# Patient Record
Sex: Female | Born: 1950 | Race: White | Hispanic: No | Marital: Married | Smoking: Current every day smoker
Health system: Southern US, Community
[De-identification: ages and names within clinical notes are randomized; demographics above are authoritative.]

## PROBLEM LIST (undated history)

## (undated) DIAGNOSIS — Z8739 Personal history of other diseases of the musculoskeletal system and connective tissue: Secondary | ICD-10-CM

## (undated) DIAGNOSIS — M1990 Unspecified osteoarthritis, unspecified site: Secondary | ICD-10-CM

## (undated) DIAGNOSIS — R195 Other fecal abnormalities: Secondary | ICD-10-CM

## (undated) DIAGNOSIS — F419 Anxiety disorder, unspecified: Secondary | ICD-10-CM

## (undated) DIAGNOSIS — F410 Panic disorder [episodic paroxysmal anxiety] without agoraphobia: Secondary | ICD-10-CM

## (undated) DIAGNOSIS — G43909 Migraine, unspecified, not intractable, without status migrainosus: Secondary | ICD-10-CM

## (undated) DIAGNOSIS — F32A Depression, unspecified: Secondary | ICD-10-CM

## (undated) DIAGNOSIS — I10 Essential (primary) hypertension: Secondary | ICD-10-CM

## (undated) DIAGNOSIS — L4050 Arthropathic psoriasis, unspecified: Secondary | ICD-10-CM

## (undated) DIAGNOSIS — L438 Other lichen planus: Secondary | ICD-10-CM

## (undated) DIAGNOSIS — J309 Allergic rhinitis, unspecified: Secondary | ICD-10-CM

## (undated) DIAGNOSIS — H9319 Tinnitus, unspecified ear: Secondary | ICD-10-CM

## (undated) DIAGNOSIS — A048 Other specified bacterial intestinal infections: Secondary | ICD-10-CM

## (undated) DIAGNOSIS — F329 Major depressive disorder, single episode, unspecified: Secondary | ICD-10-CM

## (undated) DIAGNOSIS — J342 Deviated nasal septum: Secondary | ICD-10-CM

## (undated) DIAGNOSIS — E785 Hyperlipidemia, unspecified: Secondary | ICD-10-CM

## (undated) DIAGNOSIS — M0520 Rheumatoid vasculitis with rheumatoid arthritis of unspecified site: Secondary | ICD-10-CM

## (undated) DIAGNOSIS — I219 Acute myocardial infarction, unspecified: Secondary | ICD-10-CM

## (undated) DIAGNOSIS — Z87898 Personal history of other specified conditions: Secondary | ICD-10-CM

## (undated) DIAGNOSIS — L409 Psoriasis, unspecified: Secondary | ICD-10-CM

## (undated) DIAGNOSIS — M810 Age-related osteoporosis without current pathological fracture: Secondary | ICD-10-CM

## (undated) DIAGNOSIS — T7840XA Allergy, unspecified, initial encounter: Secondary | ICD-10-CM

## (undated) HISTORY — DX: Unspecified osteoarthritis, unspecified site: M19.90

## (undated) HISTORY — DX: Hyperlipidemia, unspecified: E78.5

## (undated) HISTORY — DX: Migraine, unspecified, not intractable, without status migrainosus: G43.909

## (undated) HISTORY — DX: Rheumatoid vasculitis with rheumatoid arthritis of unspecified site: M05.20

## (undated) HISTORY — DX: Anxiety disorder, unspecified: F41.9

## (undated) HISTORY — DX: Other specified bacterial intestinal infections: A04.8

## (undated) HISTORY — DX: Arthropathic psoriasis, unspecified: L40.50

## (undated) HISTORY — DX: Allergy, unspecified, initial encounter: T78.40XA

## (undated) HISTORY — DX: Major depressive disorder, single episode, unspecified: F32.9

## (undated) HISTORY — DX: Personal history of other specified conditions: Z87.898

## (undated) HISTORY — DX: Essential (primary) hypertension: I10

## (undated) HISTORY — PX: CARDIAC CATHETERIZATION: SHX172

## (undated) HISTORY — DX: Panic disorder (episodic paroxysmal anxiety): F41.0

## (undated) HISTORY — DX: Age-related osteoporosis without current pathological fracture: M81.0

## (undated) HISTORY — PX: APPENDECTOMY: SHX54

## (undated) HISTORY — DX: Other fecal abnormalities: R19.5

## (undated) HISTORY — DX: Depression, unspecified: F32.A

## (undated) HISTORY — PX: BREAST BIOPSY: SHX20

## (undated) HISTORY — DX: Personal history of other diseases of the musculoskeletal system and connective tissue: Z87.39

## (undated) HISTORY — PX: ABDOMINOPLASTY: SUR9

## (undated) HISTORY — DX: Hemochromatosis, unspecified: E83.119

---

## 1981-06-17 HISTORY — PX: ABDOMINAL HYSTERECTOMY: SHX81

## 2000-06-17 HISTORY — PX: RHINOPLASTY: SUR1284

## 2004-07-31 DIAGNOSIS — L409 Psoriasis, unspecified: Secondary | ICD-10-CM | POA: Insufficient documentation

## 2004-09-25 ENCOUNTER — Ambulatory Visit: Payer: Self-pay | Admitting: Family Medicine

## 2004-11-19 DIAGNOSIS — J309 Allergic rhinitis, unspecified: Secondary | ICD-10-CM | POA: Insufficient documentation

## 2004-12-07 DIAGNOSIS — G43909 Migraine, unspecified, not intractable, without status migrainosus: Secondary | ICD-10-CM | POA: Insufficient documentation

## 2006-03-17 HISTORY — PX: COLONOSCOPY: SHX174

## 2006-03-27 ENCOUNTER — Ambulatory Visit: Payer: Self-pay | Admitting: Gastroenterology

## 2006-03-27 LAB — HM COLONOSCOPY: HM COLON: NORMAL

## 2006-06-17 HISTORY — PX: UPPER GI ENDOSCOPY: SHX6162

## 2006-07-22 ENCOUNTER — Ambulatory Visit: Payer: Self-pay | Admitting: Family Medicine

## 2006-11-05 ENCOUNTER — Ambulatory Visit: Payer: Self-pay | Admitting: Gastroenterology

## 2006-11-05 DIAGNOSIS — B9681 Helicobacter pylori [H. pylori] as the cause of diseases classified elsewhere: Secondary | ICD-10-CM | POA: Insufficient documentation

## 2010-03-21 ENCOUNTER — Ambulatory Visit: Payer: Self-pay

## 2010-04-03 ENCOUNTER — Ambulatory Visit: Payer: Self-pay

## 2010-05-17 ENCOUNTER — Ambulatory Visit: Payer: Self-pay | Admitting: General Surgery

## 2011-01-22 ENCOUNTER — Ambulatory Visit: Payer: Self-pay | Admitting: General Surgery

## 2011-07-30 ENCOUNTER — Ambulatory Visit: Payer: Self-pay | Admitting: General Surgery

## 2012-04-29 ENCOUNTER — Ambulatory Visit: Payer: Self-pay | Admitting: Family Medicine

## 2012-04-29 LAB — HM DEXA SCAN

## 2012-05-11 DIAGNOSIS — L438 Other lichen planus: Secondary | ICD-10-CM | POA: Insufficient documentation

## 2012-07-31 ENCOUNTER — Ambulatory Visit: Payer: Self-pay | Admitting: Family Medicine

## 2012-08-04 ENCOUNTER — Ambulatory Visit: Payer: Self-pay | Admitting: Family Medicine

## 2013-08-23 ENCOUNTER — Ambulatory Visit: Payer: Self-pay | Admitting: Family Medicine

## 2013-08-23 LAB — HM MAMMOGRAPHY

## 2013-08-26 ENCOUNTER — Ambulatory Visit: Payer: Self-pay | Admitting: Family Medicine

## 2013-10-18 ENCOUNTER — Ambulatory Visit: Payer: Self-pay | Admitting: Internal Medicine

## 2013-10-18 LAB — CBC CANCER CENTER
Basophil #: 0.2 x10 3/mm — ABNORMAL HIGH (ref 0.0–0.1)
Basophil %: 1.7 %
EOS ABS: 0.2 x10 3/mm (ref 0.0–0.7)
EOS PCT: 1.8 %
HCT: 43 % (ref 35.0–47.0)
HGB: 14.6 g/dL (ref 12.0–16.0)
LYMPHS PCT: 28.7 %
Lymphocyte #: 2.8 x10 3/mm (ref 1.0–3.6)
MCH: 33.4 pg (ref 26.0–34.0)
MCHC: 34 g/dL (ref 32.0–36.0)
MCV: 98 fL (ref 80–100)
Monocyte #: 0.7 x10 3/mm (ref 0.2–0.9)
Monocyte %: 7.2 %
NEUTROS ABS: 5.9 x10 3/mm (ref 1.4–6.5)
Neutrophil %: 60.6 %
Platelet: 255 x10 3/mm (ref 150–440)
RBC: 4.37 10*6/uL (ref 3.80–5.20)
RDW: 12.3 % (ref 11.5–14.5)
WBC: 9.8 x10 3/mm (ref 3.6–11.0)

## 2013-10-19 LAB — AFP TUMOR MARKER: AFP TUMOR MARKER: 1.9 ng/mL (ref 0.0–8.3)

## 2013-11-15 ENCOUNTER — Ambulatory Visit: Payer: Self-pay | Admitting: Internal Medicine

## 2013-11-24 LAB — CANCER CENTER HEMOGLOBIN: HGB: 14 g/dL (ref 12.0–16.0)

## 2013-12-15 ENCOUNTER — Ambulatory Visit: Payer: Self-pay | Admitting: Internal Medicine

## 2014-01-05 LAB — CANCER CENTER HEMOGLOBIN: HGB: 14 g/dL (ref 12.0–16.0)

## 2014-01-05 LAB — FERRITIN: FERRITIN (ARMC): 115 ng/mL (ref 8–388)

## 2014-01-15 ENCOUNTER — Ambulatory Visit: Payer: Self-pay | Admitting: Internal Medicine

## 2014-02-14 LAB — CANCER CENTER HEMOGLOBIN: HGB: 14.5 g/dL (ref 12.0–16.0)

## 2014-02-15 ENCOUNTER — Ambulatory Visit: Payer: Self-pay | Admitting: Internal Medicine

## 2014-03-14 LAB — CANCER CENTER HEMOGLOBIN: HGB: 14.8 g/dL (ref 12.0–16.0)

## 2014-03-14 LAB — FERRITIN: Ferritin (ARMC): 71 ng/mL (ref 8–388)

## 2014-03-17 ENCOUNTER — Ambulatory Visit: Payer: Self-pay | Admitting: Internal Medicine

## 2014-04-13 LAB — CANCER CENTER HEMOGLOBIN: HGB: 14 g/dL (ref 12.0–16.0)

## 2014-04-17 ENCOUNTER — Ambulatory Visit: Payer: Self-pay | Admitting: Internal Medicine

## 2014-06-22 ENCOUNTER — Ambulatory Visit: Payer: Self-pay | Admitting: Internal Medicine

## 2014-06-22 LAB — CANCER CENTER HEMOGLOBIN: HGB: 15.1 g/dL (ref 12.0–16.0)

## 2014-06-22 LAB — FERRITIN: Ferritin (ARMC): 33 ng/mL (ref 8–388)

## 2014-07-18 ENCOUNTER — Ambulatory Visit: Payer: Self-pay | Admitting: Internal Medicine

## 2014-08-17 ENCOUNTER — Ambulatory Visit
Admit: 2014-08-17 | Disposition: A | Discharge status: Home / Self Care | Payer: Self-pay | Attending: Internal Medicine | Admitting: Internal Medicine

## 2014-09-05 LAB — BASIC METABOLIC PANEL
BUN: 18 mg/dL (ref 4–21)
Creatinine: 0.6 mg/dL (ref <=1.1)
GLUCOSE: 102 mg/dL
Potassium: 5.1 mmol/L (ref 3.4–5.3)
SODIUM: 138 mmol/L (ref 137–147)

## 2014-09-05 LAB — HEPATIC FUNCTION PANEL
ALT: 16 U/L (ref 7–35)
AST: 11 U/L — AB (ref 13–35)

## 2014-09-14 LAB — FERRITIN: Ferritin (ARMC): 34 ng/mL

## 2014-09-14 LAB — CANCER CENTER HEMOGLOBIN: HGB: 15 g/dL (ref 12.0–16.0)

## 2014-09-16 ENCOUNTER — Ambulatory Visit
Admit: 2014-09-16 | Disposition: A | Discharge status: Home / Self Care | Payer: Self-pay | Attending: Internal Medicine | Admitting: Internal Medicine

## 2014-10-12 LAB — CANCER CENTER HEMOGLOBIN: HGB: 14.3 g/dL (ref 12.0–16.0)

## 2014-11-02 DIAGNOSIS — F339 Major depressive disorder, recurrent, unspecified: Secondary | ICD-10-CM | POA: Insufficient documentation

## 2014-11-02 DIAGNOSIS — E7800 Pure hypercholesterolemia, unspecified: Secondary | ICD-10-CM | POA: Insufficient documentation

## 2014-11-02 DIAGNOSIS — G4700 Insomnia, unspecified: Secondary | ICD-10-CM | POA: Insufficient documentation

## 2014-11-02 DIAGNOSIS — Z8349 Family history of other endocrine, nutritional and metabolic diseases: Secondary | ICD-10-CM | POA: Insufficient documentation

## 2014-11-02 DIAGNOSIS — M810 Age-related osteoporosis without current pathological fracture: Secondary | ICD-10-CM | POA: Insufficient documentation

## 2014-11-02 DIAGNOSIS — Z720 Tobacco use: Secondary | ICD-10-CM | POA: Insufficient documentation

## 2014-11-02 DIAGNOSIS — I10 Essential (primary) hypertension: Secondary | ICD-10-CM | POA: Insufficient documentation

## 2014-11-28 ENCOUNTER — Other Ambulatory Visit: Payer: Self-pay

## 2014-11-28 ENCOUNTER — Ambulatory Visit: Payer: Self-pay | Admitting: Internal Medicine

## 2014-11-29 ENCOUNTER — Other Ambulatory Visit: Payer: Self-pay | Admitting: Family Medicine

## 2014-11-29 DIAGNOSIS — G4700 Insomnia, unspecified: Secondary | ICD-10-CM

## 2014-11-29 MED ORDER — ESZOPICLONE 2 MG PO TABS: 2.0000 mg | ORAL_TABLET | Freq: Every day | ORAL | Status: DC

## 2014-11-29 NOTE — Telephone Encounter (Signed)
Please fax to [facility] .Thanks

## 2014-11-29 NOTE — Telephone Encounter (Signed)
Pt contacted office for refill request on the following medications:eszopiclone (LUNESTA) 2 MG Pt is requesting a week worth called to local pharmacy.  Pt is going out of town and the mail order will not be here before she leaves.  [place].  [phone]

## 2014-12-01 ENCOUNTER — Other Ambulatory Visit: Payer: Self-pay

## 2014-12-01 ENCOUNTER — Ambulatory Visit: Payer: Self-pay | Admitting: Family Medicine

## 2014-12-07 ENCOUNTER — Ambulatory Visit: Payer: Self-pay | Admitting: Family Medicine

## 2014-12-07 ENCOUNTER — Other Ambulatory Visit: Payer: Self-pay

## 2014-12-14 ENCOUNTER — Inpatient Hospital Stay: Payer: BLUE CROSS/BLUE SHIELD | Admitting: Family Medicine

## 2014-12-14 ENCOUNTER — Encounter: Payer: Self-pay | Admitting: Family Medicine

## 2014-12-14 ENCOUNTER — Inpatient Hospital Stay: Payer: BLUE CROSS/BLUE SHIELD

## 2014-12-14 ENCOUNTER — Inpatient Hospital Stay: Payer: BLUE CROSS/BLUE SHIELD | Attending: Family Medicine

## 2014-12-14 HISTORY — DX: Hemochromatosis, unspecified: E83.119

## 2014-12-14 LAB — HEMOGLOBIN: HEMOGLOBIN: 15.2 g/dL (ref 12.0–16.0)

## 2014-12-14 LAB — FERRITIN: Ferritin: 39 ng/mL (ref 11–307)

## 2014-12-15 LAB — AFP TUMOR MARKER: AFP-Tumor Marker: 2.7 ng/mL (ref 0.0–8.3)

## 2014-12-21 ENCOUNTER — Other Ambulatory Visit: Payer: Self-pay | Admitting: Family Medicine

## 2014-12-21 DIAGNOSIS — G43809 Other migraine, not intractable, without status migrainosus: Secondary | ICD-10-CM

## 2014-12-21 NOTE — Telephone Encounter (Signed)
OK to call in rx. Thanks.

## 2014-12-28 NOTE — Progress Notes (Signed)
This encounter was created in error - please disregard.

## 2015-01-04 ENCOUNTER — Ambulatory Visit (INDEPENDENT_AMBULATORY_CARE_PROVIDER_SITE_OTHER): Payer: BLUE CROSS/BLUE SHIELD | Admitting: Family Medicine

## 2015-01-04 ENCOUNTER — Encounter: Payer: Self-pay | Admitting: Family Medicine

## 2015-01-04 VITALS — BP 140/84 | HR 76 | Temp 97.9°F | Resp 20 | Ht 61.0 in | Wt 104.0 lb

## 2015-01-04 DIAGNOSIS — R319 Hematuria, unspecified: Secondary | ICD-10-CM | POA: Diagnosis not present

## 2015-01-04 DIAGNOSIS — E7800 Pure hypercholesterolemia, unspecified: Secondary | ICD-10-CM

## 2015-01-04 DIAGNOSIS — Z1239 Encounter for other screening for malignant neoplasm of breast: Secondary | ICD-10-CM

## 2015-01-04 DIAGNOSIS — I10 Essential (primary) hypertension: Secondary | ICD-10-CM

## 2015-01-04 DIAGNOSIS — Z0000 Encounter for general adult medical examination without abnormal findings: Secondary | ICD-10-CM | POA: Diagnosis not present

## 2015-01-04 DIAGNOSIS — Z1211 Encounter for screening for malignant neoplasm of colon: Secondary | ICD-10-CM

## 2015-01-04 DIAGNOSIS — Z720 Tobacco use: Secondary | ICD-10-CM

## 2015-01-04 LAB — POCT URINALYSIS DIPSTICK
Bilirubin, UA: NEGATIVE
Glucose, UA: NEGATIVE
KETONES UA: NEGATIVE
Nitrite, UA: POSITIVE
PH UA: 5
Protein, UA: NEGATIVE
Spec Grav, UA: 1.025
UROBILINOGEN UA: 0.2

## 2015-01-04 LAB — IFOBT (OCCULT BLOOD): IMMUNOLOGICAL FECAL OCCULT BLOOD TEST: NEGATIVE

## 2015-01-04 MED ORDER — VARENICLINE TARTRATE 0.5 MG X 11 & 1 MG X 42 PO MISC: ORAL | Status: DC

## 2015-01-04 NOTE — Progress Notes (Signed)
Patient ID: April Best, female   DOB: 1950-08-07, 64 y.o.   MRN: [id number]       Patient: April Best, Female    DOB: December 18, 1950, 64 y.o.   MRN: [id number] Visit Date: 01/04/2015  Today's Provider: Margarita Rana, MD   Chief Complaint  Patient presents with  . Annual Exam   Subjective:    Annual physical exam April Best is a 64 y.o. female who presents today for health maintenance and complete physical. She feels well. She reports she is not exercising, but stays active with house and yard work. She reports she is sleeping well 8 hours. 11/04/13 CPE 02/21/10 Pap-neg 08/26/13 Mammogram-BI-RADS 1 03/27/06 Colonoscopy-Normal 04/29/12 BMD-Osteoporosis   Results for orders placed or performed in visit on 01/04/15  POCT urinalysis dipstick  Result Value Ref Range   Color, UA straw    Clarity, UA clrear    Glucose, UA neg    Bilirubin, UA neg    Ketones, UA neg    Spec Grav, UA 1.025    Blood, UA non hemolized moderate    pH, UA 5.0    Protein, UA neg    Urobilinogen, UA 0.2    Nitrite, UA pos    Leukocytes, UA small (1+) (A) Negative  IFOBT POC (occult bld, rslt in office)  Result Value Ref Range   IFOBT Negative      Lab Results  Component Value Date   WBC 9.8 10/18/2013   HGB 15.2 12/14/2014   HCT 43.0 10/18/2013   PLT 255 10/18/2013   ALT 16 09/05/2014   AST 11* 09/05/2014   NA 138 09/05/2014   K 5.1 09/05/2014   CREATININE 0.6 09/05/2014   BUN 18 09/05/2014     -----------------------------------------------------------------   Review of Systems  Constitutional: Negative.   HENT: Negative.   Eyes: Negative.   Respiratory: Negative.   Cardiovascular: Negative.   Gastrointestinal: Negative.   Endocrine: Negative.   Genitourinary: Negative.   Musculoskeletal: Negative.   Skin: Negative.   Allergic/Immunologic: Negative.   Neurological: Negative.   Hematological: Negative.   Psychiatric/Behavioral: Negative.     Social  History She  reports that she has been smoking.  She has never used smokeless tobacco. She reports that she drinks alcohol. She reports that she does not use illicit drugs.  Patient Active Problem List   Diagnosis Date Noted  . 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 Recurrent major depressive episodes 11/02/2014  . Family history of hemochromatosis 11/02/2014  . Hypercholesteremia 11/02/2014  . Benign hypertension 11/02/2014  . Cannot sleep 11/02/2014  . OP (osteoporosis) 11/02/2014  . Current tobacco use 11/02/2014  . 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 Episodic paroxysmal anxiety disorder 10/02/2007  . Gastrointestinal ulcer due to Helicobacter pylori 75/91/6384  . Headache, migraine 12/07/2004  . Allergic rhinitis 11/19/2004  . Psoriasis 07/31/2004    Past Surgical History  Procedure Laterality Date  . Rhinoplasty  2002  . Abdominal hysterectomy  1983    Ovaries have been removed.  Marland Kitchen Appendectomy    . Abdominoplasty      Family History Her family history includes ADD / ADHD in her brother; Coronary artery disease in her father; Fibromyalgia in her mother and sister; Heart attack in her father; Hyperlipidemia in her father, mother, and sister; Hypertension in her father, mother, and sister; Hypothyroidism in her mother; Lung cancer in her father.    Allergies  Allergen Reactions  . Augmentin  [Amoxicillin-Pot Clavulanate]     as stated (  XR).  . Lisinopril Cough    Previous Medications   ALPRAZOLAM (XANAX) 0.5 MG TABLET    Take 0.5 mg by mouth at bedtime as needed.    BUPROPION (WELLBUTRIN SR) 150 MG 12 HR TABLET    Take 150 mg by mouth. As needed   BUTALBITAL-ACETAMINOPHEN-CAFFEINE (FIORICET WITH CODEINE) 50-325-40-30 MG PER CAPSULE    TAKE 1 TO 2 CAPSULES BY MOUTH EVERY 4 HOURS AS NEEDED   ESCITALOPRAM (LEXAPRO) 20 MG TABLET    Take 20 mg by mouth daily.    ESZOPICLONE (LUNESTA) 2 MG TABS TABLET    Take 1 tablet (2 mg total) by mouth at bedtime.   FEXOFENADINE (ALLEGRA) 180 MG  TABLET    Take 1 tablet by mouth as needed.   FLUTICASONE (FLONASE) 50 MCG/ACT NASAL SPRAY    Place 1 spray into the nose. As needed    Patient Care Team: Margarita Rana, MD as PCP - General (Family Medicine)     Objective:   Vitals: BP 140/84 mmHg  Pulse 76  Temp(Src) 97.9 F (36.6 C) (Oral)  Resp 20  Ht 5\' 1"  (1.549 m)  Wt 104 lb (47.174 kg)  BMI 19.66 kg/m2   Physical Exam  Constitutional: She is oriented to person, place, and time. She appears well-developed and well-nourished.  HENT:  Head: Normocephalic and atraumatic.  Right Ear: Tympanic membrane, external ear and ear canal normal.  Left Ear: Tympanic membrane, external ear and ear canal normal.  Nose: Nose normal.  Mouth/Throat: Uvula is midline, oropharynx is clear and moist and mucous membranes are normal.  Eyes: Conjunctivae, EOM and lids are normal. Pupils are equal, round, and reactive to light.  Neck: Trachea normal and normal range of motion. Neck supple. Carotid bruit is not present. No thyroid mass and no thyromegaly present.  Cardiovascular: Normal rate, regular rhythm and normal heart sounds.   Pulmonary/Chest: Effort normal and breath sounds normal.  Abdominal: Soft. Normal appearance and bowel sounds are normal. There is no hepatosplenomegaly. There is no tenderness.  Genitourinary: No breast swelling, tenderness or discharge.  Musculoskeletal: Normal range of motion.  Lymphadenopathy:    She has no cervical adenopathy.    She has no axillary adenopathy.  Neurological: She is alert and oriented to person, place, and time. She has normal strength. No cranial nerve deficit.  Skin: Skin is warm, dry and intact.  Psychiatric: She has a normal mood and affect. Her speech is normal and behavior is normal. Judgment and thought content normal. Cognition and memory are normal.     Depression Screen PHQ 2/9 Scores 01/04/2015  PHQ - 2 Score 0      Assessment & Plan:     Routine Health Maintenance and  Physical Exam  Exercise Activities and Dietary recommendations Goals    . Exercise 150 minutes per week (moderate activity)    . Quit smoking / using tobacco       Immunization History  Administered Date(s) Administered  . Pneumococcal Polysaccharide-23 04/22/2012  . Tdap 02/21/2010  . Zoster 11/04/2013    Health Maintenance  Topic Date Due  . HIV Screening  12/03/1966  . PAP SMEAR  12/02/1969  . INFLUENZA VACCINE  01/16/2015  . MAMMOGRAM  08/24/2015  . COLONOSCOPY  03/27/2016  . TETANUS/TDAP  02/22/2020  . ZOSTAVAX  Completed       1. Annual physical exam Stable. Patient advised to continue eating healthy and exercise daily. - POCT urinalysis dipstick  2. Hemochromatosis Patient will follow up  with Hem/onc. May take over care here. Will discuss further at follow up. - CBC with Differential/Platelet - Ferritin - TSH  3. Current tobacco use Patient started on Chantix 0.5 mg. Recheck in 6 weeks.  Discussed importance of follow up and quitting smoking.  Think it is causing her hemoglobin to be increased.   4. Benign hypertension - Comprehensive metabolic panel  5. Hypercholesteremia - Lipid Panel With LDL/HDL Ratio  6. Colon cancer screening - IFOBT POC (occult bld, rslt in office)  7. Breast cancer screening - MM DIGITAL SCREENING BILATERAL; Future  8. Hematuria F/U pending lab report. - Urine Microscopic     Patient seen and examined by Dr. Jerrell Belfast, and note scribed by Philbert Riser. Dimas, CMA. I have reviewed the document for accuracy and completeness and I agree with above. Jerrell Belfast, MD   Margarita Rana, MD       --------------------------------------------------------------------

## 2015-01-05 ENCOUNTER — Telehealth: Payer: Self-pay

## 2015-01-05 DIAGNOSIS — E7800 Pure hypercholesterolemia, unspecified: Secondary | ICD-10-CM

## 2015-01-05 LAB — LIPID PANEL WITH LDL/HDL RATIO
Cholesterol, Total: 301 mg/dL — ABNORMAL HIGH (ref 100–199)
HDL: 74 mg/dL
LDL Calculated: 198 mg/dL — ABNORMAL HIGH (ref 0–99)
LDl/HDL Ratio: 2.7 ratio (ref 0.0–3.2)
Triglycerides: 146 mg/dL (ref 0–149)
VLDL Cholesterol Cal: 29 mg/dL (ref 5–40)

## 2015-01-05 LAB — CBC WITH DIFFERENTIAL/PLATELET
BASOS ABS: 0 10*3/uL (ref 0.0–0.2)
BASOS: 0 %
EOS (ABSOLUTE): 0.1 10*3/uL (ref 0.0–0.4)
Eos: 1 %
HEMOGLOBIN: 14.7 g/dL (ref 11.1–15.9)
Hematocrit: 44.6 % (ref 34.0–46.6)
IMMATURE GRANULOCYTES: 0 %
Immature Grans (Abs): 0 10*3/uL (ref 0.0–0.1)
LYMPHS: 27 %
Lymphocytes Absolute: 2.5 10*3/uL (ref 0.7–3.1)
MCH: 33 pg (ref 26.6–33.0)
MCHC: 33 g/dL (ref 31.5–35.7)
MCV: 100 fL — AB (ref 79–97)
Monocytes Absolute: 0.9 10*3/uL (ref 0.1–0.9)
Monocytes: 10 %
NEUTROS ABS: 5.7 10*3/uL (ref 1.4–7.0)
Neutrophils: 62 %
Platelets: 405 10*3/uL — ABNORMAL HIGH (ref 150–379)
RBC: 4.45 x10E6/uL (ref 3.77–5.28)
RDW: 13.8 % (ref 12.3–15.4)
WBC: 9.2 10*3/uL (ref 3.4–10.8)

## 2015-01-05 LAB — COMPREHENSIVE METABOLIC PANEL WITH GFR
ALT: 22 IU/L (ref 0–32)
AST: 21 IU/L (ref 0–40)
Albumin/Globulin Ratio: 2.6 — ABNORMAL HIGH (ref 1.1–2.5)
Albumin: 5.1 g/dL — ABNORMAL HIGH (ref 3.6–4.8)
Alkaline Phosphatase: 76 IU/L (ref 39–117)
BUN/Creatinine Ratio: 23 (ref 11–26)
BUN: 17 mg/dL (ref 8–27)
Bilirubin Total: 0.3 mg/dL (ref 0.0–1.2)
CO2: 25 mmol/L (ref 18–29)
Calcium: 9.9 mg/dL (ref 8.7–10.3)
Chloride: 98 mmol/L (ref 97–108)
Creatinine, Ser: 0.73 mg/dL (ref 0.57–1.00)
GFR calc Af Amer: 101 mL/min/1.73
GFR calc non Af Amer: 88 mL/min/1.73
Globulin, Total: 2 g/dL (ref 1.5–4.5)
Glucose: 109 mg/dL — ABNORMAL HIGH (ref 65–99)
Potassium: 5.3 mmol/L — ABNORMAL HIGH (ref 3.5–5.2)
Sodium: 140 mmol/L (ref 134–144)
Total Protein: 7.1 g/dL (ref 6.0–8.5)

## 2015-01-05 LAB — FERRITIN: Ferritin: 33 ng/mL (ref 15–150)

## 2015-01-05 LAB — URINALYSIS, MICROSCOPIC ONLY
Casts: NONE SEEN /lpf
WBC, UA: >30 /hpf — AB (ref 0–?)

## 2015-01-05 LAB — TSH: TSH: 0.583 u[IU]/mL (ref 0.450–4.500)

## 2015-01-05 MED ORDER — PRAVASTATIN SODIUM 20 MG PO TABS: 20.0000 mg | ORAL_TABLET | Freq: Every day | ORAL | Status: DC

## 2015-01-05 NOTE — Telephone Encounter (Signed)
Sent in Pravastatin. Would like her to try it. Can check labs at follow up.  Thanks.

## 2015-01-05 NOTE — Telephone Encounter (Signed)
Pt advised.  She stated she didn't tolerate Lipitor; she did well on Crestor but it was too expensive.  She has not tried pravastatin or simvastatin yet.   Thanks,   -Mickel Baas

## 2015-01-05 NOTE — Telephone Encounter (Signed)
-----   Message from Margarita Rana, MD sent at 01/05/2015  4:29 PM EDT ----- Potassium and glucose elevated. Recheck fasting in 4 weeks. Ferritin 33.  Cholesterol very high at 301. Know patient took cholesterol medication in the past. Please clarify why stopped.  Thanks.

## 2015-01-06 ENCOUNTER — Telehealth: Payer: Self-pay | Admitting: Family Medicine

## 2015-01-06 NOTE — Telephone Encounter (Signed)
Pt is returning call.  CB#[phone]/MW

## 2015-01-06 NOTE — Telephone Encounter (Signed)
LMTCB 01/06/2015  Thanks,   -Laura  

## 2015-01-06 NOTE — Telephone Encounter (Signed)
Spoke with pt regarding starting pravastatin.   Thanks,   -Mickel Baas

## 2015-01-25 ENCOUNTER — Inpatient Hospital Stay: Payer: BLUE CROSS/BLUE SHIELD | Attending: Family Medicine

## 2015-01-25 ENCOUNTER — Inpatient Hospital Stay: Payer: BLUE CROSS/BLUE SHIELD

## 2015-01-25 ENCOUNTER — Other Ambulatory Visit: Payer: Self-pay | Admitting: Family Medicine

## 2015-01-25 LAB — HEMATOCRIT: HCT: 41.7 % (ref 35.0–47.0)

## 2015-02-10 ENCOUNTER — Telehealth: Payer: Self-pay | Admitting: Family Medicine

## 2015-02-10 DIAGNOSIS — R7309 Other abnormal glucose: Secondary | ICD-10-CM

## 2015-02-10 DIAGNOSIS — I10 Essential (primary) hypertension: Secondary | ICD-10-CM

## 2015-02-10 DIAGNOSIS — E7800 Pure hypercholesterolemia, unspecified: Secondary | ICD-10-CM

## 2015-02-10 NOTE — Telephone Encounter (Signed)
Pt stated she is due to have her labs rechecked because last time her potassium and sugar levels were elevated. Pt stated that when she received the call with the results she was advised to call to get a lab slip. Thanks TNP

## 2015-02-10 NOTE — Telephone Encounter (Signed)
Please review. Thanks!  

## 2015-02-10 NOTE — Telephone Encounter (Signed)
Ok to print out lab slip. Thanks.  

## 2015-02-13 DIAGNOSIS — R7309 Other abnormal glucose: Secondary | ICD-10-CM | POA: Insufficient documentation

## 2015-02-15 ENCOUNTER — Ambulatory Visit: Payer: BLUE CROSS/BLUE SHIELD | Admitting: Family Medicine

## 2015-02-15 NOTE — Telephone Encounter (Signed)
Patient aware to pick up lab slip at front desk. sd

## 2015-03-09 ENCOUNTER — Inpatient Hospital Stay: Payer: BLUE CROSS/BLUE SHIELD

## 2015-03-09 ENCOUNTER — Ambulatory Visit: Payer: BLUE CROSS/BLUE SHIELD

## 2015-03-09 ENCOUNTER — Inpatient Hospital Stay: Payer: BLUE CROSS/BLUE SHIELD | Admitting: Internal Medicine

## 2015-03-22 ENCOUNTER — Encounter: Payer: Self-pay | Admitting: *Deleted

## 2015-03-23 ENCOUNTER — Inpatient Hospital Stay: Payer: BLUE CROSS/BLUE SHIELD

## 2015-03-23 ENCOUNTER — Encounter: Payer: Self-pay | Admitting: Internal Medicine

## 2015-03-23 ENCOUNTER — Inpatient Hospital Stay: Payer: BLUE CROSS/BLUE SHIELD | Attending: Internal Medicine | Admitting: Internal Medicine

## 2015-03-23 VITALS — BP 128/70 | HR 65 | Temp 99.0°F | Resp 18 | Ht 61.0 in | Wt 105.6 lb

## 2015-03-23 DIAGNOSIS — F410 Panic disorder [episodic paroxysmal anxiety] without agoraphobia: Secondary | ICD-10-CM | POA: Diagnosis not present

## 2015-03-23 DIAGNOSIS — Z23 Encounter for immunization: Secondary | ICD-10-CM | POA: Diagnosis not present

## 2015-03-23 DIAGNOSIS — I10 Essential (primary) hypertension: Secondary | ICD-10-CM | POA: Diagnosis not present

## 2015-03-23 DIAGNOSIS — F17210 Nicotine dependence, cigarettes, uncomplicated: Secondary | ICD-10-CM | POA: Diagnosis not present

## 2015-03-23 DIAGNOSIS — E785 Hyperlipidemia, unspecified: Secondary | ICD-10-CM | POA: Diagnosis not present

## 2015-03-23 DIAGNOSIS — Z79899 Other long term (current) drug therapy: Secondary | ICD-10-CM | POA: Insufficient documentation

## 2015-03-23 DIAGNOSIS — M810 Age-related osteoporosis without current pathological fracture: Secondary | ICD-10-CM | POA: Insufficient documentation

## 2015-03-23 LAB — HEMATOCRIT: HEMATOCRIT: 44.7 % (ref 35.0–47.0)

## 2015-03-23 MED ORDER — INFLUENZA VAC SPLIT QUAD 0.5 ML IM SUSY
0.5000 mL | PREFILLED_SYRINGE | Freq: Once | INTRAMUSCULAR | Status: AC
  Administered 2015-03-23: 0.5 mL via INTRAMUSCULAR
  Filled 2015-03-23: qty 0.5

## 2015-03-23 NOTE — Progress Notes (Signed)
Pt here for poss. Phlebotomy.  She has been having some sinsus pressure, and eye drainage-did advise her to take flonase spray she has ordered

## 2015-03-23 NOTE — Progress Notes (Signed)
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

## 2015-03-23 NOTE — Progress Notes (Signed)
No phlebotomy needed today. Influenza injection given only today.

## 2015-03-23 NOTE — Progress Notes (Signed)
[place] OFFICE PROGRESS NOTE  Patient Care Team: Margarita Rana, MD as PCP - General (Family Medicine)   SUMMARY OF HEMATOLOGIC/ONCOLOGIC HISTORY:  # HEMOCHROMATOSIS [screening- C282y & H63d] on Phlebotomy q Monthly  INTERVAL HISTORY:  A very pleasant 64 year old female patient with above history of  [name] following screening   Without any end organ dysfunction is here for follow-up /phlebotomy.   Patient denies any unusual abdominal pain nausea vomiting discoloration of the skin or  Swelling in the legs. No unusual weight loss.   Patient does not  Feel subjectively any better after the phlebotomy. She in fact feels tired after the phlebotomy.  REVIEW OF SYSTEMS:  A complete 10 point review of system is done which is negative except mentioned above/history of present illness.   PAST MEDICAL HISTORY :  Past Medical History  Diagnosis Date  . 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 Allergy   . Anxiety   . Depression   . Hypertension   . Hyperlipidemia   . Osteoporosis   . Migraine   . H. pylori infection   . History of palpitations   . Panic disorder   . Guaiac positive stools     history    PAST SURGICAL HISTORY :   Past Surgical History  Procedure Laterality Date  . Rhinoplasty  2002  . Abdominal hysterectomy  1983    Ovaries have been removed.  Marland Kitchen Appendectomy    . Abdominoplasty    . Upper gi endoscopy  2008  . Colonoscopy  03/2006    FAMILY HISTORY :   Family History  Problem Relation Age of Onset  . Hypertension Mother   . Hyperlipidemia Mother   . Hypothyroidism Mother   . Fibromyalgia Mother   . Lung cancer Father   . Coronary artery disease Father   . Hyperlipidemia Father   . Hypertension Father   . Heart attack Father   . Hyperlipidemia Sister   . Hypertension Sister   . Fibromyalgia Sister   . ADD / ADHD Brother   . Hemochromatosis Sister     SOCIAL HISTORY:   Social History  Substance Use Topics  . Smoking  status: Current Every Day Smoker -- 0.50 packs/day for 40 years    Types: Cigarettes  . Smokeless tobacco: Never Used  . Alcohol Use: 0.0 oz/week    0 Standard drinks or equivalent per week     Comment: Three glasses of per month    ALLERGIES:  is allergic to augmentin  and lisinopril.  MEDICATIONS:  Current Outpatient Prescriptions  Medication Sig Dispense Refill  . ALPRAZolam (XANAX) 0.5 MG tablet Take 0.5 mg by mouth at bedtime as needed.     Marland Kitchen buPROPion (WELLBUTRIN SR) 150 MG 12 hr tablet Take 150 mg by mouth. As needed    . butalbital-acetaminophen-caffeine (FIORICET WITH CODEINE) 50-325-40-30 MG per capsule TAKE 1 TO 2 CAPSULES BY MOUTH EVERY 4 HOURS AS NEEDED 60 capsule 0  . escitalopram (LEXAPRO) 20 MG tablet Take 20 mg by mouth daily.     . eszopiclone (LUNESTA) 2 MG TABS tablet Take 1 tablet (2 mg total) by mouth at bedtime. (Patient taking differently: Take by mouth at bedtime. ) 7 tablet 0  . fexofenadine (ALLEGRA) 180 MG tablet Take 1 tablet by mouth as needed.    . fluticasone (FLONASE) 50 MCG/ACT nasal spray Place 1 spray into the nose. As needed    . pravastatin (PRAVACHOL) 20 MG tablet Take 1 tablet (20 mg total)  by mouth daily. 90 tablet 3   Current Facility-Administered Medications  Medication Dose Route Frequency Provider Last Rate Last Dose  . Influenza vac split quadrivalent PF (FLUARIX) injection 0.5 mL  0.5 mL Intramuscular Once Cammie Sickle, MD        PHYSICAL EXAMINATION: ECOG PERFORMANCE STATUS: 0 - Asymptomatic  BP 128/70 mmHg  Pulse 65  Temp(Src) 99 F (37.2 C) (Tympanic)  Resp 18  Ht 5\' 1"  (1.549 m)  Wt 105 lb 9.6 oz (47.9 kg)  BMI 19.96 kg/m2  Filed Weights   03/23/15 1409  Weight: 105 lb 9.6 oz (47.9 kg)    GENERAL: Well-nourished well-developed; Alert, no distress and comfortable.   She is alone. EYES: no pallor or icterus OROPHARYNX: no thrush or ulceration; good dentition  NECK: supple, no masses felt LYMPH:  no palpable  lymphadenopathy in the cervical, axillary or inguinal regions LUNGS: clear to auscultation and  No wheeze or crackles HEART/CVS: regular rate & rhythm and no murmurs; No lower extremity edema ABDOMEN:abdomen soft, non-tender and normal bowel sounds Musculoskeletal:no cyanosis of digits and no clubbing  PSYCH: alert & oriented x 3 with fluent speech NEURO: no focal motor/sensory deficits SKIN:  no rashes or significant lesions  LABORATORY DATA:  I have reviewed the data as listed    Component Value Date/Time   NA 140 01/04/2015 1142   K 5.3* 01/04/2015 1142   CL 98 01/04/2015 1142   CO2 25 01/04/2015 1142   GLUCOSE 109* 01/04/2015 1142   BUN 17 01/04/2015 1142   CREATININE 0.73 01/04/2015 1142   CREATININE 0.6 09/05/2014   CALCIUM 9.9 01/04/2015 1142   PROT 7.1 01/04/2015 1142   AST 21 01/04/2015 1142   ALT 22 01/04/2015 1142   ALKPHOS 76 01/04/2015 1142   BILITOT 0.3 01/04/2015 1142   GFRNONAA 88 01/04/2015 1142   GFRAA 101 01/04/2015 1142    No results found for: SPEP, UPEP  Lab Results  Component Value Date   WBC 9.2 01/04/2015   NEUTROABS 5.7 01/04/2015   HGB 15.2 12/14/2014   HCT 44.7 03/23/2015   MCV 98 10/18/2013   PLT 255 10/18/2013      Chemistry      Component Value Date/Time   NA 140 01/04/2015 1142   K 5.3* 01/04/2015 1142   CL 98 01/04/2015 1142   CO2 25 01/04/2015 1142   BUN 17 01/04/2015 1142   CREATININE 0.73 01/04/2015 1142   CREATININE 0.6 09/05/2014   GLU 102 09/05/2014      Component Value Date/Time   CALCIUM 9.9 01/04/2015 1142   ALKPHOS 76 01/04/2015 1142   AST 21 01/04/2015 1142   ALT 22 01/04/2015 1142   BILITOT 0.3 01/04/2015 1142       RADIOGRAPHIC STUDIES: I have personally reviewed the radiological images as listed and agreed with the findings in the report. No results found.   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  Ferratin not available today.   Today Hematocrit is 43.    I would not recommend any phlebotomy today.  I would recommend rechecking labs ferritin in approximately 3 months and phlebotomy  If ferritin greater than 100.  #  I discussed with the patient in detail the  Natural history of hemochromatosis.  Patient does not have any end organ dysfunction.    Orders Placed This Encounter  Procedures  . Ferritin    Standing Status: Future  Number of Occurrences:      Standing Expiration Date: 04/26/2016  . CBC with Differential/Platelet    Standing Status: Future     Number of Occurrences:      Standing Expiration Date: 04/26/2016  . Comprehensive metabolic panel    Standing Status: Future     Number of Occurrences:      Standing Expiration Date: 04/26/2016   All questions were answered. The patient knows to call the clinic with any problems, questions or concerns. No barriers to learning was detected. I spent 15 minutes counseling the patient face to face. The total time spent in the appointment was 30 minutes and more than 50% was on counseling and review of test results     Cammie Sickle, MD 03/23/2015 2:39 PM

## 2015-05-16 ENCOUNTER — Other Ambulatory Visit: Payer: Self-pay | Admitting: Family Medicine

## 2015-06-01 ENCOUNTER — Ambulatory Visit
Admission: RE | Admit: 2015-06-01 | Discharge: 2015-06-01 | Disposition: A | Discharge status: Home / Self Care | Payer: BLUE CROSS/BLUE SHIELD | Source: Ambulatory Visit | Attending: Family Medicine | Admitting: Family Medicine

## 2015-06-01 DIAGNOSIS — Z1239 Encounter for other screening for malignant neoplasm of breast: Secondary | ICD-10-CM

## 2015-06-01 DIAGNOSIS — Z1231 Encounter for screening mammogram for malignant neoplasm of breast: Secondary | ICD-10-CM | POA: Insufficient documentation

## 2015-06-05 ENCOUNTER — Other Ambulatory Visit: Payer: Self-pay | Admitting: Family Medicine

## 2015-06-05 DIAGNOSIS — G4700 Insomnia, unspecified: Secondary | ICD-10-CM

## 2015-06-05 NOTE — Telephone Encounter (Signed)
Printed, please fax or call in to pharmacy. Thank you.   

## 2015-06-23 ENCOUNTER — Ambulatory Visit: Payer: BLUE CROSS/BLUE SHIELD | Admitting: Internal Medicine

## 2015-06-23 ENCOUNTER — Other Ambulatory Visit: Payer: BLUE CROSS/BLUE SHIELD

## 2015-06-29 ENCOUNTER — Inpatient Hospital Stay: Payer: BLUE CROSS/BLUE SHIELD | Attending: Internal Medicine

## 2015-06-29 ENCOUNTER — Inpatient Hospital Stay: Payer: BLUE CROSS/BLUE SHIELD

## 2015-06-29 ENCOUNTER — Inpatient Hospital Stay (HOSPITAL_BASED_OUTPATIENT_CLINIC_OR_DEPARTMENT_OTHER): Payer: BLUE CROSS/BLUE SHIELD | Admitting: Internal Medicine

## 2015-06-29 ENCOUNTER — Encounter: Payer: Self-pay | Admitting: Internal Medicine

## 2015-06-29 ENCOUNTER — Telehealth: Payer: Self-pay | Admitting: *Deleted

## 2015-06-29 DIAGNOSIS — F17210 Nicotine dependence, cigarettes, uncomplicated: Secondary | ICD-10-CM | POA: Insufficient documentation

## 2015-06-29 DIAGNOSIS — Z880 Allergy status to penicillin: Secondary | ICD-10-CM

## 2015-06-29 DIAGNOSIS — Z79899 Other long term (current) drug therapy: Secondary | ICD-10-CM | POA: Diagnosis not present

## 2015-06-29 DIAGNOSIS — M810 Age-related osteoporosis without current pathological fracture: Secondary | ICD-10-CM | POA: Insufficient documentation

## 2015-06-29 DIAGNOSIS — I10 Essential (primary) hypertension: Secondary | ICD-10-CM | POA: Diagnosis not present

## 2015-06-29 DIAGNOSIS — E785 Hyperlipidemia, unspecified: Secondary | ICD-10-CM

## 2015-06-29 DIAGNOSIS — Z23 Encounter for immunization: Secondary | ICD-10-CM

## 2015-06-29 LAB — CBC WITH DIFFERENTIAL/PLATELET
BASOS ABS: 0.1 10*3/uL (ref 0–0.1)
Basophils Relative: 1 %
EOS ABS: 0.1 10*3/uL (ref 0–0.7)
Eosinophils Relative: 2 %
HCT: 43.1 % (ref 35.0–47.0)
HEMOGLOBIN: 14.6 g/dL (ref 12.0–16.0)
LYMPHS ABS: 1.9 10*3/uL (ref 1.0–3.6)
LYMPHS PCT: 29 %
MCH: 34.1 pg — AB (ref 26.0–34.0)
MCHC: 33.9 g/dL (ref 32.0–36.0)
MCV: 100.8 fL — AB (ref 80.0–100.0)
Monocytes Absolute: 0.6 10*3/uL (ref 0.2–0.9)
Monocytes Relative: 9 %
NEUTROS PCT: 59 %
Neutro Abs: 3.9 10*3/uL (ref 1.4–6.5)
Platelets: 270 10*3/uL (ref 150–440)
RBC: 4.27 MIL/uL (ref 3.80–5.20)
RDW: 13.3 % (ref 11.5–14.5)
WBC: 6.6 10*3/uL (ref 3.6–11.0)

## 2015-06-29 LAB — COMPREHENSIVE METABOLIC PANEL
ALK PHOS: 69 U/L (ref 38–126)
ALT: 22 U/L (ref 14–54)
AST: 19 U/L (ref 15–41)
Albumin: 4.4 g/dL (ref 3.5–5.0)
Anion gap: 6 (ref 5–15)
BUN: 21 mg/dL — AB (ref 6–20)
CALCIUM: 9.1 mg/dL (ref 8.9–10.3)
CO2: 26 mmol/L (ref 22–32)
CREATININE: 0.9 mg/dL (ref 0.44–1.00)
Chloride: 102 mmol/L (ref 101–111)
GFR calc non Af Amer: >60 mL/min (ref >60)
GLUCOSE: 109 mg/dL — AB (ref 65–99)
Potassium: 3.8 mmol/L (ref 3.5–5.1)
SODIUM: 134 mmol/L — AB (ref 135–145)
Total Bilirubin: 0.5 mg/dL (ref 0.3–1.2)
Total Protein: 6.7 g/dL (ref 6.5–8.1)

## 2015-06-29 LAB — FERRITIN: Ferritin: 69 ng/mL (ref 11–307)

## 2015-06-29 NOTE — Telephone Encounter (Signed)
Phlebotomy not needed today per Dr. Rogue Bussing. RN to contact patient to let her know.

## 2015-06-29 NOTE — Progress Notes (Signed)
[place] OFFICE PROGRESS NOTE  Patient Care Team: Margarita Rana, MD as PCP - General (Family Medicine)   SUMMARY OF HEMATOLOGIC/ONCOLOGIC HISTORY:  # April 2015- HEMOCHROMATOSIS [screening- compound Heterozygous C282y & H63d] on Phlebotomy q Monthly  INTERVAL HISTORY:  A very pleasant 65 year old female patient with above history of  [name] following screening   Without any end organ dysfunction is here for follow-up /phlebotomy.  Denies any nausea vomiting. Denies any swelling in legs. No weight loss or weight gain.   REVIEW OF SYSTEMS:  A complete 10 point review of system is done which is negative except mentioned above/history of present illness.   PAST MEDICAL HISTORY :  Past Medical History  Diagnosis Date  . 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 Allergy   . Anxiety   . Depression   . Hypertension   . Hyperlipidemia   . Osteoporosis   . Migraine   . H. pylori infection   . History of palpitations   . Panic disorder   . Guaiac positive stools     history    PAST SURGICAL HISTORY :   Past Surgical History  Procedure Laterality Date  . Rhinoplasty  2002  . Abdominal hysterectomy  1983    Ovaries have been removed.  Marland Kitchen Appendectomy    . Abdominoplasty    . Upper gi endoscopy  2008  . Colonoscopy  03/2006  . Breast biopsy Right     neg    FAMILY HISTORY :   Family History  Problem Relation Age of Onset  . Hypertension Mother   . Hyperlipidemia Mother   . Hypothyroidism Mother   . Fibromyalgia Mother   . Lung cancer Father   . Coronary artery disease Father   . Hyperlipidemia Father   . Hypertension Father   . Heart attack Father   . Hyperlipidemia Sister   . Hypertension Sister   . Fibromyalgia Sister   . ADD / ADHD Brother   . Hemochromatosis Sister     SOCIAL HISTORY:   Social History  Substance Use Topics  . Smoking status: Current Every Day Smoker -- 0.50 packs/day for 40 years    Types: Cigarettes  . Smokeless  tobacco: Never Used     Comment: pt declines smoking cessation  . Alcohol Use: 0.0 oz/week    0 Standard drinks or equivalent per week     Comment: Three glasses of per month    ALLERGIES:  is allergic to augmentin  and lisinopril.  MEDICATIONS:  Current Outpatient Prescriptions  Medication Sig Dispense Refill  . buPROPion (WELLBUTRIN SR) 150 MG 12 hr tablet Take 150 mg by mouth. As needed    . escitalopram (LEXAPRO) 20 MG tablet Take 20 mg by mouth daily.     . eszopiclone (LUNESTA) 2 MG TABS tablet TAKE 1 BY MOUTH AT BEDTIME 90 tablet 1  . fexofenadine (ALLEGRA) 180 MG tablet Take 1 tablet by mouth as needed.    . fluticasone (FLONASE) 50 MCG/ACT nasal spray Place 1 spray into the nose. As needed    . pravastatin (PRAVACHOL) 20 MG tablet Take 1 tablet (20 mg total) by mouth daily. 90 tablet 3  . butalbital-acetaminophen-caffeine (FIORICET WITH CODEINE) 50-325-40-30 MG per capsule TAKE 1 TO 2 CAPSULES BY MOUTH EVERY 4 HOURS AS NEEDED (Patient not taking: Reported on 06/29/2015) 60 capsule 0   No current facility-administered medications for this visit.    PHYSICAL EXAMINATION: ECOG PERFORMANCE STATUS: 0 - Asymptomatic  BP 165/5 mmHg  Pulse 66  Temp(Src) 98.2 F (36.8 C) (Tympanic)  Resp 18  Ht 5\' 1"  (1.549 m)  Wt 104 lb 0.9 oz (47.2 kg)  BMI 19.67 kg/m2  Filed Weights   06/29/15 1358  Weight: 104 lb 0.9 oz (47.2 kg)    GENERAL: Well-nourished well-developed; Alert, no distress and comfortable.   She is alone. EYES: no pallor or icterus OROPHARYNX: no thrush or ulceration; good dentition  NECK: supple, no masses felt LYMPH:  no palpable lymphadenopathy in the cervical, axillary or inguinal regions LUNGS: clear to auscultation and  No wheeze or crackles HEART/CVS: regular rate & rhythm and no murmurs; No lower extremity edema ABDOMEN:abdomen soft, non-tender and normal bowel sounds Musculoskeletal:no cyanosis of digits and no clubbing  PSYCH: alert & oriented x 3 with  fluent speech NEURO: no focal motor/sensory deficits SKIN:  no rashes or significant lesions  LABORATORY DATA:  I have reviewed the data as listed    Component Value Date/Time   NA 134* 06/29/2015 1333   NA 140 01/04/2015 1142   K 3.8 06/29/2015 1333   CL 102 06/29/2015 1333   CO2 26 06/29/2015 1333   GLUCOSE 109* 06/29/2015 1333   GLUCOSE 109* 01/04/2015 1142   BUN 21* 06/29/2015 1333   BUN 17 01/04/2015 1142   CREATININE 0.90 06/29/2015 1333   CREATININE 0.6 09/05/2014   CALCIUM 9.1 06/29/2015 1333   PROT 6.7 06/29/2015 1333   PROT 7.1 01/04/2015 1142   ALBUMIN 4.4 06/29/2015 1333   ALBUMIN 5.1* 01/04/2015 1142   AST 19 06/29/2015 1333   ALT 22 06/29/2015 1333   ALKPHOS 69 06/29/2015 1333   BILITOT 0.5 06/29/2015 1333   BILITOT 0.3 01/04/2015 1142   GFRNONAA >60 06/29/2015 1333   GFRAA >60 06/29/2015 1333    No results found for: SPEP, UPEP  Lab Results  Component Value Date   WBC 6.6 06/29/2015   NEUTROABS 3.9 06/29/2015   HGB 14.6 06/29/2015   HCT 43.1 06/29/2015   MCV 100.8* 06/29/2015   PLT 270 06/29/2015      Chemistry      Component Value Date/Time   NA 134* 06/29/2015 1333   NA 140 01/04/2015 1142   K 3.8 06/29/2015 1333   CL 102 06/29/2015 1333   CO2 26 06/29/2015 1333   BUN 21* 06/29/2015 1333   BUN 17 01/04/2015 1142   CREATININE 0.90 06/29/2015 1333   CREATININE 0.6 09/05/2014   GLU 102 09/05/2014      Component Value Date/Time   CALCIUM 9.1 06/29/2015 1333   ALKPHOS 69 06/29/2015 1333   AST 19 06/29/2015 1333   ALT 22 06/29/2015 1333   BILITOT 0.5 06/29/2015 1333   BILITOT 0.3 01/04/2015 1142       RADIOGRAPHIC STUDIES: I have personally reviewed the radiological images as listed and agreed with the findings in the report. No results found.   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  Ferratin not available today yet; I would recommend phlebotomy  only if ferritin greater than 100.  # patient will be called if she needs phlebotomy. Otherwise she'll follow-up with [place] in approximately 3 months/she will have labs done today prior.   I spent 15 minutes counseling the patient face to face. The total time spent in the appointment was 30 minutes and more than 50% was on counseling and review of test results     Cammie Sickle, MD  06/29/2015 2:13 PM  Addendum: Ferritin today 69; patient does not need any phlebotomy follow-up as planned in 3 months; if possible phlebotomy at that time.

## 2015-06-29 NOTE — Telephone Encounter (Signed)
Left voice mail for patient. No phlebotomy needed.

## 2015-07-28 ENCOUNTER — Other Ambulatory Visit: Payer: Self-pay | Admitting: Family Medicine

## 2015-07-28 DIAGNOSIS — F410 Panic disorder [episodic paroxysmal anxiety] without agoraphobia: Secondary | ICD-10-CM

## 2015-09-27 ENCOUNTER — Inpatient Hospital Stay: Payer: BLUE CROSS/BLUE SHIELD

## 2015-09-28 ENCOUNTER — Ambulatory Visit: Payer: BLUE CROSS/BLUE SHIELD | Admitting: Internal Medicine

## 2015-09-28 ENCOUNTER — Inpatient Hospital Stay: Payer: BLUE CROSS/BLUE SHIELD | Attending: Internal Medicine

## 2015-09-28 ENCOUNTER — Ambulatory Visit: Payer: BLUE CROSS/BLUE SHIELD

## 2015-09-28 DIAGNOSIS — Z79899 Other long term (current) drug therapy: Secondary | ICD-10-CM | POA: Insufficient documentation

## 2015-09-28 DIAGNOSIS — E785 Hyperlipidemia, unspecified: Secondary | ICD-10-CM | POA: Diagnosis not present

## 2015-09-28 DIAGNOSIS — F17210 Nicotine dependence, cigarettes, uncomplicated: Secondary | ICD-10-CM | POA: Diagnosis not present

## 2015-09-28 DIAGNOSIS — M810 Age-related osteoporosis without current pathological fracture: Secondary | ICD-10-CM | POA: Insufficient documentation

## 2015-09-28 DIAGNOSIS — I10 Essential (primary) hypertension: Secondary | ICD-10-CM | POA: Diagnosis not present

## 2015-09-28 DIAGNOSIS — F418 Other specified anxiety disorders: Secondary | ICD-10-CM | POA: Diagnosis not present

## 2015-09-28 LAB — CBC WITH DIFFERENTIAL/PLATELET
BASOS PCT: 0 %
Basophils Absolute: 0 10*3/uL (ref 0–0.1)
EOS ABS: 0.1 10*3/uL (ref 0–0.7)
Eosinophils Relative: 2 %
HEMATOCRIT: 45 % (ref 35.0–47.0)
HEMOGLOBIN: 15.5 g/dL (ref 12.0–16.0)
Lymphocytes Relative: 32 %
Lymphs Abs: 2.4 10*3/uL (ref 1.0–3.6)
MCH: 34.8 pg — ABNORMAL HIGH (ref 26.0–34.0)
MCHC: 34.4 g/dL (ref 32.0–36.0)
MCV: 101.3 fL — ABNORMAL HIGH (ref 80.0–100.0)
MONOS PCT: 9 %
Monocytes Absolute: 0.7 10*3/uL (ref 0.2–0.9)
NEUTROS ABS: 4.4 10*3/uL (ref 1.4–6.5)
NEUTROS PCT: 57 %
Platelets: 255 10*3/uL (ref 150–440)
RBC: 4.44 MIL/uL (ref 3.80–5.20)
RDW: 12.6 % (ref 11.5–14.5)
WBC: 7.7 10*3/uL (ref 3.6–11.0)

## 2015-09-28 LAB — FERRITIN: Ferritin: 92 ng/mL (ref 11–307)

## 2015-09-29 ENCOUNTER — Inpatient Hospital Stay (HOSPITAL_BASED_OUTPATIENT_CLINIC_OR_DEPARTMENT_OTHER): Payer: BLUE CROSS/BLUE SHIELD | Admitting: Internal Medicine

## 2015-09-29 ENCOUNTER — Inpatient Hospital Stay: Payer: BLUE CROSS/BLUE SHIELD

## 2015-09-29 DIAGNOSIS — Z79899 Other long term (current) drug therapy: Secondary | ICD-10-CM

## 2015-09-29 DIAGNOSIS — E785 Hyperlipidemia, unspecified: Secondary | ICD-10-CM | POA: Diagnosis not present

## 2015-09-29 DIAGNOSIS — F17210 Nicotine dependence, cigarettes, uncomplicated: Secondary | ICD-10-CM

## 2015-09-29 DIAGNOSIS — I10 Essential (primary) hypertension: Secondary | ICD-10-CM | POA: Diagnosis not present

## 2015-09-29 DIAGNOSIS — M810 Age-related osteoporosis without current pathological fracture: Secondary | ICD-10-CM | POA: Diagnosis not present

## 2015-09-29 DIAGNOSIS — F418 Other specified anxiety disorders: Secondary | ICD-10-CM

## 2015-09-29 NOTE — Progress Notes (Signed)
Patient ambulates without assistance, vitals documented, patient denies pain or discomfort.  Medication record updated, information provided by patient.

## 2015-09-29 NOTE — Progress Notes (Signed)
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  Patient continues to deny any skin rash denies any jaundice. No itching. Denies any nausea vomiting. Denies any swelling in legs. No weight loss or weight gain.   REVIEW OF SYSTEMS:  A complete 10 point review of system is done which is negative except mentioned above/history of present illness.   PAST MEDICAL HISTORY :  Past Medical History  Diagnosis Date  . 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 Allergy   . Anxiety   . Depression   . Hypertension   . Hyperlipidemia   . Osteoporosis   . Migraine   . H. pylori infection   . History of palpitations   . Panic disorder   . Guaiac positive stools     history    PAST SURGICAL HISTORY :   Past Surgical History  Procedure Laterality Date  . Rhinoplasty  2002  . Abdominal hysterectomy  1983    Ovaries have been removed.  Marland Kitchen Appendectomy    . Abdominoplasty    . Upper gi endoscopy  2008  . Colonoscopy  03/2006  . Breast biopsy Right     neg    FAMILY HISTORY :   Family History  Problem Relation Age of Onset  . Hypertension Mother   . Hyperlipidemia Mother   . Hypothyroidism Mother   . Fibromyalgia Mother   . Lung cancer Father   . Coronary artery disease Father   . Hyperlipidemia Father   . Hypertension Father   . Heart attack Father   . Hyperlipidemia Sister   . Hypertension Sister   . Fibromyalgia Sister   . ADD / ADHD Brother   . Hemochromatosis Sister     SOCIAL HISTORY:   Social History  Substance Use Topics  . Smoking status: Current Every Day  Smoker -- 0.50 packs/day for 40 years    Types: Cigarettes  . Smokeless tobacco: Never Used     Comment: pt declines smoking cessation  . Alcohol Use: 0.0 oz/week    0 Standard drinks or equivalent per week     Comment: Three glasses of per month    ALLERGIES:  is allergic to augmentin  and lisinopril.  MEDICATIONS:  Current Outpatient Prescriptions  Medication Sig Dispense Refill  . butalbital-acetaminophen-caffeine (FIORICET WITH CODEINE) 50-325-40-30 MG per capsule TAKE 1 TO 2 CAPSULES BY MOUTH EVERY 4 HOURS AS NEEDED 60 capsule 0  . clobetasol ointment (TEMOVATE) 0.05 % APP TOPICALLY TO GLUTEAL AREA ONCE D PRF DERMATITIS  1  . escitalopram (LEXAPRO) 20 MG tablet TAKE 1 BY MOUTH DAILY 90 tablet 1  . eszopiclone (LUNESTA) 2 MG TABS tablet TAKE 1 BY MOUTH AT BEDTIME 90 tablet 1  . fexofenadine (ALLEGRA) 180 MG tablet Take 1 tablet by mouth as needed.    . fluticasone (FLONASE) 50 MCG/ACT nasal spray Place 1 spray into the nose. As needed    . pravastatin (PRAVACHOL) 20 MG tablet Take 1 tablet (20 mg total) by mouth daily. 90 tablet 3  . buPROPion (WELLBUTRIN SR) 150 MG 12 hr tablet Take 150 mg by mouth. Reported on 09/29/2015  No current facility-administered medications for this visit.    PHYSICAL EXAMINATION: ECOG PERFORMANCE STATUS: 0 - Asymptomatic  BP 152/88 mmHg  Pulse 65  Temp(Src) 97 F (36.1 C) (Tympanic)  Wt 103 lb 13.4 oz (47.1 kg)  Filed Weights   09/29/15 1349  Weight: 103 lb 13.4 oz (47.1 kg)    GENERAL: Well-nourished well-developed; Alert, no distress and comfortable.   She is alone. EYES: no pallor or icterus OROPHARYNX: no thrush or ulceration; good dentition  NECK: supple, no masses felt LYMPH:  no palpable lymphadenopathy in the cervical, axillary or inguinal regions LUNGS: clear to auscultation and  No wheeze or crackles HEART/CVS: regular rate & rhythm and no murmurs; No lower extremity edema ABDOMEN:abdomen soft, non-tender and normal bowel  sounds Musculoskeletal:no cyanosis of digits and no clubbing  PSYCH: alert & oriented x 3 with fluent speech NEURO: no focal motor/sensory deficits SKIN:  no rashes or significant lesions  LABORATORY DATA:  I have reviewed the data as listed    Component Value Date/Time   NA 134* 06/29/2015 1333   NA 140 01/04/2015 1142   K 3.8 06/29/2015 1333   CL 102 06/29/2015 1333   CO2 26 06/29/2015 1333   GLUCOSE 109* 06/29/2015 1333   GLUCOSE 109* 01/04/2015 1142   BUN 21* 06/29/2015 1333   BUN 17 01/04/2015 1142   CREATININE 0.90 06/29/2015 1333   CREATININE 0.6 09/05/2014   CALCIUM 9.1 06/29/2015 1333   PROT 6.7 06/29/2015 1333   PROT 7.1 01/04/2015 1142   ALBUMIN 4.4 06/29/2015 1333   ALBUMIN 5.1* 01/04/2015 1142   AST 19 06/29/2015 1333   ALT 22 06/29/2015 1333   ALKPHOS 69 06/29/2015 1333   BILITOT 0.5 06/29/2015 1333   BILITOT 0.3 01/04/2015 1142   GFRNONAA >60 06/29/2015 1333   GFRAA >60 06/29/2015 1333    No results found for: SPEP, UPEP  Lab Results  Component Value Date   WBC 7.7 09/28/2015   NEUTROABS 4.4 09/28/2015   HGB 15.5 09/28/2015   HCT 45.0 09/28/2015   MCV 101.3* 09/28/2015   PLT 255 09/28/2015      Chemistry      Component Value Date/Time   NA 134* 06/29/2015 1333   NA 140 01/04/2015 1142   K 3.8 06/29/2015 1333   CL 102 06/29/2015 1333   CO2 26 06/29/2015 1333   BUN 21* 06/29/2015 1333   BUN 17 01/04/2015 1142   CREATININE 0.90 06/29/2015 1333   CREATININE 0.6 09/05/2014   GLU 102 09/05/2014      Component Value Date/Time   CALCIUM 9.1 06/29/2015 1333   ALKPHOS 69 06/29/2015 1333   AST 19 06/29/2015 1333   ALT 22 06/29/2015 1333   BILITOT 0.5 06/29/2015 1333   BILITOT 0.3 01/04/2015 1142       ASSESSMENT & PLAN:   # HEREDITARY  Hemochromatosis/ 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 Patient continues to be symptomatic. Recommend phlebotomy if greater than 150.  # Follow-up in 6 months with CBC  ferritin/possible phlebotomy.  Will also add AFP/cmp at next viist.     Cammie Sickle, MD 09/29/2015 2:14 PM

## 2015-10-13 ENCOUNTER — Other Ambulatory Visit: Payer: Self-pay | Admitting: Family Medicine

## 2015-10-13 ENCOUNTER — Other Ambulatory Visit: Payer: Self-pay

## 2015-10-13 DIAGNOSIS — G4700 Insomnia, unspecified: Secondary | ICD-10-CM

## 2015-10-13 DIAGNOSIS — G43809 Other migraine, not intractable, without status migrainosus: Secondary | ICD-10-CM

## 2015-10-13 MED ORDER — BUTALBITAL-APAP-CAFF-COD 50-325-40-30 MG PO CAPS: ORAL_CAPSULE | ORAL | Status: DC

## 2015-10-13 NOTE — Telephone Encounter (Signed)
Printed, please fax or call in to pharmacy. Thank you.   

## 2015-12-04 ENCOUNTER — Other Ambulatory Visit: Payer: Self-pay | Admitting: Family Medicine

## 2016-02-22 ENCOUNTER — Other Ambulatory Visit: Payer: Self-pay | Admitting: Family Medicine

## 2016-02-22 DIAGNOSIS — F410 Panic disorder [episodic paroxysmal anxiety] without agoraphobia: Secondary | ICD-10-CM

## 2016-03-27 ENCOUNTER — Inpatient Hospital Stay: Payer: BLUE CROSS/BLUE SHIELD | Attending: Internal Medicine

## 2016-03-27 DIAGNOSIS — D7589 Other specified diseases of blood and blood-forming organs: Secondary | ICD-10-CM | POA: Insufficient documentation

## 2016-03-27 LAB — COMPREHENSIVE METABOLIC PANEL
ALBUMIN: 4.7 g/dL (ref 3.5–5.0)
ALK PHOS: 76 U/L (ref 38–126)
ALT: 23 U/L (ref 14–54)
AST: 24 U/L (ref 15–41)
Anion gap: 12 (ref 5–15)
BILIRUBIN TOTAL: 0.8 mg/dL (ref 0.3–1.2)
BUN: 16 mg/dL (ref 6–20)
CALCIUM: 9.4 mg/dL (ref 8.9–10.3)
CO2: 24 mmol/L (ref 22–32)
Chloride: 100 mmol/L — ABNORMAL LOW (ref 101–111)
Creatinine, Ser: 0.6 mg/dL (ref 0.44–1.00)
GFR calc Af Amer: >60 mL/min (ref >60)
GLUCOSE: 105 mg/dL — AB (ref 65–99)
Potassium: 3.6 mmol/L (ref 3.5–5.1)
SODIUM: 136 mmol/L (ref 135–145)
TOTAL PROTEIN: 7.2 g/dL (ref 6.5–8.1)

## 2016-03-27 LAB — CBC WITH DIFFERENTIAL/PLATELET
BASOS PCT: 1 %
Basophils Absolute: 0.1 10*3/uL (ref 0–0.1)
EOS ABS: 0.1 10*3/uL (ref 0–0.7)
EOS PCT: 1 %
HCT: 44 % (ref 35.0–47.0)
HEMOGLOBIN: 15.2 g/dL (ref 12.0–16.0)
Lymphocytes Relative: 27 %
Lymphs Abs: 2.1 10*3/uL (ref 1.0–3.6)
MCH: 34.7 pg — AB (ref 26.0–34.0)
MCHC: 34.5 g/dL (ref 32.0–36.0)
MCV: 100.8 fL — ABNORMAL HIGH (ref 80.0–100.0)
MONO ABS: 0.7 10*3/uL (ref 0.2–0.9)
MONOS PCT: 9 %
NEUTROS PCT: 62 %
Neutro Abs: 4.7 10*3/uL (ref 1.4–6.5)
PLATELETS: 257 10*3/uL (ref 150–440)
RBC: 4.37 MIL/uL (ref 3.80–5.20)
RDW: 12.7 % (ref 11.5–14.5)
WBC: 7.6 10*3/uL (ref 3.6–11.0)

## 2016-03-28 LAB — AFP TUMOR MARKER: AFP TUMOR MARKER: 2.7 ng/mL (ref 0.0–8.3)

## 2016-03-29 ENCOUNTER — Inpatient Hospital Stay: Payer: BLUE CROSS/BLUE SHIELD

## 2016-03-29 ENCOUNTER — Inpatient Hospital Stay (HOSPITAL_BASED_OUTPATIENT_CLINIC_OR_DEPARTMENT_OTHER): Payer: BLUE CROSS/BLUE SHIELD | Admitting: Internal Medicine

## 2016-03-29 ENCOUNTER — Other Ambulatory Visit: Payer: Self-pay

## 2016-03-29 DIAGNOSIS — D7589 Other specified diseases of blood and blood-forming organs: Secondary | ICD-10-CM | POA: Insufficient documentation

## 2016-03-29 LAB — FERRITIN: Ferritin: 255 ng/mL (ref 11–307)

## 2016-03-29 LAB — IRON AND TIBC
IRON: 211 ug/dL — AB (ref 28–170)
Saturation Ratios: 78 % — ABNORMAL HIGH (ref 10.4–31.8)
TIBC: 270 ug/dL (ref 250–450)
UIBC: 59 ug/dL

## 2016-03-29 NOTE — Progress Notes (Signed)
April Best was scheduled to follow up today to review results of the iron panel  She had her lab drawn on 03/27/2016 so as to have results for discussion today. Unfortunately, only Cmp was ordered, no iron panel was ordered.  I requested lab to run iron panel and ferritin on the sample from 03/27/2016. Also, I noted her MCV has been high, so a TSH and B12 also requested to be added on.  She wished to be called back wiith results. She requested that we cancel the visit today since we could not review the results today with her. I will contact her with results and will schedule follow up as appropriate. I did discuss the normal alfhafetoproetin and CMP results with her and I explained to her my rationale for ordering TSH, B12.   She was referred to the nursing supervisor to discuss her concerns regarding scheduling.

## 2016-07-10 ENCOUNTER — Other Ambulatory Visit: Payer: Self-pay

## 2016-07-10 DIAGNOSIS — F410 Panic disorder [episodic paroxysmal anxiety] without agoraphobia: Secondary | ICD-10-CM

## 2016-07-10 DIAGNOSIS — G4700 Insomnia, unspecified: Secondary | ICD-10-CM

## 2016-07-10 MED ORDER — ESZOPICLONE 2 MG PO TABS: ORAL_TABLET | ORAL | 1 refills | Status: DC

## 2016-07-10 MED ORDER — ESCITALOPRAM OXALATE 20 MG PO TABS: 20.0000 mg | ORAL_TABLET | Freq: Every day | ORAL | 1 refills | Status: DC

## 2016-07-10 NOTE — Telephone Encounter (Signed)
Mail order pharmacy requesting refill. Thanks!

## 2016-07-12 ENCOUNTER — Telehealth: Payer: Self-pay

## 2016-07-12 DIAGNOSIS — G4700 Insomnia, unspecified: Secondary | ICD-10-CM

## 2016-07-12 MED ORDER — ESZOPICLONE 2 MG PO TABS: ORAL_TABLET | ORAL | 0 refills | Status: DC

## 2016-07-12 NOTE — Telephone Encounter (Signed)
Patient called and states that we send in refills for LExapro and Lunesta for the patient on 07/10/16 to mail order. Mail Order pharmacy is having trouble getting April Best they are out of stock with one company and contacting another and ot wil be about 10 days possibly before they can get that to her. Patient wanted to know if we can send enough of Lunesta to [facility] [address] for 10 days. Lexapro should come in over the weekend she hopes. Please review-aa

## 2016-07-12 NOTE — Telephone Encounter (Signed)
Please call in Lunesta

## 2016-07-12 NOTE — Telephone Encounter (Signed)
rx called in and pt advised-aa 

## 2016-09-25 ENCOUNTER — Inpatient Hospital Stay: Payer: BLUE CROSS/BLUE SHIELD

## 2016-09-27 ENCOUNTER — Inpatient Hospital Stay: Payer: BLUE CROSS/BLUE SHIELD | Admitting: Internal Medicine

## 2016-09-27 ENCOUNTER — Inpatient Hospital Stay: Payer: BLUE CROSS/BLUE SHIELD

## 2016-10-08 ENCOUNTER — Other Ambulatory Visit: Payer: BLUE CROSS/BLUE SHIELD

## 2016-10-09 ENCOUNTER — Inpatient Hospital Stay: Payer: BLUE CROSS/BLUE SHIELD

## 2016-10-09 DIAGNOSIS — M810 Age-related osteoporosis without current pathological fracture: Secondary | ICD-10-CM | POA: Diagnosis not present

## 2016-10-09 DIAGNOSIS — F17210 Nicotine dependence, cigarettes, uncomplicated: Secondary | ICD-10-CM | POA: Diagnosis not present

## 2016-10-09 DIAGNOSIS — I10 Essential (primary) hypertension: Secondary | ICD-10-CM | POA: Insufficient documentation

## 2016-10-09 DIAGNOSIS — F418 Other specified anxiety disorders: Secondary | ICD-10-CM | POA: Diagnosis not present

## 2016-10-09 DIAGNOSIS — D7589 Other specified diseases of blood and blood-forming organs: Secondary | ICD-10-CM | POA: Diagnosis not present

## 2016-10-09 DIAGNOSIS — Z880 Allergy status to penicillin: Secondary | ICD-10-CM | POA: Insufficient documentation

## 2016-10-09 DIAGNOSIS — Z801 Family history of malignant neoplasm of trachea, bronchus and lung: Secondary | ICD-10-CM | POA: Insufficient documentation

## 2016-10-09 DIAGNOSIS — Z79899 Other long term (current) drug therapy: Secondary | ICD-10-CM | POA: Diagnosis not present

## 2016-10-09 DIAGNOSIS — E785 Hyperlipidemia, unspecified: Secondary | ICD-10-CM | POA: Diagnosis not present

## 2016-10-09 LAB — CBC WITH DIFFERENTIAL/PLATELET
Basophils Absolute: 0.1 10*3/uL (ref 0–0.1)
Basophils Relative: 1 %
EOS PCT: 3 %
Eosinophils Absolute: 0.2 10*3/uL (ref 0–0.7)
HCT: 44.1 % (ref 35.0–47.0)
HEMOGLOBIN: 15.2 g/dL (ref 12.0–16.0)
LYMPHS ABS: 2.5 10*3/uL (ref 1.0–3.6)
LYMPHS PCT: 35 %
MCH: 35.1 pg — AB (ref 26.0–34.0)
MCHC: 34.6 g/dL (ref 32.0–36.0)
MCV: 101.5 fL — AB (ref 80.0–100.0)
Monocytes Absolute: 0.5 10*3/uL (ref 0.2–0.9)
Monocytes Relative: 8 %
Neutro Abs: 3.8 10*3/uL (ref 1.4–6.5)
Neutrophils Relative %: 53 %
PLATELETS: 379 10*3/uL (ref 150–440)
RBC: 4.34 MIL/uL (ref 3.80–5.20)
RDW: 12.8 % (ref 11.5–14.5)
WBC: 7.1 10*3/uL (ref 3.6–11.0)

## 2016-10-09 LAB — COMPREHENSIVE METABOLIC PANEL
ALK PHOS: 91 U/L (ref 38–126)
ALT: 33 U/L (ref 14–54)
AST: 40 U/L (ref 15–41)
Albumin: 4.3 g/dL (ref 3.5–5.0)
Anion gap: 10 (ref 5–15)
BUN: 17 mg/dL (ref 6–20)
CO2: 27 mmol/L (ref 22–32)
CREATININE: 0.57 mg/dL (ref 0.44–1.00)
Calcium: 9.3 mg/dL (ref 8.9–10.3)
Chloride: 104 mmol/L (ref 101–111)
Glucose, Bld: 97 mg/dL (ref 65–99)
Potassium: 4.5 mmol/L (ref 3.5–5.1)
Sodium: 141 mmol/L (ref 135–145)
Total Bilirubin: 0.5 mg/dL (ref 0.3–1.2)
Total Protein: 7.8 g/dL (ref 6.5–8.1)

## 2016-10-09 LAB — IRON AND TIBC
IRON: 220 ug/dL — AB (ref 28–170)
Saturation Ratios: 89 % — ABNORMAL HIGH (ref 10.4–31.8)
TIBC: 248 ug/dL — ABNORMAL LOW (ref 250–450)
UIBC: 28 ug/dL

## 2016-10-09 LAB — FERRITIN: FERRITIN: 223 ng/mL (ref 11–307)

## 2016-10-10 ENCOUNTER — Inpatient Hospital Stay: Payer: BLUE CROSS/BLUE SHIELD

## 2016-10-10 ENCOUNTER — Inpatient Hospital Stay: Payer: BLUE CROSS/BLUE SHIELD | Attending: Internal Medicine | Admitting: Internal Medicine

## 2016-10-10 DIAGNOSIS — Z79899 Other long term (current) drug therapy: Secondary | ICD-10-CM

## 2016-10-10 DIAGNOSIS — F17210 Nicotine dependence, cigarettes, uncomplicated: Secondary | ICD-10-CM | POA: Diagnosis not present

## 2016-10-10 DIAGNOSIS — I10 Essential (primary) hypertension: Secondary | ICD-10-CM | POA: Diagnosis not present

## 2016-10-10 DIAGNOSIS — F418 Other specified anxiety disorders: Secondary | ICD-10-CM

## 2016-10-10 DIAGNOSIS — Z801 Family history of malignant neoplasm of trachea, bronchus and lung: Secondary | ICD-10-CM | POA: Diagnosis not present

## 2016-10-10 DIAGNOSIS — Z880 Allergy status to penicillin: Secondary | ICD-10-CM

## 2016-10-10 DIAGNOSIS — E785 Hyperlipidemia, unspecified: Secondary | ICD-10-CM | POA: Diagnosis not present

## 2016-10-10 DIAGNOSIS — D7589 Other specified diseases of blood and blood-forming organs: Secondary | ICD-10-CM | POA: Diagnosis not present

## 2016-10-10 DIAGNOSIS — M810 Age-related osteoporosis without current pathological fracture: Secondary | ICD-10-CM | POA: Diagnosis not present

## 2016-10-10 NOTE — Assessment & Plan Note (Addendum)
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 Recommend phlebotomy if greater ferritin than 150 or iron saturation greater than 50% [which is higher].   # Mild macrocytosis- ? Wine every day. Monitor closely.  # Follow-up in 6 months with CBC/ iron studies-ferritin/possible phlebotomy.  Iron studies/ ferritin/cbc/phlebotmy in 3 months.

## 2016-10-10 NOTE — Progress Notes (Signed)
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 She has not had phlebotomy in the last many months.  Patient continues to deny any skin rash denies any jaundice.  Denies any nausea vomiting. Denies any swelling in legs. No weight loss or weight gain. Denies any fatigue. She is about 1 glass of wine every night.  REVIEW OF SYSTEMS:  A complete 10 point review of system is done which is negative except mentioned above/history of present illness.   PAST MEDICAL HISTORY :  Past Medical History:  Diagnosis Date  . Allergy   . Anxiety   . Depression   . Guaiac positive stools    history  . H. pylori infection   . 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 History of palpitations   . Hyperlipidemia   . Hypertension   . Migraine   . Osteoporosis   . Panic disorder     PAST SURGICAL HISTORY :   Past Surgical History:  Procedure Laterality Date  . ABDOMINAL HYSTERECTOMY  1983   Ovaries have been removed.  . ABDOMINOPLASTY    . APPENDECTOMY    . BREAST BIOPSY Right    neg  . COLONOSCOPY  03/2006  . RHINOPLASTY  2002  . UPPER GI ENDOSCOPY  2008    FAMILY HISTORY :   Family History  Problem Relation Age of Onset  . Hypertension Mother   . Hyperlipidemia Mother   . Hypothyroidism Mother   . Fibromyalgia Mother   . Lung cancer Father   . Coronary artery disease Father   . Hyperlipidemia Father   . Hypertension Father   . Heart attack Father   . Hyperlipidemia Sister   . Hypertension Sister   . Fibromyalgia Sister   . ADD / ADHD Brother   . Hemochromatosis Sister      SOCIAL HISTORY:   Social History  Substance Use Topics  . Smoking status: Current Every Day Smoker    Packs/day: 0.50    Years: 40.00    Types: Cigarettes  . Smokeless tobacco: Never Used     Comment: pt declines smoking cessation  . Alcohol use 0.0 oz/week     Comment: Three glasses of per month    ALLERGIES:  is allergic to augmentin  [amoxicillin-pot clavulanate] and lisinopril.  MEDICATIONS:  Current Outpatient Prescriptions  Medication Sig Dispense Refill  . butalbital-acetaminophen-caffeine (FIORICET WITH CODEINE) 50-325-40-30 MG capsule TAKE 1 TO 2 CAPSULES BY MOUTH EVERY 4 HOURS AS NEEDED 60 capsule 5  . clobetasol ointment (TEMOVATE) 0.05 % APP TOPICALLY TO GLUTEAL AREA ONCE D PRF DERMATITIS  1  . escitalopram (LEXAPRO) 20 MG tablet Take 1 tablet (20 mg total) by mouth daily. 90 tablet 1  . eszopiclone (LUNESTA) 2 MG TABS tablet TAKE 1 BY MOUTH AT BEDTIME 10 tablet 0  . fluticasone (FLONASE) 50 MCG/ACT nasal spray SHAKE LIQUID AND USE 2 SPRAYS IN EACH NOSTRIL EVERY DAY 16 g 0  . loratadine (CLARITIN) 10 MG tablet Take 10 mg by mouth daily.    Marland Kitchen buPROPion (WELLBUTRIN SR) 150 MG 12 hr tablet Take 1 tablet (150 mg total) by mouth daily. Pt needs  to schedule an office visit before anymore refills. (Patient not taking: Reported on 10/10/2016) 90 tablet 0   No current facility-administered medications for this visit.     PHYSICAL EXAMINATION: ECOG PERFORMANCE STATUS: 0 - Asymptomatic  BP (!) 165/85 (Patient Position: Sitting)   Pulse 68   Temp 97 F (36.1 C) (Tympanic)   Resp 16   Ht 5\' 1"  (1.549 m)   Wt 101 lb 3.2 oz (45.9 kg)   BMI 19.12 kg/m   Filed Weights   10/10/16 1121  Weight: 101 lb 3.2 oz (45.9 kg)    GENERAL: Well-nourished well-developed; Alert, no distress and comfortable.   She is alone. EYES: no pallor or icterus OROPHARYNX: no thrush or ulceration; good dentition  NECK: supple, no masses felt LYMPH:  no palpable lymphadenopathy in the  cervical, axillary or inguinal regions LUNGS: clear to auscultation and  No wheeze or crackles HEART/CVS: regular rate & rhythm and no murmurs; No lower extremity edema ABDOMEN:abdomen soft, non-tender and normal bowel sounds Musculoskeletal:no cyanosis of digits and no clubbing  PSYCH: alert & oriented x 3 with fluent speech NEURO: no focal motor/sensory deficits SKIN:  no rashes or significant lesions  LABORATORY DATA:  I have reviewed the data as listed    Component Value Date/Time   NA 141 10/09/2016 1058   NA 140 01/04/2015 1142   K 4.5 10/09/2016 1058   CL 104 10/09/2016 1058   CO2 27 10/09/2016 1058   GLUCOSE 97 10/09/2016 1058   BUN 17 10/09/2016 1058   BUN 17 01/04/2015 1142   CREATININE 0.57 10/09/2016 1058   CALCIUM 9.3 10/09/2016 1058   PROT 7.8 10/09/2016 1058   PROT 7.1 01/04/2015 1142   ALBUMIN 4.3 10/09/2016 1058   ALBUMIN 5.1 (H) 01/04/2015 1142   AST 40 10/09/2016 1058   ALT 33 10/09/2016 1058   ALKPHOS 91 10/09/2016 1058   BILITOT 0.5 10/09/2016 1058   BILITOT 0.3 01/04/2015 1142   GFRNONAA >60 10/09/2016 1058   GFRAA >60 10/09/2016 1058    No results found for: SPEP, UPEP  Lab Results  Component Value Date   WBC 7.1 10/09/2016   NEUTROABS 3.8 10/09/2016   HGB 15.2 10/09/2016   HCT 44.1 10/09/2016   MCV 101.5 (H) 10/09/2016   PLT 379 10/09/2016      Chemistry      Component Value Date/Time   NA 141 10/09/2016 1058   NA 140 01/04/2015 1142   K 4.5 10/09/2016 1058   CL 104 10/09/2016 1058   CO2 27 10/09/2016 1058   BUN 17 10/09/2016 1058   BUN 17 01/04/2015 1142   CREATININE 0.57 10/09/2016 1058   GLU 102 09/05/2014      Component Value Date/Time   CALCIUM 9.3 10/09/2016 1058   ALKPHOS 91 10/09/2016 1058   AST 40 10/09/2016 1058   ALT 33 10/09/2016 1058   BILITOT 0.5 10/09/2016 1058   BILITOT 0.3 01/04/2015 1142       ASSESSMENT & PLAN:   [name] ([place]) # HEREDITARY  Hemochromatosis/ compound heterozygous  C282Y & H63D-  Ferritin today is 232; sat- 89% Patient continues to be asymptomatic. Recommend phlebotomy if greater ferritin than 150 or iron saturation greater than 50% [which is higher].   # Mild macrocytosis- ? Wine every day. Monitor closely.  # Follow-up in 6 months with CBC/ iron studies-ferritin/possible phlebotomy.  Iron studies/ ferritin/cbc/phlebotmy in 3 months.        Cammie Sickle, MD 10/10/2016 3:06 PM

## 2017-01-08 ENCOUNTER — Inpatient Hospital Stay: Payer: BLUE CROSS/BLUE SHIELD | Attending: Internal Medicine

## 2017-01-08 DIAGNOSIS — Z880 Allergy status to penicillin: Secondary | ICD-10-CM | POA: Diagnosis not present

## 2017-01-08 DIAGNOSIS — I10 Essential (primary) hypertension: Secondary | ICD-10-CM | POA: Diagnosis not present

## 2017-01-08 DIAGNOSIS — F17210 Nicotine dependence, cigarettes, uncomplicated: Secondary | ICD-10-CM | POA: Insufficient documentation

## 2017-01-08 DIAGNOSIS — Z90710 Acquired absence of both cervix and uterus: Secondary | ICD-10-CM | POA: Insufficient documentation

## 2017-01-08 DIAGNOSIS — D7589 Other specified diseases of blood and blood-forming organs: Secondary | ICD-10-CM | POA: Diagnosis not present

## 2017-01-08 DIAGNOSIS — M810 Age-related osteoporosis without current pathological fracture: Secondary | ICD-10-CM | POA: Insufficient documentation

## 2017-01-08 DIAGNOSIS — Z79899 Other long term (current) drug therapy: Secondary | ICD-10-CM | POA: Diagnosis not present

## 2017-01-08 DIAGNOSIS — F418 Other specified anxiety disorders: Secondary | ICD-10-CM | POA: Diagnosis not present

## 2017-01-08 DIAGNOSIS — E785 Hyperlipidemia, unspecified: Secondary | ICD-10-CM | POA: Insufficient documentation

## 2017-01-08 LAB — CBC WITH DIFFERENTIAL/PLATELET
BASOS ABS: 0.1 10*3/uL (ref 0–0.1)
Basophils Relative: 1 %
EOS ABS: 0.2 10*3/uL (ref 0–0.7)
EOS PCT: 3 %
HCT: 43.3 % (ref 35.0–47.0)
Hemoglobin: 15.2 g/dL (ref 12.0–16.0)
LYMPHS ABS: 2.2 10*3/uL (ref 1.0–3.6)
LYMPHS PCT: 36 %
MCH: 35.2 pg — ABNORMAL HIGH (ref 26.0–34.0)
MCHC: 35.1 g/dL (ref 32.0–36.0)
MCV: 100.5 fL — AB (ref 80.0–100.0)
MONO ABS: 0.6 10*3/uL (ref 0.2–0.9)
Monocytes Relative: 10 %
Neutro Abs: 3.1 10*3/uL (ref 1.4–6.5)
Neutrophils Relative %: 50 %
PLATELETS: 398 10*3/uL (ref 150–440)
RBC: 4.31 MIL/uL (ref 3.80–5.20)
RDW: 12.4 % (ref 11.5–14.5)
WBC: 6.2 10*3/uL (ref 3.6–11.0)

## 2017-01-08 LAB — IRON AND TIBC
IRON: 203 ug/dL — AB (ref 28–170)
SATURATION RATIOS: 83 % — AB (ref 10.4–31.8)
TIBC: 244 ug/dL — AB (ref 250–450)
UIBC: 41 ug/dL

## 2017-01-08 LAB — FERRITIN: Ferritin: 198 ng/mL (ref 11–307)

## 2017-01-09 ENCOUNTER — Inpatient Hospital Stay: Payer: BLUE CROSS/BLUE SHIELD

## 2017-01-09 ENCOUNTER — Inpatient Hospital Stay (HOSPITAL_BASED_OUTPATIENT_CLINIC_OR_DEPARTMENT_OTHER): Payer: BLUE CROSS/BLUE SHIELD | Admitting: Internal Medicine

## 2017-01-09 DIAGNOSIS — F418 Other specified anxiety disorders: Secondary | ICD-10-CM

## 2017-01-09 DIAGNOSIS — F17210 Nicotine dependence, cigarettes, uncomplicated: Secondary | ICD-10-CM | POA: Diagnosis not present

## 2017-01-09 DIAGNOSIS — E785 Hyperlipidemia, unspecified: Secondary | ICD-10-CM

## 2017-01-09 DIAGNOSIS — D7589 Other specified diseases of blood and blood-forming organs: Secondary | ICD-10-CM | POA: Diagnosis not present

## 2017-01-09 DIAGNOSIS — I10 Essential (primary) hypertension: Secondary | ICD-10-CM

## 2017-01-09 DIAGNOSIS — Z880 Allergy status to penicillin: Secondary | ICD-10-CM | POA: Diagnosis not present

## 2017-01-09 DIAGNOSIS — Z90710 Acquired absence of both cervix and uterus: Secondary | ICD-10-CM | POA: Diagnosis not present

## 2017-01-09 DIAGNOSIS — M810 Age-related osteoporosis without current pathological fracture: Secondary | ICD-10-CM | POA: Diagnosis not present

## 2017-01-09 DIAGNOSIS — Z79899 Other long term (current) drug therapy: Secondary | ICD-10-CM

## 2017-01-09 NOTE — Progress Notes (Signed)
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   She continues to deny any heavy alcohol abuse. She drinks about 1 glass of wine a day. Patient continues to deny any skin rash denies any jaundice.  Denies any nausea vomiting. Denies any swelling in legs. No weight loss or weight gain. Denies any fatigue. Denies any joint pains.  REVIEW OF SYSTEMS:  A complete 10 point review of system is done which is negative except mentioned above/history of present illness.   PAST MEDICAL HISTORY :  Past Medical History:  Diagnosis Date  . Allergy   . Anxiety   . Depression   . Guaiac positive stools    history  . H. pylori infection   . 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 History of palpitations   . Hyperlipidemia   . Hypertension   . Migraine   . Osteoporosis   . Panic disorder     PAST SURGICAL HISTORY :   Past Surgical History:  Procedure Laterality Date  . ABDOMINAL HYSTERECTOMY  1983   Ovaries have been removed.  . ABDOMINOPLASTY    . APPENDECTOMY    . BREAST BIOPSY Right    neg  . COLONOSCOPY  03/2006  . RHINOPLASTY  2002  . UPPER GI ENDOSCOPY  2008    FAMILY HISTORY :   Family History  Problem Relation Age of Onset  . Hypertension Mother   . Hyperlipidemia Mother   . Hypothyroidism Mother   . Fibromyalgia Mother   . Lung cancer Father   . Coronary artery disease Father   . Hyperlipidemia Father   . Hypertension Father   . Heart attack Father   . Hyperlipidemia Sister   . Hypertension Sister   . Fibromyalgia Sister   . ADD / ADHD Brother   .  Hemochromatosis Sister     SOCIAL HISTORY:   Social History  Substance Use Topics  . Smoking status: Current Every Day Smoker    Packs/day: 0.50    Years: 40.00    Types: Cigarettes  . Smokeless tobacco: Never Used     Comment: pt declines smoking cessation  . Alcohol use 0.0 oz/week     Comment: Three glasses of per month    ALLERGIES:  is allergic to augmentin  [amoxicillin-pot clavulanate] and lisinopril.  MEDICATIONS:  Current Outpatient Prescriptions  Medication Sig Dispense Refill  . buPROPion (WELLBUTRIN SR) 150 MG 12 hr tablet Take 1 tablet (150 mg total) by mouth daily. Pt needs to schedule an office visit before anymore refills. 90 tablet 0  . butalbital-acetaminophen-caffeine (FIORICET WITH CODEINE) 50-325-40-30 MG capsule TAKE 1 TO 2 CAPSULES BY MOUTH EVERY 4 HOURS AS NEEDED 60 capsule 5  . clobetasol ointment (TEMOVATE) 0.05 % APP TOPICALLY TO GLUTEAL AREA ONCE D PRF DERMATITIS  1  . eszopiclone (LUNESTA) 2 MG TABS tablet TAKE 1 BY MOUTH AT BEDTIME 10 tablet 0  . fluticasone (FLONASE) 50 MCG/ACT nasal spray SHAKE LIQUID AND USE 2 SPRAYS IN EACH NOSTRIL EVERY DAY 16 g 0  . loratadine (CLARITIN) 10 MG tablet Take 10 mg by mouth daily.    Marland Kitchen escitalopram (LEXAPRO) 20  MG tablet TAKE 1 TABLET(20 MG TOTAL) BY MOUTH EVERY DAY 90 tablet 1   No current facility-administered medications for this visit.     PHYSICAL EXAMINATION: ECOG PERFORMANCE STATUS: 0 - Asymptomatic  BP (!) 160/84 (BP Location: Left Arm, Patient Position: Sitting)   Pulse 87   Temp 98.4 F (36.9 C) (Tympanic)   Resp 16   Ht 5\' 1"  (1.549 m)   Wt 100 lb 9.6 oz (45.6 kg)   SpO2 94%   BMI 19.01 kg/m   Filed Weights   01/09/17 1052  Weight: 100 lb 9.6 oz (45.6 kg)    GENERAL: Well-nourished well-developed; Alert, no distress and comfortable.   She is alone. EYES: no pallor or icterus OROPHARYNX: no thrush or ulceration; good dentition  NECK: supple, no masses felt LYMPH:  no palpable  lymphadenopathy in the cervical, axillary or inguinal regions LUNGS: clear to auscultation and  No wheeze or crackles HEART/CVS: regular rate & rhythm and no murmurs; No lower extremity edema ABDOMEN:abdomen soft, non-tender and normal bowel sounds Musculoskeletal:no cyanosis of digits and no clubbing  PSYCH: alert & oriented x 3 with fluent speech NEURO: no focal motor/sensory deficits SKIN:  no rashes or significant lesions  LABORATORY DATA:  I have reviewed the data as listed    Component Value Date/Time   NA 141 10/09/2016 1058   NA 140 01/04/2015 1142   K 4.5 10/09/2016 1058   CL 104 10/09/2016 1058   CO2 27 10/09/2016 1058   GLUCOSE 97 10/09/2016 1058   BUN 17 10/09/2016 1058   BUN 17 01/04/2015 1142   CREATININE 0.57 10/09/2016 1058   CALCIUM 9.3 10/09/2016 1058   PROT 7.8 10/09/2016 1058   PROT 7.1 01/04/2015 1142   ALBUMIN 4.3 10/09/2016 1058   ALBUMIN 5.1 (H) 01/04/2015 1142   AST 40 10/09/2016 1058   ALT 33 10/09/2016 1058   ALKPHOS 91 10/09/2016 1058   BILITOT 0.5 10/09/2016 1058   BILITOT 0.3 01/04/2015 1142   GFRNONAA >60 10/09/2016 1058   GFRAA >60 10/09/2016 1058    No results found for: SPEP, UPEP  Lab Results  Component Value Date   WBC 6.2 01/08/2017   NEUTROABS 3.1 01/08/2017   HGB 15.2 01/08/2017   HCT 43.3 01/08/2017   MCV 100.5 (H) 01/08/2017   PLT 398 01/08/2017      Chemistry      Component Value Date/Time   NA 141 10/09/2016 1058   NA 140 01/04/2015 1142   K 4.5 10/09/2016 1058   CL 104 10/09/2016 1058   CO2 27 10/09/2016 1058   BUN 17 10/09/2016 1058   BUN 17 01/04/2015 1142   CREATININE 0.57 10/09/2016 1058   GLU 102 09/05/2014      Component Value Date/Time   CALCIUM 9.3 10/09/2016 1058   ALKPHOS 91 10/09/2016 1058   AST 40 10/09/2016 1058   ALT 33 10/09/2016 1058   BILITOT 0.5 10/09/2016 1058   BILITOT 0.3 01/04/2015 1142       ASSESSMENT & PLAN:   [name] ([place]) # HEREDITARY  Hemochromatosis/  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 Recommend phlebotomy if greater ferritin than 150 or iron saturation greater than 50% [which is higher].   # Mild macrocytosis- ? Wine every day. Monitor closely.  # high blood pressure-  Check BP at home..  # Proceed with phebotomy today; and in 2 weeks/ no labs.cbc/phlebotmy in 3 months Follow-up in  6 months with CBC/ iron studies-ferritin;possible phlebotomy.       Cammie Sickle, MD 01/14/2017 8:18 AM

## 2017-01-09 NOTE — Assessment & Plan Note (Addendum)
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 Recommend phlebotomy if greater ferritin than 150 or iron saturation greater than 50% [which is higher].   # Mild macrocytosis- ? Wine every day. Monitor closely.  # high blood pressure-  Check BP at home..  # Proceed with phebotomy today; and in 2 weeks/ no labs.cbc/phlebotmy in 3 months Follow-up in 6 months with CBC/ iron studies-ferritin;possible phlebotomy.

## 2017-01-09 NOTE — Progress Notes (Signed)
Patient here for follow up and possible phebotomy. No changes since last appointment.

## 2017-01-13 ENCOUNTER — Other Ambulatory Visit: Payer: Self-pay | Admitting: Family Medicine

## 2017-01-13 DIAGNOSIS — F410 Panic disorder [episodic paroxysmal anxiety] without agoraphobia: Secondary | ICD-10-CM

## 2017-01-14 ENCOUNTER — Other Ambulatory Visit: Payer: Self-pay | Admitting: Family Medicine

## 2017-01-14 DIAGNOSIS — F410 Panic disorder [episodic paroxysmal anxiety] without agoraphobia: Secondary | ICD-10-CM

## 2017-01-15 ENCOUNTER — Ambulatory Visit (INDEPENDENT_AMBULATORY_CARE_PROVIDER_SITE_OTHER): Payer: BLUE CROSS/BLUE SHIELD | Admitting: Physician Assistant

## 2017-01-15 ENCOUNTER — Other Ambulatory Visit: Payer: Self-pay | Admitting: Family Medicine

## 2017-01-15 ENCOUNTER — Encounter: Payer: Self-pay | Admitting: Physician Assistant

## 2017-01-15 VITALS — BP 162/80 | HR 60 | Resp 16 | Ht 61.0 in | Wt 100.0 lb

## 2017-01-15 DIAGNOSIS — L409 Psoriasis, unspecified: Secondary | ICD-10-CM | POA: Diagnosis not present

## 2017-01-15 DIAGNOSIS — Z720 Tobacco use: Secondary | ICD-10-CM

## 2017-01-15 DIAGNOSIS — Z0000 Encounter for general adult medical examination without abnormal findings: Secondary | ICD-10-CM | POA: Diagnosis not present

## 2017-01-15 DIAGNOSIS — Z1231 Encounter for screening mammogram for malignant neoplasm of breast: Secondary | ICD-10-CM

## 2017-01-15 DIAGNOSIS — I10 Essential (primary) hypertension: Secondary | ICD-10-CM

## 2017-01-15 DIAGNOSIS — Z13820 Encounter for screening for osteoporosis: Secondary | ICD-10-CM | POA: Diagnosis not present

## 2017-01-15 DIAGNOSIS — G4700 Insomnia, unspecified: Secondary | ICD-10-CM

## 2017-01-15 DIAGNOSIS — F339 Major depressive disorder, recurrent, unspecified: Secondary | ICD-10-CM

## 2017-01-15 DIAGNOSIS — R7309 Other abnormal glucose: Secondary | ICD-10-CM

## 2017-01-15 DIAGNOSIS — Z1239 Encounter for other screening for malignant neoplasm of breast: Secondary | ICD-10-CM

## 2017-01-15 DIAGNOSIS — Z1211 Encounter for screening for malignant neoplasm of colon: Secondary | ICD-10-CM | POA: Diagnosis not present

## 2017-01-15 DIAGNOSIS — Z23 Encounter for immunization: Secondary | ICD-10-CM

## 2017-01-15 DIAGNOSIS — F4329 Adjustment disorder with other symptoms: Secondary | ICD-10-CM | POA: Diagnosis not present

## 2017-01-15 MED ORDER — AMLODIPINE BESYLATE 5 MG PO TABS: 5.0000 mg | ORAL_TABLET | Freq: Every day | ORAL | 0 refills | Status: DC

## 2017-01-15 MED ORDER — ALPRAZOLAM 0.25 MG PO TABS: 0.2500 mg | ORAL_TABLET | Freq: Every day | ORAL | 0 refills | Status: DC | PRN

## 2017-01-15 NOTE — Progress Notes (Signed)
Patient: April Best, Female    DOB: Nov 15, 1950, 66 y.o.   MRN: [id number] Visit Date: 01/15/2017  Today's Provider: Trinna Post, PA-C   Chief Complaint  Patient presents with  . Annual Exam   Subjective:      Annual physical exam April Best is a 66 y.o. female who presents today for health maintenance and complete physical. She feels fairly well. Pt reports her Blood pressure has been elevated at oncology.   She reports not exercising. She reports she is sleeping well.  She is married to her second husband, Octavia Bruckner, for the past 34 years. They live in [place]. She has one grown daughter from a previous marriage and three grandchildren. She also has two dogs.  Her mother is living, in her 72's with diabetes and recently underwent colectomy for recurrent diverticulitis. She currently has ostomy bag and is in rehab. Also has been having issues with her step dad. Parents recently moved into living home, stepdad fell earlier this year and had stay in the ICU. Requesting Xanax for acute stress. She had Rx in 2014 which helped her.  She has been taking Lunesta nightly since at least since 2012.  She has a history of hereditary hemachromatosis. She currently sees Dr. Rogue Bussing in oncology for maintenance labs and phlebotomy.   She is currently smoking. She has smoked <1/2 pack since her 39's. Was previously prescribed Chantix, never got the Rx. Not interested in quitting now.  She has history of osteoporosis, previously had IV treatment for this as she did not tolerate oral biphosphonates 2/2 constipation. She does not remember what the drugs was called, but recalls that after that her DEXA said osteopenia.  Last Mammogram was 05/2015 and was normal. She has had previous breast biopsy by Dr. Fleet Contras and two surgical clips placed. Nothing was ever malignant.  She has had a hysterectomy.  She had a normal colonoscopy in 2007 which was normal and is due for one this  year.  She is taking Lexapro daily for depression. She has not taken her Wellbutrin in a month. Feels her mood is stable, doesn't want medication changes. No SI/HI.  Clobetasol ointment for psoriasis  Migraines treated with fioricet with codeine, she does not have migraines very often.  She has been having issues with blood pressure. She was previously on Lisinopril for HTN but had cough and so discontinued. After she discontinued, her BP was normotensive and so treatment was not restarted. Most recently, she has been seen in oncology on 01/09/2017 with BP of 160/84. Today in our office her BP is 162/80. Her oncologist has recommended she be started on medication. She wants one with little side effects. ---------------------------------------------------------------   Review of Systems  Constitutional: Positive for fatigue and unexpected weight change. Negative for activity change, appetite change, chills, diaphoresis and fever.  HENT: Negative.   Eyes: Negative.   Respiratory: Negative.   Cardiovascular: Negative.   Gastrointestinal: Negative.   Endocrine: Negative.   Genitourinary: Negative.   Musculoskeletal: Negative.   Skin: Negative.   Allergic/Immunologic: Negative.   Neurological: Negative.   Hematological: Negative.   Psychiatric/Behavioral: Negative.     Social History      She  reports that she has been smoking Cigarettes.  She has a 20.00 pack-year smoking history. She has never used smokeless tobacco. She reports that she drinks alcohol. She reports that she does not use drugs.       Social History  Social History  . Marital status: Married    Spouse name: N/A  . Number of children: N/A  . Years of education: N/A   Social History Main Topics  . Smoking status: Current Every Day Smoker    Packs/day: 0.50    Years: 40.00    Types: Cigarettes  . Smokeless tobacco: Never Used     Comment: pt declines smoking cessation  . Alcohol use 0.0 oz/week     Comment:  Three glasses of per month  . Drug use: No  . Sexual activity: Not Asked   Other Topics Concern  . None   Social History Narrative  . None    Past Medical History:  Diagnosis Date  . Allergy   . Anxiety   . Depression   . Guaiac positive stools    history  . H. pylori infection   . 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 History of palpitations   . Hyperlipidemia   . Hypertension   . Migraine   . Osteoporosis   . Panic disorder      Patient Active Problem List   Diagnosis Date Noted  . Macrocytosis without anemia 03/29/2016  . Elevated glucose 02/13/2015  . [name] ([place]) 12/14/2014  . Recurrent major depressive episodes ([place]) 11/02/2014  . Family history of hemochromatosis 11/02/2014  . Hypercholesteremia 11/02/2014  . Benign hypertension 11/02/2014  . Insomnia 11/02/2014  . OP (osteoporosis) 11/02/2014  . Current tobacco use 11/02/2014  . 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 Episodic paroxysmal anxiety disorder 10/02/2007  . Gastrointestinal ulcer due to Helicobacter pylori 17/49/4496  . Headache, migraine 12/07/2004  . Allergic rhinitis 11/19/2004  . Psoriasis 07/31/2004    Past Surgical History:  Procedure Laterality Date  . ABDOMINAL HYSTERECTOMY  1983   Ovaries have been removed.  . ABDOMINOPLASTY    . APPENDECTOMY    . BREAST BIOPSY Right    neg  . COLONOSCOPY  03/2006  . RHINOPLASTY  2002  . UPPER GI ENDOSCOPY  2008    Family History        Family Status  Relation Status  . Mother Alive  . Father Deceased  . Sister Alive  . Brother Alive        Her family history includes ADD / ADHD in her brother; Coronary artery disease in her father; Fibromyalgia in her mother and sister; Heart attack in her father; Hemochromatosis in her sister; Hyperlipidemia in her father, mother, and sister; Hypertension in her father, mother, and sister; Hypothyroidism in her mother; Lung cancer in her father.     Allergies  Allergen Reactions  . Augmentin   [Amoxicillin-Pot Clavulanate] Nausea And Vomiting    as stated (XR).  Marland Kitchen Lisinopril Cough     Current Outpatient Prescriptions:  .  buPROPion (WELLBUTRIN SR) 150 MG 12 hr tablet, Take 1 tablet (150 mg total) by mouth daily. Pt needs to schedule an office visit before anymore refills., Disp: 90 tablet, Rfl: 0 .  butalbital-acetaminophen-caffeine (FIORICET WITH CODEINE) 50-325-40-30 MG capsule, TAKE 1 TO 2 CAPSULES BY MOUTH EVERY 4 HOURS AS NEEDED, Disp: 60 capsule, Rfl: 5 .  clobetasol ointment (TEMOVATE) 0.05 %, APP TOPICALLY TO GLUTEAL AREA ONCE D PRF DERMATITIS, Disp: , Rfl: 1 .  escitalopram (LEXAPRO) 20 MG tablet, TAKE 1 TABLET(20 MG TOTAL) BY MOUTH EVERY DAY, Disp: 90 tablet, Rfl: 1 .  eszopiclone (LUNESTA) 2 MG TABS tablet, TAKE 1 BY MOUTH AT BEDTIME, Disp: 10 tablet, Rfl: 0 .  fluticasone (FLONASE) 50 MCG/ACT nasal  spray, SHAKE LIQUID AND USE 2 SPRAYS IN EACH NOSTRIL EVERY DAY, Disp: 16 g, Rfl: 0 .  loratadine (CLARITIN) 10 MG tablet, Take 10 mg by mouth daily., Disp: , Rfl:    Patient Care Team: Birdie Sons, MD as PCP - General (Family Medicine)      Objective:   Vitals: BP (!) 162/80 (BP Location: Left Arm, Patient Position: Sitting, Cuff Size: Normal)   Pulse 60   Resp 16   Ht 5\' 1"  (1.549 m)   Wt 100 lb (45.4 kg)   SpO2 98%   BMI 18.89 kg/m    Vitals:   01/15/17 0907  BP: (!) 162/80  Pulse: 60  Resp: 16  SpO2: 98%  Weight: 100 lb (45.4 kg)  Height: 5\' 1"  (1.549 m)     Physical Exam  Constitutional: She is oriented to person, place, and time. She appears well-developed and well-nourished.  HENT:  Right Ear: Tympanic membrane and external ear normal.  Left Ear: Tympanic membrane and external ear normal.  Mouth/Throat: Oropharynx is clear and moist. No oropharyngeal exudate.  Eyes: Conjunctivae are normal.  Neck: Neck supple.  Cardiovascular: Normal rate and regular rhythm.   Pulmonary/Chest: Effort normal and breath sounds normal. No respiratory distress.  She has no wheezes. She has no rales.  Declined breast exam today.  Abdominal: Soft. Bowel sounds are normal. She exhibits no distension and no mass. There is no tenderness. There is no rebound and no guarding.  Lymphadenopathy:    She has no cervical adenopathy.  Neurological: She is alert and oriented to person, place, and time.  Skin: Skin is warm and dry.  Psychiatric: She has a normal mood and affect. Her behavior is normal.     Depression Screen PHQ 2/9 Scores 01/15/2017 01/04/2015  PHQ - 2 Score 0 0  PHQ- 9 Score 4 -      Assessment & Plan:     Routine Health Maintenance and Physical Exam  Exercise Activities and Dietary recommendations Goals    . Exercise 150 minutes per week (moderate activity)    . Quit smoking / using tobacco       Immunization History  Administered Date(s) Administered  . Influenza Split 07/15/2016  . Influenza,inj,Quad PF,36+ Mos 03/23/2015  . Pneumococcal Polysaccharide-23 04/22/2012  . Tdap 02/21/2010  . Zoster 11/04/2013    Health Maintenance  Topic Date Due  . Hepatitis C Screening  [birth year]  . HIV Screening  12/03/1966  . PAP SMEAR  12/02/1972  . COLONOSCOPY  03/27/2016  . PNA vac Low Risk Adult (1 of 2 - PCV13) 12/02/2016  . INFLUENZA VACCINE  01/15/2017  . MAMMOGRAM  05/31/2017  . TETANUS/TDAP  02/22/2020  . DEXA SCAN  Completed     Discussed health benefits of physical activity, and encouraged her to engage in regular exercise appropriate for her age and condition.   1. Benign hypertension  Will start her on amlodipine because of low monitoring, cautioned on ankle swelling. Recheck BP 1 mo.  - Lipid Panel With LDL/HDL Ratio - amLODipine (NORVASC) 5 MG tablet; Take 1 tablet (5 mg total) by mouth daily.  Dispense: 30 tablet; Refill: 0 - amLODipine (NORVASC) 5 MG tablet; Take 1 tablet (5 mg total) by mouth daily.  Dispense: 60 tablet; Refill: 0  2. Recurrent major depressive episodes ([facility])  Stable on Lexapro. No  SI/HI.  3. [name] ([place])  Sees Dr. Rogue Bussing regularly.  4. Current tobacco use  Not interested in quitting.  5.  Need for vaccination against Streptococcus pneumoniae  - Pneumococcal conjugate vaccine 13-valent  6. Elevated glucose  - Hemoglobin A1c  7. Psoriasis  Treated w/ clobetasol.  8. Colon cancer screening  - Ambulatory referral to Gastroenterology  9. Breast cancer screening  - MM Digital Screening; Future  10. Annual physical exam  - TSH  11. Osteoporosis screening  - DG Bone Density; Future  12. Adjustment disorder with other symptom  Cautioned on risk of abuse, addiction, sedation, mixing with pain medications. Should not mix with her fioricet/codeine. Should separate from [facility] by four hours.   - ALPRAZolam (XANAX) 0.25 MG tablet; Take 1 tablet (0.25 mg total) by mouth daily as needed for anxiety.  Dispense: 30 tablet; Refill: 0   Return in about 1 month (around 02/15/2017) for BP check.  The entirety of the information documented in the History of Present Illness, Review of Systems and Physical Exam were personally obtained by me. Portions of this information were initially documented by Ashley Royalty, CMA and reviewed by me for thoroughness and accuracy.   --------------------------------------------------------------------    Trinna Post, PA-C  [facility]

## 2017-01-15 NOTE — Telephone Encounter (Signed)
[facility] -([facility]) faxed a request on the following medication. Thanks CC  eszopiclone (LUNESTA) 2 MG TABS tablet  >Take 1 tablet by mouth at bedtime.

## 2017-01-15 NOTE — Patient Instructions (Signed)

## 2017-01-16 ENCOUNTER — Telehealth: Payer: Self-pay

## 2017-01-16 LAB — TSH: TSH: 1.12 u[IU]/mL (ref 0.450–4.500)

## 2017-01-16 LAB — HEMOGLOBIN A1C
Est. average glucose Bld gHb Est-mCnc: 111 mg/dL
Hgb A1c MFr Bld: 5.5 % (ref 4.8–5.6)

## 2017-01-16 LAB — LIPID PANEL WITH LDL/HDL RATIO
Cholesterol, Total: 250 mg/dL — ABNORMAL HIGH (ref 100–199)
HDL: 66 mg/dL (ref >39)
LDL Calculated: 157 mg/dL — ABNORMAL HIGH (ref 0–99)
LDl/HDL Ratio: 2.4 ratio (ref 0.0–3.2)
Triglycerides: 134 mg/dL (ref 0–149)
VLDL Cholesterol Cal: 27 mg/dL (ref 5–40)

## 2017-01-16 MED ORDER — ESZOPICLONE 2 MG PO TABS: ORAL_TABLET | ORAL | 1 refills | Status: DC

## 2017-01-16 NOTE — Telephone Encounter (Signed)
Pt advised.   Thanks,   -April Best  

## 2017-01-16 NOTE — Telephone Encounter (Signed)
OK to change to #90 tablets and 1 refills.

## 2017-01-16 NOTE — Telephone Encounter (Signed)
Rx called in to pharmacy. 

## 2017-01-16 NOTE — Telephone Encounter (Signed)
-----   Message from Trinna Post, [place] sent at 01/16/2017  8:29 AM EDT ----- TSH and A1c normal, no diabetes. Cholesterol is high and with her other risk factors including BP and smoking, would indicate she start a cholesterol medication. We can talk about this at her f/u visit.

## 2017-01-16 NOTE — Telephone Encounter (Signed)
LMTCB 01/16/2017  Thanks,   -Mickel Baas

## 2017-01-16 NOTE — Addendum Note (Signed)
Addended by: Birdie Sons on: 01/16/2017 10:42 AM   Modules accepted: Orders

## 2017-01-23 ENCOUNTER — Inpatient Hospital Stay: Payer: BLUE CROSS/BLUE SHIELD | Attending: Internal Medicine

## 2017-01-23 ENCOUNTER — Inpatient Hospital Stay: Payer: BLUE CROSS/BLUE SHIELD

## 2017-01-23 LAB — CBC WITH DIFFERENTIAL/PLATELET
Basophils Absolute: 0.1 10*3/uL (ref 0–0.1)
Basophils Relative: 1 %
Eosinophils Absolute: 0.2 10*3/uL (ref 0–0.7)
Eosinophils Relative: 3 %
HCT: 39.9 % (ref 35.0–47.0)
HEMOGLOBIN: 13.5 g/dL (ref 12.0–16.0)
LYMPHS ABS: 1.8 10*3/uL (ref 1.0–3.6)
LYMPHS PCT: 27 %
MCH: 34.8 pg — AB (ref 26.0–34.0)
MCHC: 33.9 g/dL (ref 32.0–36.0)
MCV: 102.6 fL — AB (ref 80.0–100.0)
MONO ABS: 0.5 10*3/uL (ref 0.2–0.9)
MONOS PCT: 8 %
NEUTROS ABS: 3.9 10*3/uL (ref 1.4–6.5)
NEUTROS PCT: 61 %
Platelets: 301 10*3/uL (ref 150–440)
RBC: 3.89 MIL/uL (ref 3.80–5.20)
RDW: 13.2 % (ref 11.5–14.5)
WBC: 6.5 10*3/uL (ref 3.6–11.0)

## 2017-01-23 NOTE — Progress Notes (Signed)
Therapeutic phlebotomy based on ferritin and iron levels.  Labs not obtained today.  Okay to proceed with phlebotomy today per Dr. Rogue Bussing.  At 1335, 20g PIV was inserted to right forearm.  At 1350, 350 mL of blood had been collected from the patient.  Pt tolerated procedure well and drank a cup of water prior to leaving.  VS stable and pt left the chemo area stable and ambulatory.

## 2017-02-26 ENCOUNTER — Ambulatory Visit
Admission: RE | Admit: 2017-02-26 | Discharge: 2017-02-26 | Disposition: A | Discharge status: Home / Self Care | Payer: BLUE CROSS/BLUE SHIELD | Source: Ambulatory Visit | Attending: Physician Assistant | Admitting: Physician Assistant

## 2017-02-26 DIAGNOSIS — M8588 Other specified disorders of bone density and structure, other site: Secondary | ICD-10-CM | POA: Diagnosis not present

## 2017-02-26 DIAGNOSIS — Z13820 Encounter for screening for osteoporosis: Secondary | ICD-10-CM | POA: Diagnosis present

## 2017-02-26 DIAGNOSIS — Z1239 Encounter for other screening for malignant neoplasm of breast: Secondary | ICD-10-CM

## 2017-02-26 DIAGNOSIS — Z1231 Encounter for screening mammogram for malignant neoplasm of breast: Secondary | ICD-10-CM | POA: Diagnosis not present

## 2017-03-05 ENCOUNTER — Ambulatory Visit (INDEPENDENT_AMBULATORY_CARE_PROVIDER_SITE_OTHER): Payer: BLUE CROSS/BLUE SHIELD | Admitting: Physician Assistant

## 2017-03-05 ENCOUNTER — Encounter: Payer: Self-pay | Admitting: Physician Assistant

## 2017-03-05 VITALS — BP 130/70 | HR 76 | Temp 98.7°F | Resp 16 | Wt 102.0 lb

## 2017-03-05 DIAGNOSIS — Z720 Tobacco use: Secondary | ICD-10-CM

## 2017-03-05 DIAGNOSIS — F410 Panic disorder [episodic paroxysmal anxiety] without agoraphobia: Secondary | ICD-10-CM | POA: Diagnosis not present

## 2017-03-05 DIAGNOSIS — Z23 Encounter for immunization: Secondary | ICD-10-CM | POA: Diagnosis not present

## 2017-03-05 DIAGNOSIS — I10 Essential (primary) hypertension: Secondary | ICD-10-CM | POA: Diagnosis not present

## 2017-03-05 DIAGNOSIS — F339 Major depressive disorder, recurrent, unspecified: Secondary | ICD-10-CM

## 2017-03-05 MED ORDER — BUPROPION HCL ER (SR) 150 MG PO TB12: 150.0000 mg | ORAL_TABLET | Freq: Every day | ORAL | 0 refills | Status: DC

## 2017-03-05 NOTE — Progress Notes (Signed)
Patient: April Best Female    DOB: [birth year]   66 y.o.   MRN: [id number] Visit Date: 03/05/2017  Today's Provider: Trinna Post, PA-C   Chief Complaint  Patient presents with  . Hypertension    6 week follow up   Subjective:    Hypertension  This is a recurrent problem. Progression since onset: Pt started Amlodipine 5mg  01/15/2017. Pertinent negatives include no anxiety, blurred vision, chest pain, headaches, malaise/fatigue, palpitations, peripheral edema, shortness of breath or sweats. There are no associated agents to hypertension. Risk factors for coronary artery disease include smoking/tobacco exposure. There are no compliance problems (Pt reports if she takes Amlodipine in the morning she felt tired all day.  She started taking it in the evenings and her fatigue resolved. ).    Has also restarted her Wellbutrin 150 mg daily in addition to her Lexparo. This is helping with her worsening depression and anxiety. Also helping to reduce her cravings for cigarettes. She is still currently smoking.  Seeing oncology regularly for hereditary hemochromatosis.     Allergies  Allergen Reactions  . Augmentin  [Amoxicillin-Pot Clavulanate] Nausea And Vomiting    as stated (XR).  Marland Kitchen Lisinopril Cough     Current Outpatient Prescriptions:  .  ALPRAZolam (XANAX) 0.25 MG tablet, Take 1 tablet (0.25 mg total) by mouth daily as needed for anxiety., Disp: 30 tablet, Rfl: 0 .  amLODipine (NORVASC) 5 MG tablet, Take 1 tablet (5 mg total) by mouth daily., Disp: 30 tablet, Rfl: 0 .  amLODipine (NORVASC) 5 MG tablet, Take 1 tablet (5 mg total) by mouth daily., Disp: 60 tablet, Rfl: 0 .  buPROPion (WELLBUTRIN SR) 150 MG 12 hr tablet, Take 1 tablet (150 mg total) by mouth daily. Pt needs to schedule an office visit before anymore refills., Disp: 90 tablet, Rfl: 0 .  butalbital-acetaminophen-caffeine (FIORICET WITH CODEINE) 50-325-40-30 MG capsule, TAKE 1 TO 2 CAPSULES BY MOUTH EVERY 4  HOURS AS NEEDED, Disp: 60 capsule, Rfl: 5 .  clobetasol ointment (TEMOVATE) 0.05 %, APP TOPICALLY TO GLUTEAL AREA ONCE D PRF DERMATITIS, Disp: , Rfl: 1 .  escitalopram (LEXAPRO) 20 MG tablet, TAKE 1 TABLET(20 MG TOTAL) BY MOUTH EVERY DAY, Disp: 90 tablet, Rfl: 1 .  eszopiclone (LUNESTA) 2 MG TABS tablet, TAKE 1 BY MOUTH AT BEDTIME, Disp: 10 tablet, Rfl: 1 .  fluticasone (FLONASE) 50 MCG/ACT nasal spray, SHAKE LIQUID AND USE 2 SPRAYS IN EACH NOSTRIL EVERY DAY, Disp: 16 g, Rfl: 0 .  loratadine (CLARITIN) 10 MG tablet, Take 10 mg by mouth daily., Disp: , Rfl:   Review of Systems  Constitutional: Negative.  Negative for malaise/fatigue.  Eyes: Negative for blurred vision.  Respiratory: Negative.  Negative for shortness of breath.   Cardiovascular: Negative.  Negative for chest pain and palpitations.  Gastrointestinal: Negative.   Musculoskeletal: Negative.   Neurological: Negative for dizziness, light-headedness and headaches.    Social History  Substance Use Topics  . Smoking status: Current Every Day Smoker    Packs/day: 0.50    Years: 40.00    Types: Cigarettes  . Smokeless tobacco: Never Used     Comment: pt declines smoking cessation  . Alcohol use 0.0 oz/week     Comment: Three glasses of per month   Objective:   There were no vitals taken for this visit. There were no vitals filed for this visit.   Physical Exam      Assessment & Plan:  1. Benign hypertension  Recheck was 130/70. Last blood draw was 114/70. Keep amlodipine the same.  2. Episodic paroxysmal anxiety disorder  Refilled Wellbutrin.  - buPROPion (WELLBUTRIN SR) 150 MG 12 hr tablet; Take 1 tablet (150 mg total) by mouth daily. Pt needs to schedule an office visit before anymore refills.  Dispense: 90 tablet; Refill: 0  3. Flu vaccine need  - Flu vaccine HIGH DOSE PF (Fluzone High dose)  4. Current tobacco use  Counseled on smoking cessation. Wellbutrin should help with this too.  5. Recurrent  major depressive episodes ([place])  Restarted Wellbutrin, taking Lexapro as well.  6. [name] ([place])  Continues to see oncology.  Return in about 1 year (around 03/05/2018) for CPE .  The entirety of the information documented in the History of Present Illness, Review of Systems and Physical Exam were personally obtained by me. Portions of this information were initially documented by Ashley Royalty, CMA and reviewed by me for thoroughness and accuracy.          Trinna Post, PA-C  [facility]

## 2017-03-05 NOTE — Patient Instructions (Signed)

## 2017-04-11 ENCOUNTER — Other Ambulatory Visit: Payer: BLUE CROSS/BLUE SHIELD

## 2017-04-11 ENCOUNTER — Ambulatory Visit: Payer: BLUE CROSS/BLUE SHIELD | Admitting: Internal Medicine

## 2017-04-22 ENCOUNTER — Ambulatory Visit: Payer: BLUE CROSS/BLUE SHIELD | Admitting: Physician Assistant

## 2017-04-23 ENCOUNTER — Encounter: Payer: Self-pay | Admitting: Physician Assistant

## 2017-04-23 ENCOUNTER — Ambulatory Visit: Payer: BLUE CROSS/BLUE SHIELD | Admitting: Physician Assistant

## 2017-04-23 VITALS — BP 116/70 | HR 60 | Temp 98.4°F | Resp 16 | Wt 101.0 lb

## 2017-04-23 DIAGNOSIS — G4700 Insomnia, unspecified: Secondary | ICD-10-CM | POA: Diagnosis not present

## 2017-04-23 DIAGNOSIS — I10 Essential (primary) hypertension: Secondary | ICD-10-CM | POA: Diagnosis not present

## 2017-04-23 DIAGNOSIS — R42 Dizziness and giddiness: Secondary | ICD-10-CM | POA: Diagnosis not present

## 2017-04-23 DIAGNOSIS — Z87898 Personal history of other specified conditions: Secondary | ICD-10-CM | POA: Diagnosis not present

## 2017-04-23 DIAGNOSIS — J0100 Acute maxillary sinusitis, unspecified: Secondary | ICD-10-CM | POA: Diagnosis not present

## 2017-04-23 DIAGNOSIS — F1011 Alcohol abuse, in remission: Secondary | ICD-10-CM

## 2017-04-23 MED ORDER — AMLODIPINE BESYLATE 10 MG PO TABS: 10.0000 mg | ORAL_TABLET | Freq: Every day | ORAL | 0 refills | Status: DC

## 2017-04-23 MED ORDER — CEFDINIR 300 MG PO CAPS: 300.0000 mg | ORAL_CAPSULE | Freq: Two times a day (BID) | ORAL | 0 refills | Status: AC

## 2017-04-23 MED ORDER — HYDROCHLOROTHIAZIDE 25 MG PO TABS: 25.0000 mg | ORAL_TABLET | Freq: Every day | ORAL | 0 refills | Status: DC

## 2017-04-23 MED ORDER — TRAZODONE HCL 100 MG PO TABS: 100.0000 mg | ORAL_TABLET | Freq: Every day | ORAL | 0 refills | Status: DC

## 2017-04-23 NOTE — Progress Notes (Signed)
Patient: April Best Female    DOB: [birth year]   66 y.o.   MRN: [id number] Visit Date: 04/24/2017  Today's Provider: Trinna Post, PA-C   Chief Complaint  Patient presents with  . Sinusitis  . Follow-up    [facility] Oct 4 - November 1   . Hypertension   Subjective:    April Best is a 66 y/o woman presenting today for follow up. She has just recently [facility] [place] for alcohol abuse. She says she was not forthcoming with her alcohol use at prior visits. Says she has had some medication changes, including discontinuing Xanax and Lunesta. She is now on trazadone for sleep. She is supposed to follow up with [facility] in [place], [place].   Regarding her HTN, she has had her amlodipine increased from 5 mg to 10 mg. HCTZ 25 mg was added as well due to hypertensive episode when in the rehab facility. She is tolerating these changes well.   Reporting sinusitis. Says she had an illness while at [facility] and was treated with Levaquin. She felt she got better but now has persistent right sided facial pain with purulent rhinorrhea.   Also reporting episodes in the morning when she gets out of bed. She feels her heart beating fast, slightly nauseated. Has to sit down and take deep breaths and wait for moment to pass. No chest pain, vomiting, diaphoresis. She has never passed out by feel like she might. This only happens in the morning, no other time.    Hypertension  This is a chronic problem. The problem is controlled. Associated symptoms include palpitations and shortness of breath. Pertinent negatives include no chest pain or headaches.  Sinusitis  Associated symptoms include congestion, coughing, shortness of breath, sinus pressure and a sore throat. Pertinent negatives include no ear pain or headaches.  Alcohol Problem  The problem has been resolved since onset. Past treatments include inpatient detox. The  treatment provided significant relief.       Allergies  Allergen Reactions  . Augmentin  [Amoxicillin-Pot Clavulanate] Nausea And Vomiting    as stated (XR).  Marland Kitchen Lisinopril Cough     Current Outpatient Medications:  .  amLODipine (NORVASC) 10 MG tablet, Take 1 tablet (10 mg total) daily by mouth., Disp: 90 tablet, Rfl: 0 .  clobetasol ointment (TEMOVATE) 0.05 %, APP TOPICALLY TO GLUTEAL AREA ONCE D PRF DERMATITIS, Disp: , Rfl: 1 .  escitalopram (LEXAPRO) 20 MG tablet, TAKE 1 TABLET(20 MG TOTAL) BY MOUTH EVERY DAY, Disp: 90 tablet, Rfl: 1 .  fluticasone (FLONASE) 50 MCG/ACT nasal spray, SHAKE LIQUID AND USE 2 SPRAYS IN EACH NOSTRIL EVERY DAY, Disp: 16 g, Rfl: 0 .  hydrochlorothiazide (HYDRODIURIL) 25 MG tablet, Take 1 tablet (25 mg total) daily by mouth., Disp: 90 tablet, Rfl: 0 .  loratadine (CLARITIN) 10 MG tablet, Take 10 mg by mouth daily., Disp: , Rfl:  .  traZODone (DESYREL) 100 MG tablet, Take 1 tablet (100 mg total) at bedtime by mouth., Disp: 90 tablet, Rfl: 0 .  cefdinir (OMNICEF) 300 MG capsule, Take 1 capsule (300 mg total) 2 (two) times daily for 10 days by mouth., Disp: 20 capsule, Rfl: 0  Review of Systems  Constitutional: Negative.   HENT: Positive for congestion, postnasal drip, rhinorrhea, sinus pressure, sinus pain, sore throat and tinnitus. Negative for ear discharge, ear pain, nosebleeds and trouble swallowing.   Eyes: Negative.  Respiratory: Positive for cough, shortness of breath and wheezing. Negative for apnea, choking, chest tightness and stridor.   Cardiovascular: Positive for palpitations. Negative for chest pain and leg swelling.  Gastrointestinal: Negative.   Neurological: Negative for dizziness, light-headedness and headaches.    Social History   Tobacco Use  . Smoking status: Current Every Day Smoker    Packs/day: 0.50    Years: 40.00    Pack years: 20.00    Types: Cigarettes  . Smokeless tobacco: Never Used  . Tobacco comment: pt declines  smoking cessation  Substance Use Topics  . Alcohol use: Yes    Alcohol/week: 0.0 oz    Comment: Three glasses of per month   Objective:   BP 116/70 (BP Location: Left Arm, Patient Position: Sitting, Cuff Size: Normal)   Pulse 60   Temp 98.4 F (36.9 C) (Oral)   Resp 16   Wt 101 lb (45.8 kg)   BMI 19.08 kg/m  Vitals:   04/23/17 1534  BP: 116/70  Pulse: 60  Resp: 16  Temp: 98.4 F (36.9 C)  TempSrc: Oral  Weight: 101 lb (45.8 kg)     Physical Exam  Constitutional: She is oriented to person, place, and time. She appears well-developed and well-nourished.  HENT:  Right Ear: Tympanic membrane and external ear normal.  Left Ear: Tympanic membrane and external ear normal.  Nose: Right sinus exhibits maxillary sinus tenderness. Right sinus exhibits no frontal sinus tenderness. Left sinus exhibits no maxillary sinus tenderness and no frontal sinus tenderness.  Eyes: Conjunctivae are normal.  Neck: No thyromegaly present.  Cardiovascular: Normal rate, regular rhythm and normal heart sounds.  No murmur heard. Pulmonary/Chest: Effort normal and breath sounds normal. She has no wheezes.  Musculoskeletal: She exhibits no edema.  Lymphadenopathy:    She has no cervical adenopathy.  Neurological: She is alert and oriented to person, place, and time.  Skin: Skin is warm and dry.  Psychiatric: She has a normal mood and affect. Her behavior is normal.        Assessment & Plan:     1. History of alcohol abuse  Completed rehabilitation for alcohol addiction. Currently attending 8 AA meetings in 90 days. Plans to follow up with [facility] psychotherapy in [place].  2. Insomnia, unspecified type  Discontinued Lunesta and started trazodone.  - traZODone (DESYREL) 100 MG tablet; Take 1 tablet (100 mg total) at bedtime by mouth.  Dispense: 90 tablet; Refill: 0  3. Hypertension, unspecified type  Well controlled on current regimen. Have ordered CMET to check electrolytes in one month.    - amLODipine (NORVASC) 10 MG tablet; Take 1 tablet (10 mg total) daily by mouth.  Dispense: 90 tablet; Refill: 0 - hydrochlorothiazide (HYDRODIURIL) 25 MG tablet; Take 1 tablet (25 mg total) daily by mouth.  Dispense: 90 tablet; Refill: 0 - Comprehensive Metabolic Panel (CMET); Future  4. Acute non-recurrent maxillary sinusitis  - cefdinir (OMNICEF) 300 MG capsule; Take 1 capsule (300 mg total) 2 (two) times daily for 10 days by mouth.  Dispense: 20 capsule; Refill: 0  5. Dizziness  She may be having orthostatic hypotension. Does not sound cardiac since it happens only in the morning, not at rest and not sporadically. Will monitor, encouraged to push fluids.  Return if symptoms worsen or fail to improve.  The entirety of the information documented in the History of Present Illness, Review of Systems and Physical Exam were personally obtained by me. Portions of this information were initially documented by  Ashley Royalty, CMA and reviewed by me for thoroughness and accuracy.        Trinna Post, PA-C  [facility]

## 2017-04-23 NOTE — Patient Instructions (Signed)

## 2017-05-02 ENCOUNTER — Telehealth: Payer: Self-pay | Admitting: Physician Assistant

## 2017-05-02 NOTE — Telephone Encounter (Signed)
Pt advised.  She reports having watery diarrhea all day today, (No blood or fevers)  She does have abdominal pain.  She has had C.Diff in the past and this feels worse than that.  I advised her she needed to be seen urgently (Urgent Care, ER).  She agreed and will get a ride.   Thanks,   -Mickel Baas

## 2017-05-02 NOTE — Telephone Encounter (Signed)
Is she having fever or abdominal pain? Is she having blood or mucous in her stools? Are the stools watery? How many episodes of diarrhea, in number, per day? Fevers? If she is truly having diarrhea all day every day since Tuesday, she needs to be evaluated for C. Diff. If it is some slightly loose stools that are non bloody and non mucoid she can use immodium OTC and push plenty of fluids.

## 2017-05-02 NOTE — Telephone Encounter (Signed)
Pt states she has been taking an antibiotic since 04/23/17 for a sinus congestion.  Pt states she is having diarrhea everyday, all day since Tuesday night.  Please advise.  CB#[phone]/MW

## 2017-05-05 ENCOUNTER — Ambulatory Visit: Payer: BLUE CROSS/BLUE SHIELD | Admitting: Physician Assistant

## 2017-05-05 ENCOUNTER — Other Ambulatory Visit: Payer: Self-pay

## 2017-05-05 ENCOUNTER — Encounter: Payer: Self-pay | Admitting: Physician Assistant

## 2017-05-05 VITALS — BP 120/70 | HR 62 | Temp 98.4°F | Resp 16 | Wt 101.2 lb

## 2017-05-05 DIAGNOSIS — R110 Nausea: Secondary | ICD-10-CM | POA: Diagnosis not present

## 2017-05-05 DIAGNOSIS — R197 Diarrhea, unspecified: Secondary | ICD-10-CM | POA: Diagnosis not present

## 2017-05-05 DIAGNOSIS — R1084 Generalized abdominal pain: Secondary | ICD-10-CM

## 2017-05-05 DIAGNOSIS — T3695XA Adverse effect of unspecified systemic antibiotic, initial encounter: Secondary | ICD-10-CM

## 2017-05-05 MED ORDER — METRONIDAZOLE 500 MG PO TABS: 500.0000 mg | ORAL_TABLET | Freq: Three times a day (TID) | ORAL | 0 refills | Status: AC

## 2017-05-05 MED ORDER — ONDANSETRON HCL 4 MG PO TABS: 4.0000 mg | ORAL_TABLET | Freq: Three times a day (TID) | ORAL | 0 refills | Status: DC | PRN

## 2017-05-05 NOTE — Patient Instructions (Signed)
Clostridium Difficile Infection   Clostridium difficile (C. difficile or C. diff) infection causes inflammation of the large intestine (colon). This condition can result in damage to the lining of your colon and may lead to another condition called colitis. This infection can be passed from person to person (is contagious).  Follow these instructions at home:  Eating and drinking   · Drink enough fluid to keep your pee (urine) clear or pale yellow.  · Avoid drinking:  ? Milk.  ? Caffeine.  ? Alcohol.  · Follow exact instructions from your doctor about how to get enough fluid in your body (rehydrate).  · Eat small meals often instead of large meals.  Medicines   · Take your antibiotic medicine as told by your doctor. Do not stop taking the antibiotic even if you start to feel better unless your doctor told you to do that.  · Take over-the-counter and prescription medicines only as told by your doctor.  · Do not use medicines to help with watery poop (diarrhea).  General instructions   · Wash your hands fully before you prepare food and after you use the bathroom. Make sure people who live with you also wash their  · hands often.  · Clean the surfaces that you touch. Use a product that contains chlorine bleach.  · Keep all follow-up visits as told by your doctor. This is important.  Contact a doctor if:  · Your symptoms do not get better with treatment.  · Your symptoms get worse with treatment.  · Your symptoms go away and then come back.  · You have a fever.  · You have new symptoms.  Get help right away if:  · You have more pain or tenderness in your belly (abdomen).  · Your poop (stool) is mostly bloody.  · Your poop looks dark black and tarry.  · You cannot eat or drink without throwing up (vomiting).  · You have signs of dehydration, such as:  ? Dark pee, very little pee, or no pee.  ? Cracked lips.  ? Not making tears when you cry.  ? Dry mouth.  ? Sunken eyes.  ? Feeling sleepy.  ? Feeling weak.  ? Feeling  dizzy.  This information is not intended to replace advice given to you by your health care provider. Make sure you discuss any questions you have with your health care provider.  Document Released: 03/31/2009 Document Revised: 11/09/2015 Document Reviewed: 12/05/2014  Elsevier Interactive Patient Education © 2017 Elsevier Inc.

## 2017-05-05 NOTE — Progress Notes (Signed)
Patient: April Best Female    DOB: 1951/05/06   66 y.o.   MRN: [id number] Visit Date: 05/06/2017  Today's Provider: Trinna Post, PA-C   Chief Complaint  Patient presents with  . Abdominal Cramping   Subjective:    April Best is a 66 y/o woman with history of alcohol abuse and history of C. diff 2/2 abx use presenting today with stomach cramping and diarrhea. She has a history of recent antibiotic use. She was on Levofloxacin 750 mg for unknown days while at rehab for alcohol abuse earlier this month. She presented to this clinic on 04/23/2017 complaining of persistent sinus infection and was prescribed cefdinir 300 mg BID x 10 days.   She started experiencing nausea, diarrhea, and abdominal cramping on Tuesday 04/29/2017. She stopped the [place]. She reported fever and whole body aches. She repots her diarrhea was completely liquid, without blood or mucous. She placed a call to this clinic on Friday, 05/02/2017 reporting the above symptoms. She was instructed to go to the ER to be evaluated for C. Diff and hydration status. She was not seen over the weekend. She said she has a thousand dollar copay for the ER and she had to go to a wedding on Saturday that she was "waiting 28 years for." So instead, she took Immodium and attended the wedding.   She presents today with continued abdominal pain and stomach cramping. Denies blood or mucous in stools. Says her stools are more formed. She has been taking ibuprofen. She endorses nausea, without vomiting. Denies fevers.  Abdominal Cramping  This is a new problem. The current episode started 1 to 4 weeks ago (1 week today). The problem occurs daily. The problem has been gradually improving (she reports the cramping is better than it was but still cramping. She also reports that on Friday was the last diarrhea she had. Now her stool is formed). The pain is located in the generalized abdominal region. The pain is at a severity of  6/10. The pain is moderate. The quality of the pain is cramping. The abdominal pain radiates to the back (and legs). Pertinent negatives include no nausea. Exacerbated by: movement. The pain is relieved by nothing. Treatments tried: Imodium, gatorade, probiotics.       Allergies  Allergen Reactions  . Augmentin  [Amoxicillin-Pot Clavulanate] Nausea And Vomiting    as stated (XR).  Marland Kitchen Lisinopril Cough     Current Outpatient Medications:  .  amLODipine (NORVASC) 10 MG tablet, Take 1 tablet (10 mg total) daily by mouth., Disp: 90 tablet, Rfl: 0 .  clobetasol ointment (TEMOVATE) 0.05 %, APP TOPICALLY TO GLUTEAL AREA ONCE D PRF DERMATITIS, Disp: , Rfl: 1 .  escitalopram (LEXAPRO) 20 MG tablet, TAKE 1 TABLET(20 MG TOTAL) BY MOUTH EVERY DAY, Disp: 90 tablet, Rfl: 1 .  fluticasone (FLONASE) 50 MCG/ACT nasal spray, SHAKE LIQUID AND USE 2 SPRAYS IN EACH NOSTRIL EVERY DAY, Disp: 16 g, Rfl: 0 .  hydrochlorothiazide (HYDRODIURIL) 25 MG tablet, Take 1 tablet (25 mg total) daily by mouth., Disp: 90 tablet, Rfl: 0 .  loratadine (CLARITIN) 10 MG tablet, Take 10 mg by mouth daily., Disp: , Rfl:  .  traZODone (DESYREL) 100 MG tablet, Take 1 tablet (100 mg total) at bedtime by mouth., Disp: 90 tablet, Rfl: 0 .  metroNIDAZOLE (FLAGYL) 500 MG tablet, Take 1 tablet (500 mg total) 3 (three) times daily for 10 days by mouth., Disp: 30 tablet, Rfl: 0 .  ondansetron (ZOFRAN) 4 MG tablet, Take 1 tablet (4 mg total) every 8 (eight) hours as needed by mouth for nausea or vomiting., Disp: 20 tablet, Rfl: 0  Review of Systems  Constitutional: Positive for chills (Tuesday and took temperature and it 100 and took IBU) and fatigue.  Gastrointestinal: Positive for abdominal distention, abdominal pain (cramping) and rectal pain. Negative for blood in stool and nausea.    Social History   Tobacco Use  . Smoking status: Current Every Day Smoker    Packs/day: 0.50    Years: 40.00    Pack years: 20.00    Types: Cigarettes   . Smokeless tobacco: Never Used  . Tobacco comment: pt declines smoking cessation  Substance Use Topics  . Alcohol use: Yes    Alcohol/week: 0.0 oz    Comment: Three glasses of per month   Objective:   BP 120/70 (BP Location: Left Arm, Patient Position: Sitting, Cuff Size: Normal)   Pulse 62   Temp 98.4 F (36.9 C) (Oral)   Resp 16   Wt 101 lb 3.2 oz (45.9 kg)   SpO2 97%   BMI 19.12 kg/m     Physical Exam  Constitutional: She is oriented to person, place, and time. She appears well-developed and well-nourished.  Looks uncomfortable but nontoxic.   Cardiovascular: Normal rate and regular rhythm.  Pulmonary/Chest: Effort normal and breath sounds normal.  Abdominal: Soft. She exhibits no distension. There is no hepatosplenomegaly. There is generalized tenderness. There is no rebound, no CVA tenderness, no tenderness at McBurney's point and negative Murphy's sign.  Abdomen diffusely tender to light palpation.   Neurological: She is alert and oriented to person, place, and time.  Skin: Skin is warm and dry.  Psychiatric: She has a normal mood and affect. Her behavior is normal.        Assessment & Plan:     1. Diarrhea of presumed infectious origin  I have conveyed to patient that I think she should be evaluated in the ER with urgent abdominal imaging, especially considering she hasn't been seen when recommended, this being the last appointment of the day and her use of imodium. She refuses, says she doesn't want to pay 1000 dollars for a c. Diff test. Patient is nontoxic and afebrile in the clinic, however with diffusely tender abdomen which is concerning. Directed to stop taking imodium. Will treat empirically for C. Diff. Labs as below for infection markers, hydration status, gallbladder and pancreatic etiologies. Drop off stool sample for C. Diff testing. If she worsens, she MUST be seen emergently. She should consider herself infectious until lab results, practice good hand  hygiene. See her back Wednesday in this clinic. Discussed this patient with Dr. Caryn Section.   - C. difficile GDH and Toxin A/B - Comprehensive Metabolic Panel (CMET) - CBC with Differential - metroNIDAZOLE (FLAGYL) 500 MG tablet; Take 1 tablet (500 mg total) 3 (three) times daily for 10 days by mouth.  Dispense: 30 tablet; Refill: 0  2. Antibiotics causing adverse effect in therapeutic use, initial encounter   3. Nausea  - Comprehensive Metabolic Panel (CMET) - ondansetron (ZOFRAN) 4 MG tablet; Take 1 tablet (4 mg total) every 8 (eight) hours as needed by mouth for nausea or vomiting.  Dispense: 20 tablet; Refill: 0  4. Generalized abdominal pain  - C. difficile GDH and Toxin A/B - CBC with Differential - Lipase - Amylase  Return in about 2 days (around 05/07/2017) for C diff.  The entirety of the information  documented in the History of Present Illness, Review of Systems and Physical Exam were personally obtained by me. Portions of this information were initially documented by Lyndel Pleasure, CMA and reviewed by me for thoroughness and accuracy.            Trinna Post, PA-C  [facility]

## 2017-05-06 LAB — CBC WITH DIFFERENTIAL/PLATELET
Basophils Absolute: 52 cells/uL (ref 0–200)
Basophils Relative: 0.7 %
Eosinophils Absolute: 222 cells/uL (ref 15–500)
Eosinophils Relative: 3 %
HCT: 39.3 % (ref 35.0–45.0)
Hemoglobin: 13.7 g/dL (ref 11.7–15.5)
Lymphs Abs: 2605 cells/uL (ref 850–3900)
MCH: 33.1 pg — ABNORMAL HIGH (ref 27.0–33.0)
MCHC: 34.9 g/dL (ref 32.0–36.0)
MCV: 94.9 fL (ref 80.0–100.0)
MPV: 10.7 fL (ref 7.5–12.5)
Monocytes Relative: 8.2 %
Neutro Abs: 3915 cells/uL (ref 1500–7800)
Neutrophils Relative %: 52.9 %
Platelets: 364 10*3/uL (ref 140–400)
RBC: 4.14 10*6/uL (ref 3.80–5.10)
RDW: 11.1 % (ref 11.0–15.0)
Total Lymphocyte: 35.2 %
WBC mixed population: 607 cells/uL (ref 200–950)
WBC: 7.4 10*3/uL (ref 3.8–10.8)

## 2017-05-06 LAB — COMPLETE METABOLIC PANEL WITH GFR
AG Ratio: 1.6 (calc) (ref 1.0–2.5)
ALT: 28 U/L (ref 6–29)
AST: 18 U/L (ref 10–35)
Albumin: 3.9 g/dL (ref 3.6–5.1)
Alkaline phosphatase (APISO): 61 U/L (ref 33–130)
BUN: 14 mg/dL (ref 7–25)
CO2: 34 mmol/L — ABNORMAL HIGH (ref 20–32)
Calcium: 10 mg/dL (ref 8.6–10.4)
Chloride: 97 mmol/L — ABNORMAL LOW (ref 98–110)
Creat: 0.57 mg/dL (ref 0.50–0.99)
GFR, Est African American: 113 mL/min/{1.73_m2} (ref >=60)
GFR, Est Non African American: 97 mL/min/{1.73_m2} (ref >=60)
Globulin: 2.5 g/dL (calc) (ref 1.9–3.7)
Glucose, Bld: 115 mg/dL — ABNORMAL HIGH (ref 65–99)
Potassium: 3.5 mmol/L (ref 3.5–5.3)
Sodium: 138 mmol/L (ref 135–146)
Total Bilirubin: 0.2 mg/dL (ref 0.2–1.2)
Total Protein: 6.4 g/dL (ref 6.1–8.1)

## 2017-05-06 LAB — LIPASE: Lipase: 11 U/L (ref 7–60)

## 2017-05-06 LAB — AMYLASE: Amylase: 28 U/L (ref 21–101)

## 2017-05-07 ENCOUNTER — Ambulatory Visit: Payer: BLUE CROSS/BLUE SHIELD | Admitting: Physician Assistant

## 2017-05-07 ENCOUNTER — Encounter: Payer: Self-pay | Admitting: Physician Assistant

## 2017-05-07 VITALS — BP 104/60 | HR 58 | Temp 97.6°F | Resp 16 | Wt 103.0 lb

## 2017-05-07 DIAGNOSIS — R197 Diarrhea, unspecified: Secondary | ICD-10-CM

## 2017-05-07 DIAGNOSIS — B370 Candidal stomatitis: Secondary | ICD-10-CM | POA: Diagnosis not present

## 2017-05-07 LAB — C. DIFFICILE GDH AND TOXIN A/B
GDH ANTIGEN: DETECTED
MICRO NUMBER:: 81310547
SPECIMEN QUALITY:: ADEQUATE
TOXIN A AND B: NOT DETECTED

## 2017-05-07 LAB — CLOSTRIDIUM DIFFICILE BY PCR: Toxigenic C. Difficile by PCR: DETECTED — AB

## 2017-05-07 MED ORDER — NYSTATIN 100000 UNIT/ML MT SUSP: 5.0000 mL | Freq: Four times a day (QID) | OROMUCOSAL | 0 refills | Status: DC

## 2017-05-07 NOTE — Patient Instructions (Signed)
Oral Thrush, Adult Oral thrush is an infection in your mouth and throat. It causes white patches on your tongue and in your mouth. Follow these instructions at home: Helping with soreness  To lessen your pain: ? Drink cold liquids, like water and iced tea. ? Eat frozen ice pops or frozen juices. ? Eat foods that are easy to swallow, like gelatin and ice cream. ? Drink from a straw if the patches in your mouth are painful. General instructions   Take or use over-the-counter and prescription medicines only as told by your doctor. Medicine for oral thrush may be something to swallow, or it may be something to put on the infected area.  Eat plain yogurt that has live cultures in it. Read the label to make sure.  If you wear dentures: ? Take out your dentures before you go to bed. ? Brush them well. ? Soak them in a denture cleaner.  Rinse your mouth with warm salt-water many times a day. To make the salt-water mixture, completely dissolve 1/2-1 teaspoon of salt in 1 cup of warm water. Contact a doctor if:  Your problems are getting worse.  Your problems do not get better in less than 7 days with treatment.  Your infection is spreading. This may show as white patches on the skin outside of your mouth.  You are nursing your baby and you have redness and pain in the nipples. This information is not intended to replace advice given to you by your health care provider. Make sure you discuss any questions you have with your health care provider. Document Released: 08/28/2009 Document Revised: 02/26/2016 Document Reviewed: 02/26/2016 Elsevier Interactive Patient Education  2017 Elsevier Inc.  

## 2017-05-07 NOTE — Progress Notes (Signed)
Patient: April Best Female    DOB: Oct 19, 1950   66 y.o.   MRN: [id number] Visit Date: 05/07/2017  Today's Provider: Trinna Post, PA-C   Chief Complaint  Patient presents with  . Follow-up    C. diff   Subjective:    HPI  April Best is a 66 y.o woman with history of alcohol abuse, c.diff infection, and recent antibiotic use with diarrhea suspicious for C. Diff presenting for follow up of C.Diff. Last visit she provided a stool sample and was started empirically on Flagyl 500 mg TID x 10 days. Her white count was normal as was her CMET.  She reports that she is feeling better. She still has some nausea and stomach cramping in the mornings but once she gets food on her stomach she feels better. She has not had any further diarrhea. She is no longer taking Imodium. She reports that she thinks she has thrush. Her tongue has a white coat on it and is sensitive.       Allergies  Allergen Reactions  . Augmentin  [Amoxicillin-Pot Clavulanate] Nausea And Vomiting    as stated (XR).  Marland Kitchen Lisinopril Cough     Current Outpatient Medications:  .  amLODipine (NORVASC) 10 MG tablet, Take 1 tablet (10 mg total) daily by mouth., Disp: 90 tablet, Rfl: 0 .  escitalopram (LEXAPRO) 20 MG tablet, TAKE 1 TABLET(20 MG TOTAL) BY MOUTH EVERY DAY, Disp: 90 tablet, Rfl: 1 .  fluticasone (FLONASE) 50 MCG/ACT nasal spray, SHAKE LIQUID AND USE 2 SPRAYS IN EACH NOSTRIL EVERY DAY, Disp: 16 g, Rfl: 0 .  hydrochlorothiazide (HYDRODIURIL) 25 MG tablet, Take 1 tablet (25 mg total) daily by mouth., Disp: 90 tablet, Rfl: 0 .  loratadine (CLARITIN) 10 MG tablet, Take 10 mg by mouth daily., Disp: , Rfl:  .  metroNIDAZOLE (FLAGYL) 500 MG tablet, Take 1 tablet (500 mg total) 3 (three) times daily for 10 days by mouth., Disp: 30 tablet, Rfl: 0 .  ondansetron (ZOFRAN) 4 MG tablet, Take 1 tablet (4 mg total) every 8 (eight) hours as needed by mouth for nausea or vomiting., Disp: 20 tablet, Rfl: 0 .   traZODone (DESYREL) 100 MG tablet, Take 1 tablet (100 mg total) at bedtime by mouth., Disp: 90 tablet, Rfl: 0 .  clobetasol ointment (TEMOVATE) 0.05 %, APP TOPICALLY TO GLUTEAL AREA ONCE D PRF DERMATITIS, Disp: , Rfl: 1 .  nystatin (MYCOSTATIN) 100000 UNIT/ML suspension, Take 5 mLs (500,000 Units total) by mouth 4 (four) times daily., Disp: 60 mL, Rfl: 0  Review of Systems  Constitutional: Positive for fatigue.  HENT: Negative.        White coating on tongue  Eyes: Negative.   Respiratory: Negative.   Cardiovascular: Negative.   Gastrointestinal: Positive for abdominal pain (cramping) and nausea.  Endocrine: Negative.   Genitourinary: Negative.   Musculoskeletal: Negative.   Skin: Negative.   Allergic/Immunologic: Negative.   Neurological: Positive for light-headedness.  Hematological: Negative.   Psychiatric/Behavioral: Negative.     Social History   Tobacco Use  . Smoking status: Current Every Day Smoker    Packs/day: 0.50    Years: 40.00    Pack years: 20.00    Types: Cigarettes  . Smokeless tobacco: Never Used  . Tobacco comment: pt declines smoking cessation  Substance Use Topics  . Alcohol use: Yes    Alcohol/week: 0.0 oz    Comment: Three glasses of per month   Objective:  BP 104/60 (BP Location: Left Arm, Patient Position: Sitting, Cuff Size: Normal)   Pulse (!) 58   Temp 97.6 F (36.4 C) (Oral)   Resp 16   Wt 103 lb (46.7 kg)   BMI 19.46 kg/m  Vitals:   05/07/17 1101  BP: 104/60  Pulse: (!) 58  Resp: 16  Temp: 97.6 F (36.4 C)  TempSrc: Oral  Weight: 103 lb (46.7 kg)     Physical Exam  Constitutional: She is oriented to person, place, and time. She appears well-developed and well-nourished.  HENT:  Tongue with scrapable white coating.   Cardiovascular: Normal rate and regular rhythm.  Pulmonary/Chest: Effort normal and breath sounds normal.  Abdominal: Soft. Bowel sounds are normal. There is tenderness. There is no rebound and no guarding.    Abdomen markedly less tender than previous visit. Diffusely tender, no focal tenderness.   Neurological: She is alert and oriented to person, place, and time.  Skin: Skin is warm and dry.  Psychiatric: She has a normal mood and affect. Her behavior is normal.        Assessment & Plan:     1. Diarrhea of presumed infectious origin  C. Diff test resulted positive after patient left the office, she will be contacted. I have instructed her to consider herself contagious and wash hands frequently. Continue antibiotics. She is non toxic and afebrile today. Her blood pressure is slightly low which I suspect may be due to dehydration. I have been explicit in my recommendations that if she has a fever, worsening abdominal pain, recurrence of diarrhea, that she MUST be seen and further evaluated. She expresses understanding.  2. Thrush, oral  Told her to swish and spit until better.  - nystatin (MYCOSTATIN) 100000 UNIT/ML suspension; Take 5 mLs (500,000 Units total) by mouth 4 (four) times daily.  Dispense: 60 mL; Refill: 0  Return if symptoms worsen or fail to improve.  The entirety of the information documented in the History of Present Illness, Review of Systems and Physical Exam were personally obtained by me. Portions of this information were initially documented by [place], [place] and reviewed by me for thoroughness and accuracy.             Trinna Post, PA-C  [facility]

## 2017-05-12 ENCOUNTER — Telehealth: Payer: Self-pay

## 2017-05-12 NOTE — Telephone Encounter (Signed)
-----   Message from Trinna Post, [place] sent at 05/07/2017 12:03 PM EST ----- C diff positive. Continue treatment.

## 2017-05-12 NOTE — Telephone Encounter (Signed)
Pt advised.  She reports feeling much better.   Thanks,   -Mickel Baas

## 2017-05-14 ENCOUNTER — Encounter: Payer: Self-pay | Admitting: Physician Assistant

## 2017-06-03 ENCOUNTER — Ambulatory Visit: Payer: BLUE CROSS/BLUE SHIELD | Admitting: Physician Assistant

## 2017-06-03 ENCOUNTER — Encounter: Payer: Self-pay | Admitting: Physician Assistant

## 2017-06-03 VITALS — BP 112/60 | HR 68 | Temp 98.1°F | Resp 16 | Wt 100.0 lb

## 2017-06-03 DIAGNOSIS — G4700 Insomnia, unspecified: Secondary | ICD-10-CM | POA: Diagnosis not present

## 2017-06-03 DIAGNOSIS — K5900 Constipation, unspecified: Secondary | ICD-10-CM

## 2017-06-03 DIAGNOSIS — Z1211 Encounter for screening for malignant neoplasm of colon: Secondary | ICD-10-CM

## 2017-06-03 DIAGNOSIS — H9313 Tinnitus, bilateral: Secondary | ICD-10-CM

## 2017-06-03 NOTE — Progress Notes (Signed)
Patient: April Best Female    DOB: 09/11/1950   66 y.o.   MRN: [id number] Visit Date: 06/04/2017  Today's Provider: Trinna Post, PA-C   Chief Complaint  Patient presents with  . Tinnitus    Started a few months ago.  Bilateral seems to be worsening.  . Constipation   Subjective:    April Best is a 66 y/o woman with history of alcohol abuse, insomnia, depression/anxiety, and recent C. Diff infection presenting today for tinnitus, insomnia, and constipation.  She reports she has bilateral tinnitus and sensation of aural fullness. She reports this happened about 1-2 months ago when she was at [facility] for alcohol abuse rehabilitation. She reports right > left. Denies pulsatile quality. She is on SSRI, has been for >10 years. Also endorses some sinus pressure behind her eyes.  She also reports feeling tired in the day times. She has tried cutting her trazodone in half and does seem to be doing well with that. However, she says she wants her Lunesta back.  Additionally, she has been having some constipation. She had to cancel her previously scheduled colonoscopy as it coincided with her rehabilitation. She denies blood in her stool. She does endorse a two pound weight loss. She is not eating as much, she says it feels like a chore to eat. She denies and diarrhea. She denies any fevers, chills, nausea, vomiting.   Constipation  This is a new problem. The current episode started in the past 7 days. The problem has been gradually worsening since onset. There has not been adequate water intake. Associated symptoms include nausea. Pertinent negatives include no abdominal pain, diarrhea, fever, rectal pain or vomiting. She has tried nothing for the symptoms.  Ear Fullness   There is pain in both (Right worse than left; started a few months ago.) ears. This is a chronic problem. The problem has been gradually worsening. There has been no fever. Associated symptoms include  headaches. Pertinent negatives include no abdominal pain, coughing, diarrhea, ear discharge, hearing loss, neck pain, rash, rhinorrhea, sore throat or vomiting.       Allergies  Allergen Reactions  . Augmentin  [Amoxicillin-Pot Clavulanate] Nausea And Vomiting    as stated (XR).  Marland Kitchen Lisinopril Cough     Current Outpatient Medications:  .  amLODipine (NORVASC) 10 MG tablet, Take 1 tablet (10 mg total) daily by mouth., Disp: 90 tablet, Rfl: 0 .  clobetasol ointment (TEMOVATE) 0.05 %, APP TOPICALLY TO GLUTEAL AREA ONCE D PRF DERMATITIS, Disp: , Rfl: 1 .  escitalopram (LEXAPRO) 20 MG tablet, TAKE 1 TABLET(20 MG TOTAL) BY MOUTH EVERY DAY, Disp: 90 tablet, Rfl: 1 .  fluticasone (FLONASE) 50 MCG/ACT nasal spray, SHAKE LIQUID AND USE 2 SPRAYS IN EACH NOSTRIL EVERY DAY, Disp: 16 g, Rfl: 0 .  hydrochlorothiazide (HYDRODIURIL) 25 MG tablet, Take 1 tablet (25 mg total) daily by mouth., Disp: 90 tablet, Rfl: 0 .  loratadine (CLARITIN) 10 MG tablet, Take 10 mg by mouth daily., Disp: , Rfl:  .  nystatin (MYCOSTATIN) 100000 UNIT/ML suspension, Take 5 mLs (500,000 Units total) by mouth 4 (four) times daily., Disp: 60 mL, Rfl: 0 .  ondansetron (ZOFRAN) 4 MG tablet, Take 1 tablet (4 mg total) every 8 (eight) hours as needed by mouth for nausea or vomiting., Disp: 20 tablet, Rfl: 0 .  traZODone (DESYREL) 100 MG tablet, Take 1 tablet (100 mg total) at bedtime by mouth., Disp: 90 tablet, Rfl: 0  Review of Systems  Constitutional: Positive for fatigue. Negative for activity change, appetite change, chills, diaphoresis, fever and unexpected weight change.  HENT: Positive for sinus pressure and sinus pain. Negative for congestion, ear discharge, hearing loss, rhinorrhea and sore throat.   Respiratory: Negative.  Negative for cough.   Cardiovascular: Negative.   Gastrointestinal: Positive for constipation and nausea. Negative for abdominal distention, abdominal pain, anal bleeding, blood in stool, diarrhea, rectal  pain and vomiting.  Musculoskeletal: Negative for neck pain.  Skin: Negative for rash.  Neurological: Positive for headaches. Negative for dizziness and light-headedness.    Social History   Tobacco Use  . Smoking status: Current Every Day Smoker    Packs/day: 0.50    Years: 40.00    Pack years: 20.00    Types: Cigarettes  . Smokeless tobacco: Never Used  . Tobacco comment: pt declines smoking cessation  Substance Use Topics  . Alcohol use: Yes    Alcohol/week: 0.0 oz    Comment: Three glasses of per month   Objective:   BP 112/60 (BP Location: Left Arm, Patient Position: Sitting, Cuff Size: Normal)   Pulse 68   Temp 98.1 F (36.7 C) (Oral)   Resp 16   Wt 100 lb (45.4 kg)   BMI 18.89 kg/m  Vitals:   06/03/17 1541  BP: 112/60  Pulse: 68  Resp: 16  Temp: 98.1 F (36.7 C)  TempSrc: Oral  Weight: 100 lb (45.4 kg)     Physical Exam  Constitutional: She is oriented to person, place, and time. She appears well-developed and well-nourished.  HENT:  Right Ear: Tympanic membrane and external ear normal.  Left Ear: Tympanic membrane and external ear normal.  Cardiovascular: Normal rate.  Pulmonary/Chest: Effort normal.  Abdominal: Soft. Bowel sounds are normal. She exhibits no distension and no mass. There is no tenderness. There is no rebound and no guarding.  Neurological: She is alert and oriented to person, place, and time.  Skin: Skin is warm and dry.  Psychiatric: She has a normal mood and affect. Her behavior is normal.        Assessment & Plan:     1. Constipation, unspecified constipation type  She is taking a probiotic. Encouraged increased fiber, may do stool softener like docusate sodium. May advance to capful of miralax daily if this is not helpful. We will need to get her rescheduled for colonoscopy.  2. Colon cancer screening  - Ambulatory referral to Gastroenterology  3. Tinnitus of both ears  - Ambulatory referral to ENT  4. Insomnia,  unspecified type  I will not restart Lunesta. She may continue trazodone, either 50 mg of 100 mg, her choosing.  Return in about 6 months (around 12/02/2017) for BP, anxiety, depression.  The entirety of the information documented in the History of Present Illness, Review of Systems and Physical Exam were personally obtained by me. Portions of this information were initially documented by Ashley Royalty, CMA and reviewed by me for thoroughness and accuracy.         Trinna Post, PA-C  [facility]

## 2017-06-03 NOTE — Patient Instructions (Signed)
Docusate sodium - over the counter stool softener Miralax if not working, start with hal capful daily and can go up to capful daily  Constipation, Adult Constipation is when a person:  Poops (has a bowel movement) fewer times in a week than normal.  Has a hard time pooping.  Has poop that is dry, hard, or bigger than normal.  Follow these instructions at home: Eating and drinking   Eat foods that have a lot of fiber, such as: ? Fresh fruits and vegetables. ? Whole grains. ? Beans.  Eat less of foods that are high in fat, low in fiber, or overly processed, such as: ? [place] fries. ? Hamburgers. ? Cookies. ? Candy. ? Soda.  Drink enough fluid to keep your pee (urine) clear or pale yellow. General instructions  Exercise regularly or as told by your doctor.  Go to the restroom when you feel like you need to poop. Do not hold it in.  Take over-the-counter and prescription medicines only as told by your doctor. These include any fiber supplements.  Do pelvic floor retraining exercises, such as: ? Doing deep breathing while relaxing your lower belly (abdomen). ? Relaxing your pelvic floor while pooping.  Watch your condition for any changes.  Keep all follow-up visits as told by your doctor. This is important. Contact a doctor if:  You have pain that gets worse.  You have a fever.  You have not pooped for 4 days.  You throw up (vomit).  You are not hungry.  You lose weight.  You are bleeding from the anus.  You have thin, pencil-like poop (stool). Get help right away if:  You have a fever, and your symptoms suddenly get worse.  You leak poop or have blood in your poop.  Your belly feels hard or bigger than normal (is bloated).  You have very bad belly pain.  You feel dizzy or you faint. This information is not intended to replace advice given to you by your health care provider. Make sure you discuss any questions you have with your health care  provider. Document Released: 11/20/2007 Document Revised: 12/22/2015 Document Reviewed: 11/22/2015 Elsevier Interactive Patient Education  2017 [employer].

## 2017-06-25 ENCOUNTER — Telehealth: Payer: Self-pay | Admitting: Physician Assistant

## 2017-06-25 NOTE — Telephone Encounter (Signed)
Patient states that she is still having vomiting and diarrhea, and stomach cramps, she wants to pick you the kit to be tested for c-diff. Can someone please call her ° °CB# [phone] °

## 2017-06-25 NOTE — Telephone Encounter (Signed)
Left Message advising pt to make an appointment.   Thanks,   -Mickel Baas

## 2017-06-25 NOTE — Telephone Encounter (Signed)
Can somebody please triage this? She needs an appointment, I won't order this over the phone.

## 2017-06-26 ENCOUNTER — Ambulatory Visit: Payer: BLUE CROSS/BLUE SHIELD | Admitting: Physician Assistant

## 2017-06-26 ENCOUNTER — Encounter: Payer: Self-pay | Admitting: Physician Assistant

## 2017-06-26 VITALS — BP 120/80 | HR 61 | Temp 98.3°F | Resp 16 | Wt 104.8 lb

## 2017-06-26 DIAGNOSIS — R109 Unspecified abdominal pain: Secondary | ICD-10-CM | POA: Diagnosis not present

## 2017-06-26 DIAGNOSIS — Z8619 Personal history of other infectious and parasitic diseases: Secondary | ICD-10-CM

## 2017-06-26 DIAGNOSIS — R197 Diarrhea, unspecified: Secondary | ICD-10-CM

## 2017-06-26 DIAGNOSIS — R1111 Vomiting without nausea: Secondary | ICD-10-CM

## 2017-06-26 MED ORDER — ONDANSETRON HCL 4 MG PO TABS: 4.0000 mg | ORAL_TABLET | Freq: Three times a day (TID) | ORAL | 0 refills | Status: DC | PRN

## 2017-06-26 MED ORDER — DICYCLOMINE HCL 20 MG PO TABS: 20.0000 mg | ORAL_TABLET | Freq: Three times a day (TID) | ORAL | 0 refills | Status: DC

## 2017-06-26 NOTE — Patient Instructions (Signed)
Clostridium Difficile Infection   Clostridium difficile (C. difficile or C. diff) infection causes inflammation of the large intestine (colon). This condition can result in damage to the lining of your colon and may lead to another condition called colitis. This infection can be passed from person to person (is contagious).  Follow these instructions at home:  Eating and drinking   · Drink enough fluid to keep your pee (urine) clear or pale yellow.  · Avoid drinking:  ? Milk.  ? Caffeine.  ? Alcohol.  · Follow exact instructions from your doctor about how to get enough fluid in your body (rehydrate).  · Eat small meals often instead of large meals.  Medicines   · Take your antibiotic medicine as told by your doctor. Do not stop taking the antibiotic even if you start to feel better unless your doctor told you to do that.  · Take over-the-counter and prescription medicines only as told by your doctor.  · Do not use medicines to help with watery poop (diarrhea).  General instructions   · Wash your hands fully before you prepare food and after you use the bathroom. Make sure people who live with you also wash their  · hands often.  · Clean the surfaces that you touch. Use a product that contains chlorine bleach.  · Keep all follow-up visits as told by your doctor. This is important.  Contact a doctor if:  · Your symptoms do not get better with treatment.  · Your symptoms get worse with treatment.  · Your symptoms go away and then come back.  · You have a fever.  · You have new symptoms.  Get help right away if:  · You have more pain or tenderness in your belly (abdomen).  · Your poop (stool) is mostly bloody.  · Your poop looks dark black and tarry.  · You cannot eat or drink without throwing up (vomiting).  · You have signs of dehydration, such as:  ? Dark pee, very little pee, or no pee.  ? Cracked lips.  ? Not making tears when you cry.  ? Dry mouth.  ? Sunken eyes.  ? Feeling sleepy.  ? Feeling weak.  ? Feeling  dizzy.  This information is not intended to replace advice given to you by your health care provider. Make sure you discuss any questions you have with your health care provider.  Document Released: 03/31/2009 Document Revised: 11/09/2015 Document Reviewed: 12/05/2014  Elsevier Interactive Patient Education © 2017 Elsevier Inc.

## 2017-06-26 NOTE — Progress Notes (Addendum)
Patient: April Best Female    DOB: [birth year]   67 y.o.   MRN: [id number] Visit Date: 07/08/2017  Today's Provider: Trinna Post, PA-C   Chief Complaint  Patient presents with  . Abdominal Cramping   Subjective:    April Best is a 67 y/o woman presenting today for abdominal cramping, nausea, vomiting and diarrhea ongoing for three days. She reports that three days ago, she was nauseated and had one episode of vomiting. That day she had 2-3 episodes of diarrhea. The next day she did not have any diarrhea or vomiting, but the nausea still continued. She reports on the third day, she had one formed stool. She does have a history of recent c. Diff infection on 05/06/2017 that resolved with flagyl. Since then, she has not had anymore antibiotics, no hospitalizations or surgeries. However, she reports that she has not been the same since then. Reports continued abdominal pain, malodorous flatulence. Her bowel movements outside of this episode have stabilized with the use of stool softeners. She has a GI appt with Jefm Bryant in March.   Abdominal Cramping  This is a new problem. The current episode started in the past 7 days (started on Monday). The onset quality is gradual. The problem occurs intermittently. The problem has been gradually worsening. The pain is located in the generalized abdominal region. The pain is at a severity of 7/10. The pain is moderate. The quality of the pain is cramping. The abdominal pain radiates to the back. Associated symptoms include headaches and nausea. Pertinent negatives include no fever. Diarrhea: diarrhea once on Monday. Bad odor. Vomiting: on Monday only. Associated symptoms comments: "General body ache". Nothing aggravates the pain. The pain is relieved by nothing. She has tried nothing (Patient was pos for C.Diff and was treated with Flagyl a 1-2 month ago.) for the symptoms.       Allergies  Allergen Reactions  . Augmentin   [Amoxicillin-Pot Clavulanate] Nausea And Vomiting    as stated (XR).  Marland Kitchen Lisinopril Cough     Current Outpatient Medications:  .  amLODipine (NORVASC) 10 MG tablet, Take 1 tablet (10 mg total) daily by mouth., Disp: 90 tablet, Rfl: 0 .  clobetasol ointment (TEMOVATE) 0.05 %, APP TOPICALLY TO GLUTEAL AREA ONCE D PRF DERMATITIS, Disp: , Rfl: 1 .  fluticasone (FLONASE) 50 MCG/ACT nasal spray, SHAKE LIQUID AND USE 2 SPRAYS IN EACH NOSTRIL EVERY DAY, Disp: 16 g, Rfl: 0 .  hydrochlorothiazide (HYDRODIURIL) 25 MG tablet, Take 1 tablet (25 mg total) daily by mouth., Disp: 90 tablet, Rfl: 0 .  loratadine (CLARITIN) 10 MG tablet, Take 10 mg by mouth daily., Disp: , Rfl:  .  traZODone (DESYREL) 100 MG tablet, Take 1 tablet (100 mg total) at bedtime by mouth., Disp: 90 tablet, Rfl: 0 .  dicyclomine (BENTYL) 20 MG tablet, Take 1 tablet (20 mg total) by mouth 3 (three) times daily before meals., Disp: 90 tablet, Rfl: 0 .  escitalopram (LEXAPRO) 20 MG tablet, TAKE 1 TABLET(20 MG TOTAL) BY MOUTH EVERY DAY, Disp: 90 tablet, Rfl: 0 .  metroNIDAZOLE (FLAGYL) 500 MG tablet, Take 1 tablet (500 mg total) by mouth 3 (three) times daily for 10 days., Disp: 30 tablet, Rfl: 0 .  nystatin (MYCOSTATIN) 100000 UNIT/ML suspension, Take 5 mLs (500,000 Units total) by mouth 4 (four) times daily. (Patient not taking: Reported on 06/26/2017), Disp: 60 mL, Rfl: 0 .  ondansetron (ZOFRAN) 4 MG tablet, Take 1 tablet (  4 mg total) by mouth every 8 (eight) hours as needed for nausea or vomiting., Disp: 20 tablet, Rfl: 0  Review of Systems  Constitutional: Negative for fever.  Cardiovascular: Negative for chest pain, palpitations and leg swelling.  Gastrointestinal: Positive for nausea. Diarrhea: diarrhea once on Monday. Bad odor. Vomiting: on Monday only.  Neurological: Positive for headaches.    Social History   Tobacco Use  . Smoking status: Current Every Day Smoker    Packs/day: 0.50    Years: 40.00    Pack years: 20.00     Types: Cigarettes  . Smokeless tobacco: Never Used  . Tobacco comment: pt declines smoking cessation  Substance Use Topics  . Alcohol use: Yes    Alcohol/week: 0.0 oz    Comment: Three glasses of per month   Objective:   BP 120/80 (BP Location: Left Arm, Patient Position: Sitting, Cuff Size: Normal)   Pulse 61   Temp 98.3 F (36.8 C) (Oral)   Resp 16   Wt 104 lb 12.8 oz (47.5 kg)   SpO2 98%   BMI 19.80 kg/m  16  Physical Exam  Constitutional: She is oriented to person, place, and time. She appears well-developed and well-nourished.  Cardiovascular: Normal rate.  Pulmonary/Chest: Effort normal.  Abdominal: Soft. She exhibits no distension. Bowel sounds are increased. There is no hepatomegaly. There is no tenderness. There is no rebound.  Neurological: She is alert and oriented to person, place, and time.  Skin: Skin is warm and dry.  Psychiatric: She has a normal mood and affect. Her behavior is normal.        Assessment & Plan:     1. Vomiting without nausea, intractability of vomiting not specified, unspecified vomiting type  - ondansetron (ZOFRAN) 4 MG tablet; Take 1 tablet (4 mg total) by mouth every 8 (eight) hours as needed for nausea or vomiting.  Dispense: 20 tablet; Refill: 0  2. Diarrhea, unspecified type  Do think this represents more of an acute gastroenteritis rather than a recurrence of C. Diff, but I do think it is reasonable to recheck this. Have counseled pt that if stool is formed, lab will not perform test as there is such a low likelihood of it being positive for C. Diff, but she may provide sample. Will also try bentyl for abdominal pain, possible intestinal spasms. Have counseled on return precautions.   - dicyclomine (BENTYL) 20 MG tablet; Take 1 tablet (20 mg total) by mouth 3 (three) times daily before meals.  Dispense: 90 tablet; Refill: 0 - C difficile Toxins A+B W/Rflx  3. History of Clostridium difficile colitis  C. Diff episode on 11/20/208,  has had one prior to this as well.    4. Abdominal cramping  Worsening. Will prescribe Bentyl as below. She is going to get CMET with oncology in a week's time, will follow this.   - dicyclomine (BENTYL) 20 MG tablet; Take 1 tablet (20 mg total) by mouth 3 (three) times daily before meals.  Dispense: 90 tablet; Refill: 0  I have spent 25 minutes with this patient, >50% of which was spent on counseling and coordination of care.      Trinna Post, PA-C  [facility]

## 2017-07-01 ENCOUNTER — Telehealth: Payer: Self-pay

## 2017-07-01 ENCOUNTER — Telehealth: Payer: Self-pay | Admitting: Physician Assistant

## 2017-07-01 DIAGNOSIS — R197 Diarrhea, unspecified: Secondary | ICD-10-CM

## 2017-07-01 DIAGNOSIS — Z8619 Personal history of other infectious and parasitic diseases: Secondary | ICD-10-CM

## 2017-07-01 MED ORDER — METRONIDAZOLE 500 MG PO TABS: 500.0000 mg | ORAL_TABLET | Freq: Three times a day (TID) | ORAL | 0 refills | Status: DC

## 2017-07-01 NOTE — Telephone Encounter (Signed)
Pt stated she was returning Sarah's call. Thanks TNP °

## 2017-07-01 NOTE — Telephone Encounter (Signed)
Pt advised.  She reports still not feeling well.  Trouble with nausea (No vomiting), loose bowels, stomach cramping.    Thanks,   -Mickel Baas

## 2017-07-01 NOTE — Telephone Encounter (Signed)
LMTCB 07/01/2017  Thanks,   -Dawnmarie Breon  

## 2017-07-01 NOTE — Telephone Encounter (Signed)
Pt advised.  She would like to try another round of Flagyl please send to [facility].    Also she stated she wanted to try and get to [facility] sooner if possible.   Thanks,   -Mickel Baas

## 2017-07-01 NOTE — Addendum Note (Signed)
Addended by: Trinna Post on: 07/01/2017 04:04 PM   Modules accepted: Orders

## 2017-07-01 NOTE — Telephone Encounter (Signed)
I can send in another round of flagyl to treat her empirically, even though the test is negative right now. How does she feel about this? Also, did she prefer [facility]? I can also try to re-refer her to [facility] and see if she can get a sooner appointment as her GI appointment is in March.

## 2017-07-01 NOTE — Telephone Encounter (Signed)
I have sent her flagyl 300 mg TID for 10 days. Would like her to call us back Thursday and let us know how she was doing.  I have spoken to Parke Poisson and we are trying to get her GI appt moved up.

## 2017-07-01 NOTE — Telephone Encounter (Signed)
-----   Message from Trinna Post, [place] sent at 06/30/2017  4:03 PM EST ----- C diff lab results preliminarily negative. Looks like they are doing follow up for one of the toxins. How is she feeling?

## 2017-07-01 NOTE — Telephone Encounter (Signed)
Patient is returning your call and requesting a call back. °

## 2017-07-02 ENCOUNTER — Other Ambulatory Visit
Admission: RE | Admit: 2017-07-02 | Discharge: 2017-07-02 | Disposition: A | Discharge status: Home / Self Care | Payer: BLUE CROSS/BLUE SHIELD | Source: Ambulatory Visit | Attending: Gastroenterology | Admitting: Gastroenterology

## 2017-07-02 DIAGNOSIS — R197 Diarrhea, unspecified: Secondary | ICD-10-CM | POA: Insufficient documentation

## 2017-07-02 DIAGNOSIS — Z8619 Personal history of other infectious and parasitic diseases: Secondary | ICD-10-CM | POA: Insufficient documentation

## 2017-07-02 DIAGNOSIS — R1030 Lower abdominal pain, unspecified: Secondary | ICD-10-CM | POA: Insufficient documentation

## 2017-07-02 LAB — GASTROINTESTINAL PANEL BY PCR, STOOL (REPLACES STOOL CULTURE)
Adenovirus F40/41: NOT DETECTED
Astrovirus: NOT DETECTED
Campylobacter species: NOT DETECTED
Cryptosporidium: NOT DETECTED
Cyclospora cayetanensis: NOT DETECTED
ENTEROAGGREGATIVE E COLI (EAEC): NOT DETECTED
ENTEROTOXIGENIC E COLI (ETEC): NOT DETECTED
Entamoeba histolytica: NOT DETECTED
Enteropathogenic E coli (EPEC): NOT DETECTED
Giardia lamblia: NOT DETECTED
NOROVIRUS GI/GII: DETECTED — AB
Plesimonas shigelloides: NOT DETECTED
Rotavirus A: NOT DETECTED
SALMONELLA SPECIES: NOT DETECTED
SAPOVIRUS (I, II, IV, AND V): NOT DETECTED
SHIGA LIKE TOXIN PRODUCING E COLI (STEC): NOT DETECTED
SHIGELLA/ENTEROINVASIVE E COLI (EIEC): NOT DETECTED
Vibrio cholerae: NOT DETECTED
Vibrio species: NOT DETECTED
Yersinia enterocolitica: NOT DETECTED

## 2017-07-02 LAB — C DIFFICILE QUICK SCREEN W PCR REFLEX
C DIFFICILE (CDIFF) TOXIN: NEGATIVE
C Diff antigen: NEGATIVE
C Diff interpretation: NOT DETECTED

## 2017-07-04 ENCOUNTER — Other Ambulatory Visit: Payer: Self-pay | Admitting: Family Medicine

## 2017-07-04 ENCOUNTER — Inpatient Hospital Stay: Payer: BLUE CROSS/BLUE SHIELD | Attending: Internal Medicine

## 2017-07-04 DIAGNOSIS — F410 Panic disorder [episodic paroxysmal anxiety] without agoraphobia: Secondary | ICD-10-CM

## 2017-07-04 LAB — CBC WITH DIFFERENTIAL/PLATELET
Basophils Absolute: 0.1 10*3/uL (ref 0–0.1)
Basophils Relative: 1 %
EOS PCT: 3 %
Eosinophils Absolute: 0.2 10*3/uL (ref 0–0.7)
HEMATOCRIT: 43.1 % (ref 35.0–47.0)
Hemoglobin: 14.7 g/dL (ref 12.0–16.0)
LYMPHS ABS: 1.9 10*3/uL (ref 1.0–3.6)
LYMPHS PCT: 29 %
MCH: 33.1 pg (ref 26.0–34.0)
MCHC: 34.1 g/dL (ref 32.0–36.0)
MCV: 97.1 fL (ref 80.0–100.0)
MONO ABS: 0.4 10*3/uL (ref 0.2–0.9)
Monocytes Relative: 6 %
Neutro Abs: 3.9 10*3/uL (ref 1.4–6.5)
Neutrophils Relative %: 61 %
PLATELETS: 374 10*3/uL (ref 150–440)
RBC: 4.43 MIL/uL (ref 3.80–5.20)
RDW: 12.5 % (ref 11.5–14.5)
WBC: 6.4 10*3/uL (ref 3.6–11.0)

## 2017-07-04 LAB — IRON AND TIBC
IRON: 120 ug/dL (ref 28–170)
Saturation Ratios: 40 % — ABNORMAL HIGH (ref 10.4–31.8)
TIBC: 302 ug/dL (ref 250–450)
UIBC: 182 ug/dL

## 2017-07-04 LAB — FERRITIN: FERRITIN: 63 ng/mL (ref 11–307)

## 2017-07-04 LAB — C DIFFICILE TOXINS A+B W/RFLX: C difficile Toxins A+B, EIA: NEGATIVE

## 2017-07-04 LAB — C DIFFICILE, CYTOTOXIN B

## 2017-07-04 NOTE — Progress Notes (Signed)
Advised 

## 2017-07-11 ENCOUNTER — Inpatient Hospital Stay (HOSPITAL_BASED_OUTPATIENT_CLINIC_OR_DEPARTMENT_OTHER): Payer: BLUE CROSS/BLUE SHIELD | Admitting: Internal Medicine

## 2017-07-11 ENCOUNTER — Other Ambulatory Visit: Payer: Self-pay | Admitting: Physician Assistant

## 2017-07-11 ENCOUNTER — Inpatient Hospital Stay: Payer: BLUE CROSS/BLUE SHIELD

## 2017-07-11 ENCOUNTER — Other Ambulatory Visit: Payer: BLUE CROSS/BLUE SHIELD

## 2017-07-11 DIAGNOSIS — I10 Essential (primary) hypertension: Secondary | ICD-10-CM

## 2017-07-11 DIAGNOSIS — G4700 Insomnia, unspecified: Secondary | ICD-10-CM

## 2017-07-11 NOTE — Progress Notes (Signed)
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  She has been in a rehab.   States that she was recently diagnosed with C. Difficile/followed by [place] virus infection.   Denies any nausea vomiting. Denies any swelling in legs. No weight loss or weight gain. Denies any fatigue. Denies any joint pains.  REVIEW OF SYSTEMS:  A complete 10 point review of system is done which is negative except mentioned above/history of present illness.   PAST MEDICAL HISTORY :  Past Medical History:  Diagnosis Date  . Allergy   . Anxiety   . Depression   . Guaiac positive stools    history  . H. pylori infection   . 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 History of palpitations   . Hyperlipidemia   . Hypertension   . Migraine   . Osteoporosis   . Panic disorder     PAST SURGICAL HISTORY :   Past Surgical History:  Procedure Laterality Date  . ABDOMINAL HYSTERECTOMY  1983   Ovaries have been removed.  . ABDOMINOPLASTY    . APPENDECTOMY    . BREAST BIOPSY Right    neg- core  . COLONOSCOPY  03/2006  . RHINOPLASTY  2002  . UPPER GI ENDOSCOPY  2008    FAMILY HISTORY :   Family History  Problem Relation Age of Onset  . Hypertension Mother   . Hyperlipidemia Mother   . Hypothyroidism Mother   . Fibromyalgia Mother   . Lung cancer Father   . Coronary artery disease Father   . Hyperlipidemia Father   . Hypertension Father   . Heart attack Father   . Hyperlipidemia Sister   . Hypertension Sister   .  Fibromyalgia Sister   . ADD / ADHD Brother   . Hemochromatosis Sister   . Breast cancer Neg Hx     SOCIAL HISTORY:   Social History   Tobacco Use  . Smoking status: Current Every Day Smoker    Packs/day: 0.50    Years: 40.00    Pack years: 20.00    Types: Cigarettes  . Smokeless tobacco: Never Used  . Tobacco comment: pt declines smoking cessation  Substance Use Topics  . Alcohol use: Yes    Alcohol/week: 0.0 oz    Comment: Three glasses of per month  . Drug use: No    ALLERGIES:  is allergic to augmentin  [amoxicillin-pot clavulanate] and lisinopril.  MEDICATIONS:  Current Outpatient Medications  Medication Sig Dispense Refill  . fluticasone (FLONASE) 50 MCG/ACT nasal spray SHAKE LIQUID AND USE 2 SPRAYS IN EACH NOSTRIL EVERY DAY 16 g 0  . amLODipine (NORVASC) 10 MG tablet TAKE 1 TABLET BY MOUTH DAILY GENERIC EQUIVALENT FOR NORVASC 90 tablet 0  . clobetasol ointment (TEMOVATE) 0.05 % APP TOPICALLY TO GLUTEAL AREA ONCE D PRF DERMATITIS  1  . dicyclomine (BENTYL) 20 MG tablet Take 1 tablet (20 mg total) by mouth 3 (three) times daily before meals. (Patient not taking: Reported on 07/11/2017) 90 tablet 0  . escitalopram (LEXAPRO) 20 MG tablet TAKE 1 TABLET(20 MG TOTAL) BY  MOUTH EVERY DAY (Patient not taking: Reported on 07/11/2017) 90 tablet 0  . hydrochlorothiazide (HYDRODIURIL) 25 MG tablet TAKE 1 TABLET BY MOUTH DAILY 90 tablet 0  . loratadine (CLARITIN) 10 MG tablet Take 10 mg by mouth daily.    . ondansetron (ZOFRAN) 4 MG tablet Take 1 tablet (4 mg total) by mouth every 8 (eight) hours as needed for nausea or vomiting. (Patient not taking: Reported on 07/11/2017) 20 tablet 0  . traZODone (DESYREL) 100 MG tablet TAKE 1 TABLET BY MOUTH AT BEDTIME GENERIC EQUIVALENT FOR DESYREL 90 tablet 0   No current facility-administered medications for this visit.     PHYSICAL EXAMINATION: ECOG PERFORMANCE STATUS: 0 - Asymptomatic  BP 113/75 (BP Location: Left Arm, Patient Position:  Sitting)   Pulse 64   Temp 98.4 F (36.9 C) (Tympanic)   Resp 16   Wt 104 lb 6.4 oz (47.4 kg)   BMI 19.73 kg/m   Filed Weights   07/11/17 1140  Weight: 104 lb 6.4 oz (47.4 kg)    GENERAL: Well-nourished well-developed; Alert, no distress and comfortable.   She is alone. EYES: no pallor or icterus OROPHARYNX: no thrush or ulceration; good dentition  NECK: supple, no masses felt LYMPH:  no palpable lymphadenopathy in the cervical, axillary or inguinal regions LUNGS: clear to auscultation and  No wheeze or crackles HEART/CVS: regular rate & rhythm and no murmurs; No lower extremity edema ABDOMEN:abdomen soft, non-tender and normal bowel sounds Musculoskeletal:no cyanosis of digits and no clubbing  PSYCH: alert & oriented x 3 with fluent speech NEURO: no focal motor/sensory deficits SKIN:  no rashes or significant lesions  LABORATORY DATA:  I have reviewed the data as listed    Component Value Date/Time   NA 138 05/05/2017 1653   NA 140 01/04/2015 1142   K 3.5 05/05/2017 1653   CL 97 (L) 05/05/2017 1653   CO2 34 (H) 05/05/2017 1653   GLUCOSE 115 (H) 05/05/2017 1653   BUN 14 05/05/2017 1653   BUN 17 01/04/2015 1142   CREATININE 0.57 05/05/2017 1653   CALCIUM 10.0 05/05/2017 1653   PROT 6.4 05/05/2017 1653   PROT 7.1 01/04/2015 1142   ALBUMIN 4.3 10/09/2016 1058   ALBUMIN 5.1 (H) 01/04/2015 1142   AST 18 05/05/2017 1653   ALT 28 05/05/2017 1653   ALKPHOS 91 10/09/2016 1058   BILITOT 0.2 05/05/2017 1653   BILITOT 0.3 01/04/2015 1142   GFRNONAA 97 05/05/2017 1653   GFRAA 113 05/05/2017 1653    No results found for: SPEP, UPEP  Lab Results  Component Value Date   WBC 6.4 07/04/2017   NEUTROABS 3.9 07/04/2017   HGB 14.7 07/04/2017   HCT 43.1 07/04/2017   MCV 97.1 07/04/2017   PLT 374 07/04/2017      Chemistry      Component Value Date/Time   NA 138 05/05/2017 1653   NA 140 01/04/2015 1142   K 3.5 05/05/2017 1653   CL 97 (L) 05/05/2017 1653   CO2 34 (H)  05/05/2017 1653   BUN 14 05/05/2017 1653   BUN 17 01/04/2015 1142   CREATININE 0.57 05/05/2017 1653   GLU 102 09/05/2014      Component Value Date/Time   CALCIUM 10.0 05/05/2017 1653   ALKPHOS 91 10/09/2016 1058   AST 18 05/05/2017 1653   ALT 28 05/05/2017 1653   BILITOT 0.2 05/05/2017 1653   BILITOT 0.3 01/04/2015 1142       ASSESSMENT & PLAN:   [name] (  [place]) # HEREDITARY  Hemochromatosis/ 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 Recommend phlebotomy if greater ferritin than 150 or iron saturation greater than 50% [which is higher].  Hold any phlebotomy at this time.  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  Congratulated the patient on alcohol abstinence.  #C. difficile/norvo virus infection improved.   Follow-up in 6 months with CBC/ iron studies-ferritin- 1 week prior; possible phlebotomy.       Cammie Sickle, MD 07/15/2017 8:30 AM

## 2017-07-11 NOTE — Assessment & Plan Note (Addendum)
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 Recommend phlebotomy if greater ferritin than 150 or iron saturation greater than 50% [which is higher].  Hold any phlebotomy at this time.  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  Congratulated the patient on alcohol abstinence.  #C. difficile/norvo virus infection improved.   Follow-up in 6 months with CBC/ iron studies-ferritin- 1 week prior; possible phlebotomy.

## 2017-07-13 ENCOUNTER — Other Ambulatory Visit: Payer: Self-pay | Admitting: Physician Assistant

## 2017-07-13 DIAGNOSIS — I10 Essential (primary) hypertension: Secondary | ICD-10-CM

## 2017-07-13 DIAGNOSIS — G4700 Insomnia, unspecified: Secondary | ICD-10-CM

## 2017-07-14 ENCOUNTER — Other Ambulatory Visit: Payer: Self-pay | Admitting: Physician Assistant

## 2017-07-14 DIAGNOSIS — I10 Essential (primary) hypertension: Secondary | ICD-10-CM

## 2017-07-14 DIAGNOSIS — G4700 Insomnia, unspecified: Secondary | ICD-10-CM

## 2017-10-01 DIAGNOSIS — R197 Diarrhea, unspecified: Secondary | ICD-10-CM | POA: Diagnosis not present

## 2017-10-01 DIAGNOSIS — Z1211 Encounter for screening for malignant neoplasm of colon: Secondary | ICD-10-CM | POA: Diagnosis not present

## 2017-10-06 ENCOUNTER — Other Ambulatory Visit: Payer: Self-pay | Admitting: Physician Assistant

## 2017-10-06 DIAGNOSIS — F410 Panic disorder [episodic paroxysmal anxiety] without agoraphobia: Secondary | ICD-10-CM

## 2017-10-06 DIAGNOSIS — I10 Essential (primary) hypertension: Secondary | ICD-10-CM

## 2017-10-06 DIAGNOSIS — G4700 Insomnia, unspecified: Secondary | ICD-10-CM

## 2017-10-07 NOTE — Telephone Encounter (Signed)
Pharmacy requesting refills. Thanks!  

## 2017-11-18 ENCOUNTER — Encounter: Payer: Self-pay | Admitting: *Deleted

## 2017-11-19 ENCOUNTER — Ambulatory Visit
Admission: RE | Admit: 2017-11-19 | Discharge: 2017-11-19 | Disposition: A | Discharge status: Home / Self Care | Payer: PPO | Source: Ambulatory Visit | Attending: Internal Medicine | Admitting: Internal Medicine

## 2017-11-19 ENCOUNTER — Encounter
Admission: RE | Disposition: A | Discharge status: Home / Self Care | Payer: Self-pay | Source: Ambulatory Visit | Attending: Internal Medicine

## 2017-11-19 ENCOUNTER — Encounter: Payer: Self-pay | Admitting: *Deleted

## 2017-11-19 ENCOUNTER — Ambulatory Visit: Payer: PPO | Admitting: Anesthesiology

## 2017-11-19 ENCOUNTER — Other Ambulatory Visit: Payer: Self-pay

## 2017-11-19 DIAGNOSIS — Z79899 Other long term (current) drug therapy: Secondary | ICD-10-CM | POA: Insufficient documentation

## 2017-11-19 DIAGNOSIS — K5730 Diverticulosis of large intestine without perforation or abscess without bleeding: Secondary | ICD-10-CM | POA: Insufficient documentation

## 2017-11-19 DIAGNOSIS — Z7951 Long term (current) use of inhaled steroids: Secondary | ICD-10-CM | POA: Insufficient documentation

## 2017-11-19 DIAGNOSIS — Z888 Allergy status to other drugs, medicaments and biological substances status: Secondary | ICD-10-CM | POA: Insufficient documentation

## 2017-11-19 DIAGNOSIS — F329 Major depressive disorder, single episode, unspecified: Secondary | ICD-10-CM | POA: Insufficient documentation

## 2017-11-19 DIAGNOSIS — K621 Rectal polyp: Secondary | ICD-10-CM | POA: Insufficient documentation

## 2017-11-19 DIAGNOSIS — D128 Benign neoplasm of rectum: Secondary | ICD-10-CM | POA: Diagnosis not present

## 2017-11-19 DIAGNOSIS — K648 Other hemorrhoids: Secondary | ICD-10-CM | POA: Diagnosis not present

## 2017-11-19 DIAGNOSIS — Z881 Allergy status to other antibiotic agents status: Secondary | ICD-10-CM | POA: Diagnosis not present

## 2017-11-19 DIAGNOSIS — F419 Anxiety disorder, unspecified: Secondary | ICD-10-CM | POA: Diagnosis not present

## 2017-11-19 DIAGNOSIS — Z8619 Personal history of other infectious and parasitic diseases: Secondary | ICD-10-CM | POA: Diagnosis not present

## 2017-11-19 DIAGNOSIS — K640 First degree hemorrhoids: Secondary | ICD-10-CM | POA: Insufficient documentation

## 2017-11-19 DIAGNOSIS — M810 Age-related osteoporosis without current pathological fracture: Secondary | ICD-10-CM | POA: Insufficient documentation

## 2017-11-19 DIAGNOSIS — F17200 Nicotine dependence, unspecified, uncomplicated: Secondary | ICD-10-CM | POA: Diagnosis not present

## 2017-11-19 DIAGNOSIS — D125 Benign neoplasm of sigmoid colon: Secondary | ICD-10-CM | POA: Diagnosis not present

## 2017-11-19 DIAGNOSIS — L409 Psoriasis, unspecified: Secondary | ICD-10-CM | POA: Diagnosis not present

## 2017-11-19 DIAGNOSIS — Z1211 Encounter for screening for malignant neoplasm of colon: Secondary | ICD-10-CM | POA: Insufficient documentation

## 2017-11-19 DIAGNOSIS — K635 Polyp of colon: Secondary | ICD-10-CM | POA: Diagnosis not present

## 2017-11-19 DIAGNOSIS — I10 Essential (primary) hypertension: Secondary | ICD-10-CM | POA: Insufficient documentation

## 2017-11-19 HISTORY — DX: Psoriasis, unspecified: L40.9

## 2017-11-19 HISTORY — DX: Other lichen planus: L43.8

## 2017-11-19 HISTORY — PX: COLONOSCOPY WITH PROPOFOL: SHX5780

## 2017-11-19 SURGERY — COLONOSCOPY WITH PROPOFOL
Anesthesia: General

## 2017-11-19 MED ORDER — SODIUM CHLORIDE 0.9 % IV SOLN
INTRAVENOUS | Status: DC
  Administered 2017-11-19: 12:00:00 via INTRAVENOUS

## 2017-11-19 MED ORDER — PROPOFOL 10 MG/ML IV BOLUS
INTRAVENOUS | Status: DC | PRN
  Administered 2017-11-19: 50 mg via INTRAVENOUS

## 2017-11-19 MED ORDER — PROPOFOL 500 MG/50ML IV EMUL
INTRAVENOUS | Status: AC
Start: 2017-11-19 — End: ?
  Filled 2017-11-19: qty 50

## 2017-11-19 MED ORDER — PROPOFOL 500 MG/50ML IV EMUL
INTRAVENOUS | Status: DC | PRN
  Administered 2017-11-19: 80 ug/kg/min via INTRAVENOUS

## 2017-11-19 NOTE — Anesthesia Post-op Follow-up Note (Signed)
Anesthesia QCDR form completed.        

## 2017-11-19 NOTE — H&P (Signed)
Outpatient short stay form Pre-procedure 11/19/2017 11:56 AM April Best April Best, M.D.  Primary Physician: April Best, M.D.  Reason for visit: Colon cancer screening.  History of present illness:  Patient presents for colon cancer screening. The patient denies abdominal pain, abnormal weight loss or rectal bleeding.    No current facility-administered medications for this encounter.   Medications Prior to Admission  Medication Sig Dispense Refill Last Dose  . calcipotriene (DOVONOX) 0.005 % ointment Apply topically 2 (two) times daily.     . polyethylene glycol powder (GLYCOLAX/MIRALAX) powder Take 1 Container by mouth once.     Marland Kitchen amLODipine (NORVASC) 10 MG tablet TAKE 1 TABLET BY MOUTH DAILY GENERIC EQUIVALENT FOR NORVASC 90 tablet 0   . clobetasol ointment (TEMOVATE) 0.05 % APP TOPICALLY TO GLUTEAL AREA ONCE D PRF DERMATITIS  1 Not Taking  . dicyclomine (BENTYL) 20 MG tablet Take 1 tablet (20 mg total) by mouth 3 (three) times daily before meals. (Patient not taking: Reported on 07/11/2017) 90 tablet 0 Not Taking  . escitalopram (LEXAPRO) 20 MG tablet TAKE 1 TABLET BY MOUTH EVERY DAY GENERIC EQUIVALENT FOR LEXAPRO 90 tablet 0   . fluticasone (FLONASE) 50 MCG/ACT nasal spray SHAKE LIQUID AND USE 2 SPRAYS IN EACH NOSTRIL EVERY DAY 16 g 0 Taking  . hydrochlorothiazide (HYDRODIURIL) 25 MG tablet TAKE 1 TABLET BY MOUTH DAILY 90 tablet 0   . loratadine (CLARITIN) 10 MG tablet Take 10 mg by mouth daily.   Not Taking  . ondansetron (ZOFRAN) 4 MG tablet Take 1 tablet (4 mg total) by mouth every 8 (eight) hours as needed for nausea or vomiting. (Patient not taking: Reported on 07/11/2017) 20 tablet 0 Not Taking  . traZODone (DESYREL) 100 MG tablet TAKE 1 TABLET BY MOUTH AT BEDTIME GENERIC EQUIVALENT FOR DESYREL 90 tablet 0      Allergies  Allergen Reactions  . Augmentin  [Amoxicillin-Pot Clavulanate] Nausea And Vomiting    as stated (XR).  Marland Kitchen Lisinopril Cough     Past Medical History:   Diagnosis Date  . Allergy   . Annular oral lichen planus   . Anxiety   . Depression   . Guaiac positive stools    history  . H. pylori infection   . 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 History of palpitations   . Hyperlipidemia   . Hypertension   . Migraine   . Osteoporosis   . Panic disorder   . Psoriasis     Review of systems:  Otherwise negative.    Physical Exam  Gen: Alert, oriented. Appears stated age.  HEENT: [place]/AT. PERRLA. Lungs: CTA, no wheezes. CV: RR nl S1, S2. Abd: soft, benign, no masses. BS+ Ext: No edema. Pulses 2+    Planned procedures: Proceed with colonoscopy. The patient understands the nature of the planned procedure, indications, risks, alternatives and potential complications including but not limited to bleeding, infection, perforation, damage to internal organs and possible oversedation/side effects from anesthesia. The patient agrees and gives consent to proceed.  Please refer to procedure notes for findings, recommendations and patient disposition/instructions.     April Best April Best, M.D. Gastroenterology 11/19/2017  11:56 AM

## 2017-11-19 NOTE — Anesthesia Preprocedure Evaluation (Signed)
Anesthesia Evaluation  Patient identified by MRN, date of birth, ID band Patient awake    Reviewed: Allergy & Precautions, H&P , NPO status , Patient's Chart, lab work & pertinent test results, reviewed documented beta blocker date and time   History of Anesthesia Complications Negative for: history of anesthetic complications  Airway Mallampati: II  TM Distance: >3 FB Neck ROM: full    Dental  (+) Dental Advidsory Given, Caps, Teeth Intact   Pulmonary neg shortness of breath, neg sleep apnea, neg COPD, Recent URI , Residual Cough, Current Smoker,           Cardiovascular Exercise Tolerance: Good hypertension, (-) angina(-) CAD, (-) Past MI, (-) Cardiac Stents and (-) CABG (-) dysrhythmias (-) Valvular Problems/Murmurs     Neuro/Psych negative neurological ROS  negative psych ROS   GI/Hepatic PUD (many years ago), neg GERD  ,Hemachromatosis   Endo/Other  negative endocrine ROS  Renal/GU negative Renal ROS  negative genitourinary   Musculoskeletal   Abdominal   Peds  Hematology negative hematology ROS (+)   Anesthesia Other Findings Past Medical History: No date: Allergy No date: Annular oral lichen planus No date: Anxiety No date: Depression No date: Guaiac positive stools     Comment:  history No date: H. pylori infection 12/14/2014: Hemochromatosis No date: History of palpitations No date: Hyperlipidemia No date: Hypertension No date: Migraine No date: Osteoporosis No date: Panic disorder No date: Psoriasis   Reproductive/Obstetrics negative OB ROS                             Anesthesia Physical Anesthesia Plan  ASA: II  Anesthesia Plan: General   Post-op Pain Management:    Induction: Intravenous  PONV Risk Score and Plan: 2 and Propofol infusion  Airway Management Planned: Nasal Cannula  Additional Equipment:   Intra-op Plan:   Post-operative Plan:    Informed Consent: I have reviewed the patients History and Physical, chart, labs and discussed the procedure including the risks, benefits and alternatives for the proposed anesthesia with the patient or authorized representative who has indicated his/her understanding and acceptance.   Dental Advisory Given  Plan Discussed with: Anesthesiologist, CRNA and Surgeon  Anesthesia Plan Comments:         Anesthesia Quick Evaluation

## 2017-11-19 NOTE — Interval H&P Note (Signed)
History and Physical Interval Note:  11/19/2017 11:57 AM  April Best  has presented today for surgery, with the diagnosis of SCREENING  The various methods of treatment have been discussed with the patient and family. After consideration of risks, benefits and other options for treatment, the patient has consented to  Procedure(s): COLONOSCOPY WITH PROPOFOL (N/A) as a surgical intervention .  The patient's history has been reviewed, patient examined, no change in status, stable for surgery.  I have reviewed the patient's chart and labs.  Questions were answered to the patient's satisfaction.     [place], [place]

## 2017-11-19 NOTE — Transfer of Care (Signed)
Immediate Anesthesia Transfer of Care Note  Patient: April Best  Procedure(s) Performed: COLONOSCOPY WITH PROPOFOL (N/A )  Patient Location: PACU  Anesthesia Type:General  Level of Consciousness: awake  Airway & Oxygen Therapy: Patient Spontanous Breathing  Post-op Assessment: Report given to RN  Post vital signs: stable  Last Vitals:  Vitals Value Taken Time  BP 110/59 11/19/2017  1:23 PM  Temp 36.2 C 11/19/2017  1:22 PM  Pulse 55 11/19/2017  1:23 PM  Resp 15 11/19/2017  1:23 PM  SpO2 99 % 11/19/2017  1:23 PM    Last Pain:  Vitals:   11/19/17 1322  TempSrc: Tympanic  PainSc: 0-No pain         Complications: No apparent anesthesia complications

## 2017-11-19 NOTE — Op Note (Signed)
[facility] Patient Name: April Best Procedure Date: 11/19/2017 12:53 PM MRN: [id number] Account #: [id number] Date of Birth: 05/30/1951 Admit Type: Outpatient Age: 67 Room: [facility] ENDO ROOM 3 Gender: Female Note Status: Finalized Procedure:            Colonoscopy Indications:          Screening for colorectal malignant neoplasm Providers:            Benay Pike. [name] MD, MD Referring MD:         Christian Mate. Terrilee Croak, MD (Referring MD) Medicines:            Propofol per Anesthesia Complications:        No immediate complications. Procedure:            Pre-Anesthesia Assessment:                       - The risks and benefits of the procedure and the                        sedation options and risks were discussed with the                        patient. All questions were answered and informed                        consent was obtained.                       - Patient identification and proposed procedure were                        verified prior to the procedure by the nurse. The                        procedure was verified in the procedure room.                       - ASA Grade Assessment: III - A patient with severe                        systemic disease.                       - After reviewing the risks and benefits, the patient                        was deemed in satisfactory condition to undergo the                        procedure.                       After obtaining informed consent, the colonoscope was                        passed under direct vision. Throughout the procedure,                        the patient's blood pressure, pulse, and oxygen  saturations were monitored continuously. The                        Colonoscope was introduced through the anus and                        advanced to the the cecum, identified by appendiceal                        orifice and ileocecal valve. The colonoscopy was                       performed without difficulty. The patient tolerated the                        procedure well. The quality of the bowel preparation                        was good. The ileocecal valve, appendiceal orifice, and                        rectum were photographed. Findings:      The perianal and digital rectal examinations were normal. Pertinent       negatives include normal sphincter tone and no palpable rectal lesions.      Many small-mouthed diverticula were found in the entire colon. There was       no evidence of diverticular bleeding.      Non-bleeding internal hemorrhoids were found during retroflexion. The       hemorrhoids were Grade I (internal hemorrhoids that do not prolapse).      Two sessile polyps were found in the rectum and sigmoid colon. The       polyps were 2 to 4 mm in size. These polyps were removed with a jumbo       cold forceps. Resection and retrieval were complete.      The exam was otherwise without abnormality. Impression:           - Diverticulosis in the entire examined colon. There                        was no evidence of diverticular bleeding.                       - Non-bleeding internal hemorrhoids.                       - Two 2 to 4 mm polyps in the rectum and in the sigmoid                        colon, removed with a jumbo cold forceps. Resected and                        retrieved.                       - The examination was otherwise normal. Recommendation:       - Patient has a contact number available for                        emergencies. The signs and symptoms of  potential                        delayed complications were discussed with the patient.                        Return to normal activities tomorrow. Written discharge                        instructions were provided to the patient.                       - Resume previous diet.                       - Continue present medications.                       - Repeat  colonoscopy is recommended for surveillance.                        The colonoscopy date will be determined after pathology                        results from today's exam become available for review.                       - Return to GI office PRN.                       - The findings and recommendations were discussed with                        the patient and their spouse. Procedure Code(s):    --- Professional ---                       [phone], Colonoscopy, flexible; with biopsy, single or                        multiple Diagnosis Code(s):    --- Professional ---                       K57.30, Diverticulosis of large intestine without                        perforation or abscess without bleeding                       D12.5, Benign neoplasm of sigmoid colon                       K62.1, Rectal polyp                       K64.0, First degree hemorrhoids                       Z12.11, Encounter for screening for malignant neoplasm                        of colon CPT copyright 2017 American Medical Association. All rights reserved. The codes documented in this report are preliminary and upon coder review may  be revised to meet current compliance requirements. Efrain Sella MD, MD 11/19/2017 1:21:12 PM This report has been signed electronically. Number of Addenda: 0 Note Initiated On: 11/19/2017 12:53 PM Scope Withdrawal Time: 0 hours 6 minutes 42 seconds  Total Procedure Duration: 0 hours 15 minutes 29 seconds       [facility]

## 2017-11-20 ENCOUNTER — Encounter: Payer: Self-pay | Admitting: Internal Medicine

## 2017-11-20 NOTE — Anesthesia Postprocedure Evaluation (Signed)
Anesthesia Post Note  Patient: April Best  Procedure(s) Performed: COLONOSCOPY WITH PROPOFOL (N/A )  Patient location during evaluation: Endoscopy Anesthesia Type: General Level of consciousness: awake and alert Pain management: pain level controlled Vital Signs Assessment: post-procedure vital signs reviewed and stable Respiratory status: spontaneous breathing, nonlabored ventilation and respiratory function stable Cardiovascular status: blood pressure returned to baseline and stable Postop Assessment: no apparent nausea or vomiting Anesthetic complications: no     Last Vitals:  Vitals:   11/19/17 1330 11/19/17 1335  BP: 125/65   Pulse: (!) 52   Resp: (!) 24   Temp:    SpO2: 99% 99%    Last Pain:  Vitals:   11/20/17 0733  TempSrc:   PainSc: 0-No pain                 Alphonsus Sias

## 2017-11-21 LAB — SURGICAL PATHOLOGY

## 2017-12-15 ENCOUNTER — Other Ambulatory Visit: Payer: Self-pay | Admitting: Physician Assistant

## 2017-12-15 DIAGNOSIS — G4700 Insomnia, unspecified: Secondary | ICD-10-CM

## 2017-12-15 DIAGNOSIS — I10 Essential (primary) hypertension: Secondary | ICD-10-CM

## 2017-12-15 DIAGNOSIS — F410 Panic disorder [episodic paroxysmal anxiety] without agoraphobia: Secondary | ICD-10-CM

## 2017-12-15 MED ORDER — AMLODIPINE BESYLATE 10 MG PO TABS
ORAL_TABLET | ORAL | 0 refills | Status: DC
Start: 2017-12-15 — End: 2018-04-03

## 2017-12-15 MED ORDER — ESCITALOPRAM OXALATE 20 MG PO TABS: ORAL_TABLET | ORAL | 0 refills | Status: DC

## 2017-12-15 MED ORDER — HYDROCHLOROTHIAZIDE 25 MG PO TABS: 25.0000 mg | ORAL_TABLET | Freq: Every day | ORAL | 0 refills | Status: DC

## 2017-12-15 MED ORDER — TRAZODONE HCL 100 MG PO TABS: ORAL_TABLET | ORAL | 0 refills | Status: DC

## 2017-12-15 NOTE — Telephone Encounter (Signed)
Pt called needing refills on   Trazodone 100 mg  Hydrodiuril 25 mg  Lexapro 20mg   Amlodipine 10 mg  Pharmacy has changed to    [facility]  Thanks teri

## 2018-01-07 ENCOUNTER — Inpatient Hospital Stay: Payer: PPO | Attending: Internal Medicine

## 2018-01-07 DIAGNOSIS — Z801 Family history of malignant neoplasm of trachea, bronchus and lung: Secondary | ICD-10-CM | POA: Insufficient documentation

## 2018-01-07 DIAGNOSIS — F17210 Nicotine dependence, cigarettes, uncomplicated: Secondary | ICD-10-CM | POA: Diagnosis not present

## 2018-01-07 DIAGNOSIS — I10 Essential (primary) hypertension: Secondary | ICD-10-CM | POA: Diagnosis not present

## 2018-01-07 LAB — CBC WITH DIFFERENTIAL/PLATELET
BASOS ABS: 0.1 10*3/uL (ref 0–0.1)
BASOS PCT: 1 %
EOS ABS: 0.2 10*3/uL (ref 0–0.7)
EOS PCT: 2 %
HCT: 41.3 % (ref 35.0–47.0)
Hemoglobin: 14.2 g/dL (ref 12.0–16.0)
Lymphocytes Relative: 27 %
Lymphs Abs: 2.3 10*3/uL (ref 1.0–3.6)
MCH: 33.1 pg (ref 26.0–34.0)
MCHC: 34.3 g/dL (ref 32.0–36.0)
MCV: 96.5 fL (ref 80.0–100.0)
Monocytes Absolute: 0.7 10*3/uL (ref 0.2–0.9)
Monocytes Relative: 8 %
NEUTROS PCT: 62 %
Neutro Abs: 5.2 10*3/uL (ref 1.4–6.5)
PLATELETS: 298 10*3/uL (ref 150–440)
RBC: 4.28 MIL/uL (ref 3.80–5.20)
RDW: 12.7 % (ref 11.5–14.5)
WBC: 8.5 10*3/uL (ref 3.6–11.0)

## 2018-01-09 ENCOUNTER — Other Ambulatory Visit: Payer: Self-pay

## 2018-01-09 ENCOUNTER — Inpatient Hospital Stay: Payer: PPO

## 2018-01-09 ENCOUNTER — Other Ambulatory Visit: Payer: BLUE CROSS/BLUE SHIELD

## 2018-01-09 ENCOUNTER — Inpatient Hospital Stay (HOSPITAL_BASED_OUTPATIENT_CLINIC_OR_DEPARTMENT_OTHER): Payer: PPO | Admitting: Internal Medicine

## 2018-01-09 DIAGNOSIS — I10 Essential (primary) hypertension: Secondary | ICD-10-CM

## 2018-01-09 DIAGNOSIS — Z801 Family history of malignant neoplasm of trachea, bronchus and lung: Secondary | ICD-10-CM | POA: Diagnosis not present

## 2018-01-09 DIAGNOSIS — F17210 Nicotine dependence, cigarettes, uncomplicated: Secondary | ICD-10-CM

## 2018-01-09 NOTE — Progress Notes (Signed)
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   More recently patient's mother passed away.   Patient denies any nausea vomiting but denies any swelling in the legs.  No weight loss or weight gain.   REVIEW OF SYSTEMS:  A complete 10 point review of system is done which is negative except mentioned above/history of present illness.   PAST MEDICAL HISTORY :  Past Medical History:  Diagnosis Date  . Allergy   . Annular oral lichen planus   . Anxiety   . Depression   . Guaiac positive stools    history  . H. pylori infection   . 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 History of palpitations   . Hyperlipidemia   . Hypertension   . Migraine   . Osteoporosis   . Panic disorder   . Psoriasis     PAST SURGICAL HISTORY :   Past Surgical History:  Procedure Laterality Date  . ABDOMINAL HYSTERECTOMY  1983   Ovaries have been removed.  . ABDOMINOPLASTY    . APPENDECTOMY    . BREAST BIOPSY Right    neg- core  . COLONOSCOPY  03/2006  . COLONOSCOPY WITH PROPOFOL N/A 11/19/2017   Procedure: COLONOSCOPY WITH PROPOFOL;  Surgeon: [place], Benay Pike, MD;  Location: [facility] ENDOSCOPY;  Service: Gastroenterology;  Laterality: N/A;  . RHINOPLASTY  2002  . UPPER GI ENDOSCOPY  2008    FAMILY HISTORY :   Family History  Problem Relation Age of Onset  . Hypertension Mother   . Hyperlipidemia Mother   . Hypothyroidism Mother   . Fibromyalgia Mother   . Lung cancer Father   . Coronary artery disease Father   . Hyperlipidemia Father   . Hypertension Father   . Heart  attack Father   . Hyperlipidemia Sister   . Hypertension Sister   . Fibromyalgia Sister   . ADD / ADHD Brother   . Hemochromatosis Sister   . Breast cancer Neg Hx     SOCIAL HISTORY:   Social History   Tobacco Use  . Smoking status: Current Every Day Smoker    Packs/day: 0.50    Years: 40.00    Pack years: 20.00    Types: Cigarettes  . Smokeless tobacco: Never Used  . Tobacco comment: pt declines smoking cessation  Substance Use Topics  . Alcohol use: Not Currently    Alcohol/week: 0.0 oz  . Drug use: No    ALLERGIES:  is allergic to augmentin  [amoxicillin-pot clavulanate] and lisinopril.  MEDICATIONS:  Current Outpatient Medications  Medication Sig Dispense Refill  . amLODipine (NORVASC) 10 MG tablet TAKE 1 TABLET BY MOUTH DAILY GENERIC EQUIVALENT FOR NORVASC 90 tablet 0  . calcipotriene (DOVONOX) 0.005 % ointment Apply topically 2 (two) times daily.    . clobetasol ointment (TEMOVATE) 0.05 % APP TOPICALLY TO GLUTEAL AREA ONCE D PRF DERMATITIS  1  . escitalopram (LEXAPRO) 20 MG tablet TAKE 1 TABLET BY MOUTH EVERY DAY GENERIC EQUIVALENT FOR LEXAPRO 90 tablet 0  . fluticasone (FLONASE) 50 MCG/ACT nasal spray SHAKE LIQUID AND USE 2 SPRAYS IN EACH NOSTRIL EVERY DAY 16 g 0  .  hydrochlorothiazide (HYDRODIURIL) 25 MG tablet Take 1 tablet (25 mg total) by mouth daily. 90 tablet 0  . loratadine (CLARITIN) 10 MG tablet Take 10 mg by mouth daily.    . traZODone (DESYREL) 100 MG tablet TAKE 1 TABLET BY MOUTH AT BEDTIME GENERIC EQUIVALENT FOR DESYREL 90 tablet 0   No current facility-administered medications for this visit.     PHYSICAL EXAMINATION: ECOG PERFORMANCE STATUS: 0 - Asymptomatic  BP 133/66 (Patient Position: Sitting)   Pulse 76   Temp 98.5 F (36.9 C) (Tympanic)   Resp 20   Ht 5\' 1"  (1.549 m)   Wt 106 lb (48.1 kg)   BMI 20.03 kg/m   Filed Weights   01/09/18 1401  Weight: 106 lb (48.1 kg)    GENERAL: Well-nourished well-developed; Alert, no distress and  comfortable.   She is alone. EYES: no pallor or icterus OROPHARYNX: no thrush or ulceration; good dentition  NECK: supple, no masses felt LYMPH:  no palpable lymphadenopathy in the cervical, axillary or inguinal regions LUNGS: clear to auscultation and  No wheeze or crackles HEART/CVS: regular rate & rhythm and no murmurs; No lower extremity edema ABDOMEN:abdomen soft, non-tender and normal bowel sounds Musculoskeletal:no cyanosis of digits and no clubbing  PSYCH: alert & oriented x 3 with fluent speech NEURO: no focal motor/sensory deficits SKIN:  no rashes or significant lesions  LABORATORY DATA:  I have reviewed the data as listed    Component Value Date/Time   NA 138 05/05/2017 1653   NA 140 01/04/2015 1142   K 3.5 05/05/2017 1653   CL 97 (L) 05/05/2017 1653   CO2 34 (H) 05/05/2017 1653   GLUCOSE 115 (H) 05/05/2017 1653   BUN 14 05/05/2017 1653   BUN 17 01/04/2015 1142   CREATININE 0.57 05/05/2017 1653   CALCIUM 10.0 05/05/2017 1653   PROT 6.4 05/05/2017 1653   PROT 7.1 01/04/2015 1142   ALBUMIN 4.3 10/09/2016 1058   ALBUMIN 5.1 (H) 01/04/2015 1142   AST 18 05/05/2017 1653   ALT 28 05/05/2017 1653   ALKPHOS 91 10/09/2016 1058   BILITOT 0.2 05/05/2017 1653   BILITOT 0.3 01/04/2015 1142   GFRNONAA 97 05/05/2017 1653   GFRAA 113 05/05/2017 1653    No results found for: SPEP, UPEP  Lab Results  Component Value Date   WBC 8.5 01/07/2018   NEUTROABS 5.2 01/07/2018   HGB 14.2 01/07/2018   HCT 41.3 01/07/2018   MCV 96.5 01/07/2018   PLT 298 01/07/2018      Chemistry      Component Value Date/Time   NA 138 05/05/2017 1653   NA 140 01/04/2015 1142   K 3.5 05/05/2017 1653   CL 97 (L) 05/05/2017 1653   CO2 34 (H) 05/05/2017 1653   BUN 14 05/05/2017 1653   BUN 17 01/04/2015 1142   CREATININE 0.57 05/05/2017 1653   GLU 102 09/05/2014      Component Value Date/Time   CALCIUM 10.0 05/05/2017 1653   ALKPHOS 91 10/09/2016 1058   AST 18 05/05/2017 1653   ALT 28  05/05/2017 1653   BILITOT 0.2 05/05/2017 1653   BILITOT 0.3 01/04/2015 1142       ASSESSMENT & PLAN:   [name] ([place]) # HEREDITARY  Hemochromatosis/ 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  Patient continues to be asymptomatic.  # Recommend phlebotomy if greater ferritin than 150 or iron saturation greater than 50% [which is higher].   No phlebotomy today.  Follow-up  in 6 months with CBC/ iron studies-ferritin- 1 week prior; possible phlebotomy.       Cammie Sickle, MD 01/09/2018 9:42 PM

## 2018-01-09 NOTE — Assessment & Plan Note (Addendum)
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  Patient continues to be asymptomatic.  # Recommend phlebotomy if greater ferritin than 150 or iron saturation greater than 50% [which is higher].   No phlebotomy today.  Follow-up in 6 months with CBC/ iron studies-ferritin- 1 week prior; possible phlebotomy.

## 2018-02-22 ENCOUNTER — Other Ambulatory Visit: Payer: Self-pay | Admitting: Physician Assistant

## 2018-02-23 NOTE — Telephone Encounter (Signed)
Medication was discontinued on 04/23/17 with change in therapy.

## 2018-02-23 NOTE — Telephone Encounter (Signed)
Patient reports she is not taking.

## 2018-02-23 NOTE — Telephone Encounter (Signed)
Please verify with patient if she is taking bupropion. Our records indicate she had stopped medication.

## 2018-03-11 ENCOUNTER — Other Ambulatory Visit: Payer: Self-pay | Admitting: Physician Assistant

## 2018-03-11 DIAGNOSIS — I10 Essential (primary) hypertension: Secondary | ICD-10-CM

## 2018-03-11 DIAGNOSIS — G4700 Insomnia, unspecified: Secondary | ICD-10-CM

## 2018-03-11 NOTE — Telephone Encounter (Signed)
Please schedule for physical and follow up to assess for medications and go over maintenance.

## 2018-04-03 ENCOUNTER — Other Ambulatory Visit: Payer: Self-pay | Admitting: Physician Assistant

## 2018-04-03 DIAGNOSIS — I10 Essential (primary) hypertension: Secondary | ICD-10-CM

## 2018-04-03 NOTE — Telephone Encounter (Signed)
Pt needing refill on:  amLODipine (NORVASC) 10 MG tablet - pt leaving for vacation today.  Needing refill.  Please fill at:  [facility] #[id number] [address], [place] [phone] (Phone) [phone] (Fax)   Thanks, [place]

## 2018-04-03 NOTE — Telephone Encounter (Signed)
Please schedule for complete physical exam

## 2018-04-23 ENCOUNTER — Other Ambulatory Visit: Payer: Self-pay | Admitting: Physician Assistant

## 2018-04-23 DIAGNOSIS — F410 Panic disorder [episodic paroxysmal anxiety] without agoraphobia: Secondary | ICD-10-CM

## 2018-04-24 NOTE — Telephone Encounter (Signed)
Please schedule for CPE and follow, haven't seen her for chronic follow up > 1 year.

## 2018-06-11 ENCOUNTER — Other Ambulatory Visit: Payer: Self-pay | Admitting: Physician Assistant

## 2018-06-11 DIAGNOSIS — I10 Essential (primary) hypertension: Secondary | ICD-10-CM

## 2018-06-11 DIAGNOSIS — G4700 Insomnia, unspecified: Secondary | ICD-10-CM

## 2018-06-12 ENCOUNTER — Telehealth: Payer: Self-pay | Admitting: Physician Assistant

## 2018-06-12 DIAGNOSIS — I10 Essential (primary) hypertension: Secondary | ICD-10-CM

## 2018-06-12 MED ORDER — HYDROCHLOROTHIAZIDE 25 MG PO TABS: 25.0000 mg | ORAL_TABLET | Freq: Every day | ORAL | 0 refills | Status: DC

## 2018-06-12 NOTE — Telephone Encounter (Signed)
Please review. Pharmacy requesting 90 day supply. Thanks!

## 2018-06-12 NOTE — Telephone Encounter (Signed)
We received a fax from [facility] requesting a 90 day supply of hydrochlorothiazide (HYDRODIURIL) 25 MG tablet in stead of 30 day supply.  Please advise.

## 2018-06-23 ENCOUNTER — Ambulatory Visit: Payer: BLUE CROSS/BLUE SHIELD | Admitting: Physician Assistant

## 2018-06-24 ENCOUNTER — Encounter: Payer: Self-pay | Admitting: Physician Assistant

## 2018-06-24 ENCOUNTER — Ambulatory Visit (INDEPENDENT_AMBULATORY_CARE_PROVIDER_SITE_OTHER): Payer: PPO | Admitting: Physician Assistant

## 2018-06-24 VITALS — BP 137/74 | HR 74 | Temp 98.7°F | Resp 16 | Wt 113.0 lb

## 2018-06-24 DIAGNOSIS — J0100 Acute maxillary sinusitis, unspecified: Secondary | ICD-10-CM | POA: Diagnosis not present

## 2018-06-24 DIAGNOSIS — J40 Bronchitis, not specified as acute or chronic: Secondary | ICD-10-CM | POA: Diagnosis not present

## 2018-06-24 MED ORDER — BENZONATATE 200 MG PO CAPS: 200.0000 mg | ORAL_CAPSULE | Freq: Two times a day (BID) | ORAL | 0 refills | Status: DC | PRN

## 2018-06-24 MED ORDER — PREDNISONE 10 MG (21) PO TBPK: ORAL_TABLET | ORAL | 0 refills | Status: DC

## 2018-06-24 MED ORDER — DOXYCYCLINE HYCLATE 100 MG PO TABS: 100.0000 mg | ORAL_TABLET | Freq: Two times a day (BID) | ORAL | 0 refills | Status: DC

## 2018-06-24 NOTE — Progress Notes (Signed)
Patient: April Best Female    DOB: September 22, 1950   68 y.o.   MRN: [id number] Visit Date: 06/24/2018  Today's Provider: Mar Daring, PA-C   Chief Complaint  Patient presents with  . Cough   Subjective:     Cough  The current episode started 1 to 4 weeks ago (2 weeks). The cough is productive of sputum. Associated symptoms include chills, ear pain, headaches, myalgias, nasal congestion, rhinorrhea, a sore throat and sweats. Pertinent negatives include no chest pain, ear congestion, fever, heartburn, hemoptysis, postnasal drip, rash, shortness of breath, weight loss or wheezing. Nothing aggravates the symptoms. Treatments tried: claritin and Motrin.   Patient has cough and sore throat for past 3 weeks. Patient also has symptoms of chills, sweats, ear pain. Muscle aches, nasal congestion, runny nose, and sinus pressure. Patient has been taking Motrin and Claritin with mild relief.     Allergies  Allergen Reactions  . Augmentin  [Amoxicillin-Pot Clavulanate] Nausea And Vomiting    as stated (XR).  Marland Kitchen Lisinopril Cough     Current Outpatient Medications:  .  amLODipine (NORVASC) 10 MG tablet, TAKE 1 TABLET BY MOUTH DAILY. Please schedule office visit before any future refills, Disp: 90 tablet, Rfl: 0 .  clobetasol ointment (TEMOVATE) 0.05 %, APP TOPICALLY TO GLUTEAL AREA ONCE D PRF DERMATITIS, Disp: , Rfl: 1 .  escitalopram (LEXAPRO) 20 MG tablet, TAKE 1 TABLET BY MOUTH EVERY DAY, Disp: 90 tablet, Rfl: 0 .  fluticasone (FLONASE) 50 MCG/ACT nasal spray, SHAKE LIQUID AND USE 2 SPRAYS IN EACH NOSTRIL EVERY DAY, Disp: 16 g, Rfl: 0 .  hydrochlorothiazide (HYDRODIURIL) 25 MG tablet, Take 1 tablet (25 mg total) by mouth daily., Disp: 90 tablet, Rfl: 0 .  loratadine (CLARITIN) 10 MG tablet, Take 10 mg by mouth daily., Disp: , Rfl:  .  traZODone (DESYREL) 100 MG tablet, TAKE 1 TABLET BY MOUTH AT BEDTIME, Disp: 30 tablet, Rfl: 0 .  calcipotriene (DOVONOX) 0.005 % ointment, Apply  topically 2 (two) times daily., Disp: , Rfl:   Review of Systems  Constitutional: Positive for chills. Negative for fever and weight loss.  HENT: Positive for congestion, ear pain, rhinorrhea, sinus pressure, sinus pain and sore throat. Negative for postnasal drip.   Respiratory: Positive for cough. Negative for hemoptysis, shortness of breath and wheezing.   Cardiovascular: Negative for chest pain.  Gastrointestinal: Negative.  Negative for heartburn.  Musculoskeletal: Positive for myalgias.  Skin: Negative for rash.  Neurological: Positive for headaches.    Social History   Tobacco Use  . Smoking status: Current Every Day Smoker    Packs/day: 0.50    Years: 40.00    Pack years: 20.00    Types: Cigarettes  . Smokeless tobacco: Never Used  . Tobacco comment: pt declines smoking cessation  Substance Use Topics  . Alcohol use: Not Currently    Alcohol/week: 0.0 standard drinks      Objective:   BP 137/74 (BP Location: Right Arm, Patient Position: Sitting, Cuff Size: Normal)   Pulse 74   Temp 98.7 F (37.1 C) (Oral)   Resp 16   Wt 113 lb (51.3 kg)   SpO2 96%   BMI 21.35 kg/m  Vitals:   06/24/18 1808  BP: 137/74  Pulse: 74  Resp: 16  Temp: 98.7 F (37.1 C)  TempSrc: Oral  SpO2: 96%  Weight: 113 lb (51.3 kg)     Physical Exam Vitals signs reviewed.  Constitutional:  General: She is not in acute distress.    Appearance: She is well-developed. She is not diaphoretic.  HENT:     Head: Normocephalic and atraumatic.     Right Ear: Hearing, tympanic membrane, ear canal and external ear normal.     Left Ear: Hearing, tympanic membrane, ear canal and external ear normal.     Nose:     Right Sinus: Maxillary sinus tenderness present. No frontal sinus tenderness.     Left Sinus: Maxillary sinus tenderness present. No frontal sinus tenderness.     Mouth/Throat:     Pharynx: Uvula midline. No oropharyngeal exudate.  Neck:     Musculoskeletal: Normal range of  motion and neck supple.     Thyroid: No thyromegaly.     Trachea: No tracheal deviation.  Cardiovascular:     Rate and Rhythm: Normal rate and regular rhythm.     Heart sounds: Normal heart sounds. No murmur. No friction rub. No gallop.   Pulmonary:     Effort: Pulmonary effort is normal. No respiratory distress.     Breath sounds: No stridor. Wheezing (throughout) present. No rhonchi or rales.  Lymphadenopathy:     Cervical: No cervical adenopathy.         Assessment & Plan    1. Acute non-recurrent maxillary sinusitis Worsening symptoms that have not responded to OTC medications. Will give doxycycline as below. Continue allergy medications. Stay well hydrated and get plenty of rest. Call if no symptom improvement or if symptoms worsen. - doxycycline (VIBRA-TABS) 100 MG tablet; Take 1 tablet (100 mg total) by mouth 2 (two) times daily.  Dispense: 20 tablet; Refill: 0  2. Bronchitis Add prednisone and tessalon perles as noted below to doxycycline. Call if symptoms worsening or do not improve. - doxycycline (VIBRA-TABS) 100 MG tablet; Take 1 tablet (100 mg total) by mouth 2 (two) times daily.  Dispense: 20 tablet; Refill: 0 - predniSONE (STERAPRED UNI-PAK 21 TAB) 10 MG (21) TBPK tablet; 6 day taper; take as directed on package instructions  Dispense: 21 tablet; Refill: 0 - benzonatate (TESSALON) 200 MG capsule; Take 1 capsule (200 mg total) by mouth 2 (two) times daily as needed for cough.  Dispense: 20 capsule; Refill: 0     Mar Daring, PA-C  [facility]

## 2018-06-24 NOTE — Patient Instructions (Signed)
Acute Bronchitis, Adult Acute bronchitis is when air tubes (bronchi) in the lungs suddenly get swollen. The condition can make it hard to breathe. It can also cause these symptoms:  A cough.  Coughing up clear, yellow, or green mucus.  Wheezing.  Chest congestion.  Shortness of breath.  A fever.  Body aches.  Chills.  A sore throat. Follow these instructions at home:  Medicines  Take over-the-counter and prescription medicines only as told by your doctor.  If you were prescribed an antibiotic medicine, take it as told by your doctor. Do not stop taking the antibiotic even if you start to feel better. General instructions  Rest.  Drink enough fluids to keep your pee (urine) pale yellow.  Avoid smoking and secondhand smoke. If you smoke and you need help quitting, ask your doctor. Quitting will help your lungs heal faster.  Use an inhaler, cool mist vaporizer, or humidifier as told by your doctor.  Keep all follow-up visits as told by your doctor. This is important. How is this prevented? To lower your risk of getting this condition again:  Wash your hands often with soap and water. If you cannot use soap and water, use hand sanitizer.  Avoid contact with people who have cold symptoms.  Try not to touch your hands to your mouth, nose, or eyes.  Make sure to get the flu shot every year. Contact a doctor if:  Your symptoms do not get better in 2 weeks. Get help right away if:  You cough up blood.  You have chest pain.  You have very bad shortness of breath.  You become dehydrated.  You faint (pass out) or keep feeling like you are going to pass out.  You keep throwing up (vomiting).  You have a very bad headache.  Your fever or chills gets worse. This information is not intended to replace advice given to you by your health care provider. Make sure you discuss any questions you have with your health care provider. Document Released: 11/20/2007 Document  Revised: 01/15/2017 Document Reviewed: 11/22/2015 Elsevier Interactive Patient Education  2019 [employer] Sinusitis, Adult Sinusitis is inflammation of your sinuses. Sinuses are hollow spaces in the bones around your face. Your sinuses are located:  Around your eyes.  In the middle of your forehead.  Behind your nose.  In your cheekbones. Mucus normally drains out of your sinuses. When your nasal tissues become inflamed or swollen, mucus can become trapped or blocked. This allows bacteria, viruses, and fungi to grow, which leads to infection. Most infections of the sinuses are caused by a virus. Sinusitis can develop quickly. It can last for up to 4 weeks (acute) or for more than 12 weeks (chronic). Sinusitis often develops after a cold. What are the causes? This condition is caused by anything that creates swelling in the sinuses or stops mucus from draining. This includes:  Allergies.  Asthma.  Infection from bacteria or viruses.  Deformities or blockages in your nose or sinuses.  Abnormal growths in the nose (nasal polyps).  Pollutants, such as chemicals or irritants in the air.  Infection from fungi (rare). What increases the risk? You are more likely to develop this condition if you:  Have a weak body defense system (immune system).  Do a lot of swimming or diving.  Overuse nasal sprays.  Smoke. What are the signs or symptoms? The main symptoms of this condition are pain and a feeling of pressure around the affected sinuses. Other symptoms include:  Stuffy nose or congestion.  Thick drainage from your nose.  Swelling and warmth over the affected sinuses.  Headache.  Upper toothache.  A cough that may get worse at night.  Extra mucus that collects in the throat or the back of the nose (postnasal drip).  Decreased sense of smell and taste.  Fatigue.  A fever.  Sore throat.  Bad breath. How is this diagnosed? This condition is diagnosed based  on:  Your symptoms.  Your medical history.  A physical exam.  Tests to find out if your condition is acute or chronic. This may include: ? Checking your nose for nasal polyps. ? Viewing your sinuses using a device that has a light (endoscope). ? Testing for allergies or bacteria. ? Imaging tests, such as an MRI or CT scan. In rare cases, a bone biopsy may be done to rule out more serious types of fungal sinus disease. How is this treated? Treatment for sinusitis depends on the cause and whether your condition is chronic or acute.  If caused by a virus, your symptoms should go away on their own within 10 days. You may be given medicines to relieve symptoms. They include: ? Medicines that shrink swollen nasal passages (topical intranasal decongestants). ? Medicines that treat allergies (antihistamines). ? A spray that eases inflammation of the nostrils (topical intranasal corticosteroids). ? Rinses that help get rid of thick mucus in your nose (nasal saline washes).  If caused by bacteria, your health care provider may recommend waiting to see if your symptoms improve. Most bacterial infections will get better without antibiotic medicine. You may be given antibiotics if you have: ? A severe infection. ? A weak immune system.  If caused by narrow nasal passages or nasal polyps, you may need to have surgery. Follow these instructions at home: Medicines  Take, use, or apply over-the-counter and prescription medicines only as told by your health care provider. These may include nasal sprays.  If you were prescribed an antibiotic medicine, take it as told by your health care provider. Do not stop taking the antibiotic even if you start to feel better. Hydrate and humidify   Drink enough fluid to keep your urine pale yellow. Staying hydrated will help to thin your mucus.  Use a cool mist humidifier to keep the humidity level in your home above 50%.  Inhale steam for 10-15 minutes,  3-4 times a day, or as told by your health care provider. You can do this in the bathroom while a hot shower is running.  Limit your exposure to cool or dry air. Rest  Rest as much as possible.  Sleep with your head raised (elevated).  Make sure you get enough sleep each night. General instructions   Apply a warm, moist washcloth to your face 3-4 times a day or as told by your health care provider. This will help with discomfort.  Wash your hands often with soap and water to reduce your exposure to germs. If soap and water are not available, use hand sanitizer.  Do not smoke. Avoid being around people who are smoking (secondhand smoke).  Keep all follow-up visits as told by your health care provider. This is important. Contact a health care provider if:  You have a fever.  Your symptoms get worse.  Your symptoms do not improve within 10 days. Get help right away if:  You have a severe headache.  You have persistent vomiting.  You have severe pain or swelling around your face or  eyes.  You have vision problems.  You develop confusion.  Your neck is stiff.  You have trouble breathing. Summary  Sinusitis is soreness and inflammation of your sinuses. Sinuses are hollow spaces in the bones around your face.  This condition is caused by nasal tissues that become inflamed or swollen. The swelling traps or blocks the flow of mucus. This allows bacteria, viruses, and fungi to grow, which leads to infection.  If you were prescribed an antibiotic medicine, take it as told by your health care provider. Do not stop taking the antibiotic even if you start to feel better.  Keep all follow-up visits as told by your health care provider. This is important. This information is not intended to replace advice given to you by your health care provider. Make sure you discuss any questions you have with your health care provider. Document Released: 06/03/2005 Document Revised: 11/03/2017  Document Reviewed: 11/03/2017 Elsevier Interactive Patient Education  2019 [employer].

## 2018-06-27 ENCOUNTER — Other Ambulatory Visit: Payer: Self-pay | Admitting: Physician Assistant

## 2018-06-27 DIAGNOSIS — I10 Essential (primary) hypertension: Secondary | ICD-10-CM

## 2018-06-30 NOTE — Telephone Encounter (Signed)
Patient is calling requesting a refill on amLODipine (NORVASC) 10 MG tablet.  She states that she has one left and scheduled a appointment for next week.  She uses [facility] on [address].

## 2018-07-01 ENCOUNTER — Other Ambulatory Visit: Payer: Self-pay | Admitting: *Deleted

## 2018-07-01 ENCOUNTER — Other Ambulatory Visit: Payer: Self-pay

## 2018-07-01 DIAGNOSIS — E7800 Pure hypercholesterolemia, unspecified: Secondary | ICD-10-CM

## 2018-07-03 ENCOUNTER — Other Ambulatory Visit: Payer: Self-pay

## 2018-07-03 ENCOUNTER — Inpatient Hospital Stay: Payer: PPO | Attending: Internal Medicine

## 2018-07-03 DIAGNOSIS — I10 Essential (primary) hypertension: Secondary | ICD-10-CM | POA: Diagnosis not present

## 2018-07-03 DIAGNOSIS — F17210 Nicotine dependence, cigarettes, uncomplicated: Secondary | ICD-10-CM | POA: Insufficient documentation

## 2018-07-03 DIAGNOSIS — Z79899 Other long term (current) drug therapy: Secondary | ICD-10-CM | POA: Insufficient documentation

## 2018-07-03 LAB — IRON AND TIBC
IRON: 120 ug/dL (ref 28–170)
SATURATION RATIOS: 43 % — AB (ref 10.4–31.8)
TIBC: 281 ug/dL (ref 250–450)
UIBC: 161 ug/dL

## 2018-07-03 LAB — CBC WITH DIFFERENTIAL/PLATELET
Abs Immature Granulocytes: 0.41 10*3/uL — ABNORMAL HIGH (ref 0.00–0.07)
BASOS ABS: 0.1 10*3/uL (ref 0.0–0.1)
Basophils Relative: 1 %
EOS ABS: 0.3 10*3/uL (ref 0.0–0.5)
EOS PCT: 2 %
HEMATOCRIT: 44.9 % (ref 36.0–46.0)
Hemoglobin: 14.6 g/dL (ref 12.0–15.0)
IMMATURE GRANULOCYTES: 3 %
LYMPHS ABS: 3.5 10*3/uL (ref 0.7–4.0)
LYMPHS PCT: 25 %
MCH: 30.8 pg (ref 26.0–34.0)
MCHC: 32.5 g/dL (ref 30.0–36.0)
MCV: 94.7 fL (ref 80.0–100.0)
MONOS PCT: 8 %
Monocytes Absolute: 1.1 10*3/uL — ABNORMAL HIGH (ref 0.1–1.0)
NRBC: 0 % (ref 0.0–0.2)
Neutro Abs: 8.2 10*3/uL — ABNORMAL HIGH (ref 1.7–7.7)
Neutrophils Relative %: 61 %
Platelets: 440 10*3/uL — ABNORMAL HIGH (ref 150–400)
RBC: 4.74 MIL/uL (ref 3.87–5.11)
RDW: 12 % (ref 11.5–15.5)
WBC: 13.7 10*3/uL — ABNORMAL HIGH (ref 4.0–10.5)

## 2018-07-03 LAB — FERRITIN: Ferritin: 95 ng/mL (ref 11–307)

## 2018-07-08 ENCOUNTER — Encounter: Payer: Self-pay | Admitting: Physician Assistant

## 2018-07-08 ENCOUNTER — Ambulatory Visit (INDEPENDENT_AMBULATORY_CARE_PROVIDER_SITE_OTHER): Payer: PPO | Admitting: Physician Assistant

## 2018-07-08 VITALS — BP 115/71 | HR 70 | Temp 97.8°F | Resp 16 | Ht 61.0 in | Wt 112.2 lb

## 2018-07-08 DIAGNOSIS — Z720 Tobacco use: Secondary | ICD-10-CM | POA: Diagnosis not present

## 2018-07-08 DIAGNOSIS — Z1239 Encounter for other screening for malignant neoplasm of breast: Secondary | ICD-10-CM

## 2018-07-08 DIAGNOSIS — I10 Essential (primary) hypertension: Secondary | ICD-10-CM | POA: Diagnosis not present

## 2018-07-08 DIAGNOSIS — F1011 Alcohol abuse, in remission: Secondary | ICD-10-CM | POA: Diagnosis not present

## 2018-07-08 DIAGNOSIS — R61 Generalized hyperhidrosis: Secondary | ICD-10-CM | POA: Diagnosis not present

## 2018-07-08 DIAGNOSIS — K1379 Other lesions of oral mucosa: Secondary | ICD-10-CM

## 2018-07-08 DIAGNOSIS — E7800 Pure hypercholesterolemia, unspecified: Secondary | ICD-10-CM | POA: Diagnosis not present

## 2018-07-08 DIAGNOSIS — F339 Major depressive disorder, recurrent, unspecified: Secondary | ICD-10-CM

## 2018-07-08 DIAGNOSIS — Z0000 Encounter for general adult medical examination without abnormal findings: Secondary | ICD-10-CM

## 2018-07-08 DIAGNOSIS — Z23 Encounter for immunization: Secondary | ICD-10-CM | POA: Diagnosis not present

## 2018-07-08 MED ORDER — MAGIC MOUTHWASH: 5.0000 mL | Freq: Three times a day (TID) | ORAL | 0 refills | Status: DC | PRN

## 2018-07-08 MED ORDER — BUPROPION HCL ER (SR) 150 MG PO TB12: ORAL_TABLET | ORAL | 0 refills | Status: DC

## 2018-07-08 NOTE — Progress Notes (Signed)
Patient: April Best, Female    DOB: [birth year], 68 y.o.   MRN: [id number] Visit Date: 07/16/2018  Today's Provider: Trinna Post, PA-C   Chief Complaint  Patient presents with  . Medicare Wellness   Subjective:    Annual physical exam April Best is a 68 y.o. female who presents today for health maintenance and complete physical. She feels well. She reports exercising none. She reports she is sleeping well.  02/26/17 Mammogram-BI-RADS 1 02/26/17 BMD-Osteopenia 11/19/17 Colonoscopy-WNL 01/15/2017 Prevnar Pneumovax: Due today  Night Sweats  Reports night sweats going on for four months. She reports she is drenching her sheets. She reports she had a hysterectomy and bilateral oophrectomy when she was around 31 and experienced menopausal symptoms at that time. She reports it is every night. She denies weight loss. She denies any new medications. She does have a 30 pack year smoking history. She has been followed by hematology oncology for hereditary hemachromatosis and except for two recent CBCs when she ws sick, her WBC and RBC counts have been normal and stable. She is on lexapro 20 mg for depression and anxiety but has been on this for many years.   Wt Readings from Last 3 Encounters:  07/10/18 113 lb (51.3 kg)  07/08/18 112 lb 3.2 oz (50.9 kg)  06/24/18 113 lb (51.3 kg)    Mouth Pain  Reports getting mouth guard because she grinds teeth at night. Reports her mouth was dry and the inside of her mouth was getting sore especially around her lips. She has tried biotin, lemon suckers. She purchased mouth wash for mouth sores which helped with mouth sores. Reports lips are raw and she chews on them frequently. Also reports chapped lips. Denies dry eyes.   Tobacco Abuse:   Smoked since age 31, 1 pack per day roughly. Currently smokes.  HTN:   Amlodipine 10 mg daily. HCTZ 25 mg daily. Doing well with these.  BP Readings from Last 3 Encounters:  07/10/18  123/73  07/08/18 115/71  06/24/18 137/74   HLD: Has been high in the past and intended to start medication but medication was never started.   Lipid Panel     Component Value Date/Time   CHOL 235 (H) 07/08/2018 1615   TRIG 121 07/08/2018 1615   HDL 57 07/08/2018 1615   CHOLHDL 4.1 07/08/2018 1615   LDLCALC 154 (H) 07/08/2018 1615    Anxiety And depression: Takes lexapro 20 mg daily. Was taken off wellbutrin while at recovery for alcohol abuse. She would like to take this again as it cut down on her cravings to smoke.   Insomnia: Reports she takes 1/2 or sometimes even 1/4 of trazodone 100 mg to sleep at night. Otherwise, can sleep well.   Alcohol Abuse: Reports she continues to maintain sobriety and attend AA meetings. Her mother, who was very sick, has passed away in the interim.  -----------------------------------------------------------------   Review of Systems  Constitutional: Positive for diaphoresis and fatigue.  HENT: Positive for postnasal drip, rhinorrhea and tinnitus.   Eyes: Negative.   Respiratory: Negative.   Cardiovascular: Negative.   Gastrointestinal: Negative.   Endocrine: Negative.   Genitourinary: Negative.   Musculoskeletal: Negative.   Skin: Negative.   Allergic/Immunologic: Negative.   Neurological: Positive for light-headedness.  Hematological: Negative.   Psychiatric/Behavioral: The patient is nervous/anxious.     Social History      She  reports that she has been smoking cigarettes. She has a  20.00 pack-year smoking history. She has never used smokeless tobacco. She reports previous alcohol use. She reports that she does not use drugs.       Social History   Socioeconomic History  . Marital status: Married    Spouse name: Not on file  . Number of children: Not on file  . Years of education: Not on file  . Highest education level: Not on file  Occupational History  . Not on file  Social Needs  . Financial resource strain: Not on file    . Food insecurity:    Worry: Not on file    Inability: Not on file  . Transportation needs:    Medical: Not on file    Non-medical: Not on file  Tobacco Use  . Smoking status: Current Every Day Smoker    Packs/day: 0.50    Years: 40.00    Pack years: 20.00    Types: Cigarettes  . Smokeless tobacco: Never Used  . Tobacco comment: pt declines smoking cessation  Substance and Sexual Activity  . Alcohol use: Not Currently    Alcohol/week: 0.0 standard drinks  . Drug use: No  . Sexual activity: Not on file  Lifestyle  . Physical activity:    Days per week: Not on file    Minutes per session: Not on file  . Stress: Not on file  Relationships  . Social connections:    Talks on phone: Not on file    Gets together: Not on file    Attends religious service: Not on file    Active member of club or organization: Not on file    Attends meetings of clubs or organizations: Not on file    Relationship status: Not on file  Other Topics Concern  . Not on file  Social History Narrative  . Not on file    Past Medical History:  Diagnosis Date  . Allergy   . Annular oral lichen planus   . Anxiety   . Depression   . Guaiac positive stools    history  . H. pylori infection   . 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 History of palpitations   . Hyperlipidemia   . Hypertension   . Migraine   . Osteoporosis   . Panic disorder   . Psoriasis      Patient Active Problem List   Diagnosis Date Noted  . Alcohol abuse, in remission 07/16/2018  . Macrocytosis without anemia 03/29/2016  . Elevated glucose 02/13/2015  . [name] ([place]) 12/14/2014  . 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 Family history of hemochromatosis 11/02/2014  . Hypercholesteremia 11/02/2014  . Benign hypertension 11/02/2014  . Insomnia 11/02/2014  . OP (osteoporosis) 11/02/2014  . Current tobacco use 11/02/2014  . Oral lichen planus 40/98/1191  . Episodic paroxysmal anxiety disorder  10/02/2007  . Gastrointestinal ulcer due to Helicobacter pylori 47/82/9562  . Headache, migraine 12/07/2004  . Allergic rhinitis 11/19/2004  . Psoriasis 07/31/2004    Past Surgical History:  Procedure Laterality Date  . ABDOMINAL HYSTERECTOMY  1983   Ovaries have been removed.  . ABDOMINOPLASTY    . APPENDECTOMY    . BREAST BIOPSY Right    neg- core  . COLONOSCOPY  03/2006  . COLONOSCOPY WITH PROPOFOL N/A 11/19/2017   Procedure: COLONOSCOPY WITH PROPOFOL;  Surgeon: Toledo, Benay Pike, MD;  Location: [facility] ENDOSCOPY;  Service: Gastroenterology;  Laterality: N/A;  . RHINOPLASTY  2002  . UPPER GI ENDOSCOPY  2008    Family  History        Family Status  Relation Name Status  . Mother  Alive  . Father  Deceased  . Sister  Alive  . Brother  Alive  . Sister  (Not Specified)  . Neg Hx  (Not Specified)        Her family history includes ADD / ADHD in her brother; Coronary artery disease in her father; Fibromyalgia in her mother and sister; Heart attack in her father; Hemochromatosis in her sister; Hyperlipidemia in her father, mother, and sister; Hypertension in her father, mother, and sister; Hypothyroidism in her mother; Lung cancer in her father. There is no history of Breast cancer.      Allergies  Allergen Reactions  . Augmentin  [Amoxicillin-Pot Clavulanate] Nausea And Vomiting    as stated (XR).  Marland Kitchen Lisinopril Cough     Current Outpatient Medications:  .  amLODipine (NORVASC) 10 MG tablet, TAKE 1 TABLET BY MOUTH DAILY. OFFICE VISIT NEEDS TO BE SCHEDULED FOR FURTHER REFILLS., Disp: 90 tablet, Rfl: 0 .  clobetasol ointment (TEMOVATE) 0.05 %, APP TOPICALLY TO GLUTEAL AREA ONCE D PRF DERMATITIS, Disp: , Rfl: 1 .  escitalopram (LEXAPRO) 20 MG tablet, TAKE 1 TABLET BY MOUTH EVERY DAY, Disp: 90 tablet, Rfl: 0 .  fluticasone (FLONASE) 50 MCG/ACT nasal spray, SHAKE LIQUID AND USE 2 SPRAYS IN EACH NOSTRIL EVERY DAY, Disp: 16 g, Rfl: 0 .  hydrochlorothiazide (HYDRODIURIL) 25 MG tablet,  Take 1 tablet (25 mg total) by mouth daily., Disp: 90 tablet, Rfl: 0 .  loratadine (CLARITIN) 10 MG tablet, Take 10 mg by mouth daily., Disp: , Rfl:  .  atorvastatin (LIPITOR) 10 MG tablet, Take 1 tablet (10 mg total) by mouth daily., Disp: 90 tablet, Rfl: 3 .  benzonatate (TESSALON) 200 MG capsule, Take 1 capsule (200 mg total) by mouth 2 (two) times daily as needed for cough., Disp: 20 capsule, Rfl: 0 .  buPROPion (WELLBUTRIN SR) 150 MG 12 hr tablet, Take one pill daily for three days and then one pill twice daily onward., Disp: 180 tablet, Rfl: 0 .  calcipotriene (DOVONOX) 0.005 % ointment, Apply topically 2 (two) times daily., Disp: , Rfl:  .  magic mouthwash SOLN, Take 5 mLs by mouth 3 (three) times daily as needed for mouth pain., Disp: 240 mL, Rfl: 0 .  traZODone (DESYREL) 100 MG tablet, Take 1 tablet (100 mg total) by mouth at bedtime as needed for sleep. TAKE 1 TABLET BY MOUTH AT BEDTIME, Disp: 30 tablet, Rfl: 0   Patient Care Team: Paulene Floor as PCP - General (Physician Assistant)      Objective:   Vitals: BP 115/71 (BP Location: Left Arm, Patient Position: Sitting, Cuff Size: Normal)   Pulse 70   Temp 97.8 F (36.6 C) (Oral)   Resp 16   Ht 5\' 1"  (1.549 m)   Wt 112 lb 3.2 oz (50.9 kg)   BMI 21.20 kg/m    Vitals:   07/08/18 1505  BP: 115/71  Pulse: 70  Resp: 16  Temp: 97.8 F (36.6 C)  TempSrc: Oral  Weight: 112 lb 3.2 oz (50.9 kg)  Height: 5\' 1"  (1.549 m)     Physical Exam Constitutional:      Appearance: Normal appearance.  HENT:     Right Ear: Tympanic membrane and ear canal normal.     Left Ear: Tympanic membrane and ear canal normal.     Nose: Nose normal.     Mouth/Throat:  Lips: No lesions.     Mouth: Mucous membranes are moist.     Tongue: No lesions.     Pharynx: Oropharynx is clear.  Neck:     Musculoskeletal: Neck supple.  Cardiovascular:     Rate and Rhythm: Normal rate and regular rhythm.     Heart sounds: Normal heart sounds.   Pulmonary:     Effort: Pulmonary effort is normal.     Breath sounds: Normal breath sounds.  Abdominal:     General: Abdomen is flat. Bowel sounds are normal.     Palpations: Abdomen is soft.  Lymphadenopathy:     Cervical: No cervical adenopathy.  Skin:    General: Skin is warm and dry.  Neurological:     Mental Status: She is alert and oriented to person, place, and time. Mental status is at baseline.  Psychiatric:        Mood and Affect: Mood normal.        Behavior: Behavior normal.      Depression Screen PHQ 2/9 Scores 07/08/2018 01/15/2017 01/15/2017 01/04/2015  PHQ - 2 Score 0 0 0 0  PHQ- 9 Score 4 - 4 -   6CIT Screen 07/08/2018 01/15/2017  What Year? 0 points 0 points  What month? 0 points 0 points  What time? 0 points 0 points  Count back from 20 0 points 0 points  Months in reverse 0 points 0 points  Repeat phrase 2 points 2 points  Total Score 2 2      Assessment & Plan:     Routine Health Maintenance and Physical Exam  Exercise Activities and Dietary recommendations Goals    . Exercise 150 minutes per week (moderate activity)    . Quit smoking / using tobacco       Immunization History  Administered Date(s) Administered  . Influenza Split 02/21/2010, 04/22/2012, 07/15/2016  . Influenza, High Dose Seasonal PF 03/05/2017, 07/08/2018  . Influenza,inj,Quad PF,6+ Mos 03/23/2015  . Pneumococcal Conjugate-13 01/15/2017  . Pneumococcal Polysaccharide-23 04/22/2012, 07/08/2018  . Tdap 02/21/2010  . Zoster 11/04/2013    Health Maintenance  Topic Date Due  . Hepatitis C Screening  11/02/1950  . MAMMOGRAM  02/27/2019  . TETANUS/TDAP  02/22/2020  . COLONOSCOPY  11/20/2027  . INFLUENZA VACCINE  Completed  . DEXA SCAN  Completed  . PNA vac Low Risk Adult  Completed     Discussed health benefits of physical activity, and encouraged her to engage in regular exercise appropriate for her age and condition.   1. Annual physical exam   2. Need for influenza  vaccination  - Flu vaccine HIGH DOSE PF  3. Need for pneumococcal vaccine  - Pneumococcal polysaccharide vaccine 23-valent greater than or equal to 2yo subcutaneous/IM  4. Breast cancer screening  - MM 3D SCREEN BREAST BILATERAL; Future  5. Hypercholesteremia  Start 10 mg lipitor due to elevated cholesterol and CVD risk >7.5%.  - Lipid Profile  6. Hypertension, unspecified type  Doing well on current dose of amlodipine 10 mg and hctzt 25 mg QD with no adverse effects. Continue current medication.  - Comprehensive Metabolic Panel (CMET)  7. Tobacco abuse  I have counseled > 3 minutes on smoking cessation. Will refer for low dose CT as below.   - buPROPion (WELLBUTRIN SR) 150 MG 12 hr tablet; Take one pill daily for three days and then one pill twice daily onward.  Dispense: 180 tablet; Refill: 0 - Ambulatory Referral for Lung Cancer Scre  8. Mouth  sores  No apparent sores today, will try mouthwash as below.  - magic mouthwash SOLN; Take 5 mLs by mouth 3 (three) times daily as needed for mouth pain.  Dispense: 240 mL; Refill: 0  9. Night sweats  Unclear etiology. She underwent surgical menopause in her 77's. She does not have any weight loss or CBC abnormalities. She does have significant smoking history, have referred for low dose CT.  - TSH - CBC with Differential  10. 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 Continue current medication.    11. Alcohol Abuse, in remission  Continues to attend AA meetings.  Return in about 3 months (around 10/07/2018) for HLD.  The entirety of the information documented in the History of Present Illness, Review of Systems and Physical Exam were personally obtained by me. Portions of this information were initially documented by Lynford Humphrey, CMA and reviewed by me for thoroughness and accuracy.    --------------------------------------------------------------------    Trinna Post, PA-C  [facility]

## 2018-07-09 ENCOUNTER — Telehealth: Payer: Self-pay | Admitting: *Deleted

## 2018-07-09 DIAGNOSIS — Z122 Encounter for screening for malignant neoplasm of respiratory organs: Secondary | ICD-10-CM

## 2018-07-09 DIAGNOSIS — Z87891 Personal history of nicotine dependence: Secondary | ICD-10-CM

## 2018-07-09 LAB — COMPREHENSIVE METABOLIC PANEL
ALT: 20 IU/L (ref 0–32)
AST: 15 IU/L (ref 0–40)
Albumin/Globulin Ratio: 2 (ref 1.2–2.2)
Albumin: 4.3 g/dL (ref 3.8–4.8)
Alkaline Phosphatase: 74 IU/L (ref 39–117)
BUN/Creatinine Ratio: 26 (ref 12–28)
BUN: 20 mg/dL (ref 8–27)
Bilirubin Total: <0.2 mg/dL (ref 0.0–1.2)
CO2: 25 mmol/L (ref 20–29)
Calcium: 9.4 mg/dL (ref 8.7–10.3)
Chloride: 99 mmol/L (ref 96–106)
Creatinine, Ser: 0.76 mg/dL (ref 0.57–1.00)
GFR calc Af Amer: 95 mL/min/{1.73_m2} (ref >59)
GFR calc non Af Amer: 82 mL/min/{1.73_m2} (ref >59)
Globulin, Total: 2.1 g/dL (ref 1.5–4.5)
Glucose: 99 mg/dL (ref 65–99)
Potassium: 3.8 mmol/L (ref 3.5–5.2)
Sodium: 140 mmol/L (ref 134–144)
Total Protein: 6.4 g/dL (ref 6.0–8.5)

## 2018-07-09 LAB — LIPID PANEL
Chol/HDL Ratio: 4.1 ratio (ref 0.0–4.4)
Cholesterol, Total: 235 mg/dL — ABNORMAL HIGH (ref 100–199)
HDL: 57 mg/dL (ref >39)
LDL Calculated: 154 mg/dL — ABNORMAL HIGH (ref 0–99)
Triglycerides: 121 mg/dL (ref 0–149)
VLDL Cholesterol Cal: 24 mg/dL (ref 5–40)

## 2018-07-09 LAB — CBC WITH DIFFERENTIAL/PLATELET
Basophils Absolute: 0.1 10*3/uL (ref 0.0–0.2)
Basos: 1 %
EOS (ABSOLUTE): 0.3 10*3/uL (ref 0.0–0.4)
Eos: 3 %
Hematocrit: 41.3 % (ref 34.0–46.6)
Hemoglobin: 14.3 g/dL (ref 11.1–15.9)
Immature Grans (Abs): 0.1 10*3/uL (ref 0.0–0.1)
Immature Granulocytes: 1 %
Lymphocytes Absolute: 2.6 10*3/uL (ref 0.7–3.1)
Lymphs: 23 %
MCH: 32.2 pg (ref 26.6–33.0)
MCHC: 34.6 g/dL (ref 31.5–35.7)
MCV: 93 fL (ref 79–97)
Monocytes Absolute: 0.8 10*3/uL (ref 0.1–0.9)
Monocytes: 7 %
Neutrophils Absolute: 7.4 10*3/uL — ABNORMAL HIGH (ref 1.4–7.0)
Neutrophils: 65 %
Platelets: 427 10*3/uL (ref 150–450)
RBC: 4.44 x10E6/uL (ref 3.77–5.28)
RDW: 12.6 % (ref 11.7–15.4)
WBC: 11.3 10*3/uL — ABNORMAL HIGH (ref 3.4–10.8)

## 2018-07-09 LAB — TSH: TSH: 0.731 u[IU]/mL (ref 0.450–4.500)

## 2018-07-09 NOTE — Telephone Encounter (Signed)
Received referral for initial lung cancer screening scan. Contacted patient and obtained smoking history,(current, 49 pack year) as well as answering questions related to screening process. Patient denies signs of lung cancer such as weight loss or hemoptysis. Patient denies comorbidity that would prevent curative treatment if lung cancer were found. Patient is scheduled for shared decision making visit and CT scan on 07/21/18 at 130pm.

## 2018-07-10 ENCOUNTER — Inpatient Hospital Stay (HOSPITAL_BASED_OUTPATIENT_CLINIC_OR_DEPARTMENT_OTHER): Payer: PPO | Admitting: Oncology

## 2018-07-10 ENCOUNTER — Encounter: Payer: Self-pay | Admitting: Internal Medicine

## 2018-07-10 ENCOUNTER — Other Ambulatory Visit: Payer: Self-pay | Admitting: Family Medicine

## 2018-07-10 ENCOUNTER — Telehealth: Payer: Self-pay

## 2018-07-10 ENCOUNTER — Inpatient Hospital Stay: Payer: PPO

## 2018-07-10 DIAGNOSIS — F17210 Nicotine dependence, cigarettes, uncomplicated: Secondary | ICD-10-CM | POA: Diagnosis not present

## 2018-07-10 DIAGNOSIS — E7800 Pure hypercholesterolemia, unspecified: Secondary | ICD-10-CM

## 2018-07-10 DIAGNOSIS — I10 Essential (primary) hypertension: Secondary | ICD-10-CM | POA: Diagnosis not present

## 2018-07-10 DIAGNOSIS — G4700 Insomnia, unspecified: Secondary | ICD-10-CM

## 2018-07-10 DIAGNOSIS — Z79899 Other long term (current) drug therapy: Secondary | ICD-10-CM | POA: Diagnosis not present

## 2018-07-10 MED ORDER — ATORVASTATIN CALCIUM 10 MG PO TABS: 10.0000 mg | ORAL_TABLET | Freq: Every day | ORAL | 3 refills | Status: DC

## 2018-07-10 NOTE — Progress Notes (Signed)
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  Today, patient is doing well.  She denies any concerns.  She denies any nausea, vomiting, weight loss or weight gain.    REVIEW OF SYSTEMS:  Review of Systems  Constitutional: Negative.  Negative for chills, fever, malaise/fatigue and weight loss.  HENT: Negative for congestion, ear pain and tinnitus.   Eyes: Negative.  Negative for blurred vision and double vision.  Respiratory: Negative.  Negative for cough, sputum production and shortness of breath.   Cardiovascular: Negative.  Negative for chest pain, palpitations and leg swelling.  Gastrointestinal: Negative.  Negative for abdominal pain, constipation, diarrhea, nausea and vomiting.  Genitourinary: Negative for dysuria, frequency and urgency.  Musculoskeletal: Negative for back pain and falls.  Skin: Negative.  Negative for rash.  Neurological: Negative.  Negative for weakness and headaches.  Endo/Heme/Allergies: Negative.  Does not bruise/bleed easily.  Psychiatric/Behavioral: Negative.  Negative for depression. The patient is not nervous/anxious and does not have insomnia.     PAST MEDICAL HISTORY :  Past Medical History:  Diagnosis Date  . Allergy   . Annular oral lichen planus   . Anxiety   . Depression   . Guaiac positive stools    history  . H. pylori infection   . 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 History of palpitations   . Hyperlipidemia   . Hypertension   . Migraine   . Osteoporosis   . Panic disorder   . Psoriasis     PAST SURGICAL HISTORY  :   Past Surgical History:  Procedure Laterality Date  . ABDOMINAL HYSTERECTOMY  1983   Ovaries have been removed.  . ABDOMINOPLASTY    . APPENDECTOMY    . BREAST BIOPSY Right    neg- core  . COLONOSCOPY  03/2006  . COLONOSCOPY WITH PROPOFOL N/A 11/19/2017   Procedure: COLONOSCOPY WITH PROPOFOL;  Surgeon: Toledo, Benay Pike, MD;  Location: [facility] ENDOSCOPY;  Service: Gastroenterology;  Laterality: N/A;  . RHINOPLASTY  2002  . UPPER GI ENDOSCOPY  2008    FAMILY HISTORY :   Family History  Problem Relation Age of Onset  . Hypertension Mother   . Hyperlipidemia Mother   . Hypothyroidism Mother   . Fibromyalgia Mother   . Lung cancer Father   . Coronary artery disease Father   . Hyperlipidemia Father   . Hypertension Father   . Heart attack Father   . Hyperlipidemia Sister   . Hypertension Sister   . Fibromyalgia Sister   . ADD / ADHD Brother   . Hemochromatosis Sister   . Breast cancer Neg Hx     SOCIAL HISTORY:   Social History   Tobacco Use  . Smoking status: Current Every Day Smoker    Packs/day: 0.50    Years: 40.00    Pack years: 20.00    Types: Cigarettes  . Smokeless tobacco: Never Used  . Tobacco comment: pt declines smoking cessation  Substance Use Topics  . Alcohol use: Not Currently    Alcohol/week: 0.0 standard drinks  . Drug use: No    ALLERGIES:  is allergic to augmentin  [amoxicillin-pot clavulanate] and  lisinopril.  MEDICATIONS:  Current Outpatient Medications  Medication Sig Dispense Refill  . amLODipine (NORVASC) 10 MG tablet TAKE 1 TABLET BY MOUTH DAILY. OFFICE VISIT NEEDS TO BE SCHEDULED FOR FURTHER REFILLS. 90 tablet 0  . benzonatate (TESSALON) 200 MG capsule Take 1 capsule (200 mg total) by mouth 2 (two) times daily as needed for cough. 20 capsule 0  . buPROPion (WELLBUTRIN SR) 150 MG 12 hr tablet Take one pill daily for three days and then one pill twice daily onward. 180 tablet 0  . calcipotriene (DOVONOX) 0.005 % ointment Apply topically 2  (two) times daily.    . clobetasol ointment (TEMOVATE) 0.05 % APP TOPICALLY TO GLUTEAL AREA ONCE D PRF DERMATITIS  1  . escitalopram (LEXAPRO) 20 MG tablet TAKE 1 TABLET BY MOUTH EVERY DAY 90 tablet 0  . fluticasone (FLONASE) 50 MCG/ACT nasal spray SHAKE LIQUID AND USE 2 SPRAYS IN EACH NOSTRIL EVERY DAY 16 g 0  . hydrochlorothiazide (HYDRODIURIL) 25 MG tablet Take 1 tablet (25 mg total) by mouth daily. 90 tablet 0  . loratadine (CLARITIN) 10 MG tablet Take 10 mg by mouth daily.    . magic mouthwash SOLN Take 5 mLs by mouth 3 (three) times daily as needed for mouth pain. 240 mL 0  . atorvastatin (LIPITOR) 10 MG tablet Take 1 tablet (10 mg total) by mouth daily. 90 tablet 3  . traZODone (DESYREL) 100 MG tablet Take 1 tablet (100 mg total) by mouth at bedtime as needed for sleep. TAKE 1 TABLET BY MOUTH AT BEDTIME 30 tablet 0   No current facility-administered medications for this visit.     PHYSICAL EXAMINATION: ECOG PERFORMANCE STATUS: 0 - Asymptomatic  BP 123/73 (BP Location: Left Arm, Patient Position: Sitting, Cuff Size: Normal)   Pulse 63   Temp 97.6 F (36.4 C) (Tympanic)   Resp 16   Wt 113 lb (51.3 kg)   BMI 21.35 kg/m   Filed Weights   07/10/18 1431  Weight: 113 lb (51.3 kg)    Physical Exam Vitals signs and nursing note reviewed.  Constitutional:      Appearance: Normal appearance.  HENT:     Head: Normocephalic and atraumatic.  Eyes:     Pupils: Pupils are equal, round, and reactive to light.  Neck:     Musculoskeletal: Normal range of motion.  Cardiovascular:     Rate and Rhythm: Normal rate and regular rhythm.     Heart sounds: Normal heart sounds. No murmur.  Pulmonary:     Effort: Pulmonary effort is normal.     Breath sounds: Normal breath sounds. No wheezing.  Abdominal:     General: Bowel sounds are normal. There is no distension.     Palpations: Abdomen is soft.     Tenderness: There is no abdominal tenderness.  Musculoskeletal: Normal range of motion.   Skin:    General: Skin is warm and dry.     Findings: No rash.  Neurological:     Mental Status: She is alert and oriented to person, place, and time.  Psychiatric:        Judgment: Judgment normal.     LABORATORY DATA:  I have reviewed the data as listed    Component Value Date/Time   NA 140 07/08/2018 1615   K 3.8 07/08/2018 1615   CL 99 07/08/2018 1615   CO2 25 07/08/2018 1615   GLUCOSE 99 07/08/2018 1615   GLUCOSE 115 (H) 05/05/2017 1653   BUN 20 07/08/2018 1615  CREATININE 0.76 07/08/2018 1615   CREATININE 0.57 05/05/2017 1653   CALCIUM 9.4 07/08/2018 1615   PROT 6.4 07/08/2018 1615   ALBUMIN 4.3 07/08/2018 1615   AST 15 07/08/2018 1615   ALT 20 07/08/2018 1615   ALKPHOS 74 07/08/2018 1615   BILITOT <0.2 07/08/2018 1615   GFRNONAA 82 07/08/2018 1615   GFRNONAA 97 05/05/2017 1653   GFRAA 95 07/08/2018 1615   GFRAA 113 05/05/2017 1653    No results found for: SPEP, UPEP  Lab Results  Component Value Date   WBC 11.3 (H) 07/08/2018   NEUTROABS 7.4 (H) 07/08/2018   HGB 14.3 07/08/2018   HCT 41.3 07/08/2018   MCV 93 07/08/2018   PLT 427 07/08/2018      Chemistry      Component Value Date/Time   NA 140 07/08/2018 1615   K 3.8 07/08/2018 1615   CL 99 07/08/2018 1615   CO2 25 07/08/2018 1615   BUN 20 07/08/2018 1615   CREATININE 0.76 07/08/2018 1615   CREATININE 0.57 05/05/2017 1653   GLU 102 09/05/2014      Component Value Date/Time   CALCIUM 9.4 07/08/2018 1615   ALKPHOS 74 07/08/2018 1615   AST 15 07/08/2018 1615   ALT 20 07/08/2018 1615   BILITOT <0.2 07/08/2018 1615       ASSESSMENT & PLAN:   [name] ([place]) # HEREDITARY  Hemochromatosis/ compound heterozygous C282Y & H63D-in January 2020 iron saturations 43%; ferritin 95.  Patient continues to be asymptomatic.  #Per Dr. Rogue Bussing, recommend phlebotomy if ferritin> 150 or iron saturation > 50%.  She does not require a phlebotomy today.  Plan: No phlebotomy. RTC in 6  months with labs (CBC, iron studies, ferritin) 1 week prior, MD assessment and possible phlebotomy.        Jacquelin Hawking, NP 07/17/2018 3:10 PM

## 2018-07-10 NOTE — Telephone Encounter (Signed)
-----   Message from Trinna Post, [place] sent at 07/10/2018  2:04 PM EST ----- Her cholesterol is elevated with 12.7% CVD risk. Recommend starting cholesterol medication to reduce risks of heart attack and stroke. Can cause some muscle cramps and gi upset. If agreeable please send in 10 mg Lipitor nightly #90 and schedule follow up in office for 3 months with labwork.

## 2018-07-10 NOTE — Telephone Encounter (Signed)
Patient was advised and medication send to pharmacy. 

## 2018-07-16 DIAGNOSIS — F1011 Alcohol abuse, in remission: Secondary | ICD-10-CM | POA: Insufficient documentation

## 2018-07-17 NOTE — Assessment & Plan Note (Signed)
#   HEREDITARY  Hemochromatosis/ compound heterozygous C282Y & H63D-in January 2020 iron saturations 43%; ferritin 95.  Patient continues to be asymptomatic.  #Per Dr. Rogue Bussing, recommend phlebotomy if ferritin> 150 or iron saturation > 50%.  She does not require a phlebotomy today.  Plan: No phlebotomy. RTC in 6 months with labs (CBC, iron studies, ferritin) 1 week prior, MD assessment and possible phlebotomy.

## 2018-07-18 ENCOUNTER — Other Ambulatory Visit: Payer: Self-pay | Admitting: Physician Assistant

## 2018-07-18 DIAGNOSIS — F410 Panic disorder [episodic paroxysmal anxiety] without agoraphobia: Secondary | ICD-10-CM

## 2018-07-20 ENCOUNTER — Telehealth: Payer: Self-pay | Admitting: *Deleted

## 2018-07-20 NOTE — Telephone Encounter (Signed)
Called pt to remind her of her appt for ldct screening on 07-20-2018 @1330 , message left for patient.

## 2018-07-21 ENCOUNTER — Inpatient Hospital Stay: Payer: PPO | Attending: Oncology | Admitting: Oncology

## 2018-07-21 ENCOUNTER — Ambulatory Visit
Admission: RE | Admit: 2018-07-21 | Discharge: 2018-07-21 | Disposition: A | Discharge status: Home / Self Care | Payer: PPO | Source: Ambulatory Visit | Attending: Oncology | Admitting: Oncology

## 2018-07-21 ENCOUNTER — Encounter: Payer: Self-pay | Admitting: Oncology

## 2018-07-21 DIAGNOSIS — F17200 Nicotine dependence, unspecified, uncomplicated: Secondary | ICD-10-CM | POA: Diagnosis not present

## 2018-07-21 DIAGNOSIS — Z87891 Personal history of nicotine dependence: Secondary | ICD-10-CM | POA: Insufficient documentation

## 2018-07-21 DIAGNOSIS — J439 Emphysema, unspecified: Secondary | ICD-10-CM | POA: Diagnosis not present

## 2018-07-21 DIAGNOSIS — Z122 Encounter for screening for malignant neoplasm of respiratory organs: Secondary | ICD-10-CM | POA: Diagnosis not present

## 2018-07-21 NOTE — Progress Notes (Signed)
In accordance with CMS guidelines, patient has met eligibility criteria including age, absence of signs or symptoms of lung cancer.  Social History   Tobacco Use  . Smoking status: Current Every Day Smoker    Packs/day: 1.00    Years: 49.00    Pack years: 49.00    Types: Cigarettes  . Smokeless tobacco: Never Used  . Tobacco comment: pt declines smoking cessation  Substance Use Topics  . Alcohol use: Not Currently    Alcohol/week: 0.0 standard drinks  . Drug use: No     A shared decision-making session was conducted prior to the performance of CT scan. This includes one or more decision aids, includes benefits and harms of screening, follow-up diagnostic testing, over-diagnosis, false positive rate, and total radiation exposure.  Counseling on the importance of adherence to annual lung cancer LDCT screening, impact of co-morbidities, and ability or willingness to undergo diagnosis and treatment is imperative for compliance of the program.  Counseling on the importance of continued smoking cessation for former smokers; the importance of smoking cessation for current smokers, and information about tobacco cessation interventions have been given to patient including [place] and 1800 quit [place] programs.  Written order for lung cancer screening with LDCT has been given to the patient and any and all questions have been answered to the best of my abilities.   Yearly follow up will be coordinated by Burgess Estelle, Thoracic Navigator.  Faythe Casa, NP 07/21/2018 2:43 PM

## 2018-07-22 ENCOUNTER — Telehealth: Payer: Self-pay | Admitting: *Deleted

## 2018-07-22 NOTE — Telephone Encounter (Signed)
Notified patient of LDCT lung cancer screening program results with recommendation for 12 month follow up imaging. Also notified of incidental findings noted below and is encouraged to discuss further with PCP who will receive a copy of this note and/or the CT report. Patient verbalizes understanding.   IMPRESSION: 1. Lung-RADS 2, benign appearance or behavior. Continue annual screening with low-dose chest CT without contrast in 12 months. 2. One vessel coronary atherosclerosis.  Aortic Atherosclerosis (ICD10-I70.0) and Emphysema (ICD10-J43.9).

## 2018-07-24 DIAGNOSIS — J439 Emphysema, unspecified: Secondary | ICD-10-CM | POA: Insufficient documentation

## 2018-08-08 ENCOUNTER — Encounter: Payer: Self-pay | Admitting: *Deleted

## 2018-08-14 ENCOUNTER — Other Ambulatory Visit: Payer: Self-pay | Admitting: Physician Assistant

## 2018-08-14 DIAGNOSIS — G4700 Insomnia, unspecified: Secondary | ICD-10-CM

## 2018-09-25 ENCOUNTER — Other Ambulatory Visit: Payer: Self-pay | Admitting: Physician Assistant

## 2018-09-25 DIAGNOSIS — I10 Essential (primary) hypertension: Secondary | ICD-10-CM

## 2018-10-05 ENCOUNTER — Other Ambulatory Visit: Payer: Self-pay | Admitting: Physician Assistant

## 2018-10-05 DIAGNOSIS — Z720 Tobacco use: Secondary | ICD-10-CM

## 2018-10-05 NOTE — Telephone Encounter (Signed)
Please Review

## 2018-10-06 ENCOUNTER — Other Ambulatory Visit: Payer: Self-pay | Admitting: Family Medicine

## 2018-10-06 DIAGNOSIS — I10 Essential (primary) hypertension: Secondary | ICD-10-CM

## 2018-10-07 ENCOUNTER — Encounter: Payer: Self-pay | Admitting: Physician Assistant

## 2018-10-07 ENCOUNTER — Ambulatory Visit (INDEPENDENT_AMBULATORY_CARE_PROVIDER_SITE_OTHER): Payer: PPO | Admitting: Physician Assistant

## 2018-10-07 DIAGNOSIS — I10 Essential (primary) hypertension: Secondary | ICD-10-CM | POA: Diagnosis not present

## 2018-10-07 DIAGNOSIS — L409 Psoriasis, unspecified: Secondary | ICD-10-CM | POA: Diagnosis not present

## 2018-10-07 DIAGNOSIS — E7800 Pure hypercholesterolemia, unspecified: Secondary | ICD-10-CM

## 2018-10-07 DIAGNOSIS — J439 Emphysema, unspecified: Secondary | ICD-10-CM

## 2018-10-07 DIAGNOSIS — F339 Major depressive disorder, recurrent, unspecified: Secondary | ICD-10-CM | POA: Diagnosis not present

## 2018-10-07 MED ORDER — CLOBETASOL PROPIONATE 0.05 % EX OINT: TOPICAL_OINTMENT | CUTANEOUS | 1 refills | Status: DC

## 2018-10-07 MED ORDER — ROSUVASTATIN CALCIUM 5 MG PO TABS: 5.0000 mg | ORAL_TABLET | Freq: Every day | ORAL | 0 refills | Status: DC

## 2018-10-07 NOTE — Progress Notes (Signed)
Patient: April Best Female    DOB: 05/13/1951   68 y.o.   MRN: [id number] Visit Date: 10/07/2018  Today's Provider: Trinna Post, PA-C   Chief Complaint  Patient presents with  . Hypertension    3 month followup  . Hyperlipidemia  . Depression   Subjective:    Virtual Visit via Video Note  I connected with April Best on 10/07/18 at  9:00 AM EDT by a video enabled telemedicine application and verified that I am speaking with the correct person using two identifiers.   I discussed the limitations of evaluation and management by telemedicine and the availability of in person appointments. The patient expressed understanding and agreed to proceed.  HPI    Depression, Follow-up  She  was last seen for this 3 months ago. Changes made at last visit include none.   She reports excellent compliance with treatment. She is not having side effects.   She reports excellent tolerance of treatment. Current symptoms include: none She feels she is Unchanged since last visit.  ------------------------------------------------------------------------     Hypertension, follow-up:  BP Readings from Last 3 Encounters:  07/10/18 123/73  07/08/18 115/71  06/24/18 137/74    She was last seen for hypertension 3 months ago.  BP at that visit was 115/71. Management since that visit includes none.She reports excellent compliance with treatment. She is not having side effects.  She is not exercising. She is not adherent to low salt diet.   Outside blood pressures are not being checked. She is experiencing none.  Patient denies chest pain, chest pressure/discomfort, claudication, dyspnea, exertional chest pressure/discomfort, fatigue, irregular heart beat, lower extremity edema, near-syncope, orthopnea, palpitations, paroxysmal nocturnal dyspnea, syncope and tachypnea.   Cardiovascular risk factors include advanced age (older than 31 for men, 38 for women) and  hypertension.  Use of agents associated with hypertension: none.   ------------------------------------------------------------------------    Lipid/Cholesterol, Follow-up:   Last seen for this 3 months ago.  Management since that visit includes starting patient on Lipitor 10mg . Reports that since starting this her night sweats have subsided. She does note some muscle aches with this and reports that in the past she has tolerate crestor better but she forgot to mention it.   Last Lipid Panel:    Component Value Date/Time   CHOL 235 (H) 07/08/2018 1615   TRIG 121 07/08/2018 1615   HDL 57 07/08/2018 1615   CHOLHDL 4.1 07/08/2018 1615   LDLCALC 154 (H) 07/08/2018 1615    She reports excellent compliance with treatment. She is having side effects. Patient reports joint pain since starting medication and some swelling of her joints.   Wt Readings from Last 3 Encounters:  07/21/18 113 lb (51.3 kg)  07/10/18 113 lb (51.3 kg)  07/08/18 112 lb 3.2 oz (50.9 kg)   She is still smoking 1/2 pack daily.   Reports historically she has had psoriasis and had seen dermatologist for it with clobetasol. She has not seen dermatologist in a while and ran out of the ointment but reports she used this sparingly for lesions and it worked very well for them.  ------------------------------------------------------------------------  Allergies  Allergen Reactions  . Augmentin  [Amoxicillin-Pot Clavulanate] Nausea And Vomiting    as stated (XR).  Marland Kitchen Lisinopril Cough     Current Outpatient Medications:  .  amLODipine (NORVASC) 10 MG tablet, TAKE 1 TABLET BY MOUTH DAILY. NEED OFFICE VISIT FOR FURTHER REFILLS, Disp: 90 tablet, Rfl: 0 .  atorvastatin (LIPITOR) 10 MG tablet, Take 1 tablet (10 mg total) by mouth daily., Disp: 90 tablet, Rfl: 3 .  benzonatate (TESSALON) 200 MG capsule, Take 1 capsule (200 mg total) by mouth 2 (two) times daily as needed for cough., Disp: 20 capsule, Rfl: 0 .  buPROPion  (WELLBUTRIN SR) 150 MG 12 hr tablet, Take one pill daily for three days and then one pill twice daily onward., Disp: 180 tablet, Rfl: 0 .  calcipotriene (DOVONOX) 0.005 % ointment, Apply topically 2 (two) times daily., Disp: , Rfl:  .  clobetasol ointment (TEMOVATE) 0.05 %, APP TOPICALLY TO GLUTEAL AREA ONCE D PRF DERMATITIS, Disp: , Rfl: 1 .  escitalopram (LEXAPRO) 20 MG tablet, TAKE 1 TABLET BY MOUTH EVERY DAY, Disp: 90 tablet, Rfl: 1 .  fluticasone (FLONASE) 50 MCG/ACT nasal spray, SHAKE LIQUID AND USE 2 SPRAYS IN EACH NOSTRIL EVERY DAY, Disp: 16 g, Rfl: 0 .  hydrochlorothiazide (HYDRODIURIL) 25 MG tablet, Take 1 tablet (25 mg total) by mouth daily., Disp: 90 tablet, Rfl: 0 .  loratadine (CLARITIN) 10 MG tablet, Take 10 mg by mouth daily., Disp: , Rfl:  .  magic mouthwash SOLN, Take 5 mLs by mouth 3 (three) times daily as needed for mouth pain., Disp: 240 mL, Rfl: 0 .  traZODone (DESYREL) 100 MG tablet, TAKE 1 TABLET(100 MG) BY MOUTH AT BEDTIME AS NEEDED FOR SLEEP, Disp: 30 tablet, Rfl: 0  Review of Systems  Social History   Tobacco Use  . Smoking status: Current Every Day Smoker    Packs/day: 1.00    Years: 49.00    Pack years: 49.00    Types: Cigarettes  . Smokeless tobacco: Never Used  . Tobacco comment: pt declines smoking cessation  Substance Use Topics  . Alcohol use: Not Currently    Alcohol/week: 0.0 standard drinks      Objective:   There were no vitals taken for this visit. There were no vitals filed for this visit.   Physical Exam Constitutional:      Appearance: Normal appearance.  Neurological:     Mental Status: She is alert.  Psychiatric:        Mood and Affect: Mood normal.        Behavior: Behavior normal.         Assessment & Plan    1. Hypercholesteremia  We will switch her lipitor 10 mg to crestor 5 mg daily and recheck lipids at next visit.   - rosuvastatin (CRESTOR) 5 MG tablet; Take 1 tablet (5 mg total) by mouth daily.  Dispense: 90  tablet; Refill: 0  2. Psoriasis   - clobetasol ointment (TEMOVATE) 0.05 %; APP TOPICALLY TO GLUTEAL AREA ONCE D PRF DERMATITIS  Dispense: 30 g; Refill: 1  3. Benign hypertension  Stable and continue current amlodipine 10 mg daily. F/u 6 months for HTN and chronic issues.   4. Pulmonary emphysema, unspecified emphysema type ([place])  Continues to smoke, counseled on importance of quiting.   5. Recurrent Major Depressive Episodes  Continue lexapro and wellbutrin.    Patient location: home Provider location: [place] office  Persons involved in the visit: patient, provider     Clint Bolder as a scribe for [employer], PA-C.,have documented all relevant documentation on the behalf of Trinna Post, PA-C,as directed by  Trinna Post, PA-C while in the presence of Trinna Post, PA-C. Trinna Post, PA-C  [facility]

## 2018-10-07 NOTE — Patient Instructions (Signed)

## 2018-12-24 ENCOUNTER — Other Ambulatory Visit: Payer: Self-pay | Admitting: Physician Assistant

## 2018-12-24 DIAGNOSIS — I10 Essential (primary) hypertension: Secondary | ICD-10-CM

## 2019-01-01 ENCOUNTER — Inpatient Hospital Stay: Payer: PPO | Attending: Internal Medicine

## 2019-01-01 ENCOUNTER — Other Ambulatory Visit: Payer: Self-pay

## 2019-01-01 DIAGNOSIS — F1021 Alcohol dependence, in remission: Secondary | ICD-10-CM | POA: Diagnosis not present

## 2019-01-01 DIAGNOSIS — F17210 Nicotine dependence, cigarettes, uncomplicated: Secondary | ICD-10-CM | POA: Diagnosis not present

## 2019-01-01 LAB — COMPREHENSIVE METABOLIC PANEL
ALT: 26 U/L (ref 0–44)
AST: 22 U/L (ref 15–41)
Albumin: 4.3 g/dL (ref 3.5–5.0)
Alkaline Phosphatase: 54 U/L (ref 38–126)
Anion gap: 11 (ref 5–15)
BUN: 11 mg/dL (ref 8–23)
CO2: 27 mmol/L (ref 22–32)
Calcium: 9.6 mg/dL (ref 8.9–10.3)
Chloride: 102 mmol/L (ref 98–111)
Creatinine, Ser: 0.75 mg/dL (ref 0.44–1.00)
GFR calc Af Amer: >60 mL/min (ref >60)
GFR calc non Af Amer: >60 mL/min (ref >60)
Glucose, Bld: 92 mg/dL (ref 70–99)
Potassium: 3.5 mmol/L (ref 3.5–5.1)
Sodium: 140 mmol/L (ref 135–145)
Total Bilirubin: 0.6 mg/dL (ref 0.3–1.2)
Total Protein: 7.2 g/dL (ref 6.5–8.1)

## 2019-01-01 LAB — CBC WITH DIFFERENTIAL/PLATELET
Abs Immature Granulocytes: 0.04 10*3/uL (ref 0.00–0.07)
Basophils Absolute: 0.1 10*3/uL (ref 0.0–0.1)
Basophils Relative: 1 %
Eosinophils Absolute: 0.3 10*3/uL (ref 0.0–0.5)
Eosinophils Relative: 3 %
HCT: 43.5 % (ref 36.0–46.0)
Hemoglobin: 14.3 g/dL (ref 12.0–15.0)
Immature Granulocytes: 1 %
Lymphocytes Relative: 31 %
Lymphs Abs: 2.4 10*3/uL (ref 0.7–4.0)
MCH: 31.3 pg (ref 26.0–34.0)
MCHC: 32.9 g/dL (ref 30.0–36.0)
MCV: 95.2 fL (ref 80.0–100.0)
Monocytes Absolute: 0.6 10*3/uL (ref 0.1–1.0)
Monocytes Relative: 7 %
Neutro Abs: 4.4 10*3/uL (ref 1.7–7.7)
Neutrophils Relative %: 57 %
Platelets: 257 10*3/uL (ref 150–400)
RBC: 4.57 MIL/uL (ref 3.87–5.11)
RDW: 12.4 % (ref 11.5–15.5)
WBC: 7.8 10*3/uL (ref 4.0–10.5)
nRBC: 0 % (ref 0.0–0.2)

## 2019-01-01 LAB — IRON AND TIBC
Iron: 111 ug/dL (ref 28–170)
Saturation Ratios: 36 % — ABNORMAL HIGH (ref 10.4–31.8)
TIBC: 305 ug/dL (ref 250–450)
UIBC: 194 ug/dL

## 2019-01-01 LAB — FERRITIN: Ferritin: 45 ng/mL (ref 11–307)

## 2019-01-04 ENCOUNTER — Other Ambulatory Visit: Payer: Self-pay | Admitting: Physician Assistant

## 2019-01-04 DIAGNOSIS — E7800 Pure hypercholesterolemia, unspecified: Secondary | ICD-10-CM

## 2019-01-04 DIAGNOSIS — I10 Essential (primary) hypertension: Secondary | ICD-10-CM

## 2019-01-07 ENCOUNTER — Other Ambulatory Visit: Payer: Self-pay | Admitting: Physician Assistant

## 2019-01-07 DIAGNOSIS — Z720 Tobacco use: Secondary | ICD-10-CM

## 2019-01-08 ENCOUNTER — Inpatient Hospital Stay: Payer: PPO

## 2019-01-08 ENCOUNTER — Inpatient Hospital Stay (HOSPITAL_BASED_OUTPATIENT_CLINIC_OR_DEPARTMENT_OTHER): Payer: PPO | Admitting: Internal Medicine

## 2019-01-08 ENCOUNTER — Other Ambulatory Visit: Payer: Self-pay

## 2019-01-08 DIAGNOSIS — F17210 Nicotine dependence, cigarettes, uncomplicated: Secondary | ICD-10-CM | POA: Diagnosis not present

## 2019-01-08 DIAGNOSIS — F1021 Alcohol dependence, in remission: Secondary | ICD-10-CM

## 2019-01-08 NOTE — Progress Notes (Signed)
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   Patient denies any nausea vomiting but denies any blood in stools or black or stools.  No swelling in legs.  No joint pains.  Patient continues to be sober for 21 months now.  Review of Systems  Constitutional: Negative for chills, diaphoresis, fever, malaise/fatigue and weight loss.  HENT: Negative for nosebleeds and sore throat.   Eyes: Negative for double vision.  Respiratory: Negative for cough, hemoptysis, sputum production, shortness of breath and wheezing.   Cardiovascular: Negative for chest pain, palpitations, orthopnea and leg swelling.  Gastrointestinal: Negative for abdominal pain, blood in stool, constipation, diarrhea, heartburn, melena, nausea and vomiting.  Genitourinary: Negative for dysuria, frequency and urgency.  Musculoskeletal: Negative for back pain and joint pain.  Skin: Negative.  Negative for itching and rash.  Neurological: Negative for dizziness, tingling, focal weakness, weakness and headaches.  Endo/Heme/Allergies: Does not bruise/bleed easily.  Psychiatric/Behavioral: Negative for depression. The patient is not nervous/anxious and does not have insomnia.      PAST MEDICAL HISTORY :  Past Medical History:  Diagnosis Date  . Allergy   . Annular oral lichen planus   . Anxiety   . Depression   . Guaiac positive stools    history  . H. pylori infection   . 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 History of palpitations   . Hyperlipidemia   . Hypertension   . Migraine   .  Osteoporosis   . Panic disorder   . Psoriasis     PAST SURGICAL HISTORY :   Past Surgical History:  Procedure Laterality Date  . ABDOMINAL HYSTERECTOMY  1983   Ovaries have been removed.  . ABDOMINOPLASTY    . APPENDECTOMY    . BREAST BIOPSY Right    neg- core  . COLONOSCOPY  03/2006  . COLONOSCOPY WITH PROPOFOL N/A 11/19/2017   Procedure: COLONOSCOPY WITH PROPOFOL;  Surgeon: Toledo, Benay Pike, MD;  Location: [facility] ENDOSCOPY;  Service: Gastroenterology;  Laterality: N/A;  . RHINOPLASTY  2002  . UPPER GI ENDOSCOPY  2008    FAMILY HISTORY :   Family History  Problem Relation Age of Onset  . Hypertension Mother   . Hyperlipidemia Mother   . Hypothyroidism Mother   . Fibromyalgia Mother   . Lung cancer Father   . Coronary artery disease Father   . Hyperlipidemia Father   . Hypertension Father   . Heart attack Father   . Hyperlipidemia Sister   . Hypertension Sister   . Fibromyalgia Sister   . ADD / ADHD Brother   . Hemochromatosis Sister   . Breast cancer Neg Hx     SOCIAL HISTORY:   Social History   Tobacco Use  . Smoking status: Current Every Day Smoker    Packs/day: 1.00    Years: 49.00    Pack years: 49.00    Types: Cigarettes  . Smokeless tobacco: Never Used  . Tobacco comment: pt declines smoking cessation  Substance Use Topics  . Alcohol use: Not Currently    Alcohol/week: 0.0 standard drinks  . Drug use: No    ALLERGIES:  is  allergic to augmentin  [amoxicillin-pot clavulanate] and lisinopril.  MEDICATIONS:  Current Outpatient Medications  Medication Sig Dispense Refill  . amLODipine (NORVASC) 10 MG tablet TAKE 1 TABLET BY MOUTH DAILY 90 tablet 0  . buPROPion (WELLBUTRIN SR) 150 MG 12 hr tablet Take 1 tablet (150 mg total) by mouth daily. TAKE 1 TABLET BY MOUTH DAILY FOR 3 DAYS THEN 1 TWICE DAILY 180 tablet 0  . calcipotriene (DOVONOX) 0.005 % ointment Apply topically 2 (two) times daily.    . clobetasol ointment (TEMOVATE) 0.05 % APP TOPICALLY TO  GLUTEAL AREA ONCE D PRF DERMATITIS 30 g 1  . escitalopram (LEXAPRO) 20 MG tablet TAKE 1 TABLET BY MOUTH EVERY DAY 90 tablet 1  . fluticasone (FLONASE) 50 MCG/ACT nasal spray SHAKE LIQUID AND USE 2 SPRAYS IN EACH NOSTRIL EVERY DAY 16 g 0  . hydrochlorothiazide (HYDRODIURIL) 25 MG tablet TAKE 1 TABLET(25 MG) BY MOUTH DAILY 90 tablet 1  . loratadine (CLARITIN) 10 MG tablet Take 10 mg by mouth daily.    . rosuvastatin (CRESTOR) 5 MG tablet TAKE 1 TABLET(5 MG) BY MOUTH DAILY 90 tablet 1  . traZODone (DESYREL) 100 MG tablet TAKE 1 TABLET(100 MG) BY MOUTH AT BEDTIME AS NEEDED FOR SLEEP 30 tablet 0   No current facility-administered medications for this visit.     PHYSICAL EXAMINATION: ECOG PERFORMANCE STATUS: 0 - Asymptomatic  BP (!) 147/73   Pulse 64   Temp 98.4 F (36.9 C)   Resp 20   Wt 117 lb 12.8 oz (53.4 kg)   BMI 22.26 kg/m   Filed Weights   01/08/19 1330  Weight: 117 lb 12.8 oz (53.4 kg)    Physical Exam  Constitutional: She is oriented to person, place, and time and well-developed, well-nourished, and in no distress.  HENT:  Head: Normocephalic and atraumatic.  Mouth/Throat: Oropharynx is clear and moist. No oropharyngeal exudate.  Eyes: Pupils are equal, round, and reactive to light.  Neck: Normal range of motion. Neck supple.  Cardiovascular: Normal rate and regular rhythm.  Pulmonary/Chest: Effort normal and breath sounds normal. No respiratory distress. She has no wheezes.  Abdominal: Soft. Bowel sounds are normal. She exhibits no distension and no mass. There is no abdominal tenderness. There is no rebound and no guarding.  Musculoskeletal: Normal range of motion.        General: No tenderness or edema.  Neurological: She is alert and oriented to person, place, and time.  Skin: Skin is warm.  Psychiatric: Affect normal.    LABORATORY DATA:  I have reviewed the data as listed    Component Value Date/Time   NA 140 01/01/2019 1324   NA 140 07/08/2018 1615   K  3.5 01/01/2019 1324   CL 102 01/01/2019 1324   CO2 27 01/01/2019 1324   GLUCOSE 92 01/01/2019 1324   BUN 11 01/01/2019 1324   BUN 20 07/08/2018 1615   CREATININE 0.75 01/01/2019 1324   CREATININE 0.57 05/05/2017 1653   CALCIUM 9.6 01/01/2019 1324   PROT 7.2 01/01/2019 1324   PROT 6.4 07/08/2018 1615   ALBUMIN 4.3 01/01/2019 1324   ALBUMIN 4.3 07/08/2018 1615   AST 22 01/01/2019 1324   ALT 26 01/01/2019 1324   ALKPHOS 54 01/01/2019 1324   BILITOT 0.6 01/01/2019 1324   BILITOT <0.2 07/08/2018 1615   GFRNONAA >60 01/01/2019 1324   GFRNONAA 97 05/05/2017 1653   GFRAA >60 01/01/2019 1324   GFRAA 113 05/05/2017 1653    No results found for:  SPEP, UPEP  Lab Results  Component Value Date   WBC 7.8 01/01/2019   NEUTROABS 4.4 01/01/2019   HGB 14.3 01/01/2019   HCT 43.5 01/01/2019   MCV 95.2 01/01/2019   PLT 257 01/01/2019      Chemistry      Component Value Date/Time   NA 140 01/01/2019 1324   NA 140 07/08/2018 1615   K 3.5 01/01/2019 1324   CL 102 01/01/2019 1324   CO2 27 01/01/2019 1324   BUN 11 01/01/2019 1324   BUN 20 07/08/2018 1615   CREATININE 0.75 01/01/2019 1324   CREATININE 0.57 05/05/2017 1653   GLU 102 09/05/2014      Component Value Date/Time   CALCIUM 9.6 01/01/2019 1324   ALKPHOS 54 01/01/2019 1324   AST 22 01/01/2019 1324   ALT 26 01/01/2019 1324   BILITOT 0.6 01/01/2019 1324   BILITOT <0.2 07/08/2018 1615       ASSESSMENT & PLAN:   [name] ([place]) # HEREDITARY  Hemochromatosis/ 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  Stable.  #Do not recommend phlebotomy unless ferritin greater than 150 or iron saturation greater than 50%.  No phlebotomy today.  #History of alcoholism -again congratulated patient for being sober.  Plan: # No phlebotomy today # 6 months with labs (CBC, iron studies, ferritin) 1 week prior, MD/ possible phlebotomy- Dr.B       Cammie Sickle, MD 01/08/2019 1:58 PM

## 2019-01-08 NOTE — Assessment & Plan Note (Addendum)
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  Stable.  #Do not recommend phlebotomy unless ferritin greater than 150 or iron saturation greater than 50%.  No phlebotomy today.  #History of alcoholism -again congratulated patient for being sober.  Plan: # No phlebotomy today # 6 months with labs (CBC, iron studies, ferritin) 1 week prior, MD/ possible phlebotomy- Dr.B

## 2019-01-15 ENCOUNTER — Other Ambulatory Visit: Payer: Self-pay | Admitting: Physician Assistant

## 2019-01-15 DIAGNOSIS — F410 Panic disorder [episodic paroxysmal anxiety] without agoraphobia: Secondary | ICD-10-CM

## 2019-03-05 DIAGNOSIS — M222X1 Patellofemoral disorders, right knee: Secondary | ICD-10-CM | POA: Diagnosis not present

## 2019-03-08 DIAGNOSIS — M25561 Pain in right knee: Secondary | ICD-10-CM | POA: Diagnosis not present

## 2019-03-08 DIAGNOSIS — M25661 Stiffness of right knee, not elsewhere classified: Secondary | ICD-10-CM | POA: Diagnosis not present

## 2019-03-15 DIAGNOSIS — M25661 Stiffness of right knee, not elsewhere classified: Secondary | ICD-10-CM | POA: Diagnosis not present

## 2019-03-15 DIAGNOSIS — M25561 Pain in right knee: Secondary | ICD-10-CM | POA: Diagnosis not present

## 2019-03-18 DIAGNOSIS — M25561 Pain in right knee: Secondary | ICD-10-CM | POA: Diagnosis not present

## 2019-03-18 DIAGNOSIS — M25661 Stiffness of right knee, not elsewhere classified: Secondary | ICD-10-CM | POA: Diagnosis not present

## 2019-03-22 DIAGNOSIS — M25561 Pain in right knee: Secondary | ICD-10-CM | POA: Diagnosis not present

## 2019-03-22 DIAGNOSIS — M25661 Stiffness of right knee, not elsewhere classified: Secondary | ICD-10-CM | POA: Diagnosis not present

## 2019-03-22 DIAGNOSIS — M2241 Chondromalacia patellae, right knee: Secondary | ICD-10-CM | POA: Diagnosis not present

## 2019-03-24 ENCOUNTER — Other Ambulatory Visit: Payer: Self-pay | Admitting: Physician Assistant

## 2019-03-24 DIAGNOSIS — I10 Essential (primary) hypertension: Secondary | ICD-10-CM

## 2019-03-24 MED ORDER — AMLODIPINE BESYLATE 10 MG PO TABS: ORAL_TABLET | ORAL | 0 refills | Status: DC

## 2019-03-24 NOTE — Telephone Encounter (Signed)
[facility] faxed refill request for the following medications: ° °amLODipine (NORVASC) 10 MG tablet  ° ° °Please advise. °

## 2019-03-25 DIAGNOSIS — M25561 Pain in right knee: Secondary | ICD-10-CM | POA: Diagnosis not present

## 2019-03-25 DIAGNOSIS — M25661 Stiffness of right knee, not elsewhere classified: Secondary | ICD-10-CM | POA: Diagnosis not present

## 2019-04-19 DIAGNOSIS — M25561 Pain in right knee: Secondary | ICD-10-CM | POA: Diagnosis not present

## 2019-05-01 DIAGNOSIS — M25561 Pain in right knee: Secondary | ICD-10-CM | POA: Diagnosis not present

## 2019-05-05 DIAGNOSIS — M1711 Unilateral primary osteoarthritis, right knee: Secondary | ICD-10-CM | POA: Diagnosis not present

## 2019-07-01 ENCOUNTER — Other Ambulatory Visit: Payer: Self-pay

## 2019-07-02 ENCOUNTER — Inpatient Hospital Stay: Payer: PPO | Attending: Internal Medicine

## 2019-07-02 ENCOUNTER — Other Ambulatory Visit: Payer: Self-pay

## 2019-07-02 DIAGNOSIS — M549 Dorsalgia, unspecified: Secondary | ICD-10-CM | POA: Diagnosis not present

## 2019-07-02 DIAGNOSIS — Z90722 Acquired absence of ovaries, bilateral: Secondary | ICD-10-CM | POA: Insufficient documentation

## 2019-07-02 DIAGNOSIS — Z90710 Acquired absence of both cervix and uterus: Secondary | ICD-10-CM | POA: Insufficient documentation

## 2019-07-02 DIAGNOSIS — F17210 Nicotine dependence, cigarettes, uncomplicated: Secondary | ICD-10-CM | POA: Diagnosis not present

## 2019-07-02 DIAGNOSIS — F1021 Alcohol dependence, in remission: Secondary | ICD-10-CM | POA: Insufficient documentation

## 2019-07-02 DIAGNOSIS — M5416 Radiculopathy, lumbar region: Secondary | ICD-10-CM | POA: Diagnosis not present

## 2019-07-02 DIAGNOSIS — M545 Low back pain: Secondary | ICD-10-CM | POA: Diagnosis not present

## 2019-07-02 LAB — CBC WITH DIFFERENTIAL/PLATELET
Abs Immature Granulocytes: 0.08 10*3/uL — ABNORMAL HIGH (ref 0.00–0.07)
Basophils Absolute: 0.1 10*3/uL (ref 0.0–0.1)
Basophils Relative: 1 %
Eosinophils Absolute: 0.3 10*3/uL (ref 0.0–0.5)
Eosinophils Relative: 3 %
HCT: 44.2 % (ref 36.0–46.0)
Hemoglobin: 14.4 g/dL (ref 12.0–15.0)
Immature Granulocytes: 1 %
Lymphocytes Relative: 25 %
Lymphs Abs: 2.4 10*3/uL (ref 0.7–4.0)
MCH: 31.4 pg (ref 26.0–34.0)
MCHC: 32.6 g/dL (ref 30.0–36.0)
MCV: 96.3 fL (ref 80.0–100.0)
Monocytes Absolute: 0.8 10*3/uL (ref 0.1–1.0)
Monocytes Relative: 8 %
Neutro Abs: 6.1 10*3/uL (ref 1.7–7.7)
Neutrophils Relative %: 62 %
Platelets: 303 10*3/uL (ref 150–400)
RBC: 4.59 MIL/uL (ref 3.87–5.11)
RDW: 12.5 % (ref 11.5–15.5)
WBC: 9.7 10*3/uL (ref 4.0–10.5)
nRBC: 0 % (ref 0.0–0.2)

## 2019-07-02 LAB — IRON AND TIBC
Iron: 90 ug/dL (ref 28–170)
Saturation Ratios: 29 % (ref 10.4–31.8)
TIBC: 308 ug/dL (ref 250–450)
UIBC: 218 ug/dL

## 2019-07-02 LAB — FERRITIN: Ferritin: 66 ng/mL (ref 11–307)

## 2019-07-06 ENCOUNTER — Other Ambulatory Visit: Payer: Self-pay | Admitting: Physician Assistant

## 2019-07-06 DIAGNOSIS — I10 Essential (primary) hypertension: Secondary | ICD-10-CM

## 2019-07-06 DIAGNOSIS — E7800 Pure hypercholesterolemia, unspecified: Secondary | ICD-10-CM

## 2019-07-06 NOTE — Telephone Encounter (Signed)
Requested Prescriptions  Pending Prescriptions Disp Refills  . rosuvastatin (CRESTOR) 5 MG tablet [Pharmacy Med Name: ROSUVASTATIN 5MG  TABLETS] 90 tablet 1    Sig: TAKE 1 TABLET(5 MG) BY MOUTH DAILY     Cardiovascular:  Antilipid - Statins Failed - 07/06/2019  3:38 AM      Failed - Total Cholesterol in normal range and within 360 days    Cholesterol, Total  Date Value Ref Range Status  07/08/2018 235 (H) 100 - 199 mg/dL Final         Failed - LDL in normal range and within 360 days    LDL Calculated  Date Value Ref Range Status  07/08/2018 154 (H) 0 - 99 mg/dL Final         Failed - HDL in normal range and within 360 days    HDL  Date Value Ref Range Status  07/08/2018 57 >39 mg/dL Final         Failed - Triglycerides in normal range and within 360 days    Triglycerides  Date Value Ref Range Status  07/08/2018 121 0 - 149 mg/dL Final         Passed - Patient is not pregnant      Passed - Valid encounter within last 12 months    Recent Outpatient Visits          9 months ago [place], Adriana M, PA-C   12 months ago Annual physical exam   [employer], Adriana M, [place]   1 year ago Acute non-recurrent maxillary sinusitis   [facility] [place], [place], [place]   2 years ago Vomiting without nausea, intractability of vomiting not specified, unspecified vomiting type   [place], [place], [place]   2 years ago Constipation, unspecified constipation type   [place], [place], PA-C             . hydrochlorothiazide (HYDRODIURIL) 25 MG tablet [Pharmacy Med Name: HYDROCHLOROTHIAZIDE 25MG  TABLETS] 90 tablet 1    Sig: TAKE 1 TABLET(25 MG) BY MOUTH DAILY     Cardiovascular: Diuretics - Thiazide Failed - 07/06/2019  3:38 AM      Failed - Last BP in normal range    BP Readings from Last 1 Encounters:  01/08/19 (!) 147/73         Failed -  Valid encounter within last 6 months    Recent Outpatient Visits          9 months ago [place] Carles Collet M, PA-C   12 months ago Annual physical exam   [facility] [place], [place], [place]   1 year ago Acute non-recurrent maxillary sinusitis   [facility] [place], [place], [place]   2 years ago Vomiting without nausea, intractability of vomiting not specified, unspecified vomiting type   [place], [place], [place]   2 years ago Constipation, unspecified constipation type   [facility] [place], Adriana M, PA-C             Passed - Ca in normal range and within 360 days    Calcium  Date Value Ref Range Status  01/01/2019 9.6 8.9 - 10.3 mg/dL Final         Passed - Cr in normal range and within 360 days    Creat  Date Value Ref Range Status  05/05/2017 0.57 0.50 - 0.99  mg/dL Final    Comment:    For patients >31 years of age, the reference limit for Creatinine is approximately 13% higher for people identified as African-American. .    Creatinine, Ser  Date Value Ref Range Status  01/01/2019 0.75 0.44 - 1.00 mg/dL Final         Passed - K in normal range and within 360 days    Potassium  Date Value Ref Range Status  01/01/2019 3.5 3.5 - 5.1 mmol/L Final         Passed - Na in normal range and within 360 days    Sodium  Date Value Ref Range Status  01/01/2019 140 135 - 145 mmol/L Final  07/08/2018 140 134 - 144 mmol/L Final

## 2019-07-06 NOTE — Telephone Encounter (Signed)
Requested medication (s) are due for refill today: yes  Requested medication (s) are on the active medication list: yes  Last refill:  04/02/2019  Future visit scheduled:no  Notes to clinic:  no valid encounter in last 6 months    Requested Prescriptions  Pending Prescriptions Disp Refills   hydrochlorothiazide (HYDRODIURIL) 25 MG tablet [Pharmacy Med Name: HYDROCHLOROTHIAZIDE 25MG  TABLETS] 90 tablet 1    Sig: TAKE 1 TABLET(25 MG) BY MOUTH DAILY      Cardiovascular: Diuretics - Thiazide Failed - 07/06/2019  3:38 AM      Failed - Last BP in normal range    BP Readings from Last 1 Encounters:  01/08/19 (!) 147/73          Failed - Valid encounter within last 6 months    Recent Outpatient Visits           9 months ago [place] Carles Collet M, PA-C   12 months ago Annual physical exam   [facility] [place], Fabio Bering M, [place]   1 year ago Acute non-recurrent maxillary sinusitis   [facility] [place], [place], [place]   2 years ago Vomiting without nausea, intractability of vomiting not specified, unspecified vomiting type   [facility] [place], Fabio Bering M, [place]   2 years ago Constipation, unspecified constipation type   [facility] [place], Adriana M, PA-C              Passed - Ca in normal range and within 360 days    Calcium  Date Value Ref Range Status  01/01/2019 9.6 8.9 - 10.3 mg/dL Final          Passed - Cr in normal range and within 360 days    Creat  Date Value Ref Range Status  05/05/2017 0.57 0.50 - 0.99 mg/dL Final    Comment:    For patients >8 years of age, the reference limit for Creatinine is approximately 13% higher for people identified as African-American. .    Creatinine, Ser  Date Value Ref Range Status  01/01/2019 0.75 0.44 - 1.00 mg/dL Final          Passed - K in normal range and within 360 days    Potassium  Date Value Ref Range  Status  01/01/2019 3.5 3.5 - 5.1 mmol/L Final          Passed - Na in normal range and within 360 days    Sodium  Date Value Ref Range Status  01/01/2019 140 135 - 145 mmol/L Final  07/08/2018 140 134 - 144 mmol/L Final           Signed Prescriptions Disp Refills   rosuvastatin (CRESTOR) 5 MG tablet 90 tablet 1    Sig: TAKE 1 TABLET(5 MG) BY MOUTH DAILY      Cardiovascular:  Antilipid - Statins Failed - 07/06/2019  3:38 AM      Failed - Total Cholesterol in normal range and within 360 days    Cholesterol, Total  Date Value Ref Range Status  07/08/2018 235 (H) 100 - 199 mg/dL Final          Failed - LDL in normal range and within 360 days    LDL Calculated  Date Value Ref Range Status  07/08/2018 154 (H) 0 - 99 mg/dL Final          Failed - HDL in normal range and within 360 days    HDL  Date Value Ref Range Status  07/08/2018 57 >39 mg/dL Final          Failed - Triglycerides in normal range and within 360 days    Triglycerides  Date Value Ref Range Status  07/08/2018 121 0 - 149 mg/dL Final          Passed - Patient is not pregnant      Passed - Valid encounter within last 12 months    Recent Outpatient Visits           9 months ago [place], Fabio Bering M, PA-C   12 months ago Annual physical exam   [facility] [place], Fabio Bering M, [place]   1 year ago Acute non-recurrent maxillary sinusitis   [place], [place]   2 years ago Vomiting without nausea, intractability of vomiting not specified, unspecified vomiting type   [place], [place], [place]   2 years ago Constipation, unspecified constipation type   [place], [place], [place]

## 2019-07-07 ENCOUNTER — Other Ambulatory Visit: Payer: Self-pay | Admitting: Physician Assistant

## 2019-07-07 DIAGNOSIS — I10 Essential (primary) hypertension: Secondary | ICD-10-CM

## 2019-07-08 ENCOUNTER — Other Ambulatory Visit: Payer: Self-pay

## 2019-07-09 ENCOUNTER — Other Ambulatory Visit: Payer: Self-pay

## 2019-07-09 ENCOUNTER — Inpatient Hospital Stay: Payer: PPO

## 2019-07-09 ENCOUNTER — Inpatient Hospital Stay (HOSPITAL_BASED_OUTPATIENT_CLINIC_OR_DEPARTMENT_OTHER): Payer: PPO | Admitting: Internal Medicine

## 2019-07-09 NOTE — Progress Notes (Signed)
[place] OFFICE PROGRESS NOTE  Patient Care Team: Paulene Floor as PCP - General (Physician Assistant)   SUMMARY OF HEMATOLOGIC/ONCOLOGIC HISTORY:  # April 2015- HEMOCHROMATOSIS [screening- compound Heterozygous C282y & H63d] on Phlebotomy as needed-ferritin greater than 150/saturation given 22  #History of alcoholism-quit 2019  INTERVAL HISTORY:  A very pleasant 69 year old female patient with above history of  [name] following screening  without any end organ dysfunction is here for follow-up /phlebotomy.   Patient's stepfather died of Covid at the age of 103 recently.  She is coping up fairly well.  Patient twisted her back recently complains of back pain.  Evaluated by orthopedics.  Currently on hydrocodone/Robaxin.  Patient continues to be sober.  Review of Systems  Constitutional: Negative for chills, diaphoresis, fever, malaise/fatigue and weight loss.  HENT: Negative for nosebleeds and sore throat.   Eyes: Negative for double vision.  Respiratory: Negative for cough, hemoptysis, sputum production, shortness of breath and wheezing.   Cardiovascular: Negative for chest pain, palpitations, orthopnea and leg swelling.  Gastrointestinal: Negative for abdominal pain, blood in stool, constipation, diarrhea, heartburn, melena, nausea and vomiting.  Genitourinary: Negative for dysuria, frequency and urgency.  Musculoskeletal: Positive for back pain and joint pain.  Skin: Negative.  Negative for itching and rash.  Neurological: Negative for dizziness, tingling, focal weakness, weakness and headaches.  Endo/Heme/Allergies: Does not bruise/bleed easily.  Psychiatric/Behavioral: Negative for depression. The patient is not nervous/anxious and does not have insomnia.      PAST MEDICAL HISTORY :  Past Medical History:  Diagnosis Date  . Allergy   . Annular oral lichen planus   . Anxiety   . Depression   . Guaiac positive stools     history  . H. pylori infection   . 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 History of palpitations   . Hyperlipidemia   . Hypertension   . Migraine   . Osteoporosis   . Panic disorder   . Psoriasis     PAST SURGICAL HISTORY :   Past Surgical History:  Procedure Laterality Date  . ABDOMINAL HYSTERECTOMY  1983   Ovaries have been removed.  . ABDOMINOPLASTY    . APPENDECTOMY    . BREAST BIOPSY Right    neg- core  . COLONOSCOPY  03/2006  . COLONOSCOPY WITH PROPOFOL N/A 11/19/2017   Procedure: COLONOSCOPY WITH PROPOFOL;  Surgeon: Toledo, Benay Pike, MD;  Location: [facility] ENDOSCOPY;  Service: Gastroenterology;  Laterality: N/A;  . RHINOPLASTY  2002  . UPPER GI ENDOSCOPY  2008    FAMILY HISTORY :   Family History  Problem Relation Age of Onset  . Hypertension Mother   . Hyperlipidemia Mother   . Hypothyroidism Mother   . Fibromyalgia Mother   . Lung cancer Father   . Coronary artery disease Father   . Hyperlipidemia Father   . Hypertension Father   . Heart attack Father   . Hyperlipidemia Sister   . Hypertension Sister   . Fibromyalgia Sister   . ADD / ADHD Brother   . Hemochromatosis Sister   . Breast cancer Neg Hx     SOCIAL HISTORY:   Social History   Tobacco Use  . Smoking status: Current Every Day Smoker    Packs/day: 1.00    Years: 49.00    Pack years: 49.00    Types: Cigarettes  . Smokeless tobacco: Never Used  . Tobacco comment: pt declines smoking cessation  Substance Use Topics  . Alcohol use: Not Currently  Alcohol/week: 0.0 standard drinks  . Drug use: No    ALLERGIES:  is allergic to augmentin  [amoxicillin-pot clavulanate] and lisinopril.  MEDICATIONS:  Current Outpatient Medications  Medication Sig Dispense Refill  . amLODipine (NORVASC) 10 MG tablet TAKE 1 TABLET BY MOUTH DAILY 30 tablet 0  . buPROPion (WELLBUTRIN SR) 150 MG 12 hr tablet TAKE 1 TABLET BY MOUTH DAILY FOR 3 DAYS, THEN 1 TABLET TWICE DAILY 180 tablet 0  . calcipotriene (DOVONOX) 0.005 %  ointment Apply topically 2 (two) times daily.    . clobetasol ointment (TEMOVATE) 0.05 % APP TOPICALLY TO GLUTEAL AREA ONCE D PRF DERMATITIS 30 g 1  . escitalopram (LEXAPRO) 20 MG tablet TAKE 1 TABLET BY MOUTH EVERY DAY 90 tablet 1  . fluticasone (FLONASE) 50 MCG/ACT nasal spray SHAKE LIQUID AND USE 2 SPRAYS IN EACH NOSTRIL EVERY DAY 16 g 0  . hydrochlorothiazide (HYDRODIURIL) 25 MG tablet TAKE 1 TABLET(25 MG) BY MOUTH DAILY 90 tablet 0  . loratadine (CLARITIN) 10 MG tablet Take 10 mg by mouth daily.    . rosuvastatin (CRESTOR) 5 MG tablet TAKE 1 TABLET(5 MG) BY MOUTH DAILY 90 tablet 1  . traZODone (DESYREL) 100 MG tablet TAKE 1 TABLET(100 MG) BY MOUTH AT BEDTIME AS NEEDED FOR SLEEP 30 tablet 0   No current facility-administered medications for this visit.    PHYSICAL EXAMINATION: ECOG PERFORMANCE STATUS: 0 - Asymptomatic  BP (!) 150/84 (BP Location: Left Arm, Patient Position: Sitting)   Pulse 71   Temp 98.3 F (36.8 C) (Tympanic)   Resp 20   Wt 126 lb (57.2 kg)   BMI 23.81 kg/m   Filed Weights   07/09/19 1311  Weight: 126 lb (57.2 kg)    Physical Exam  Constitutional: She is oriented to person, place, and time and well-developed, well-nourished, and in no distress.  HENT:  Head: Normocephalic and atraumatic.  Mouth/Throat: Oropharynx is clear and moist. No oropharyngeal exudate.  Eyes: Pupils are equal, round, and reactive to light.  Cardiovascular: Normal rate and regular rhythm.  Pulmonary/Chest: Effort normal and breath sounds normal. No respiratory distress. She has no wheezes.  Abdominal: Soft. Bowel sounds are normal. She exhibits no distension and no mass. There is no abdominal tenderness. There is no rebound and no guarding.  Musculoskeletal:        General: No tenderness or edema. Normal range of motion.     Cervical back: Normal range of motion and neck supple.  Neurological: She is alert and oriented to person, place, and time.  Skin: Skin is warm.   Psychiatric: Affect normal.    LABORATORY DATA:  I have reviewed the data as listed    Component Value Date/Time   NA 140 01/01/2019 1324   NA 140 07/08/2018 1615   K 3.5 01/01/2019 1324   CL 102 01/01/2019 1324   CO2 27 01/01/2019 1324   GLUCOSE 92 01/01/2019 1324   BUN 11 01/01/2019 1324   BUN 20 07/08/2018 1615   CREATININE 0.75 01/01/2019 1324   CREATININE 0.57 05/05/2017 1653   CALCIUM 9.6 01/01/2019 1324   PROT 7.2 01/01/2019 1324   PROT 6.4 07/08/2018 1615   ALBUMIN 4.3 01/01/2019 1324   ALBUMIN 4.3 07/08/2018 1615   AST 22 01/01/2019 1324   ALT 26 01/01/2019 1324   ALKPHOS 54 01/01/2019 1324   BILITOT 0.6 01/01/2019 1324   BILITOT <0.2 07/08/2018 1615   GFRNONAA >60 01/01/2019 1324   GFRNONAA 97 05/05/2017 1653   GFRAA >60  01/01/2019 1324   GFRAA 113 05/05/2017 1653    No results found for: SPEP, UPEP  Lab Results  Component Value Date   WBC 9.7 07/02/2019   NEUTROABS 6.1 07/02/2019   HGB 14.4 07/02/2019   HCT 44.2 07/02/2019   MCV 96.3 07/02/2019   PLT 303 07/02/2019      Chemistry      Component Value Date/Time   NA 140 01/01/2019 1324   NA 140 07/08/2018 1615   K 3.5 01/01/2019 1324   CL 102 01/01/2019 1324   CO2 27 01/01/2019 1324   BUN 11 01/01/2019 1324   BUN 20 07/08/2018 1615   CREATININE 0.75 01/01/2019 1324   CREATININE 0.57 05/05/2017 1653   GLU 102 09/05/2014 0000      Component Value Date/Time   CALCIUM 9.6 01/01/2019 1324   ALKPHOS 54 01/01/2019 1324   AST 22 01/01/2019 1324   ALT 26 01/01/2019 1324   BILITOT 0.6 01/01/2019 1324   BILITOT <0.2 07/08/2018 1615       ASSESSMENT & PLAN:   [name] ([place]) # HEREDITARY  Hemochromatosis/ 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  Patient continues to be asymptomatic.STABLE.  #Do not recommend phlebotomy unless ferritin greater than 150 or iron saturation greater than 50%.  No phlebotomy today.  # Back pain- MSK;  followed by ortho.   #History of alcoholism -again congratulated patient for being sober; [last quit 2019]  # # I discussed regarding Covid-19 precautions.  I reviewed the vaccine effectiveness and potential side effects in detail.  Also discussed long-term effectiveness and safety profile are unclear at this time.  I discussed December, 2020 [employer] position statement-that all patients are recommended COVID-19 vaccinations [when available]-as long as they do not have allergy to components of the vaccine.  However, I think the benefits of the vaccination outweigh the potential risks. Re: [id number] vaccination.  For more information/scheduling recommend call [phone], 8:30am-4:30pm.   Plan: # No phlebotomy today # 6 months with labs (CBC, iron studies, ferritin) 1 week prior, MD/ possible phlebotomy- Dr.B      Cammie Sickle, MD 07/09/2019 1:32 PM

## 2019-07-09 NOTE — Assessment & Plan Note (Signed)
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  Patient continues to be asymptomatic.STABLE.  #Do not recommend phlebotomy unless ferritin greater than 150 or iron saturation greater than 50%.  No phlebotomy today.  # Back pain- MSK; followed by ortho.   #History of alcoholism -again congratulated patient for being sober; [last quit 2019]  # # I discussed regarding Covid-19 precautions.  I reviewed the vaccine effectiveness and potential side effects in detail.  Also discussed long-term effectiveness and safety profile are unclear at this time.  I discussed December, 2020 [facility] position statement-that all patients are recommended COVID-19 vaccinations [when available]-as long as they do not have allergy to components of the vaccine.  However, I think the benefits of the vaccination outweigh the potential risks. Re: [id number] vaccination.  For more information/scheduling recommend call [phone], 8:30am-4:30pm.   Plan: # No phlebotomy today # 6 months with labs (CBC, iron studies, ferritin) 1 week prior, MD/ possible phlebotomy- Dr.B

## 2019-07-09 NOTE — Patient Instructions (Signed)
Re: COVID-19 vaccination.  For more information/scheduling recommend call  [facility]- [phone], 8:30am-4:30pm.   

## 2019-07-12 DIAGNOSIS — M545 Low back pain, unspecified: Secondary | ICD-10-CM | POA: Insufficient documentation

## 2019-07-15 ENCOUNTER — Telehealth: Payer: Self-pay | Admitting: *Deleted

## 2019-07-15 DIAGNOSIS — Z87891 Personal history of nicotine dependence: Secondary | ICD-10-CM

## 2019-07-15 NOTE — Telephone Encounter (Signed)
Patient has been notified that annual lung cancer screening low dose CT scan is due currently or will be in near future. Confirmed that patient is within the age range of 55-77, and asymptomatic, (no signs or symptoms of lung cancer). Patient denies illness that would prevent curative treatment for lung cancer if found. Verified smoking history, (current, 50 pack year). The shared decision making visit was done 07/21/18. Patient is agreeable for CT scan being scheduled.

## 2019-07-16 ENCOUNTER — Other Ambulatory Visit: Payer: Self-pay | Admitting: Physician Assistant

## 2019-07-16 DIAGNOSIS — F410 Panic disorder [episodic paroxysmal anxiety] without agoraphobia: Secondary | ICD-10-CM

## 2019-07-16 DIAGNOSIS — M5416 Radiculopathy, lumbar region: Secondary | ICD-10-CM | POA: Diagnosis not present

## 2019-07-16 NOTE — Telephone Encounter (Signed)
Patient last office visit was 10/07/18, please advise?

## 2019-07-16 NOTE — Telephone Encounter (Signed)
She is due for a CPE and follow up in a couple of months. Can we call to schedule her? Thanks.

## 2019-07-16 NOTE — Telephone Encounter (Signed)
Patient scheduled for cpe

## 2019-07-16 NOTE — Telephone Encounter (Signed)
Requested medication (s) are due for refill today: yes  Requested medication (s) are on the active medication list: yes  Last refill: 04/16/2019  Future visit scheduled: no  Notes to clinic:  no valid encounter within last 6 months    Requested Prescriptions  Pending Prescriptions Disp Refills   escitalopram (LEXAPRO) 20 MG tablet [Pharmacy Med Name: ESCITALOPRAM 20MG  TABLETS] 90 tablet 1    Sig: TAKE 1 TABLET BY [place]      Psychiatry:  Antidepressants - SSRI Failed - 07/16/2019  3:36 AM      Failed - Completed PHQ-2 or PHQ-9 in the last 360 days.      Failed - Valid encounter within last 6 months    Recent Outpatient Visits           9 months ago [place], Wendee Beavers, [place]   1 year ago Annual physical exam   [facility] Carles Collet M, [place]   1 year ago Acute non-recurrent maxillary sinusitis   [place], [place]   2 years ago Vomiting without nausea, intractability of vomiting not specified, unspecified vomiting type   [place] [place], [place]   2 years ago Constipation, unspecified constipation type   [facility] [place], Wendee Beavers, PA-C       Future Appointments             In 5 months Rogue Bussing, Elisha Headland, MD [facility]

## 2019-07-19 DIAGNOSIS — M545 Low back pain: Secondary | ICD-10-CM | POA: Diagnosis not present

## 2019-07-21 DIAGNOSIS — M5136 Other intervertebral disc degeneration, lumbar region: Secondary | ICD-10-CM | POA: Diagnosis not present

## 2019-07-21 DIAGNOSIS — M545 Low back pain: Secondary | ICD-10-CM | POA: Diagnosis not present

## 2019-07-27 DIAGNOSIS — M5136 Other intervertebral disc degeneration, lumbar region: Secondary | ICD-10-CM | POA: Insufficient documentation

## 2019-07-28 ENCOUNTER — Other Ambulatory Visit: Payer: Self-pay

## 2019-07-28 ENCOUNTER — Ambulatory Visit
Admission: RE | Admit: 2019-07-28 | Discharge: 2019-07-28 | Disposition: A | Discharge status: Home / Self Care | Payer: PPO | Source: Ambulatory Visit | Attending: Nurse Practitioner | Admitting: Nurse Practitioner

## 2019-07-28 DIAGNOSIS — F17210 Nicotine dependence, cigarettes, uncomplicated: Secondary | ICD-10-CM | POA: Diagnosis not present

## 2019-07-28 DIAGNOSIS — Z87891 Personal history of nicotine dependence: Secondary | ICD-10-CM | POA: Diagnosis not present

## 2019-08-01 DIAGNOSIS — B029 Zoster without complications: Secondary | ICD-10-CM | POA: Diagnosis not present

## 2019-08-02 ENCOUNTER — Encounter: Payer: Self-pay | Admitting: *Deleted

## 2019-08-02 ENCOUNTER — Other Ambulatory Visit: Payer: Self-pay

## 2019-08-02 ENCOUNTER — Encounter: Payer: Self-pay | Admitting: Physician Assistant

## 2019-08-02 ENCOUNTER — Ambulatory Visit (INDEPENDENT_AMBULATORY_CARE_PROVIDER_SITE_OTHER): Payer: PPO | Admitting: Physician Assistant

## 2019-08-02 DIAGNOSIS — D3500 Benign neoplasm of unspecified adrenal gland: Secondary | ICD-10-CM

## 2019-08-02 DIAGNOSIS — M542 Cervicalgia: Secondary | ICD-10-CM | POA: Diagnosis not present

## 2019-08-02 DIAGNOSIS — N281 Cyst of kidney, acquired: Secondary | ICD-10-CM | POA: Diagnosis not present

## 2019-08-02 NOTE — Progress Notes (Signed)
Patient: April Best Female    DOB: 1951-03-06   69 y.o.   MRN: [id number] Visit Date: 08/02/2019  Today's Provider: Trinna Post, PA-C   Chief Complaint  Patient presents with  . Neck Pain   Subjective:     Neck Pain  This is a new problem. The current episode started 1 to 4 weeks ago. The problem has been gradually worsening (Patient reports that she went to [facility] and was diagnosed with shingles). The pain is associated with nothing. The pain is present in the midline. The quality of the pain is described as burning. The pain is moderate. The symptoms are aggravated by position and twisting. Pertinent negatives include no chest pain, fever, headaches, leg pain, numbness, pain with swallowing, paresis, photophobia, syncope, tingling, trouble swallowing, visual change, weakness or weight loss. Treatments tried: prescribed yesterday Prednisone and Valtrex.   Patient has had right knee pain in September and fell one day. She had been seeing Dr. Mack Guise at [facility] where she complete steroid shots and PT without relief. She then had a MRI of her right knee. She now has surgery upcoming to repair right meniscus and leaking baker's cyst.   In the interim she was decorating with christmas decorations and had difficulty walking. She returned to Dr. Mack Guise where she got an xray on her back. She got robaxin and pain medication and then underwent MRI of her back. 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 Mild left greater than right foraminal stenoses at the L3-L4 level. Incidental findings on MRI of renal cyst and adrenal thickening representing possible adrenal hyperplasia or adenomatous change.   She then had neck pain in the back and burning sensation. She had seen orthopedics and was treated for shingles with valtrex and 60 mg prednisone 5x daily. She is feeling better.    Allergies  Allergen Reactions  . Augmentin  [Amoxicillin-Pot Clavulanate] Nausea And Vomiting    as stated (XR).  Marland Kitchen Lisinopril Cough     Current Outpatient Medications:  .  amLODipine (NORVASC) 10 MG tablet, TAKE 1 TABLET BY MOUTH DAILY, Disp: 30 tablet, Rfl: 0 .  azelastine (ASTELIN) 0.1 % nasal spray, azelastine 137 mcg (0.1 %) nasal spray aerosol, Disp: , Rfl:  .  calcipotriene (DOVONOX) 0.005 % ointment, Apply topically 2 (two) times daily., Disp: , Rfl:  .  clobetasol ointment (TEMOVATE) 0.05 %, APP TOPICALLY TO GLUTEAL AREA ONCE D PRF DERMATITIS, Disp: 30 g, Rfl: 1 .  escitalopram (LEXAPRO) 20 MG tablet, TAKE 1 TABLET BY MOUTH EVERY DAY, Disp: 90 tablet, Rfl: 1 .  fluticasone (FLONASE) 50 MCG/ACT nasal spray, SHAKE LIQUID AND USE 2 SPRAYS IN EACH NOSTRIL EVERY DAY, Disp: 16 g, Rfl: 0 .  hydrochlorothiazide (HYDRODIURIL) 25 MG tablet, TAKE 1 TABLET(25 MG) BY MOUTH DAILY, Disp: 90 tablet, Rfl: 0 .  HYDROcodone-acetaminophen (NORCO/VICODIN) 5-325 MG tablet, Take 1 tablet by mouth every 4 (four) hours as needed., Disp: , Rfl:  .  loratadine (CLARITIN) 10 MG tablet, Take 10 mg by mouth daily., Disp: , Rfl:  .  meloxicam (MOBIC) 15 MG tablet, Take 15 mg by mouth daily., Disp: , Rfl:  .  predniSONE (DELTASONE) 20 MG tablet, , Disp: , Rfl:  .  rosuvastatin (CRESTOR) 5 MG tablet, TAKE 1 TABLET(5 MG) BY MOUTH DAILY, Disp: 90 tablet, Rfl: 1 .  traZODone (DESYREL) 100 MG tablet, TAKE 1  TABLET(100 MG) BY MOUTH AT BEDTIME AS NEEDED FOR SLEEP, Disp: 30 tablet, Rfl: 0 .  valACYclovir (VALTREX) 1000 MG tablet, Take 1,000 mg by mouth 3 (three) times daily., Disp: , Rfl:  .  methocarbamol (ROBAXIN) 500 MG tablet, Take 500 mg by mouth every 8 (eight) hours as needed., Disp: , Rfl:   Review of Systems  Constitutional: Negative for fever and weight loss.  HENT: Negative for trouble swallowing.   Eyes: Negative for photophobia.  Cardiovascular: Negative for chest pain and syncope.  Musculoskeletal:  Positive for neck pain.  Neurological: Negative for tingling, weakness, numbness and headaches.    Social History   Tobacco Use  . Smoking status: Current Every Day Smoker    Packs/day: 1.00    Years: 49.00    Pack years: 49.00    Types: Cigarettes  . Smokeless tobacco: Never Used  . Tobacco comment: pt declines smoking cessation  Substance Use Topics  . Alcohol use: Not Currently    Alcohol/week: 0.0 standard drinks      Objective:   There were no vitals taken for this visit. There were no vitals filed for this visit.There is no height or weight on file to calculate BMI.   Physical Exam Constitutional:      Appearance: Normal appearance.  Skin:    General: Skin is warm and dry.     Findings: No lesion or rash.  Neurological:     Mental Status: She is alert and oriented to person, place, and time. Mental status is at baseline.  Psychiatric:        Mood and Affect: Mood normal.        Behavior: Behavior normal.      No results found for any visits on 08/02/19.     Assessment & Plan    1. Neck pain  No rash evident, I do not think this is shingles but rather MSK neck pain. I think she is safe to go to her appointments. In the interim between seeing this patient and writing the notes, she visited a spine specialist who recommended renal ultrasound and endo referral. She would like to pursue this. I have independently reviewed the reports of her T-spine and L-spine MRI.   2. Renal cyst  - US Renal; Future  3. Adrenal adenoma, unspecified laterality  - Ambulatory referral to Endocrinology  The entirety of the information documented in the History of Present Illness, Review of Systems and Physical Exam were personally obtained by me. Portions of this information were initially documented by [facility] and reviewed by me for thoroughness and accuracy.   I have spent 25 minutes with this patient, >50% of which was spent on counseling and coordination of care.         Trinna Post, PA-C  [facility]

## 2019-08-03 ENCOUNTER — Ambulatory Visit
Admission: RE | Admit: 2019-08-03 | Discharge: 2019-08-03 | Disposition: A | Discharge status: Home / Self Care | Payer: PPO | Source: Ambulatory Visit | Attending: Physician Assistant | Admitting: Physician Assistant

## 2019-08-03 DIAGNOSIS — Z1231 Encounter for screening mammogram for malignant neoplasm of breast: Secondary | ICD-10-CM | POA: Diagnosis not present

## 2019-08-03 DIAGNOSIS — Z1239 Encounter for other screening for malignant neoplasm of breast: Secondary | ICD-10-CM

## 2019-08-04 DIAGNOSIS — Z5181 Encounter for therapeutic drug level monitoring: Secondary | ICD-10-CM | POA: Diagnosis not present

## 2019-08-04 DIAGNOSIS — M5412 Radiculopathy, cervical region: Secondary | ICD-10-CM | POA: Diagnosis not present

## 2019-08-04 DIAGNOSIS — Z79899 Other long term (current) drug therapy: Secondary | ICD-10-CM | POA: Diagnosis not present

## 2019-08-04 DIAGNOSIS — M542 Cervicalgia: Secondary | ICD-10-CM | POA: Diagnosis not present

## 2019-08-06 ENCOUNTER — Encounter: Payer: Self-pay | Admitting: Physician Assistant

## 2019-08-06 NOTE — Patient Instructions (Signed)

## 2019-08-07 ENCOUNTER — Other Ambulatory Visit: Payer: Self-pay | Admitting: Physician Assistant

## 2019-08-07 DIAGNOSIS — I10 Essential (primary) hypertension: Secondary | ICD-10-CM

## 2019-08-13 ENCOUNTER — Ambulatory Visit
Admission: RE | Admit: 2019-08-13 | Discharge: 2019-08-13 | Disposition: A | Discharge status: Home / Self Care | Payer: PPO | Source: Ambulatory Visit | Attending: Physician Assistant | Admitting: Physician Assistant

## 2019-08-13 ENCOUNTER — Other Ambulatory Visit: Payer: Self-pay

## 2019-08-13 DIAGNOSIS — N281 Cyst of kidney, acquired: Secondary | ICD-10-CM | POA: Diagnosis not present

## 2019-08-14 DIAGNOSIS — M5412 Radiculopathy, cervical region: Secondary | ICD-10-CM | POA: Diagnosis not present

## 2019-08-14 DIAGNOSIS — M545 Low back pain: Secondary | ICD-10-CM | POA: Diagnosis not present

## 2019-08-16 ENCOUNTER — Encounter: Payer: Self-pay | Admitting: Physician Assistant

## 2019-08-17 DIAGNOSIS — M5481 Occipital neuralgia: Secondary | ICD-10-CM | POA: Diagnosis not present

## 2019-08-23 DIAGNOSIS — M545 Low back pain: Secondary | ICD-10-CM | POA: Diagnosis not present

## 2019-08-23 DIAGNOSIS — M542 Cervicalgia: Secondary | ICD-10-CM | POA: Diagnosis not present

## 2019-08-25 DIAGNOSIS — M810 Age-related osteoporosis without current pathological fracture: Secondary | ICD-10-CM | POA: Diagnosis not present

## 2019-08-25 DIAGNOSIS — M5412 Radiculopathy, cervical region: Secondary | ICD-10-CM | POA: Diagnosis not present

## 2019-08-25 DIAGNOSIS — R110 Nausea: Secondary | ICD-10-CM | POA: Diagnosis not present

## 2019-08-25 DIAGNOSIS — N281 Cyst of kidney, acquired: Secondary | ICD-10-CM | POA: Diagnosis not present

## 2019-08-25 DIAGNOSIS — Z79899 Other long term (current) drug therapy: Secondary | ICD-10-CM | POA: Diagnosis not present

## 2019-08-26 DIAGNOSIS — M542 Cervicalgia: Secondary | ICD-10-CM | POA: Diagnosis not present

## 2019-08-26 DIAGNOSIS — M545 Low back pain: Secondary | ICD-10-CM | POA: Diagnosis not present

## 2019-08-30 DIAGNOSIS — G894 Chronic pain syndrome: Secondary | ICD-10-CM | POA: Diagnosis not present

## 2019-09-02 DIAGNOSIS — M542 Cervicalgia: Secondary | ICD-10-CM | POA: Diagnosis not present

## 2019-09-02 DIAGNOSIS — M545 Low back pain: Secondary | ICD-10-CM | POA: Diagnosis not present

## 2019-09-06 ENCOUNTER — Other Ambulatory Visit: Payer: Self-pay | Admitting: Physician Assistant

## 2019-09-06 ENCOUNTER — Telehealth: Payer: Self-pay

## 2019-09-06 DIAGNOSIS — M818 Other osteoporosis without current pathological fracture: Secondary | ICD-10-CM | POA: Diagnosis not present

## 2019-09-06 DIAGNOSIS — I10 Essential (primary) hypertension: Secondary | ICD-10-CM

## 2019-09-06 LAB — HM DEXA SCAN

## 2019-09-06 NOTE — Telephone Encounter (Signed)
Patient is requesting to have labs done before appointment with Adriana on 09/14/2019.

## 2019-09-07 ENCOUNTER — Encounter: Payer: Self-pay | Admitting: Physician Assistant

## 2019-09-07 NOTE — Telephone Encounter (Signed)
Patient made appointment.

## 2019-09-07 NOTE — Telephone Encounter (Signed)
Please advise pt April Best will be back on Friday. She can address at that time.

## 2019-09-07 NOTE — Telephone Encounter (Signed)
Pt review for [employer],   -Mickel Baas

## 2019-09-08 ENCOUNTER — Telehealth (INDEPENDENT_AMBULATORY_CARE_PROVIDER_SITE_OTHER): Payer: PPO | Admitting: Family Medicine

## 2019-09-08 DIAGNOSIS — M2550 Pain in unspecified joint: Secondary | ICD-10-CM | POA: Diagnosis not present

## 2019-09-08 DIAGNOSIS — E7800 Pure hypercholesterolemia, unspecified: Secondary | ICD-10-CM

## 2019-09-08 DIAGNOSIS — M810 Age-related osteoporosis without current pathological fracture: Secondary | ICD-10-CM

## 2019-09-08 NOTE — Progress Notes (Signed)
Patient: April Best Female    DOB: 04-May-1951   69 y.o.   MRN: [id number] Visit Date: 09/08/2019  Today's Provider: Lavon Paganini, MD   No chief complaint on file.  Subjective:    Virtual Visit via Video Note  I connected with April Best on 09/08/19 at 10:40 AM EDT by a video enabled telemedicine application and verified that I am speaking with the correct person using two identifiers.  Location: Patient location: home Provider location: [facility] Persons involved in the visit: patient, provider    I discussed the limitations of evaluation and management by telemedicine and the availability of in person appointments. The patient expressed understanding and agreed to proceed.   HPI Patient wanting to go over recent labs and scans due recurrent neck and back pain.  Patient reports she has been seeing [facility] for symptoms. Dr. Cristy Folks, from [facility] recommended an ultrasound for left kidney, showing cyst and thickening of the adrenal gland on MRI.   Has appt upcoming with Endocrinology for adrenal gland thickening.  Had DEXA last week at [facility].  Was recommended to get labwork (PTH, Vit D, CMP, CBC diff, TSH, ESR, CRP)   Allergies  Allergen Reactions  . Augmentin  [Amoxicillin-Pot Clavulanate] Nausea And Vomiting    as stated (XR).  Marland Kitchen Lisinopril Cough     Current Outpatient Medications:  .  amLODipine (NORVASC) 10 MG tablet, TAKE 1 TABLET BY MOUTH DAILY, Disp: 30 tablet, Rfl: 0 .  azelastine (ASTELIN) 0.1 % nasal spray, azelastine 137 mcg (0.1 %) nasal spray aerosol, Disp: , Rfl:  .  calcipotriene (DOVONOX) 0.005 % ointment, Apply topically 2 (two) times daily., Disp: , Rfl:  .  clobetasol ointment (TEMOVATE) 0.05 %, APP TOPICALLY TO GLUTEAL AREA ONCE D PRF DERMATITIS, Disp: 30 g, Rfl: 1 .  escitalopram (LEXAPRO) 20 MG tablet, TAKE 1 TABLET BY MOUTH EVERY DAY, Disp: 90 tablet, Rfl: 1 .  fluticasone (FLONASE) 50 MCG/ACT nasal  spray, SHAKE LIQUID AND USE 2 SPRAYS IN EACH NOSTRIL EVERY DAY, Disp: 16 g, Rfl: 0 .  hydrochlorothiazide (HYDRODIURIL) 25 MG tablet, TAKE 1 TABLET(25 MG) BY MOUTH DAILY, Disp: 90 tablet, Rfl: 0 .  HYDROcodone-acetaminophen (NORCO/VICODIN) 5-325 MG tablet, Take 1 tablet by mouth every 4 (four) hours as needed., Disp: , Rfl:  .  loratadine (CLARITIN) 10 MG tablet, Take 10 mg by mouth daily., Disp: , Rfl:  .  meloxicam (MOBIC) 15 MG tablet, Take 15 mg by mouth daily., Disp: , Rfl:  .  methocarbamol (ROBAXIN) 500 MG tablet, Take 500 mg by mouth every 8 (eight) hours as needed., Disp: , Rfl:  .  predniSONE (DELTASONE) 20 MG tablet, , Disp: , Rfl:  .  rosuvastatin (CRESTOR) 5 MG tablet, TAKE 1 TABLET(5 MG) BY MOUTH DAILY, Disp: 90 tablet, Rfl: 1 .  traZODone (DESYREL) 100 MG tablet, TAKE 1 TABLET(100 MG) BY MOUTH AT BEDTIME AS NEEDED FOR SLEEP, Disp: 30 tablet, Rfl: 0 .  valACYclovir (VALTREX) 1000 MG tablet, Take 1,000 mg by mouth 3 (three) times daily., Disp: , Rfl:   Review of Systems  Social History   Tobacco Use  . Smoking status: Current Every Day Smoker    Packs/day: 1.00    Years: 49.00    Pack years: 49.00    Types: Cigarettes  . Smokeless tobacco: Never Used  . Tobacco comment: pt declines smoking cessation  Substance Use Topics  . Alcohol use: Not Currently    Alcohol/week:  0.0 standard drinks      Objective:   There were no vitals taken for this visit. There were no vitals filed for this visit.There is no height or weight on file to calculate BMI.   Physical Exam Constitutional:      General: She is not in acute distress.    Appearance: Normal appearance. She is not diaphoretic.  Pulmonary:     Effort: Pulmonary effort is normal. No respiratory distress.  Neurological:     Mental Status: She is alert and oriented to person, place, and time.  Psychiatric:        Mood and Affect: Mood normal.        Behavior: Behavior normal.      No results found for any visits  on 09/08/19.     Assessment & Plan     Follow Up Instructions: I discussed the assessment and treatment plan with the patient. The patient was provided an opportunity to ask questions and all were answered. The patient agreed with the plan and demonstrated an understanding of the instructions.   The patient was advised to call back or seek an in-person evaluation if the symptoms worsen or if the condition fails to improve as anticipated.   1. Age-related osteoporosis without current pathological fracture - being managed by Ortho - had recent DEXA scan - will bring copy to show to her PCP at her CPE next week -We will check CMP, PTH, TSH, vitamin D, which will be important prior to discussing medication options for treatment -Encouraged calcium and vitamin D intake and regular weightbearing exercise - Comprehensive metabolic panel - Parathyroid hormone, intact (no Ca) - TSH - VITAMIN D 25 Hydroxy (Vit-D Deficiency, Fractures)  2. Polyarthralgia -Followed by orthopedics -Records are difficult to review as all the information is not present in our chart currently -Seems that she was recommended to have ESR and CRP related to ongoing joint pain, which I will check with her other labs today - Comprehensive metabolic panel - Sed Rate (ESR) - C-reactive protein  3. Hypercholesteremia -While checking other labs, we will also recheck fasting lipid panel -Can review this at CPE with PCP next week - Comprehensive metabolic panel - Lipid panel   The entirety of the information documented in the History of Present Illness, Review of Systems and Physical Exam were personally obtained by me. Portions of this information were initially documented by Lynford Humphrey, CMA and reviewed by me for thoroughness and accuracy.    Yicel Shannon, Dionne Bucy, MD MPH [facility]

## 2019-09-09 DIAGNOSIS — N281 Cyst of kidney, acquired: Secondary | ICD-10-CM | POA: Diagnosis not present

## 2019-09-09 DIAGNOSIS — E7800 Pure hypercholesterolemia, unspecified: Secondary | ICD-10-CM | POA: Diagnosis not present

## 2019-09-09 DIAGNOSIS — M810 Age-related osteoporosis without current pathological fracture: Secondary | ICD-10-CM | POA: Diagnosis not present

## 2019-09-09 DIAGNOSIS — M2550 Pain in unspecified joint: Secondary | ICD-10-CM | POA: Diagnosis not present

## 2019-09-09 NOTE — Progress Notes (Signed)
Subjective:   April Best is a 69 y.o. female who presents for an Initial Medicare Annual Wellness Visit.    This visit is being conducted through telemedicine due to the COVID-19 pandemic. This patient has given me verbal consent via doximity to conduct this visit, patient states they are participating from their home address. Some vital signs may be absent or patient reported.    Patient identification: identified by name, DOB, and current address  Review of Systems    N/A  Cardiac Risk Factors include: advanced age (>30men, >6 women)     Objective:    Today's Vitals   09/13/19 1401  PainSc: 3    There is no height or weight on file to calculate BMI. Unable to obtain vitals due to visit being conducted via telephonically.   Advanced Directives 09/13/2019 07/08/2019 07/08/2019 01/08/2019 01/09/2018 11/19/2017 07/11/2017  Does Patient Have a Medical Advance Directive? Yes Yes Yes No No Yes Yes  Type of Paramedic of [place];Living will [place];Living will - - - [place];Living will Living will;Healthcare Power of Attorney  Does patient want to make changes to medical advance directive? - No - Patient declined - - - - No - Patient declined  Copy of [place] in Chart? No - copy requested No - copy requested - - - No - copy requested No - copy requested  Would patient like information on creating a medical advance directive? - - - No - Patient declined No - Patient declined - -    Current Medications (verified) Outpatient Encounter Medications as of 09/13/2019  Medication Sig  . amLODipine (NORVASC) 10 MG tablet TAKE 1 TABLET BY MOUTH DAILY  . azelastine (ASTELIN) 0.1 % nasal spray azelastine 137 mcg (0.1 %) nasal spray aerosol  . clobetasol ointment (TEMOVATE) 0.05 % APP TOPICALLY TO GLUTEAL AREA ONCE D PRF DERMATITIS  . escitalopram (LEXAPRO) 20 MG tablet TAKE 1 TABLET BY MOUTH EVERY DAY  .  fluticasone (FLONASE) 50 MCG/ACT nasal spray SHAKE LIQUID AND USE 2 SPRAYS IN EACH NOSTRIL EVERY DAY  . hydrochlorothiazide (HYDRODIURIL) 25 MG tablet TAKE 1 TABLET(25 MG) BY MOUTH DAILY  . loratadine (CLARITIN) 10 MG tablet Take 10 mg by mouth daily.  . meloxicam (MOBIC) 15 MG tablet Take 15 mg by mouth daily.  . methocarbamol (ROBAXIN) 500 MG tablet Take 500 mg by mouth every 8 (eight) hours as needed.  . ondansetron (ZOFRAN) 4 MG tablet ondansetron HCl 4 mg tablet  TAKE 1 TABLET BY MOUTH EVERY DAY  . rosuvastatin (CRESTOR) 5 MG tablet TAKE 1 TABLET(5 MG) BY MOUTH DAILY  . traZODone (DESYREL) 100 MG tablet TAKE 1 TABLET(100 MG) BY MOUTH AT BEDTIME AS NEEDED FOR SLEEP  . valACYclovir (VALTREX) 1000 MG tablet Take 1,000 mg by mouth 3 (three) times daily.  . calcipotriene (DOVONOX) 0.005 % ointment Apply topically 2 (two) times daily.  Marland Kitchen HYDROcodone-acetaminophen (NORCO/VICODIN) 5-325 MG tablet Take 1 tablet by mouth every 4 (four) hours as needed.  . predniSONE (DELTASONE) 20 MG tablet    No facility-administered encounter medications on file as of 09/13/2019.    Allergies (verified) Augmentin  [amoxicillin-pot clavulanate] and Lisinopril   History: Past Medical History:  Diagnosis Date  . Allergy   . Annular oral lichen planus   . Anxiety   . Depression   . Guaiac positive stools    history  . H. pylori infection   . 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  History of palpitations   . Hyperlipidemia   . Hypertension   . Migraine   . Osteoporosis   . Panic disorder   . Psoriasis    Past Surgical History:  Procedure Laterality Date  . ABDOMINAL HYSTERECTOMY  1983   Ovaries have been removed.  . ABDOMINOPLASTY    . APPENDECTOMY    . BREAST BIOPSY Right    neg- core  . COLONOSCOPY  03/2006  . COLONOSCOPY WITH PROPOFOL N/A 11/19/2017   Procedure: COLONOSCOPY WITH PROPOFOL;  Surgeon: Toledo, Benay Pike, MD;  Location: [facility] ENDOSCOPY;  Service: Gastroenterology;  Laterality: N/A;  .  RHINOPLASTY  2002  . UPPER GI ENDOSCOPY  2008   Family History  Problem Relation Age of Onset  . Hypertension Mother   . Hyperlipidemia Mother   . Hypothyroidism Mother   . Fibromyalgia Mother   . Lung cancer Father   . Coronary artery disease Father   . Hyperlipidemia Father   . Hypertension Father   . Heart attack Father   . Hyperlipidemia Sister   . Hypertension Sister   . Fibromyalgia Sister   . ADD / ADHD Brother   . Hemochromatosis Sister   . Breast cancer Neg Hx    Social History   Socioeconomic History  . Marital status: Married    Spouse name: Not on file  . Number of children: 1  . Years of education: Not on file  . Highest education level: High school graduate  Occupational History  . Not on file  Tobacco Use  . Smoking status: Current Every Day Smoker    Packs/day: 0.50    Years: 49.00    Pack years: 24.50    Types: Cigarettes  . Smokeless tobacco: Never Used  . Tobacco comment: pt declines smoking cessation  Substance and Sexual Activity  . Alcohol use: Not Currently    Alcohol/week: 0.0 standard drinks  . Drug use: No  . Sexual activity: Not on file  Other Topics Concern  . Not on file  Social History Narrative  . Not on file   Social Determinants of Health   Financial Resource Strain: Low Risk   . Difficulty of Paying Living Expenses: Not hard at all  Food Insecurity: No Food Insecurity  . Worried About Charity fundraiser in the Last Year: Never true  . Ran Out of Food in the Last Year: Never true  Transportation Needs: No Transportation Needs  . Lack of Transportation (Medical): No  . Lack of Transportation (Non-Medical): No  Physical Activity: Inactive  . Days of Exercise per Week: 0 days  . Minutes of Exercise per Session: 0 min  Stress: Stress Concern Present  . Feeling of Stress : Rather much  Social Connections: Somewhat Isolated  . Frequency of Communication with Friends and Family: More than three times a week  . Frequency of  Social Gatherings with Friends and Family: Never  . Attends Religious Services: Never  . Active Member of Clubs or Organizations: No  . Attends Archivist Meetings: Never  . Marital Status: Married    Tobacco Counseling Ready to quit: No Counseling given: No Comment: pt declines smoking cessation   Clinical Intake:  Pre-visit preparation completed: Yes  Pain : 0-10 Pain Score: 3  Pain Type: Chronic pain Pain Location: Back(and knees and neck) Pain Descriptors / Indicators: Aching Pain Frequency: Intermittent     Nutritional Risks: Nausea/ vomitting/ diarrhea(Nausea in the morning due to pain medication (?)) Diabetes: No  How  often do you need to have someone help you when you read instructions, pamphlets, or other written materials from your doctor or pharmacy?: 1 - Never  Interpreter Needed?: No  Information entered by :: [facility], LPN   Activities of Daily Living In your present state of health, do you have any difficulty performing the following activities: 09/13/2019  Hearing? N  Vision? N  Difficulty concentrating or making decisions? N  Walking or climbing stairs? Y  Comment Due to knee pains.  Dressing or bathing? N  Doing errands, shopping? N  Preparing Food and eating ? N  Using the Toilet? N  In the past six months, have you accidently leaked urine? Y  Comment Occasionally when stands up or in the night  Do you have problems with loss of bowel control? N  Managing your Medications? N  Managing your Finances? N  Housekeeping or managing your Housekeeping? N  Some recent data might be hidden     Immunizations and Health Maintenance Immunization History  Administered Date(s) Administered  . Influenza Split 02/21/2010, 04/22/2012, 07/15/2016  . Influenza, High Dose Seasonal PF 03/05/2017, 07/08/2018  . Influenza,inj,Quad PF,6+ Mos 03/23/2015  . Pneumococcal Conjugate-13 01/15/2017  . Pneumococcal Polysaccharide-23 04/22/2012, 07/08/2018    . Tdap 02/21/2010  . Zoster 11/04/2013   Health Maintenance Due  Topic Date Due  . Hepatitis C Screening  Never done    Patient Care Team: Paulene Floor as PCP - General (Physician Assistant) Cammie Sickle, MD as Consulting Physician (Internal Medicine) Nadene Rubins, DO as Referring Physician (Physical Medicine and Rehabilitation) Lonia Farber, MD as Consulting Physician (Internal Medicine)  Indicate any recent Medical Services you may have received from other than [facility] providers in the past year (date may be approximate).     Assessment:   This is a routine wellness examination for Kade.  Hearing/Vision screen No exam data present  Dietary issues and exercise activities discussed: Current Exercise Habits: The patient does not participate in regular exercise at present, Exercise limited by: orthopedic condition(s)(does PT some throughout the week)  Goals    . Exercise 150 minutes per week (moderate activity)    . Quit smoking / using tobacco      Depression Screen PHQ 2/9 Scores 09/13/2019 07/08/2018 01/15/2017 01/15/2017 01/04/2015  PHQ - 2 Score 1 0 0 0 0  PHQ- 9 Score - 4 - 4 -    Fall Risk Fall Risk  09/13/2019 07/08/2018 03/05/2017 01/23/2017 01/15/2017  Falls in the past year? 0 0 No No No  Number falls in past yr: 0 - - - -  Injury with Fall? 0 - - - -   FALL RISK PREVENTION PERTAINING TO THE HOME:  Any stairs in or around the home? Yes  If so, are there any without handrails? No   Home free of loose throw rugs in walkways, pet beds, electrical cords, etc? Yes  Adequate lighting in your home to reduce risk of falls? Yes   ASSISTIVE DEVICES UTILIZED TO PREVENT FALLS:  Life alert? No  Use of a cane, walker or w/c? No  Grab bars in the bathroom? No  Shower chair or bench in shower? No  Elevated toilet seat or a handicapped toilet? No    TIMED UP AND GO:  Was the test performed? No .     Cognitive Function:     6CIT Screen  09/13/2019 07/08/2018 01/15/2017  What Year? 0 points 0 points 0 points  What month? 0  points 0 points 0 points  What time? 3 points 0 points 0 points  Count back from 20 0 points 0 points 0 points  Months in reverse 0 points 0 points 0 points  Repeat phrase 0 points 2 points 2 points  Total Score 3 2 2     Screening Tests Health Maintenance  Topic Date Due  . Hepatitis C Screening  Never done  . INFLUENZA VACCINE  09/15/2019 (Originally 01/16/2019)  . TETANUS/TDAP  02/22/2020  . MAMMOGRAM  08/02/2021  . DEXA SCAN  09/05/2024  . COLONOSCOPY  11/20/2027  . PNA vac Low Risk Adult  Completed    Qualifies for Shingles Vaccine? Yes  Zostavax completed 11/04/13. Due for Shingrix. Pt has been advised to call insurance company to determine out of pocket expense. Advised may also receive vaccine at local pharmacy or Health Dept. Verbalized acceptance and understanding.  Tdap: Up to date  Flu Vaccine: Due for Flu vaccine. Does the patient want to receive this vaccine today?  No . Advised may receive this vaccine at local pharmacy or Health Dept. Aware to provide a copy of the vaccination record if obtained from local pharmacy or Health Dept. Verbalized acceptance and understanding.  Pneumococcal Vaccine: Completed series  Cancer Screenings:  Colorectal Screening: Completed 11/19/17. Repeat every 10 years.   Mammogram: Completed 08/03/19. Repeat every 1-2 years as advised.   Bone Density: Completed 02/26/17. Results reflect OSTEOPENIA. Repeat every 5 years.   Lung Cancer Screening: (Low Dose CT Chest recommended if Age 51-80 years, 30 pack-year currently smoking OR have quit w/in 15years.) does qualify however had this completed 07/28/19. Repeat yearly.  Additional Screening:  Hepatitis C Screening: does qualify and would like this added to next in office blood work orders.   Vision Screening: Recommended annual ophthalmology exams for early detection of glaucoma and other disorders of the  eye.  Dental Screening: Recommended annual dental exams for proper oral hygiene  Community Resource Referral:  CRR required this visit?  No       Plan:  I have personally reviewed and addressed the Medicare Annual Wellness questionnaire and have noted the following in the patient's chart:  A. Medical and social history B. Use of alcohol, tobacco or illicit drugs  C. Current medications and supplements D. Functional ability and status E.  Nutritional status F.  Physical activity G. Advance directives H. List of other physicians I.  Hospitalizations, surgeries, and ER visits in previous 12 months J.  [place] such as hearing and vision if needed, cognitive and depression L. Referrals and appointments   In addition, I have reviewed and discussed with patient certain preventive protocols, quality metrics, and best practice recommendations. A written personalized care plan for preventive services as well as general preventive health recommendations were provided to patient.   [place] Score, [place]   [id number]  Nurse Health Advisor   Nurse Notes: Pt would like the Hep C lab order added to tomorrows blood work orders.

## 2019-09-10 ENCOUNTER — Telehealth: Payer: Self-pay

## 2019-09-10 LAB — COMPREHENSIVE METABOLIC PANEL
ALT: 27 IU/L (ref 0–32)
AST: 20 IU/L (ref 0–40)
Albumin/Globulin Ratio: 2 (ref 1.2–2.2)
Albumin: 4.5 g/dL (ref 3.8–4.8)
Alkaline Phosphatase: 105 IU/L (ref 39–117)
BUN/Creatinine Ratio: 20 (ref 12–28)
BUN: 15 mg/dL (ref 8–27)
Bilirubin Total: 0.3 mg/dL (ref 0.0–1.2)
CO2: 26 mmol/L (ref 20–29)
Calcium: 9.5 mg/dL (ref 8.7–10.3)
Chloride: 101 mmol/L (ref 96–106)
Creatinine, Ser: 0.74 mg/dL (ref 0.57–1.00)
GFR calc Af Amer: 97 mL/min/{1.73_m2} (ref >59)
GFR calc non Af Amer: 84 mL/min/{1.73_m2} (ref >59)
Globulin, Total: 2.2 g/dL (ref 1.5–4.5)
Glucose: 90 mg/dL (ref 65–99)
Potassium: 4 mmol/L (ref 3.5–5.2)
Sodium: 141 mmol/L (ref 134–144)
Total Protein: 6.7 g/dL (ref 6.0–8.5)

## 2019-09-10 LAB — LIPID PANEL
Chol/HDL Ratio: 4.1 ratio (ref 0.0–4.4)
Cholesterol, Total: 217 mg/dL — ABNORMAL HIGH (ref 100–199)
HDL: 53 mg/dL (ref >39)
LDL Chol Calc (NIH): 144 mg/dL — ABNORMAL HIGH (ref 0–99)
Triglycerides: 110 mg/dL (ref 0–149)
VLDL Cholesterol Cal: 20 mg/dL (ref 5–40)

## 2019-09-10 LAB — VITAMIN D 25 HYDROXY (VIT D DEFICIENCY, FRACTURES): Vit D, 25-Hydroxy: 31.5 ng/mL (ref 30.0–100.0)

## 2019-09-10 LAB — SEDIMENTATION RATE: Sed Rate: 4 mm/hr (ref 0–40)

## 2019-09-10 LAB — C-REACTIVE PROTEIN: CRP: 3 mg/L (ref 0–10)

## 2019-09-10 LAB — TSH: TSH: 1.13 u[IU]/mL (ref 0.450–4.500)

## 2019-09-10 LAB — PARATHYROID HORMONE, INTACT (NO CA): PTH: 34 pg/mL (ref 15–65)

## 2019-09-10 NOTE — Telephone Encounter (Signed)
Seen by patient [name] on 09/10/2019 9:22 AM EDT

## 2019-09-10 NOTE — Telephone Encounter (Signed)
-----   Message from Virginia Crews, MD sent at 09/10/2019  8:25 AM EDT ----- Normal labs, except cholesterol.  Recommend discussing this with PCP at upcoming physical

## 2019-09-13 ENCOUNTER — Other Ambulatory Visit: Payer: Self-pay

## 2019-09-13 ENCOUNTER — Ambulatory Visit (INDEPENDENT_AMBULATORY_CARE_PROVIDER_SITE_OTHER): Payer: PPO

## 2019-09-13 DIAGNOSIS — Z0000 Encounter for general adult medical examination without abnormal findings: Secondary | ICD-10-CM | POA: Diagnosis not present

## 2019-09-13 DIAGNOSIS — E278 Other specified disorders of adrenal gland: Secondary | ICD-10-CM | POA: Diagnosis not present

## 2019-09-13 NOTE — Patient Instructions (Signed)
Ms. April Best , Thank you for taking time to come for your Medicare Wellness Visit. I appreciate your ongoing commitment to your health goals. Please review the following plan we discussed and let me know if I can assist you in the future.   Screening recommendations/referrals: Colonoscopy: Up to date, due 11/2027 Mammogram: Up to date, due 07/2021 Bone Density: Up to date, due 02/2022 Recommended yearly ophthalmology/optometry visit for glaucoma screening and checkup Recommended yearly dental visit for hygiene and checkup  Vaccinations: Influenza vaccine: Pt declines today.  Pneumococcal vaccine: Completed series Tdap vaccine: Up to date Shingles vaccine: Pt declines today.     Advanced directives: Please bring a copy of your POA (Power of Attorney) and/or Living Will to your next appointment.   Conditions/risks identified: Smoking cessation discussed today.  Next appointment: 09/14/19 @ 10:00 AM with [place] 68 Years and Older, Female Preventive care refers to lifestyle choices and visits with your health care provider that can promote health and wellness. What does preventive care include?  A yearly physical exam. This is also called an annual well check.  Dental exams once or twice a year.  Routine eye exams. Ask your health care provider how often you should have your eyes checked.  Personal lifestyle choices, including:  Daily care of your teeth and gums.  Regular physical activity.  Eating a healthy diet.  Avoiding tobacco and drug use.  Limiting alcohol use.  Practicing safe sex.  Taking low-dose aspirin every day.  Taking vitamin and mineral supplements as recommended by your health care provider. What happens during an annual well check? The services and screenings done by your health care provider during your annual well check will depend on your age, overall health, lifestyle risk factors, and family history of disease. Counseling    Your health care provider may ask you questions about your:  Alcohol use.  Tobacco use.  Drug use.  Emotional well-being.  Home and relationship well-being.  Sexual activity.  Eating habits.  History of falls.  Memory and ability to understand (cognition).  Work and work Statistician.  Reproductive health. Screening  You may have the following tests or measurements:  Height, weight, and BMI.  Blood pressure.  Lipid and cholesterol levels. These may be checked every 5 years, or more frequently if you are over 77 years old.  Skin check.  Lung cancer screening. You may have this screening every year starting at age 62 if you have a 30-pack-year history of smoking and currently smoke or have quit within the past 15 years.  Fecal occult blood test (FOBT) of the stool. You may have this test every year starting at age 68.  Flexible sigmoidoscopy or colonoscopy. You may have a sigmoidoscopy every 5 years or a colonoscopy every 10 years starting at age 84.  Hepatitis C blood test.  Hepatitis B blood test.  Sexually transmitted disease (STD) testing.  Diabetes screening. This is done by checking your blood sugar (glucose) after you have not eaten for a while (fasting). You may have this done every 1-3 years.  Bone density scan. This is done to screen for osteoporosis. You may have this done starting at age 68.  Mammogram. This may be done every 1-2 years. Talk to your health care provider about how often you should have regular mammograms. Talk with your health care provider about your test results, treatment options, and if necessary, the need for more tests. Vaccines  Your health care provider  may recommend certain vaccines, such as:  Influenza vaccine. This is recommended every year.  Tetanus, diphtheria, and acellular pertussis (Tdap, Td) vaccine. You may need a Td booster every 10 years.  Zoster vaccine. You may need this after age 68.  Pneumococcal 13-valent  conjugate (PCV13) vaccine. One dose is recommended after age 68.  Pneumococcal polysaccharide (PPSV23) vaccine. One dose is recommended after age 68. Talk to your health care provider about which screenings and vaccines you need and how often you need them. This information is not intended to replace advice given to you by your health care provider. Make sure you discuss any questions you have with your health care provider. Document Released: 06/30/2015 Document Revised: 02/21/2016 Document Reviewed: 04/04/2015 Elsevier Interactive Patient Education  2017 [place] Prevention in the Home Falls can cause injuries. They can happen to people of all ages. There are many things you can do to make your home safe and to help prevent falls. What can I do on the outside of my home?  Regularly fix the edges of walkways and driveways and fix any cracks.  Remove anything that might make you trip as you walk through a door, such as a raised step or threshold.  Trim any bushes or trees on the path to your home.  Use bright outdoor lighting.  Clear any walking paths of anything that might make someone trip, such as rocks or tools.  Regularly check to see if handrails are loose or broken. Make sure that both sides of any steps have handrails.  Any raised decks and porches should have guardrails on the edges.  Have any leaves, snow, or ice cleared regularly.  Use sand or salt on walking paths during winter.  Clean up any spills in your garage right away. This includes oil or grease spills. What can I do in the bathroom?  Use night lights.  Install grab bars by the toilet and in the tub and shower. Do not use towel bars as grab bars.  Use non-skid mats or decals in the tub or shower.  If you need to sit down in the shower, use a plastic, non-slip stool.  Keep the floor dry. Clean up any water that spills on the floor as soon as it happens.  Remove soap buildup in the tub or  shower regularly.  Attach bath mats securely with double-sided non-slip rug tape.  Do not have throw rugs and other things on the floor that can make you trip. What can I do in the bedroom?  Use night lights.  Make sure that you have a light by your bed that is easy to reach.  Do not use any sheets or blankets that are too big for your bed. They should not hang down onto the floor.  Have a firm chair that has side arms. You can use this for support while you get dressed.  Do not have throw rugs and other things on the floor that can make you trip. What can I do in the kitchen?  Clean up any spills right away.  Avoid walking on wet floors.  Keep items that you use a lot in easy-to-reach places.  If you need to reach something above you, use a strong step stool that has a grab bar.  Keep electrical cords out of the way.  Do not use floor polish or wax that makes floors slippery. If you must use wax, use non-skid floor wax.  Do not have throw rugs  and other things on the floor that can make you trip. What can I do with my stairs?  Do not leave any items on the stairs.  Make sure that there are handrails on both sides of the stairs and use them. Fix handrails that are broken or loose. Make sure that handrails are as long as the stairways.  Check any carpeting to make sure that it is firmly attached to the stairs. Fix any carpet that is loose or worn.  Avoid having throw rugs at the top or bottom of the stairs. If you do have throw rugs, attach them to the floor with carpet tape.  Make sure that you have a light switch at the top of the stairs and the bottom of the stairs. If you do not have them, ask someone to add them for you. What else can I do to help prevent falls?  Wear shoes that:  Do not have high heels.  Have rubber bottoms.  Are comfortable and fit you well.  Are closed at the toe. Do not wear sandals.  If you use a stepladder:  Make sure that it is fully  opened. Do not climb a closed stepladder.  Make sure that both sides of the stepladder are locked into place.  Ask someone to hold it for you, if possible.  Clearly mark and make sure that you can see:  Any grab bars or handrails.  First and last steps.  Where the edge of each step is.  Use tools that help you move around (mobility aids) if they are needed. These include:  Canes.  Walkers.  Scooters.  Crutches.  Turn on the lights when you go into a dark area. Replace any light bulbs as soon as they burn out.  Set up your furniture so you have a clear path. Avoid moving your furniture around.  If any of your floors are uneven, fix them.  If there are any pets around you, be aware of where they are.  Review your medicines with your doctor. Some medicines can make you feel dizzy. This can increase your chance of falling. Ask your doctor what other things that you can do to help prevent falls. This information is not intended to replace advice given to you by your health care provider. Make sure you discuss any questions you have with your health care provider. Document Released: 03/30/2009 Document Revised: 11/09/2015 Document Reviewed: 07/08/2014 Elsevier Interactive Patient Education  2017 [employer].

## 2019-09-14 ENCOUNTER — Other Ambulatory Visit: Payer: Self-pay | Admitting: Physician Assistant

## 2019-09-14 ENCOUNTER — Ambulatory Visit (INDEPENDENT_AMBULATORY_CARE_PROVIDER_SITE_OTHER): Payer: PPO | Admitting: Physician Assistant

## 2019-09-14 ENCOUNTER — Encounter: Payer: Self-pay | Admitting: Physician Assistant

## 2019-09-14 ENCOUNTER — Other Ambulatory Visit: Payer: Self-pay

## 2019-09-14 VITALS — BP 118/68 | HR 70 | Temp 96.8°F | Ht 61.0 in | Wt 122.8 lb

## 2019-09-14 DIAGNOSIS — F1021 Alcohol dependence, in remission: Secondary | ICD-10-CM | POA: Diagnosis not present

## 2019-09-14 DIAGNOSIS — J321 Chronic frontal sinusitis: Secondary | ICD-10-CM | POA: Diagnosis not present

## 2019-09-14 DIAGNOSIS — Z0000 Encounter for general adult medical examination without abnormal findings: Secondary | ICD-10-CM | POA: Diagnosis not present

## 2019-09-14 DIAGNOSIS — F339 Major depressive disorder, recurrent, unspecified: Secondary | ICD-10-CM | POA: Diagnosis not present

## 2019-09-14 DIAGNOSIS — M791 Myalgia, unspecified site: Secondary | ICD-10-CM | POA: Diagnosis not present

## 2019-09-14 DIAGNOSIS — J439 Emphysema, unspecified: Secondary | ICD-10-CM | POA: Diagnosis not present

## 2019-09-14 DIAGNOSIS — K130 Diseases of lips: Secondary | ICD-10-CM

## 2019-09-14 NOTE — Patient Instructions (Addendum)
Talk with orthopedist about rheumatology referral for joint pain. Talk with Dr. Honor Junes about prolia.    Health Maintenance After Age 68 After age 61, you are at a higher risk for certain long-term diseases and infections as well as injuries from falls. Falls are a major cause of broken bones and head injuries in people who are older than age 80. Getting regular preventive care can help to keep you healthy and well. Preventive care includes getting regular testing and making lifestyle changes as recommended by your health care provider. Talk with your health care provider about:  Which screenings and tests you should have. A screening is a test that checks for a disease when you have no symptoms.  A diet and exercise plan that is right for you. What should I know about screenings and tests to prevent falls? Screening and testing are the best ways to find a health problem early. Early diagnosis and treatment give you the best chance of managing medical conditions that are common after age 38. Certain conditions and lifestyle choices may make you more likely to have a fall. Your health care provider may recommend:  Regular vision checks. Poor vision and conditions such as cataracts can make you more likely to have a fall. If you wear glasses, make sure to get your prescription updated if your vision changes.  Medicine review. Work with your health care provider to regularly review all of the medicines you are taking, including over-the-counter medicines. Ask your health care provider about any side effects that may make you more likely to have a fall. Tell your health care provider if any medicines that you take make you feel dizzy or sleepy.  Osteoporosis screening. Osteoporosis is a condition that causes the bones to get weaker. This can make the bones weak and cause them to break more easily.  Blood pressure screening. Blood pressure changes and medicines to control blood pressure can make you  feel dizzy.  Strength and balance checks. Your health care provider may recommend certain tests to check your strength and balance while standing, walking, or changing positions.  Foot health exam. Foot pain and numbness, as well as not wearing proper footwear, can make you more likely to have a fall.  Depression screening. You may be more likely to have a fall if you have a fear of falling, feel emotionally low, or feel unable to do activities that you used to do.  Alcohol use screening. Using too much alcohol can affect your balance and may make you more likely to have a fall. What actions can I take to lower my risk of falls? General instructions  Talk with your health care provider about your risks for falling. Tell your health care provider if: ? You fall. Be sure to tell your health care provider about all falls, even ones that seem minor. ? You feel dizzy, sleepy, or off-balance.  Take over-the-counter and prescription medicines only as told by your health care provider. These include any supplements.  Eat a healthy diet and maintain a healthy weight. A healthy diet includes low-fat dairy products, low-fat (lean) meats, and fiber from whole grains, beans, and lots of fruits and vegetables. Home safety  Remove any tripping hazards, such as rugs, cords, and clutter.  Install safety equipment such as grab bars in bathrooms and safety rails on stairs.  Keep rooms and walkways well-lit. Activity   Follow a regular exercise program to stay fit. This will help you maintain your balance. Ask  your health care provider what types of exercise are appropriate for you.  If you need a cane or walker, use it as recommended by your health care provider.  Wear supportive shoes that have nonskid soles. Lifestyle  Do not drink alcohol if your health care provider tells you not to drink.  If you drink alcohol, limit how much you have: ? 0-1 drink a day for women. ? 0-2 drinks a day for  men.  Be aware of how much alcohol is in your drink. In the [place] one drink equals one typical bottle of beer (12 oz), one-half glass of wine (5 oz), or one shot of hard liquor (1 oz).  Do not use any products that contain nicotine or tobacco, such as cigarettes and e-cigarettes. If you need help quitting, ask your health care provider. Summary  Having a healthy lifestyle and getting preventive care can help to protect your health and wellness after age 62.  Screening and testing are the best way to find a health problem early and help you avoid having a fall. Early diagnosis and treatment give you the best chance for managing medical conditions that are more common for people who are older than age 6.  Falls are a major cause of broken bones and head injuries in people who are older than age 87. Take precautions to prevent a fall at home.  Work with your health care provider to learn what changes you can make to improve your health and wellness and to prevent falls. This information is not intended to replace advice given to you by your health care provider. Make sure you discuss any questions you have with your health care provider. Document Revised: 09/24/2018 Document Reviewed: 04/16/2017 Elsevier Patient Education  2020 [employer].

## 2019-09-14 NOTE — Progress Notes (Signed)
Patient: April Best, Female    DOB: [birth year], 69 y.o.   MRN: [id number] Visit Date: 09/14/2019  Today's Provider: Trinna Post, PA-C   Chief Complaint  Patient presents with  . Annual Exam   Subjective:   AWE was on 09/13/2019 w/ [place]  Complete Physical April Best is a 69 y.o. female. She feels fairly well. She reports exercising none. She reports she is sleeping well.  HLD: Was on crestor 5 mg QHS but stopped this recently due to bone pain. She has not taken this in several months,   Lipid Panel     Component Value Date/Time   CHOL 217 (H) 09/09/2019 1005   TRIG 110 09/09/2019 1005   HDL 53 09/09/2019 1005   CHOLHDL 4.1 09/09/2019 1005   LDLCALC 144 (H) 09/09/2019 1005   LABVLDL 20 09/09/2019 1005   Patient reports several month onset of bilateral hip pain, worse in the morning. Does not have trouble getting up from chairs. Reports bilateral upper arm pain. Does not have trouble reaching above head. Has been followed by orthopedist for cervical radiculopathy and arthritis. Has had nerve blocks in neck but still experiences flares of pain there. Had CRP and ESR checked in the past month which were normal. She had a course of prednisone for potential shingles which was unhelpful to overall pain.   Recently diagnosed with osteoporosis through Dexa at [facility]. Would like to pursue Prolia.   Has recurrent issue with dry lip and burning sensation. Denies dry eyes. No rash present.   ----------------------------------------------------------- Last mammogram:08/03/2019 Last colonoscopy:11/19/2017 Last bone density:02/26/2017 Last JJK:[id number] Review of Systems  Constitutional: Positive for fatigue.  HENT: Positive for ear pain.   Eyes: Negative.   Respiratory: Negative.   Cardiovascular: Negative.   Gastrointestinal: Positive for constipation and nausea.  Endocrine: Negative.   Genitourinary: Negative.   Musculoskeletal: Positive for arthralgias,  back pain, myalgias, neck pain and neck stiffness.  Skin: Negative.   Allergic/Immunologic: Negative.   Neurological: Positive for dizziness and headaches.  Hematological: Bruises/bleeds easily.  Psychiatric/Behavioral: Negative.     Social History   Socioeconomic History  . Marital status: Married    Spouse name: Not on file  . Number of children: 1  . Years of education: Not on file  . Highest education level: High school graduate  Occupational History  . Not on file  Tobacco Use  . Smoking status: Current Every Day Smoker    Packs/day: 0.50    Years: 49.00    Pack years: 24.50    Types: Cigarettes  . Smokeless tobacco: Never Used  . Tobacco comment: pt declines smoking cessation  Substance and Sexual Activity  . Alcohol use: Not Currently    Alcohol/week: 0.0 standard drinks  . Drug use: No  . Sexual activity: Not on file  Other Topics Concern  . Not on file  Social History Narrative  . Not on file   Social Determinants of Health   Financial Resource Strain: Low Risk   . Difficulty of Paying Living Expenses: Not hard at all  Food Insecurity: No Food Insecurity  . Worried About Charity fundraiser in the Last Year: Never true  . Ran Out of Food in the Last Year: Never true  Transportation Needs: No Transportation Needs  . Lack of Transportation (Medical): No  . Lack of Transportation (Non-Medical): No  Physical Activity: Inactive  . Days of Exercise per Week: 0 days  . Minutes of  Exercise per Session: 0 min  Stress: Stress Concern Present  . Feeling of Stress : Rather much  Social Connections: Somewhat Isolated  . Frequency of Communication with Friends and Family: More than three times a week  . Frequency of Social Gatherings with Friends and Family: Never  . Attends Religious Services: Never  . Active Member of Clubs or Organizations: No  . Attends Archivist Meetings: Never  . Marital Status: Married  Human resources officer Violence: Not At Risk  .  Fear of Current or Ex-Partner: No  . Emotionally Abused: No  . Physically Abused: No  . Sexually Abused: No    Past Medical History:  Diagnosis Date  . Allergy   . Annular oral lichen planus   . Anxiety   . Depression   . Guaiac positive stools    history  . H. pylori infection   . 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 History of palpitations   . Hyperlipidemia   . Hypertension   . Migraine   . Osteoporosis   . Panic disorder   . Psoriasis      Patient Active Problem List   Diagnosis Date Noted  . Emphysema of lung ([place]) 07/24/2018  . Alcohol abuse, in remission 07/16/2018  . Macrocytosis without anemia 03/29/2016  . Elevated glucose 02/13/2015  . [name] ([place]) 12/14/2014  . Recurrent major depressive episodes ([place]) 11/02/2014  . Family history of hemochromatosis 11/02/2014  . Hypercholesteremia 11/02/2014  . Benign hypertension 11/02/2014  . Insomnia 11/02/2014  . OP (osteoporosis) 11/02/2014  . Current tobacco use 11/02/2014  . Oral lichen planus 08/67/6195  . Episodic paroxysmal anxiety disorder 10/02/2007  . Gastrointestinal ulcer due to Helicobacter pylori 09/32/6712  . Headache, migraine 12/07/2004  . Allergic rhinitis 11/19/2004  . Psoriasis 07/31/2004    Past Surgical History:  Procedure Laterality Date  . ABDOMINAL HYSTERECTOMY  1983   Ovaries have been removed.  . ABDOMINOPLASTY    . APPENDECTOMY    . BREAST BIOPSY Right    neg- core  . COLONOSCOPY  03/2006  . COLONOSCOPY WITH PROPOFOL N/A 11/19/2017   Procedure: COLONOSCOPY WITH PROPOFOL;  Surgeon: Toledo, Benay Pike, MD;  Location: [facility] ENDOSCOPY;  Service: Gastroenterology;  Laterality: N/A;  . RHINOPLASTY  2002  . UPPER GI ENDOSCOPY  2008    Her family history includes ADD / ADHD in her brother; Coronary artery disease in her father; Fibromyalgia in her mother and sister; Heart attack in her father; Hemochromatosis in her sister; Hyperlipidemia in her father, mother, and sister;  Hypertension in her father, mother, and sister; Hypothyroidism in her mother; Lung cancer in her father. There is no history of Breast cancer.   Current Outpatient Medications:  .  amLODipine (NORVASC) 10 MG tablet, TAKE 1 TABLET BY MOUTH DAILY, Disp: 30 tablet, Rfl: 0 .  azelastine (ASTELIN) 0.1 % nasal spray, azelastine 137 mcg (0.1 %) nasal spray aerosol, Disp: , Rfl:  .  clobetasol ointment (TEMOVATE) 0.05 %, APP TOPICALLY TO GLUTEAL AREA ONCE D PRF DERMATITIS, Disp: 30 g, Rfl: 1 .  escitalopram (LEXAPRO) 20 MG tablet, TAKE 1 TABLET BY MOUTH EVERY DAY, Disp: 90 tablet, Rfl: 1 .  fluticasone (FLONASE) 50 MCG/ACT nasal spray, SHAKE LIQUID AND USE 2 SPRAYS IN EACH NOSTRIL EVERY DAY, Disp: 16 g, Rfl: 0 .  hydrochlorothiazide (HYDRODIURIL) 25 MG tablet, TAKE 1 TABLET(25 MG) BY MOUTH DAILY, Disp: 90 tablet, Rfl: 0 .  loratadine (CLARITIN) 10 MG tablet, Take 10 mg by mouth daily., Disp: ,  Rfl:  .  meloxicam (MOBIC) 15 MG tablet, Take 15 mg by mouth daily., Disp: , Rfl:  .  methocarbamol (ROBAXIN) 500 MG tablet, Take 500 mg by mouth every 8 (eight) hours as needed., Disp: , Rfl:  .  ondansetron (ZOFRAN) 4 MG tablet, ondansetron HCl 4 mg tablet  TAKE 1 TABLET BY MOUTH EVERY DAY, Disp: , Rfl:  .  rosuvastatin (CRESTOR) 5 MG tablet, TAKE 1 TABLET(5 MG) BY MOUTH DAILY, Disp: 90 tablet, Rfl: 1 .  traZODone (DESYREL) 100 MG tablet, TAKE 1 TABLET(100 MG) BY MOUTH AT BEDTIME AS NEEDED FOR SLEEP, Disp: 30 tablet, Rfl: 0 .  valACYclovir (VALTREX) 1000 MG tablet, Take 1,000 mg by mouth 3 (three) times daily., Disp: , Rfl:  .  calcipotriene (DOVONOX) 0.005 % ointment, Apply topically 2 (two) times daily., Disp: , Rfl:  .  HYDROcodone-acetaminophen (NORCO/VICODIN) 5-325 MG tablet, Take 1 tablet by mouth every 4 (four) hours as needed., Disp: , Rfl:  .  predniSONE (DELTASONE) 20 MG tablet, , Disp: , Rfl:   Patient Care Team: Paulene Floor as PCP - General (Physician Assistant) Cammie Sickle,  MD as Consulting Physician (Internal Medicine) Nadene Rubins, DO as Referring Physician (Physical Medicine and Rehabilitation) Lonia Farber, MD as Consulting Physician (Internal Medicine)     Objective:    Vitals: BP 118/68 (BP Location: Left Arm, Patient Position: Sitting, Cuff Size: Normal)   Pulse 70   Temp (!) 96.8 F (36 C) (Temporal)   Ht '5\' 1"'$  (1.549 m)   Wt 122 lb 12.8 oz (55.7 kg)   SpO2 98%   BMI 23.20 kg/m   Physical Exam Constitutional:      Appearance: Normal appearance.  Cardiovascular:     Rate and Rhythm: Normal rate and regular rhythm.     Heart sounds: Normal heart sounds.  Pulmonary:     Effort: Pulmonary effort is normal.     Breath sounds: Normal breath sounds.  Abdominal:     General: Bowel sounds are normal.     Palpations: Abdomen is soft.  Skin:    General: Skin is warm and dry.  Neurological:     Mental Status: She is alert and oriented to person, place, and time. Mental status is at baseline.  Psychiatric:        Mood and Affect: Mood normal.        Behavior: Behavior normal.     Activities of Daily Living In your present state of health, do you have any difficulty performing the following activities: 09/14/2019 09/13/2019  Hearing? N N  Vision? N N  Difficulty concentrating or making decisions? N N  Walking or climbing stairs? Y Y  Comment - Due to knee pains.  Dressing or bathing? N N  Doing errands, shopping? N N  Preparing Food and eating ? - N  Using the Toilet? - N  In the past six months, have you accidently leaked urine? - Y  Comment - Occasionally when stands up or in the night  Do you have problems with loss of bowel control? - N  Managing your Medications? - N  Managing your Finances? - N  Housekeeping or managing your Housekeeping? - N  Some recent data might be hidden    Fall Risk Assessment Fall Risk  09/14/2019 09/13/2019 07/08/2018 03/05/2017 01/23/2017  Falls in the past year? 0 0 0 No No  Number falls in past  yr: 0 0 - - -  Injury with Fall?  0 0 - - -     Depression Screen PHQ 2/9 Scores 09/14/2019 09/13/2019 07/08/2018 01/15/2017  PHQ - 2 Score 2 1 0 0  PHQ- 9 Score 4 - 4 -    6CIT Screen 09/13/2019  What Year? 0 points  What month? 0 points  What time? 3 points  Count back from 20 0 points  Months in reverse 0 points  Repeat phrase 0 points  Total Score 3       Assessment & Plan:    Annual Physical Reviewed patient's Family Medical History Reviewed and updated list of patient's medical providers Assessment of cognitive impairment was done Assessed patient's functional ability Established a written schedule for health screening [place] Completed and Reviewed  Exercise Activities and Dietary recommendations Goals    . Exercise 150 minutes per week (moderate activity)    . Quit smoking / using tobacco       Immunization History  Administered Date(s) Administered  . Influenza Split 02/21/2010, 04/22/2012, 07/15/2016  . Influenza, High Dose Seasonal PF 03/05/2017, 07/08/2018  . Influenza,inj,Quad PF,6+ Mos 03/23/2015  . Pneumococcal Conjugate-13 01/15/2017  . Pneumococcal Polysaccharide-23 04/22/2012, 07/08/2018  . Tdap 02/21/2010  . Zoster 11/04/2013    Health Maintenance  Topic Date Due  . Hepatitis C Screening  Never done  . INFLUENZA VACCINE  09/15/2019 (Originally 01/16/2019)  . TETANUS/TDAP  02/22/2020  . MAMMOGRAM  08/02/2021  . DEXA SCAN  09/05/2024  . COLONOSCOPY  11/20/2027  . PNA vac Low Risk Adult  Completed     Discussed health benefits of physical activity, and encouraged her to engage in regular exercise appropriate for her age and condition.    1. Annual physical exam  If she would like to pursue prolia for osteoporosis, recommend she discuss with Dr. Honor Junes from [facility] whom she is already established with. May have to try bisphosphonate first.  - Hepatitis c antibody (reflex)  2. Muscle pain  She had  stopped crestor but has since restarted it as it did not improve symptoms. She may resume crestor and I will check labs as below. Symptoms not classic of PMR and inflammatory markers negative, no improvement on prednisone. Counseled these are not specifically diagnostic for PMR and she does have multi joint pain, She might benefit from rheumatology. She will discuss with orthopedist and I can place referral if she would like to proceed.   - CK (Creatine Kinase)  3. Dry lips  - Sjogren's syndrome antibods(ssa + ssb)  4. Chronic frontal sinusitis  - Ambulatory referral to ENT  5. Alcohol dependence, in remission ([place])   6. Pulmonary emphysema, unspecified emphysema type ([place])   7. Recurrent major depressive episodes ([place])  The entirety of the information documented in the History of Present Illness, Review of Systems and Physical Exam were personally obtained by me. Portions of this information were initially documented by [facility] and reviewed by me for thoroughness and accuracy.   F/u 6 months to 1 year  ------------------------------------------------------------------------------------------------------------    Trinna Post, PA-C  [place]

## 2019-09-15 DIAGNOSIS — E278 Other specified disorders of adrenal gland: Secondary | ICD-10-CM | POA: Diagnosis not present

## 2019-09-15 LAB — HCV COMMENT:

## 2019-09-15 LAB — CK: Total CK: 111 U/L (ref 32–182)

## 2019-09-15 LAB — SJOGREN'S SYNDROME ANTIBODS(SSA + SSB)
ENA SSA (RO) Ab: <0.2 AI (ref 0.0–0.9)
ENA SSB (LA) Ab: <0.2 AI (ref 0.0–0.9)

## 2019-09-15 LAB — HEPATITIS C ANTIBODY (REFLEX): HCV Ab: <0.1 s/co ratio (ref 0.0–0.9)

## 2019-09-20 DIAGNOSIS — M545 Low back pain: Secondary | ICD-10-CM | POA: Diagnosis not present

## 2019-09-20 DIAGNOSIS — M542 Cervicalgia: Secondary | ICD-10-CM | POA: Diagnosis not present

## 2019-09-20 DIAGNOSIS — M47812 Spondylosis without myelopathy or radiculopathy, cervical region: Secondary | ICD-10-CM | POA: Diagnosis not present

## 2019-09-22 DIAGNOSIS — M5412 Radiculopathy, cervical region: Secondary | ICD-10-CM | POA: Diagnosis not present

## 2019-09-22 DIAGNOSIS — Z79899 Other long term (current) drug therapy: Secondary | ICD-10-CM | POA: Diagnosis not present

## 2019-09-22 DIAGNOSIS — N281 Cyst of kidney, acquired: Secondary | ICD-10-CM | POA: Diagnosis not present

## 2019-09-22 DIAGNOSIS — R110 Nausea: Secondary | ICD-10-CM | POA: Diagnosis not present

## 2019-09-22 DIAGNOSIS — J320 Chronic maxillary sinusitis: Secondary | ICD-10-CM | POA: Diagnosis not present

## 2019-09-22 DIAGNOSIS — M542 Cervicalgia: Secondary | ICD-10-CM | POA: Diagnosis not present

## 2019-09-22 DIAGNOSIS — J340 Abscess, furuncle and carbuncle of nose: Secondary | ICD-10-CM | POA: Diagnosis not present

## 2019-09-22 DIAGNOSIS — M810 Age-related osteoporosis without current pathological fracture: Secondary | ICD-10-CM | POA: Diagnosis not present

## 2019-09-22 DIAGNOSIS — M545 Low back pain: Secondary | ICD-10-CM | POA: Diagnosis not present

## 2019-09-22 DIAGNOSIS — J301 Allergic rhinitis due to pollen: Secondary | ICD-10-CM | POA: Diagnosis not present

## 2019-10-06 ENCOUNTER — Other Ambulatory Visit: Payer: Self-pay | Admitting: Physician Assistant

## 2019-10-06 DIAGNOSIS — M542 Cervicalgia: Secondary | ICD-10-CM | POA: Diagnosis not present

## 2019-10-06 DIAGNOSIS — M545 Low back pain: Secondary | ICD-10-CM | POA: Diagnosis not present

## 2019-10-06 DIAGNOSIS — I10 Essential (primary) hypertension: Secondary | ICD-10-CM

## 2019-10-06 NOTE — Telephone Encounter (Signed)
Requested Prescriptions  Pending Prescriptions Disp Refills  . amLODipine (NORVASC) 10 MG tablet [Pharmacy Med Name: AMLODIPINE BESYLATE 10MG  TABLETS] 90 tablet 1    Sig: TAKE 1 TABLET BY MOUTH DAILY     Cardiovascular:  Calcium Channel Blockers Passed - 10/06/2019  6:21 AM      Passed - Last BP in normal range    BP Readings from Last 1 Encounters:  09/14/19 118/68         Passed - Valid encounter within last 6 months    Recent Outpatient Visits          3 weeks ago Annual physical exam   [facility] Carles Collet M, PA-C   4 weeks ago Age-related osteoporosis without current pathological fracture   [facility] [place], Dionne Bucy, MD   2 months ago Neck pain   [place], [place], [place]   12 months ago [place], Wendee Beavers, [place]   1 year ago Annual physical exam   [facility] [place], Wendee Beavers, [place]      Future Appointments            In 3 months [place] Buss[place], Elisha Headland, MD [facility]           . hydrochlorothiazide (HYDRODIURIL) 25 MG tablet [Pharmacy Med Name: HYDROCHLOROTHIAZIDE 25MG  TABLETS] 90 tablet 0    Sig: TAKE 1 TABLET(25 MG) BY MOUTH DAILY     Cardiovascular: Diuretics - Thiazide Passed - 10/06/2019  6:21 AM      Passed - Ca in normal range and within 360 days    Calcium  Date Value Ref Range Status  09/09/2019 9.5 8.7 - 10.3 mg/dL Final         Passed - Cr in normal range and within 360 days    Creat  Date Value Ref Range Status  05/05/2017 0.57 0.50 - 0.99 mg/dL Final    Comment:    For patients >25 years of age, the reference limit for Creatinine is approximately 13% higher for people identified as African-American. .    Creatinine, Ser  Date Value Ref Range Status  09/09/2019 0.74 0.57 - 1.00 mg/dL Final         Passed - K in normal range and within 360 days    Potassium  Date Value Ref  Range Status  09/09/2019 4.0 3.5 - 5.2 mmol/L Final         Passed - Na in normal range and within 360 days    Sodium  Date Value Ref Range Status  09/09/2019 141 134 - 144 mmol/L Final         Passed - Last BP in normal range    BP Readings from Last 1 Encounters:  09/14/19 118/68         Passed - Valid encounter within last 6 months    Recent Outpatient Visits          3 weeks ago Annual physical exam   [employer], [place], PA-C   4 weeks ago Age-related osteoporosis without current pathological fracture   [facility] [place], Dionne Bucy, MD   2 months ago Neck pain   [place], [place], [place]   12 months ago [place], Wendee Beavers, [place]   1 year ago Annual physical exam   [place], [place], [place]  Future Appointments            In 3 months [place], Elisha Headland, MD [facility]

## 2019-10-08 DIAGNOSIS — M4726 Other spondylosis with radiculopathy, lumbar region: Secondary | ICD-10-CM | POA: Diagnosis not present

## 2019-10-08 DIAGNOSIS — M4802 Spinal stenosis, cervical region: Secondary | ICD-10-CM | POA: Diagnosis not present

## 2019-10-08 DIAGNOSIS — M4712 Other spondylosis with myelopathy, cervical region: Secondary | ICD-10-CM | POA: Diagnosis not present

## 2019-10-08 DIAGNOSIS — M8438XD Stress fracture, other site, subsequent encounter for fracture with routine healing: Secondary | ICD-10-CM | POA: Diagnosis not present

## 2019-10-08 DIAGNOSIS — M4316 Spondylolisthesis, lumbar region: Secondary | ICD-10-CM | POA: Diagnosis not present

## 2019-10-12 DIAGNOSIS — M545 Low back pain: Secondary | ICD-10-CM | POA: Diagnosis not present

## 2019-10-12 DIAGNOSIS — M542 Cervicalgia: Secondary | ICD-10-CM | POA: Diagnosis not present

## 2019-10-13 DIAGNOSIS — M810 Age-related osteoporosis without current pathological fracture: Secondary | ICD-10-CM | POA: Insufficient documentation

## 2019-10-15 ENCOUNTER — Telehealth: Payer: Self-pay

## 2019-10-15 ENCOUNTER — Encounter: Payer: Self-pay | Admitting: Physician Assistant

## 2019-10-15 ENCOUNTER — Ambulatory Visit (INDEPENDENT_AMBULATORY_CARE_PROVIDER_SITE_OTHER): Payer: PPO | Admitting: Physician Assistant

## 2019-10-15 ENCOUNTER — Other Ambulatory Visit: Payer: Self-pay

## 2019-10-15 VITALS — BP 130/80 | HR 75 | Temp 97.3°F | Resp 16 | Wt 121.0 lb

## 2019-10-15 DIAGNOSIS — R350 Frequency of micturition: Secondary | ICD-10-CM | POA: Diagnosis not present

## 2019-10-15 DIAGNOSIS — R32 Unspecified urinary incontinence: Secondary | ICD-10-CM | POA: Diagnosis not present

## 2019-10-15 DIAGNOSIS — N281 Cyst of kidney, acquired: Secondary | ICD-10-CM | POA: Diagnosis not present

## 2019-10-15 DIAGNOSIS — N3000 Acute cystitis without hematuria: Secondary | ICD-10-CM

## 2019-10-15 LAB — POCT URINALYSIS DIPSTICK
Bilirubin, UA: NEGATIVE
Blood, UA: NEGATIVE
Glucose, UA: NEGATIVE
Ketones, UA: NEGATIVE
Nitrite, UA: NEGATIVE
Protein, UA: NEGATIVE
Spec Grav, UA: 1.01 (ref 1.010–1.025)
Urobilinogen, UA: 0.2 E.U./dL
pH, UA: 6.5 (ref 5.0–8.0)

## 2019-10-15 MED ORDER — CEPHALEXIN 500 MG PO CAPS: 500.0000 mg | ORAL_CAPSULE | Freq: Two times a day (BID) | ORAL | 0 refills | Status: DC

## 2019-10-15 NOTE — Telephone Encounter (Signed)
Patient scheduled office visit with Grace Bushy.

## 2019-10-15 NOTE — Progress Notes (Addendum)
Established patient visit   Patient: April Best   DOB: 1950-12-26   69 y.o. Female  MRN: [id number] Visit Date: 10/15/2019  Today's healthcare provider: Mar Daring, PA-C   Chief Complaint  Patient presents with  . Urinary Frequency   Subjective    Urinary Frequency  This is a new problem. The current episode started in the past 7 days. The problem has been gradually worsening. The quality of the pain is described as aching. The patient is experiencing no pain. There has been no fever. She is not sexually active. There is no history of pyelonephritis. Associated symptoms include frequency and urgency. Pertinent negatives include no discharge, flank pain, hematuria or possible pregnancy. Associated symptoms comments: Pressure in her bladder and aching in her inner thighs. She has tried increased fluids for the symptoms.    Patient Active Problem List   Diagnosis Date Noted  . Alcohol dependence, in remission ([place]) 09/14/2019  . Emphysema of lung ([place]) 07/24/2018  . Alcohol abuse, in remission 07/16/2018  . Macrocytosis without anemia 03/29/2016  . Elevated glucose 02/13/2015  . [name] ([place]) 12/14/2014  . Recurrent major depressive episodes ([place]) 11/02/2014  . Family history of hemochromatosis 11/02/2014  . Hypercholesteremia 11/02/2014  . Benign hypertension 11/02/2014  . Insomnia 11/02/2014  . OP (osteoporosis) 11/02/2014  . Current tobacco use 11/02/2014  . Oral lichen planus 0000000  . Episodic paroxysmal anxiety disorder 10/02/2007  . Gastrointestinal ulcer due to Helicobacter pylori [id number]  . Headache, migraine 12/07/2004  . Allergic rhinitis 11/19/2004  . Psoriasis 07/31/2004   Past Medical History:  Diagnosis Date  . Allergy   . Annular oral lichen planus   . Anxiety   . Depression   . Guaiac positive stools    history  . H. pylori infection   . 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 History of palpitations   .  Hyperlipidemia   . Hypertension   . Migraine   . Osteoporosis   . Panic disorder   . Psoriasis        Medications: Outpatient Medications Prior to Visit  Medication Sig  . amLODipine (NORVASC) 10 MG tablet TAKE 1 TABLET BY MOUTH DAILY  . azelastine (ASTELIN) 0.1 % nasal spray azelastine 137 mcg (0.1 %) nasal spray aerosol  . clobetasol ointment (TEMOVATE) 0.05 % APP TOPICALLY TO GLUTEAL AREA ONCE D PRF DERMATITIS  . escitalopram (LEXAPRO) 20 MG tablet TAKE 1 TABLET BY MOUTH EVERY DAY  . fluticasone (FLONASE) 50 MCG/ACT nasal spray SHAKE LIQUID AND USE 2 SPRAYS IN EACH NOSTRIL EVERY DAY  . hydrochlorothiazide (HYDRODIURIL) 25 MG tablet TAKE 1 TABLET(25 MG) BY MOUTH DAILY  . loratadine (CLARITIN) 10 MG tablet Take 10 mg by mouth daily.  . meloxicam (MOBIC) 15 MG tablet Take 15 mg by mouth daily.  . methocarbamol (ROBAXIN) 500 MG tablet Take 500 mg by mouth every 8 (eight) hours as needed.  . ondansetron (ZOFRAN) 4 MG tablet ondansetron HCl 4 mg tablet  TAKE 1 TABLET BY MOUTH EVERY DAY  . rosuvastatin (CRESTOR) 5 MG tablet TAKE 1 TABLET(5 MG) BY MOUTH DAILY  . traZODone (DESYREL) 100 MG tablet TAKE 1 TABLET(100 MG) BY MOUTH AT BEDTIME AS NEEDED FOR SLEEP  . calcipotriene (DOVONOX) 0.005 % ointment Apply topically 2 (two) times daily.  Marland Kitchen HYDROcodone-acetaminophen (NORCO/VICODIN) 5-325 MG tablet Take 1 tablet by mouth every 4 (four) hours as needed.  . predniSONE (DELTASONE) 20 MG tablet   . valACYclovir (VALTREX) 1000 MG tablet Take  1,000 mg by mouth 3 (three) times daily.   No facility-administered medications prior to visit.    Review of Systems  Genitourinary: Positive for frequency and urgency. Negative for flank pain and hematuria.    Last CBC Lab Results  Component Value Date   WBC 9.7 07/02/2019   HGB 14.4 07/02/2019   HCT 44.2 07/02/2019   MCV 96.3 07/02/2019   MCH 31.4 07/02/2019   RDW 12.5 07/02/2019   PLT 303 [id number]   Last metabolic panel Lab Results    Component Value Date   GLUCOSE 90 09/09/2019   NA 141 09/09/2019   K 4.0 09/09/2019   CL 101 09/09/2019   CO2 26 09/09/2019   BUN 15 09/09/2019   CREATININE 0.74 09/09/2019   GFRNONAA 84 09/09/2019   GFRAA 97 09/09/2019   CALCIUM 9.5 09/09/2019   PROT 6.7 09/09/2019   ALBUMIN 4.5 09/09/2019   LABGLOB 2.2 09/09/2019   AGRATIO 2.0 09/09/2019   BILITOT 0.3 09/09/2019   ALKPHOS 105 09/09/2019   AST 20 09/09/2019   ALT 27 09/09/2019   ANIONGAP 11 01/01/2019      Objective    BP 130/80 (BP Location: Left Arm, Patient Position: Sitting, Cuff Size: Normal)   Pulse 75   Temp (!) 97.3 F (36.3 C) (Temporal)   Resp 16   Wt 121 lb (54.9 kg)   BMI 22.86 kg/m  BP Readings from Last 3 Encounters:  10/15/19 130/80  09/14/19 118/68  07/09/19 (!) 150/84   Wt Readings from Last 3 Encounters:  10/15/19 121 lb (54.9 kg)  09/14/19 122 lb 12.8 oz (55.7 kg)  07/28/19 126 lb (57.2 kg)      Physical Exam Vitals reviewed.  Constitutional:      General: She is not in acute distress.    Appearance: Normal appearance. She is normal weight. She is not ill-appearing.  HENT:     Head: Normocephalic and atraumatic.  Eyes:     General: No scleral icterus. Cardiovascular:     Rate and Rhythm: Normal rate and regular rhythm.     Pulses: Normal pulses.     Heart sounds: Normal heart sounds.  Pulmonary:     Effort: Pulmonary effort is normal.     Breath sounds: Normal breath sounds. No wheezing.  Abdominal:     General: Abdomen is flat. Bowel sounds are normal.     Palpations: Abdomen is soft.     Tenderness: There is abdominal tenderness in the right lower quadrant, suprapubic area, left upper quadrant and left lower quadrant. There is no right CVA tenderness or left CVA tenderness.  Musculoskeletal:     Cervical back: Normal range of motion and neck supple.  Skin:    General: Skin is warm and dry.  Neurological:     Mental Status: She is alert and oriented to person, place, and  time.  Psychiatric:        Mood and Affect: Mood normal.        Behavior: Behavior normal.        Thought Content: Thought content normal.        Judgment: Judgment normal.       Results for orders placed or performed in visit on 10/15/19  POCT urinalysis dipstick  Result Value Ref Range   Color, UA Yellow    Clarity, UA Clear    Glucose, UA Negative Negative   Bilirubin, UA Negative    Ketones, UA Negative    Spec Grav, UA 1.010 1.010 -  1.025   Blood, UA Negative    pH, UA 6.5 5.0 - 8.0   Protein, UA Negative Negative   Urobilinogen, UA 0.2 0.2 or 1.0 E.U./dL   Nitrite, UA Negative    Leukocytes, UA Moderate (2+) (A) Negative   Appearance     Odor      Assessment & Plan     1. Acute cystitis without hematuria Worsening symptoms. UA positive. Will treat empirically with Keflex as below. Continue to push fluids. Urine sent for culture. Will follow up pending C&S results. She is to call if symptoms do not improve or if they worsen.  - cephALEXin (KEFLEX) 500 MG capsule; Take 1 capsule (500 mg total) by mouth 2 (two) times daily.  Dispense: 14 capsule; Refill: 0 - Urine Culture  2. Urinary frequency UA positive.  - POCT urinalysis dipstick  3. Incontinence in female Worse with UTI but always present.   4. Renal cyst, left Noted on MRI. Left appears to be more complex. Strong family history of multiple kidney disorders. Patient has h/o alcoholism. Has not drank in a long time. Referral to Nephrology placed below.  - Ambulatory referral to Nephrology   No follow-ups on file.      Reynolds Bowl, PA-C, have reviewed all documentation for this visit. The documentation on 10/15/19 for the exam, diagnosis, procedures, and orders are all accurate and complete.   Rubye Beach  [facility] [phone] (phone) [phone] (fax)  [place]

## 2019-10-15 NOTE — Telephone Encounter (Signed)
Needs Appointment

## 2019-10-15 NOTE — Telephone Encounter (Signed)
Copied from [place] [id number]. Topic: General - Other >> Oct 15, 2019 10:09 AM Leward Quan A wrote: Reason for CRM: Patient called to inquire of Geri [place]in[place] if it would be ok to come into the office today and leave a urine sample to be checked. Per patient she think she is starting to get a UTI with lower back pain, urgency of urination no relief after urinating and some burning. Patient can be reached at Ph# [phone]

## 2019-10-15 NOTE — Patient Instructions (Signed)
Urinary Tract Infection, Adult A urinary tract infection (UTI) is an infection of any part of the urinary tract. The urinary tract includes:  The kidneys.  The ureters.  The bladder.  The urethra. These organs make, store, and get rid of pee (urine) in the body. What are the causes? This is caused by germs (bacteria) in your genital area. These germs grow and cause swelling (inflammation) of your urinary tract. What increases the risk? You are more likely to develop this condition if:  You have a small, thin tube (catheter) to drain pee.  You cannot control when you pee or poop (incontinence).  You are female, and: ? You use these methods to prevent pregnancy:  A medicine that kills sperm (spermicide).  A device that blocks sperm (diaphragm). ? You have low levels of a female hormone (estrogen). ? You are pregnant.  You have genes that add to your risk.  You are sexually active.  You take antibiotic medicines.  You have trouble peeing because of: ? A prostate that is bigger than normal, if you are female. ? A blockage in the part of your body that drains pee from the bladder (urethra). ? A kidney stone. ? A nerve condition that affects your bladder (neurogenic bladder). ? Not getting enough to drink. ? Not peeing often enough.  You have other conditions, such as: ? Diabetes. ? A weak disease-fighting system (immune system). ? Sickle cell disease. ? Gout. ? Injury of the spine. What are the signs or symptoms? Symptoms of this condition include:  Needing to pee right away (urgently).  Peeing often.  Peeing small amounts often.  Pain or burning when peeing.  Blood in the pee.  Pee that smells bad or not like normal.  Trouble peeing.  Pee that is cloudy.  Fluid coming from the vagina, if you are female.  Pain in the belly or lower back. Other symptoms include:  Throwing up (vomiting).  No urge to eat.  Feeling mixed up (confused).  Being tired  and grouchy (irritable).  A fever.  Watery poop (diarrhea). How is this treated? This condition may be treated with:  Antibiotic medicine.  Other medicines.  Drinking enough water. Follow these instructions at home:  Medicines  Take over-the-counter and prescription medicines only as told by your doctor.  If you were prescribed an antibiotic medicine, take it as told by your doctor. Do not stop taking it even if you start to feel better. General instructions  Make sure you: ? Pee until your bladder is empty. ? Do not hold pee for a long time. ? Empty your bladder after sex. ? Wipe from front to back after pooping if you are a female. Use each tissue one time when you wipe.  Drink enough fluid to keep your pee pale yellow.  Keep all follow-up visits as told by your doctor. This is important. Contact a doctor if:  You do not get better after 1-2 days.  Your symptoms go away and then come back. Get help right away if:  You have very bad back pain.  You have very bad pain in your lower belly.  You have a fever.  You are sick to your stomach (nauseous).  You are throwing up. Summary  A urinary tract infection (UTI) is an infection of any part of the urinary tract.  This condition is caused by germs in your genital area.  There are many risk factors for a UTI. These include having a small, thin   tube to drain pee and not being able to control when you pee or poop.  Treatment includes antibiotic medicines for germs.  Drink enough fluid to keep your pee pale yellow. This information is not intended to replace advice given to you by your health care provider. Make sure you discuss any questions you have with your health care provider. Document Revised: 05/21/2018 Document Reviewed: 12/11/2017 Elsevier Patient Education  2020 Elsevier Inc.  

## 2019-10-17 LAB — URINE CULTURE

## 2019-10-18 ENCOUNTER — Telehealth: Payer: Self-pay

## 2019-10-18 NOTE — Telephone Encounter (Signed)
-----   Message from Mar Daring, [place] sent at 10/18/2019  9:20 AM EDT ----- Urine culture is positive for e.coli and is sensitive to the antibiotic you were placed on. Continue until completed.

## 2019-10-18 NOTE — Telephone Encounter (Signed)
Urine culture is positive for e.coli and is sensitive to the antibiotic you were placed on. Continue until completed.  Written by Mar Daring, PA-C on 10/18/2019 9:20 AM EDT Seen by patient [name] on 10/18/2019 9:44 AM EDT

## 2019-10-19 DIAGNOSIS — M542 Cervicalgia: Secondary | ICD-10-CM | POA: Diagnosis not present

## 2019-10-19 DIAGNOSIS — M545 Low back pain: Secondary | ICD-10-CM | POA: Diagnosis not present

## 2019-10-21 DIAGNOSIS — J3489 Other specified disorders of nose and nasal sinuses: Secondary | ICD-10-CM | POA: Diagnosis not present

## 2019-10-22 DIAGNOSIS — M542 Cervicalgia: Secondary | ICD-10-CM | POA: Diagnosis not present

## 2019-10-22 DIAGNOSIS — M545 Low back pain: Secondary | ICD-10-CM | POA: Diagnosis not present

## 2019-10-25 DIAGNOSIS — M5416 Radiculopathy, lumbar region: Secondary | ICD-10-CM | POA: Diagnosis not present

## 2019-11-04 ENCOUNTER — Encounter: Payer: Self-pay | Admitting: Otolaryngology

## 2019-11-04 ENCOUNTER — Other Ambulatory Visit: Payer: Self-pay

## 2019-11-04 DIAGNOSIS — M810 Age-related osteoporosis without current pathological fracture: Secondary | ICD-10-CM | POA: Diagnosis not present

## 2019-11-04 DIAGNOSIS — M542 Cervicalgia: Secondary | ICD-10-CM | POA: Diagnosis not present

## 2019-11-04 DIAGNOSIS — M545 Low back pain: Secondary | ICD-10-CM | POA: Diagnosis not present

## 2019-11-05 ENCOUNTER — Encounter: Payer: Self-pay | Admitting: Anesthesiology

## 2019-11-09 DIAGNOSIS — M542 Cervicalgia: Secondary | ICD-10-CM | POA: Diagnosis not present

## 2019-11-09 DIAGNOSIS — M545 Low back pain: Secondary | ICD-10-CM | POA: Diagnosis not present

## 2019-11-11 DIAGNOSIS — M8448XD Pathological fracture, other site, subsequent encounter for fracture with routine healing: Secondary | ICD-10-CM | POA: Diagnosis not present

## 2019-11-11 DIAGNOSIS — M4316 Spondylolisthesis, lumbar region: Secondary | ICD-10-CM | POA: Diagnosis not present

## 2019-11-11 DIAGNOSIS — M47812 Spondylosis without myelopathy or radiculopathy, cervical region: Secondary | ICD-10-CM | POA: Diagnosis not present

## 2019-11-12 ENCOUNTER — Other Ambulatory Visit: Payer: PPO | Attending: Otolaryngology

## 2019-11-14 IMAGING — CT CT CHEST LUNG CANCER SCREENING LOW DOSE W/O CM
2 of 5 series · 15 of 40 positions shown, 18 images · non-contrast
Comparison: None.

CLINICAL DATA: 67-year-old asymptomatic female current smoker with
49 pack-year smoking history.

EXAM:
CT CHEST WITHOUT CONTRAST LOW-DOSE FOR LUNG CANCER SCREENING
TECHNIQUE: Multidetector CT imaging of the chest was performed following the
standard protocol without IV contrast.

[Series 3: lung · axial · 0.58mm/px · z∈[-1236,-966]mm · 12 of 300 slices shown, 15 images (1 of 2)]
[im 15/300  mediastinal]
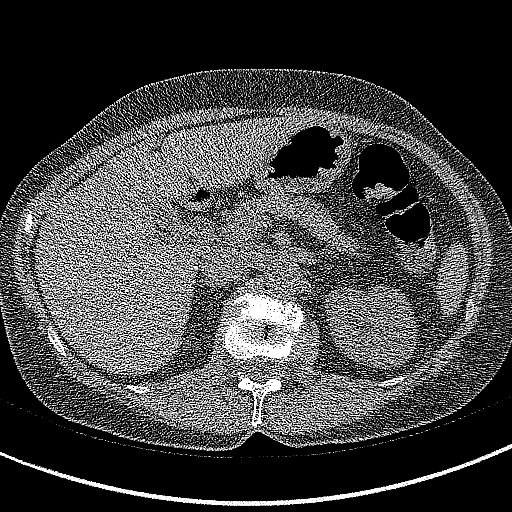
[im 15/300  lung]
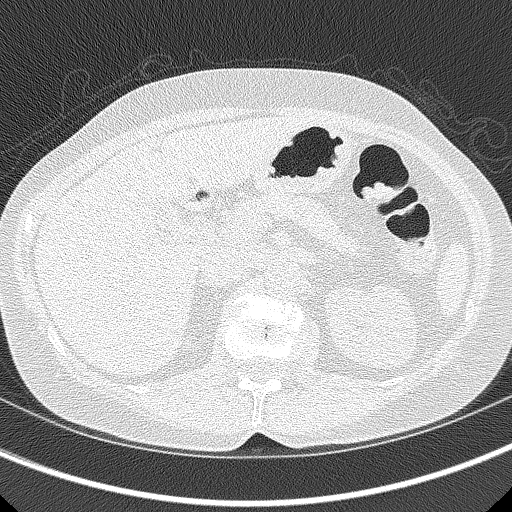
[im 43/300  lung]
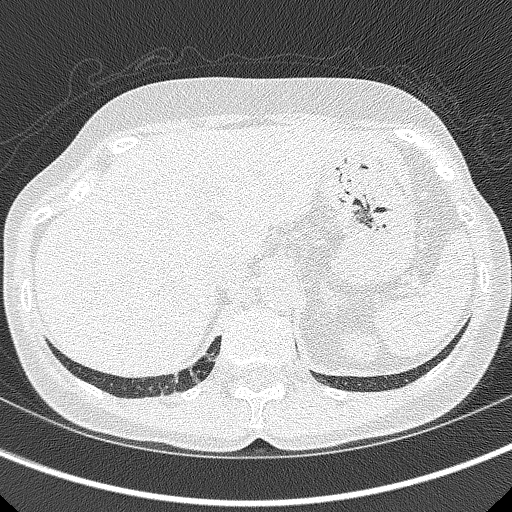
[im 72/300  lung]
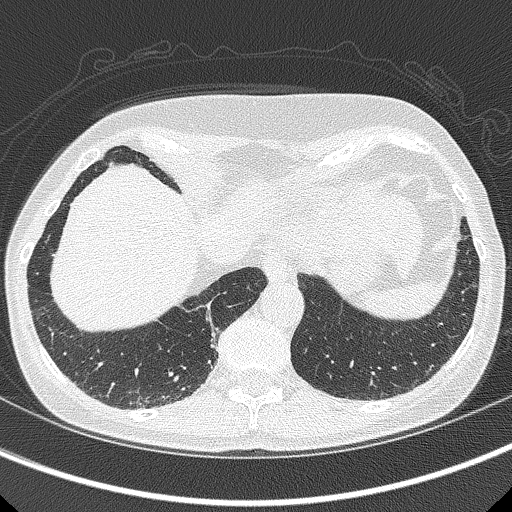
[im 86/300  lung]
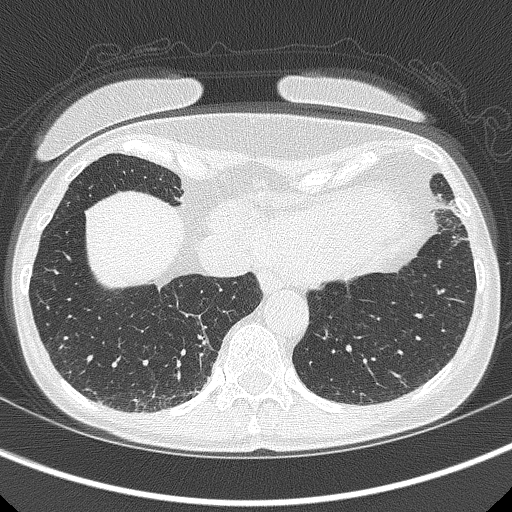
[im 114/300  mediastinal]
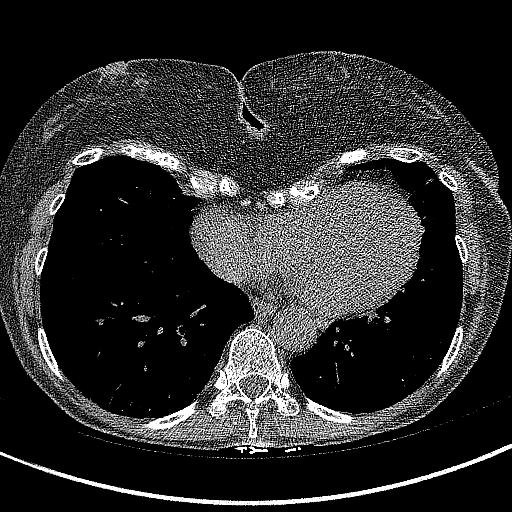
[im 114/300  lung]
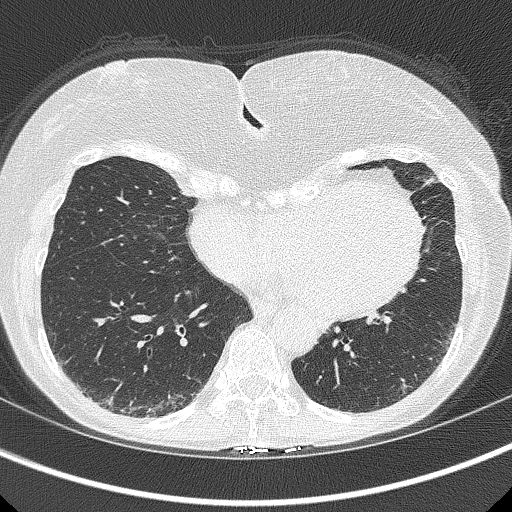
[im 143/300  lung]
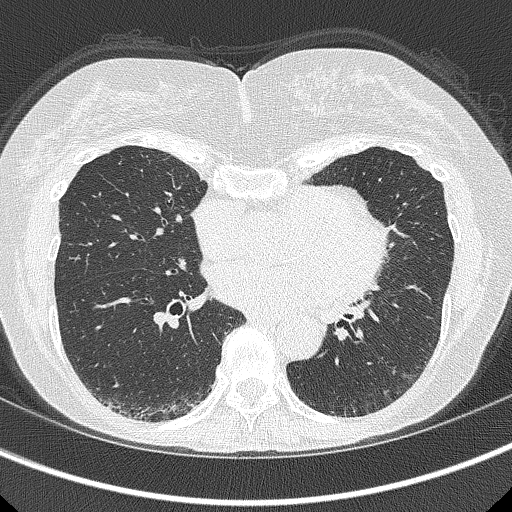
[im 157/300  lung]
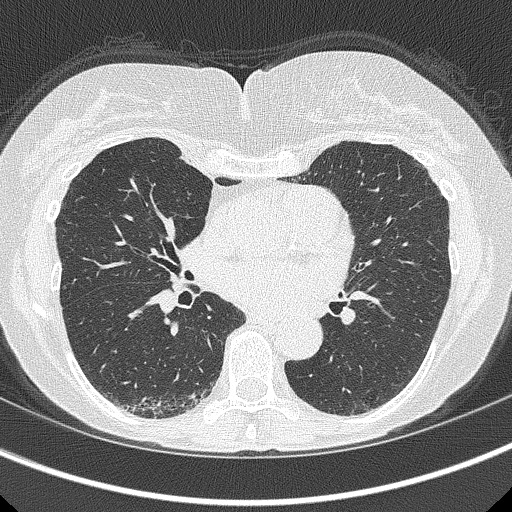
[im 186/300  lung]
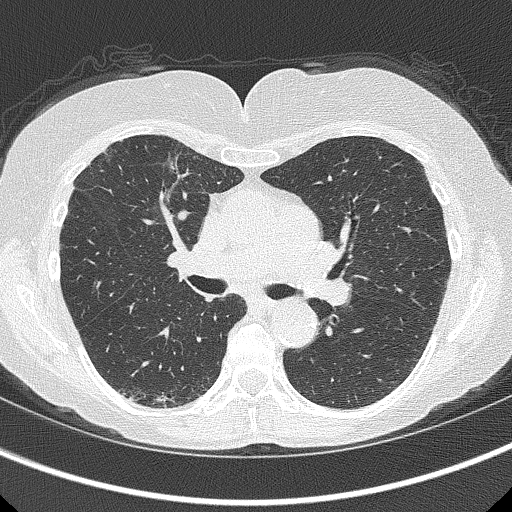
[im 214/300  mediastinal]
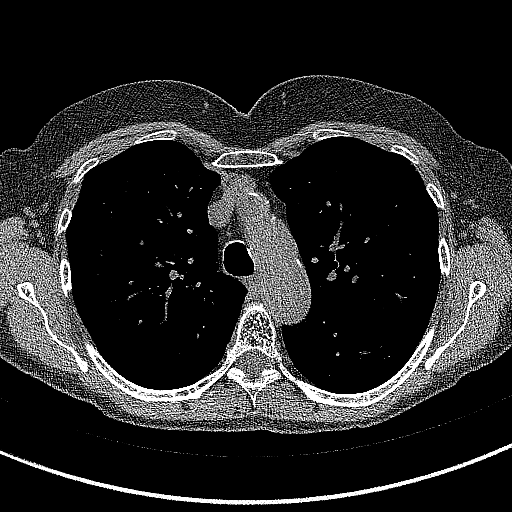
[im 214/300  lung]
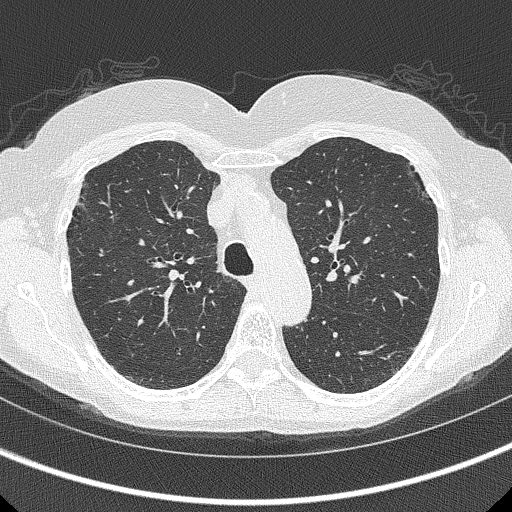
[im 228/300  lung]
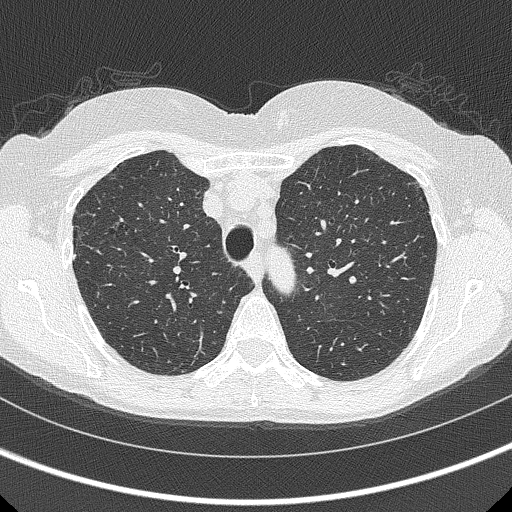
[im 257/300  lung]
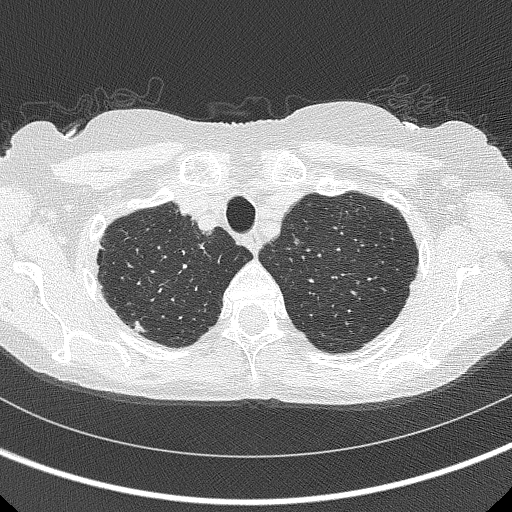
[im 285/300  lung]
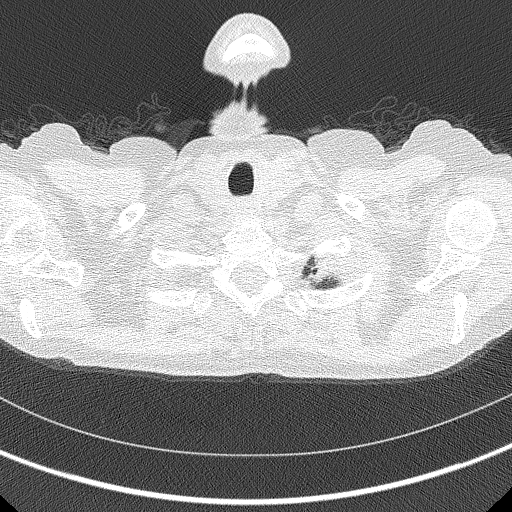

[Series 4: lung · coronal · 0.58mm/px · 3 of 231 slices shown (2 of 2)]
[im 47/231  lung]
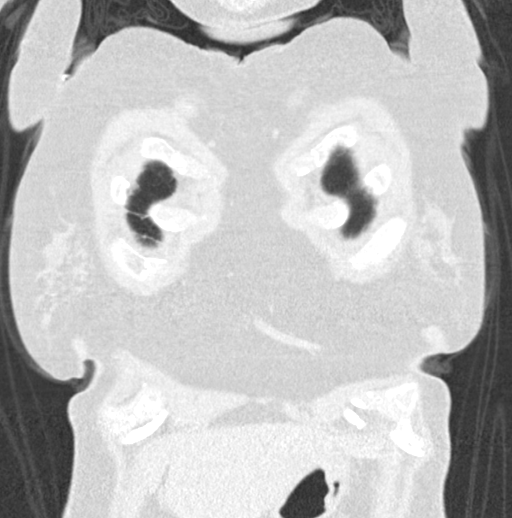
[im 93/231  lung]
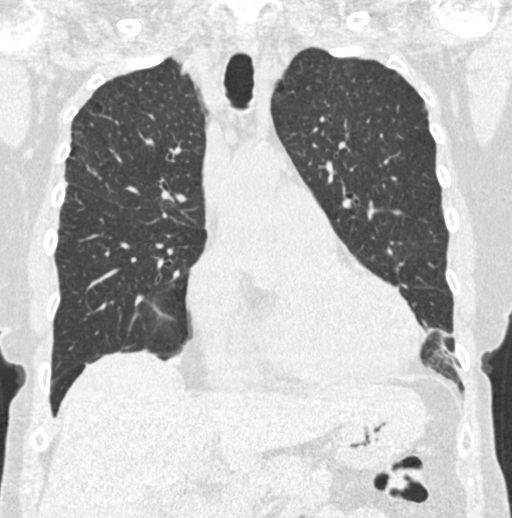
[im 139/231  lung]
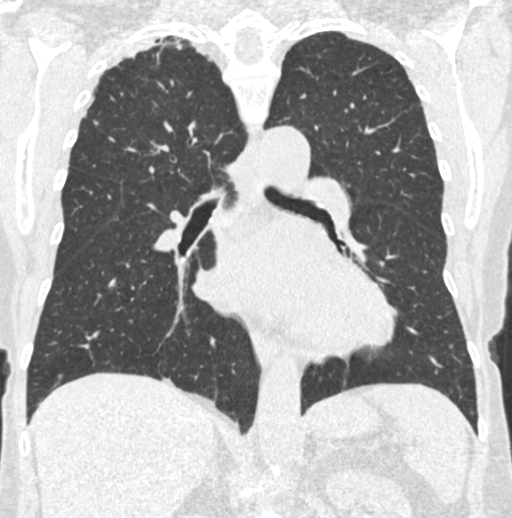

[15 of 40 positions shown; findings below may reference images not displayed]

FINDINGS: Cardiovascular: Normal heart size. No significant pericardial
effusion/thickening. Left anterior descending coronary
atherosclerosis. Atherosclerotic nonaneurysmal thoracic aorta.
Normal caliber pulmonary arteries.

Mediastinum/Nodes: No discrete thyroid nodules. Unremarkable
esophagus. No pathologically enlarged axillary, mediastinal or hilar
lymph nodes, noting limited sensitivity for the detection of hilar
adenopathy on this noncontrast study.

Lungs/Pleura: No pneumothorax. No pleural effusion. Mild
centrilobular and paraseptal emphysema with mild diffuse bronchial
wall thickening. No lung masses. Mildly thickened parenchymal bands
in basilar right upper lobe with mild surrounding patchy
ground-glass opacity suggests evolving
postinfectious/postinflammatory scarring. Few scattered tiny solid
pulmonary nodules, largest 3.4 mm in volume derived mean diameter in
the apical right upper lobe (series 3/image 35). Nonspecific mild
patchy subpleural reticulation in both lungs.

Upper abdomen: No acute abnormality.

Musculoskeletal: No aggressive appearing focal osseous lesions. Mild
thoracic spondylosis. Mild pectus excavatum deformity.
IMPRESSION: 1. Lung-RADS 2, benign appearance or behavior. Continue annual
screening with low-dose chest CT without contrast in 12 months.
2. One vessel coronary atherosclerosis.

Aortic Atherosclerosis (70NGP-M4D.D) and Emphysema (70NGP-S23.0).

## 2019-11-16 ENCOUNTER — Ambulatory Visit: Admission: RE | Admit: 2019-11-16 | Payer: PPO | Source: Home / Self Care | Admitting: Otolaryngology

## 2019-11-16 HISTORY — DX: Unspecified osteoarthritis, unspecified site: M19.90

## 2019-11-16 SURGERY — EXCISION, CONCHA BULLOSA, ENDOSCOPIC
Anesthesia: General | Laterality: Right

## 2019-11-24 DIAGNOSIS — Z79899 Other long term (current) drug therapy: Secondary | ICD-10-CM | POA: Diagnosis not present

## 2019-11-24 DIAGNOSIS — G894 Chronic pain syndrome: Secondary | ICD-10-CM | POA: Diagnosis not present

## 2019-11-24 DIAGNOSIS — M810 Age-related osteoporosis without current pathological fracture: Secondary | ICD-10-CM | POA: Diagnosis not present

## 2019-11-24 DIAGNOSIS — M5412 Radiculopathy, cervical region: Secondary | ICD-10-CM | POA: Diagnosis not present

## 2019-11-24 DIAGNOSIS — N281 Cyst of kidney, acquired: Secondary | ICD-10-CM | POA: Diagnosis not present

## 2019-11-24 DIAGNOSIS — R110 Nausea: Secondary | ICD-10-CM | POA: Diagnosis not present

## 2019-12-01 DIAGNOSIS — R809 Proteinuria, unspecified: Secondary | ICD-10-CM | POA: Insufficient documentation

## 2019-12-01 DIAGNOSIS — N281 Cyst of kidney, acquired: Secondary | ICD-10-CM | POA: Insufficient documentation

## 2019-12-01 DIAGNOSIS — I10 Essential (primary) hypertension: Secondary | ICD-10-CM | POA: Diagnosis not present

## 2019-12-02 ENCOUNTER — Other Ambulatory Visit: Payer: Self-pay | Admitting: Nephrology

## 2019-12-02 DIAGNOSIS — R809 Proteinuria, unspecified: Secondary | ICD-10-CM

## 2019-12-02 DIAGNOSIS — N281 Cyst of kidney, acquired: Secondary | ICD-10-CM

## 2019-12-02 DIAGNOSIS — I10 Essential (primary) hypertension: Secondary | ICD-10-CM

## 2019-12-02 DIAGNOSIS — G894 Chronic pain syndrome: Secondary | ICD-10-CM | POA: Diagnosis not present

## 2019-12-10 ENCOUNTER — Ambulatory Visit
Admission: RE | Admit: 2019-12-10 | Discharge: 2019-12-10 | Disposition: A | Discharge status: Home / Self Care | Payer: PPO | Source: Ambulatory Visit | Attending: Nephrology | Admitting: Nephrology

## 2019-12-10 ENCOUNTER — Other Ambulatory Visit: Payer: Self-pay

## 2019-12-10 DIAGNOSIS — N281 Cyst of kidney, acquired: Secondary | ICD-10-CM

## 2019-12-10 DIAGNOSIS — I10 Essential (primary) hypertension: Secondary | ICD-10-CM

## 2019-12-10 DIAGNOSIS — R809 Proteinuria, unspecified: Secondary | ICD-10-CM

## 2019-12-10 DIAGNOSIS — N289 Disorder of kidney and ureter, unspecified: Secondary | ICD-10-CM | POA: Diagnosis not present

## 2019-12-13 ENCOUNTER — Ambulatory Visit: Payer: PPO | Admitting: Urology

## 2019-12-13 ENCOUNTER — Other Ambulatory Visit: Payer: Self-pay

## 2019-12-13 ENCOUNTER — Encounter: Payer: Self-pay | Admitting: Urology

## 2019-12-13 VITALS — BP 115/72 | HR 67 | Ht 61.0 in | Wt 117.0 lb

## 2019-12-13 DIAGNOSIS — N2889 Other specified disorders of kidney and ureter: Secondary | ICD-10-CM

## 2019-12-13 NOTE — Progress Notes (Signed)
PATIENT ID: April Best, female     DOB: November 22, 1950, 69 y.o.     MRN: [id number]   ENCOUNTER: 12/13/19, 2:34 PM     REFERRING PROVIDER: Lavonia Dana, MD [address] [place],  [place] [address]  Chief Complaint  Patient presents with  . renal mass    HPI: April Best is a 69 y.o. female seen at the request of Dr. Juleen China for evaluation of an incidentally discovered renal mass.     08/13/2019 renal U/S revealed a minimally complex left renal cyst with a subcentimeter simple cyst on right kidney. 12/10/2019 CT abdomen and pelvis revealed a 2 cm indeterminate low-attenuation lesion in the left kidney. MRI recommended.   Was referred to nephrology who ordered a noncontrast abdominal/pelvis CT remarkable for a 2 cm low-attenuation lesion in the left lower pole not seen in 2008  Due to lack of contrast lesion considered indeterminate  Creatinine/GFR normal . Hx of increased urinary frequency . Hx of acute cystitis without hematuria. UA positive with e coli present 10/15/2019. Keflex 500 mg BID x7 days was prescribed.    PMHx: Past Medical History:  Diagnosis Date  . Allergy   . Annular oral lichen planus   . Anxiety   . Arthritis   . Depression   . Guaiac positive stools    history  . H. pylori infection   . 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 History of palpitations   . Hyperlipidemia   . Hypertension   . Migraine   . Osteoporosis   . Panic disorder   . Psoriasis     SURGICAL HISTORY: Past Surgical History:  Procedure Laterality Date  . ABDOMINAL HYSTERECTOMY  1983   Ovaries have been removed.  . ABDOMINOPLASTY    . APPENDECTOMY    . BREAST BIOPSY Right    neg- core  . COLONOSCOPY  03/2006  . COLONOSCOPY WITH PROPOFOL N/A 11/19/2017   Procedure: COLONOSCOPY WITH PROPOFOL;  Surgeon: Toledo, Benay Pike, MD;  Location: [facility] ENDOSCOPY;  Service: Gastroenterology;  Laterality: N/A;  . RHINOPLASTY  2002  . UPPER GI ENDOSCOPY  2008    HOME  MEDICATIONS:  Allergies as of 12/13/2019      Reactions   Augmentin  [amoxicillin-pot Clavulanate] Nausea And Vomiting   as stated (XR).   Lisinopril Cough      Medication List       Accurate as of December 13, 2019  2:34 PM. If you have any questions, ask your nurse or doctor.        amLODipine 5 MG tablet Commonly known as: NORVASC Take 5 mg by mouth daily. What changed: Another medication with the same name was removed. Continue taking this medication, and follow the directions you see here. Changed by: [name], MD   azelastine 0.1 % nasal spray Commonly known as: ASTELIN azelastine 137 mcg (0.1 %) nasal spray aerosol   calcitonin (salmon) 200 UNIT/ACT nasal spray Commonly known as: MIACALCIN/FORTICAL 2 sprays daily.   CALCIUM PO Take by mouth.   clobetasol ointment 0.05 % Commonly known as: TEMOVATE APP TOPICALLY TO GLUTEAL AREA ONCE D PRF DERMATITIS   escitalopram 20 MG tablet Commonly known as: LEXAPRO TAKE 1 TABLET BY MOUTH EVERY DAY   fluticasone 50 MCG/ACT nasal spray Commonly known as: FLONASE SHAKE LIQUID AND USE 2 SPRAYS IN EACH NOSTRIL EVERY DAY   gabapentin 100 MG capsule Commonly known as: NEURONTIN Take 100 mg by mouth 2 (two) times daily.  hydrochlorothiazide 25 MG tablet Commonly known as: HYDRODIURIL TAKE 1 TABLET(25 MG) BY MOUTH DAILY   HYDROcodone-acetaminophen 7.5-325 MG tablet Commonly known as: NORCO Take 1 tablet by mouth 3 (three) times daily.   loratadine 10 MG tablet Commonly known as: CLARITIN Take 10 mg by mouth daily.   meloxicam 15 MG tablet Commonly known as: MOBIC Take 15 mg by mouth daily.   methocarbamol 500 MG tablet Commonly known as: ROBAXIN Take 500 mg by mouth every 8 (eight) hours as needed.   ondansetron 4 MG tablet Commonly known as: ZOFRAN ondansetron HCl 4 mg tablet  TAKE 1 TABLET BY MOUTH EVERY DAY   oxyCODONE-acetaminophen 10-325 MG tablet Commonly known as: PERCOCET Take 1 tablet by mouth  every 4 (four) hours as needed for pain.   rosuvastatin 5 MG tablet Commonly known as: CRESTOR TAKE 1 TABLET(5 MG) BY MOUTH DAILY   traZODone 100 MG tablet Commonly known as: DESYREL TAKE 1 TABLET(100 MG) BY MOUTH AT BEDTIME AS NEEDED FOR SLEEP   Turmeric-Ginger 150-25 MG Chew Chew by mouth.   VITAMIN B-12 PO Take by mouth daily.   VITAMIN D PO Take by mouth daily.       ALLERGIES: Allergies  Allergen Reactions  . Augmentin  [Amoxicillin-Pot Clavulanate] Nausea And Vomiting    as stated (XR).  Marland Kitchen Lisinopril Cough    FAMILY HISTORY: Family History  Problem Relation Age of Onset  . Hypertension Mother   . Hyperlipidemia Mother   . Hypothyroidism Mother   . Fibromyalgia Mother   . Lung cancer Father   . Coronary artery disease Father   . Hyperlipidemia Father   . Hypertension Father   . Heart attack Father   . Hyperlipidemia Sister   . Hypertension Sister   . Fibromyalgia Sister   . ADD / ADHD Brother   . Hemochromatosis Sister   . Breast cancer Neg Hx     SOCIAL HISTORY:  reports that she has been smoking cigarettes. She has a 24.50 pack-year smoking history. She has never used smokeless tobacco. She reports previous alcohol use. She reports that she does not use drugs.  PHYSICAL EXAM: BP 115/72 (BP Location: Left Arm, Patient Position: Sitting, Cuff Size: Normal)   Pulse 67   Ht 5\' 1"  (1.549 m)   Wt 117 lb (53.1 kg)   BMI 22.11 kg/m   Constitutional:  Alert and oriented, No acute distress. HEENT: [place] AT, moist mucus membranes.  Trachea midline, no masses. Cardiovascular: No clubbing, cyanosis, or edema. Respiratory: Normal respiratory effort, no increased work of breathing. Skin: No rashes, bruises or suspicious lesions. Neurologic: Grossly intact, no focal deficits, moving all 4 extremities. Psychiatric: Normal mood and affect.   Pertinent Imaging: CT personally reviewed   ASSESSMENT/PLAN: 1.  Left renal mass  Small 2 cm incidental left renal  mass  Needs a renal mass protocol study to determine if enhancing  Based on small size and recommendation of surveillance even if enhancing will schedule renal mass protocol MRI in approximately 4 months.   [name], [place] [address], [place], [place] [address] [phone]  By signing my name below, I, [employer], attest that this documentation has been prepared under the direction and in the presence of John Giovanni, MD. Electronically Signed: Franchot Erichsen 12/13/19, 2:34 PM   I have reviewed the above documentation for accuracy and completeness, and I agree with the above.   [name], MD

## 2019-12-15 ENCOUNTER — Telehealth: Payer: Self-pay

## 2019-12-15 ENCOUNTER — Encounter: Payer: Self-pay | Admitting: Urology

## 2019-12-15 NOTE — Telephone Encounter (Signed)
Copied from [place] [id number]. Topic: General - Other >> Dec 15, 2019  1:22 PM April Best wrote: Reason for CRM: Pt had blood work done with Dr. Assunta Gambles office at [facility] / Pt is concerned about some of the results but can not get a response from the other providers office/ please advise April Best can go over results with her

## 2019-12-16 NOTE — Telephone Encounter (Signed)
She is going to have to wait for Dr. Juleen China. I am unsure how to interpret those labs.

## 2019-12-16 NOTE — Telephone Encounter (Signed)
Advised patient as below.  

## 2019-12-22 DIAGNOSIS — R809 Proteinuria, unspecified: Secondary | ICD-10-CM | POA: Diagnosis not present

## 2019-12-31 ENCOUNTER — Inpatient Hospital Stay: Payer: PPO | Attending: Internal Medicine

## 2019-12-31 ENCOUNTER — Other Ambulatory Visit: Payer: Self-pay

## 2019-12-31 DIAGNOSIS — Z801 Family history of malignant neoplasm of trachea, bronchus and lung: Secondary | ICD-10-CM | POA: Insufficient documentation

## 2019-12-31 DIAGNOSIS — I10 Essential (primary) hypertension: Secondary | ICD-10-CM | POA: Diagnosis not present

## 2019-12-31 DIAGNOSIS — R809 Proteinuria, unspecified: Secondary | ICD-10-CM | POA: Diagnosis not present

## 2019-12-31 DIAGNOSIS — F17210 Nicotine dependence, cigarettes, uncomplicated: Secondary | ICD-10-CM | POA: Insufficient documentation

## 2019-12-31 DIAGNOSIS — Z90710 Acquired absence of both cervix and uterus: Secondary | ICD-10-CM | POA: Diagnosis not present

## 2019-12-31 DIAGNOSIS — F1021 Alcohol dependence, in remission: Secondary | ICD-10-CM | POA: Diagnosis not present

## 2019-12-31 DIAGNOSIS — M1990 Unspecified osteoarthritis, unspecified site: Secondary | ICD-10-CM | POA: Insufficient documentation

## 2019-12-31 DIAGNOSIS — E279 Disorder of adrenal gland, unspecified: Secondary | ICD-10-CM | POA: Insufficient documentation

## 2019-12-31 DIAGNOSIS — N281 Cyst of kidney, acquired: Secondary | ICD-10-CM | POA: Diagnosis not present

## 2019-12-31 DIAGNOSIS — M2550 Pain in unspecified joint: Secondary | ICD-10-CM | POA: Insufficient documentation

## 2019-12-31 LAB — CBC WITH DIFFERENTIAL/PLATELET
Abs Immature Granulocytes: 0.06 10*3/uL (ref 0.00–0.07)
Basophils Absolute: 0.1 10*3/uL (ref 0.0–0.1)
Basophils Relative: 1 %
Eosinophils Absolute: 0.3 10*3/uL (ref 0.0–0.5)
Eosinophils Relative: 3 %
HCT: 45.9 % (ref 36.0–46.0)
Hemoglobin: 15.4 g/dL — ABNORMAL HIGH (ref 12.0–15.0)
Immature Granulocytes: 1 %
Lymphocytes Relative: 28 %
Lymphs Abs: 2.6 10*3/uL (ref 0.7–4.0)
MCH: 32.7 pg (ref 26.0–34.0)
MCHC: 33.6 g/dL (ref 30.0–36.0)
MCV: 97.5 fL (ref 80.0–100.0)
Monocytes Absolute: 0.6 10*3/uL (ref 0.1–1.0)
Monocytes Relative: 6 %
Neutro Abs: 5.7 10*3/uL (ref 1.7–7.7)
Neutrophils Relative %: 61 %
Platelets: 299 10*3/uL (ref 150–400)
RBC: 4.71 MIL/uL (ref 3.87–5.11)
RDW: 12.5 % (ref 11.5–15.5)
WBC: 9.3 10*3/uL (ref 4.0–10.5)
nRBC: 0 % (ref 0.0–0.2)

## 2019-12-31 LAB — IRON AND TIBC
Iron: 85 ug/dL (ref 28–170)
Saturation Ratios: 30 % (ref 10.4–31.8)
TIBC: 286 ug/dL (ref 250–450)
UIBC: 201 ug/dL

## 2019-12-31 LAB — FERRITIN: Ferritin: 73 ng/mL (ref 11–307)

## 2020-01-03 DIAGNOSIS — M5416 Radiculopathy, lumbar region: Secondary | ICD-10-CM | POA: Diagnosis not present

## 2020-01-03 DIAGNOSIS — M47812 Spondylosis without myelopathy or radiculopathy, cervical region: Secondary | ICD-10-CM | POA: Diagnosis not present

## 2020-01-03 DIAGNOSIS — I10 Essential (primary) hypertension: Secondary | ICD-10-CM | POA: Diagnosis not present

## 2020-01-04 ENCOUNTER — Other Ambulatory Visit: Payer: Self-pay | Admitting: Physician Assistant

## 2020-01-04 DIAGNOSIS — F410 Panic disorder [episodic paroxysmal anxiety] without agoraphobia: Secondary | ICD-10-CM

## 2020-01-04 DIAGNOSIS — E7800 Pure hypercholesterolemia, unspecified: Secondary | ICD-10-CM

## 2020-01-04 NOTE — Telephone Encounter (Signed)
Requested Prescriptions  Pending Prescriptions Disp Refills  . escitalopram (LEXAPRO) 20 MG tablet [Pharmacy Med Name: ESCITALOPRAM 20MG  TABLETS] 90 tablet 1    Sig: TAKE 1 TABLET BY MOUTH EVERY DAY     Psychiatry:  Antidepressants - SSRI Passed - 01/04/2020  6:21 AM      Passed - Completed PHQ-2 or PHQ-9 in the last 360 days.      Passed - Valid encounter within last 6 months    Recent Outpatient Visits          2 months ago Acute cystitis without hematuria   [place], [place]   3 months ago Annual physical exam   [facility] Carles Collet M, [place]   3 months ago Age-related osteoporosis without current pathological fracture   [facility] [place], Dionne Bucy, MD   5 months ago Neck pain   [facility] Trinna Post, [place]   1 year ago [place], Wendee Beavers, PA-C      Future Appointments            In 3 days Cammie Sickle, MD [facility]           . rosuvastatin (CRESTOR) 5 MG tablet [Pharmacy Med Name: ROSUVASTATIN 5MG  TABLETS] 90 tablet 1    Sig: TAKE 1 TABLET(5 MG) BY MOUTH DAILY     Cardiovascular:  Antilipid - Statins Failed - 01/04/2020  6:21 AM      Failed - Total Cholesterol in normal range and within 360 days    Cholesterol, Total  Date Value Ref Range Status  09/09/2019 217 (H) 100 - 199 mg/dL Final         Failed - LDL in normal range and within 360 days    LDL Chol Calc (NIH)  Date Value Ref Range Status  09/09/2019 144 (H) 0 - 99 mg/dL Final         Passed - HDL in normal range and within 360 days    HDL  Date Value Ref Range Status  09/09/2019 53 >39 mg/dL Final         Passed - Triglycerides in normal range and within 360 days    Triglycerides  Date Value Ref Range Status  09/09/2019 110 0 - 149 mg/dL Final         Passed - Patient is not pregnant      Passed - Valid  encounter within last 12 months    Recent Outpatient Visits          2 months ago Acute cystitis without hematuria   [place], Clearnce Sorrel, PA-C   3 months ago Annual physical exam   [place], [place], PA-C   3 months ago Age-related osteoporosis without current pathological fracture   [facility] [place], Dionne Bucy, MD   5 months ago Neck pain   [facility] Trinna Post, [place]   1 year ago [place], Wendee Beavers, [place]      Future Appointments            In 3 days Cammie Sickle, MD [facility]

## 2020-01-06 ENCOUNTER — Encounter: Payer: Self-pay | Admitting: Internal Medicine

## 2020-01-07 ENCOUNTER — Inpatient Hospital Stay: Payer: PPO | Admitting: Internal Medicine

## 2020-01-07 ENCOUNTER — Inpatient Hospital Stay: Payer: PPO

## 2020-01-07 ENCOUNTER — Other Ambulatory Visit: Payer: Self-pay

## 2020-01-07 ENCOUNTER — Encounter: Payer: Self-pay | Admitting: Internal Medicine

## 2020-01-07 NOTE — Assessment & Plan Note (Addendum)
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  Patient continues to be asymptomatic. STABLE  #Do not recommend phlebotomy unless ferritin greater than 150 or iron saturation greater than 50%.  No phlebotomy today.  # pain sec to arthritis [not related to hemochromatosis] MSK vs autoimmune disorder [awaiting eval with Dr.Patel;KC; Rheum]; followed by ortho.   # Incidental adrenal mass [s/p eval endo; Dr.O'Connell- stable.   #History of alcoholism -again congratulated patient for being sober; [last quit 2019]  Plan: # No phlebotomy today # 6 months with labs (CBC, iron studies, ferritin) 1 week prior, MD/ possible phlebotomy- Dr.B

## 2020-01-07 NOTE — Progress Notes (Signed)
April Best OFFICE PROGRESS NOTE  Best Care Team: Paulene Floor as PCP - General (Physician Assistant) Cammie Sickle, MD as Consulting Physician (Internal Medicine) Nadene Rubins, DO as Referring Physician (Physical Medicine and Rehabilitation) Lonia Farber, MD as Consulting Physician (Internal Medicine)   SUMMARY OF HEMATOLOGIC/ONCOLOGIC HISTORY:  # April 2015- HEMOCHROMATOSIS [screening- compound Heterozygous C282y & H63d] on Phlebotomy as needed-ferritin greater than 150/saturation given 35  #History of alcoholism-quit 2019  INTERVAL HISTORY:  A very pleasant 69 year old female Best with above history of  [name] following screening  without any end organ dysfunction is here for follow-up /phlebotomy.   In April interim Best was diagnosed with adrenal mass on recent imaging.  However further work-up negative for malignancy.  Also concerned about autoimmune markers-awaiting further evaluation with rheumatology.  Best complains of joint pains bilaterally. Review of Systems  Constitutional: Positive for malaise/fatigue. Negative for chills, diaphoresis, fever and weight loss.  HENT: Negative for nosebleeds and sore throat.   Eyes: Negative for double vision.  Respiratory: Negative for cough, hemoptysis, sputum production, shortness of breath and wheezing.   Cardiovascular: Negative for chest pain, palpitations, orthopnea and leg swelling.  Gastrointestinal: Negative for abdominal pain, blood in stool, constipation, diarrhea, heartburn, melena, nausea and vomiting.  Genitourinary: Negative for dysuria, frequency and urgency.  Musculoskeletal: Positive for back pain, joint pain, myalgias and neck pain.  Skin: Negative.  Negative for itching and rash.  Neurological: Negative for dizziness, tingling, focal weakness, weakness and headaches.  Endo/Heme/Allergies: Does not bruise/bleed easily.  Psychiatric/Behavioral:  Negative for depression. April Best is not nervous/anxious and does not have insomnia.       PAST MEDICAL HISTORY :  Past Medical History:  Diagnosis Date  . Allergy   . Annular oral lichen planus   . Anxiety   . Arthritis   . Depression   . Guaiac positive stools    history  . H. pylori infection   . 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 History of palpitations   . Hyperlipidemia   . Hypertension   . Migraine   . Osteoporosis   . Panic disorder   . Psoriasis     PAST SURGICAL HISTORY :   Past Surgical History:  Procedure Laterality Date  . ABDOMINAL HYSTERECTOMY  1983   Ovaries have been removed.  . ABDOMINOPLASTY    . APPENDECTOMY    . BREAST BIOPSY Right    neg- core  . COLONOSCOPY  03/2006  . COLONOSCOPY WITH PROPOFOL N/A 11/19/2017   Procedure: COLONOSCOPY WITH PROPOFOL;  Surgeon: Toledo, Benay Pike, MD;  Location: [facility] END[facility];  Service: Gastroenterology;  Laterality: N/A;  . RHINOPLASTY  2002  . UPPER GI ENDOSCOPY  2008    FAMILY HISTORY :   Family History  Problem Relation Age of Onset  . Hypertension Mother   . Hyperlipidemia Mother   . Hypothyroidism Mother   . Fibromyalgia Mother   . Lung cancer Father   . Coronary artery disease Father   . Hyperlipidemia Father   . Hypertension Father   . Heart attack Father   . Hyperlipidemia Sister   . Hypertension Sister   . Fibromyalgia Sister   . ADD / ADHD Brother   . Hemochromatosis Sister   . Breast cancer Neg Hx     SOCIAL HISTORY:   Social History   Tobacco Use  . Smoking status: Current Every Day Smoker    Packs/day: 0.50    Years: 49.00  Pack years: 24.50    Types: Cigarettes  . Smokeless tobacco: Never Used  . Tobacco comment: pt declines smoking cessation ( smoked since age 8)  Vaping Use  . Vaping Use: Never used  Substance Use Topics  . Alcohol use: Not Currently    Alcohol/week: 0.0 standard drinks  . Drug use: No    ALLERGIES:  is allergic to augmentin  [amoxicillin-pot  clavulanate] and lisinopril.  MEDICATIONS:  Current Outpatient Medications  Medication Sig Dispense Refill  . amLODipine (NORVASC) 5 MG tablet Take 5 mg by mouth daily.    Marland Kitchen azelastine (ASTELIN) 0.1 % nasal spray azelastine 137 mcg (0.1 %) nasal spray aerosol    . calcitonin, salmon, (MIACALCIN/FORTICAL) 200 UNIT/ACT nasal spray 2 sprays daily.    Marland Kitchen CALCIUM PO Take by mouth.    . clobetasol ointment (TEMOVATE) 0.05 % APP TOPICALLY TO GLUTEAL AREA ONCE D PRF DERMATITIS 30 g 1  . Cyanocobalamin (VITAMIN B-12 PO) Take by mouth daily.    Marland Kitchen escitalopram (LEXAPRO) 20 MG tablet TAKE 1 TABLET BY MOUTH EVERY DAY 90 tablet 0  . fluticasone (FLONASE) 50 MCG/ACT nasal spray SHAKE LIQUID AND USE 2 SPRAYS IN EACH NOSTRIL EVERY DAY 16 g 0  . gabapentin (NEURONTIN) 100 MG capsule Take 100 mg by mouth 2 (two) times daily.    . hydrochlorothiazide (HYDRODIURIL) 25 MG tablet TAKE 1 TABLET(25 MG) BY MOUTH DAILY 90 tablet 1  . HYDROcodone-acetaminophen (NORCO) 7.5-325 MG tablet Take 1 tablet by mouth 3 (three) times daily.    Marland Kitchen loratadine (CLARITIN) 10 MG tablet Take 10 mg by mouth daily.    Marland Kitchen losartan (COZAAR) 25 MG tablet Take 25 mg by mouth daily.    . meloxicam (MOBIC) 15 MG tablet Take 15 mg by mouth daily.    . methocarbamol (ROBAXIN) 500 MG tablet Take 500 mg by mouth every 8 (eight) hours as needed.    . ondansetron (ZOFRAN) 4 MG tablet ondansetron HCl 4 mg tablet  TAKE 1 TABLET BY MOUTH EVERY DAY    . oxyCODONE-acetaminophen (PERCOCET) 10-325 MG tablet Take 1 tablet by mouth every 4 (four) hours as needed for pain.    . rosuvastatin (CRESTOR) 5 MG tablet TAKE 1 TABLET(5 MG) BY MOUTH DAILY 90 tablet 3  . traZODone (DESYREL) 100 MG tablet TAKE 1 TABLET(100 MG) BY MOUTH AT BEDTIME AS NEEDED FOR SLEEP 30 tablet 0  . Turmeric-Ginger 150-25 MG CHEW Chew by mouth.    Marland Kitchen VITAMIN D PO Take by mouth daily.     No current facility-administered medications for this visit.    PHYSICAL EXAMINATION: ECOG  PERFORMANCE STATUS: 0 - Asymptomatic  BP 124/75 (BP Location: Left Arm, Best Position: Sitting, Cuff Size: Normal)   Pulse 67   Temp 98.7 F (37.1 C) (Tympanic)   Resp 16   Ht 5\' 1"  (1.549 m)   Wt 116 lb (52.6 kg)   SpO2 98%   BMI 21.92 kg/m   Filed Weights   01/07/20 1052  Weight: 116 lb (52.6 kg)    Physical Exam HENT:     Head: Normocephalic and atraumatic.     Mouth/Throat:     Pharynx: No oropharyngeal exudate.  Eyes:     Pupils: Pupils are equal, round, and reactive to light.  Cardiovascular:     Rate and Rhythm: Normal rate and regular rhythm.  Pulmonary:     Effort: No respiratory distress.     Breath sounds: No wheezing.  Abdominal:  General: Bowel sounds are normal. There is no distension.     Palpations: Abdomen is soft. There is no mass.     Tenderness: There is no abdominal tenderness. There is no guarding or rebound.  Musculoskeletal:        General: No tenderness. Normal range of motion.     Cervical back: Normal range of motion and neck supple.  Skin:    General: Skin is warm.  Neurological:     Mental Status: April Best is alert and oriented to person, place, and time.  Psychiatric:        Mood and Affect: Affect normal.      LABORATORY DATA:  I have reviewed April data as listed    Component Value Date/Time   NA 141 09/09/2019 1005   K 4.0 09/09/2019 1005   CL 101 09/09/2019 1005   CO2 26 09/09/2019 1005   GLUCOSE 90 09/09/2019 1005   GLUCOSE 92 01/01/2019 1324   BUN 15 09/09/2019 1005   CREATININE 0.74 09/09/2019 1005   CREATININE 0.57 05/05/2017 1653   CALCIUM 9.5 09/09/2019 1005   PROT 6.7 09/09/2019 1005   ALBUMIN 4.5 09/09/2019 1005   AST 20 09/09/2019 1005   ALT 27 09/09/2019 1005   ALKPHOS 105 09/09/2019 1005   BILITOT 0.3 09/09/2019 1005   GFRNONAA 84 09/09/2019 1005   GFRNONAA 97 05/05/2017 1653   GFRAA 97 09/09/2019 1005   GFRAA 113 05/05/2017 1653    No results found for: SPEP, UPEP  Lab Results  Component Value  Date   WBC 9.3 12/31/2019   NEUTROABS 5.7 12/31/2019   HGB 15.4 (H) 12/31/2019   HCT 45.9 12/31/2019   MCV 97.5 12/31/2019   PLT 299 12/31/2019      Chemistry      Component Value Date/Time   NA 141 09/09/2019 1005   K 4.0 09/09/2019 1005   CL 101 09/09/2019 1005   CO2 26 09/09/2019 1005   BUN 15 09/09/2019 1005   CREATININE 0.74 09/09/2019 1005   CREATININE 0.57 05/05/2017 1653   GLU 102 09/05/2014 0000      Component Value Date/Time   CALCIUM 9.5 09/09/2019 1005   ALKPHOS 105 09/09/2019 1005   AST 20 09/09/2019 1005   ALT 27 09/09/2019 1005   BILITOT 0.3 09/09/2019 1005       ASSESSMENT & PLAN:   [name] ([place]) # HEREDITARY  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  Best continues to be asymptomatic. STABLE  #Do not recommend phlebotomy unless ferritin greater than 150 or iron saturation greater than 50%.  No phlebotomy today.  # pain sec to arthritis [not related to hemochromatosis] MSK vs autoimmune disorder [awaiting eval with Dr.Patel;KC; Rheum]; followed by ortho.   # Incidental adrenal mass [s/p eval endo; Dr.O'Connell- stable.   #History of alcoholism -again congratulated Best for being sober; [last quit 2019]  Plan: # No phlebotomy today # 6 months with labs (CBC, iron studies, ferritin) 1 week prior, MD/ possible phlebotomy- Dr.B      Cammie Sickle, MD 01/09/2020 5:22 PM

## 2020-01-13 DIAGNOSIS — N281 Cyst of kidney, acquired: Secondary | ICD-10-CM | POA: Diagnosis not present

## 2020-01-13 DIAGNOSIS — I10 Essential (primary) hypertension: Secondary | ICD-10-CM | POA: Diagnosis not present

## 2020-01-13 DIAGNOSIS — R809 Proteinuria, unspecified: Secondary | ICD-10-CM | POA: Diagnosis not present

## 2020-01-17 ENCOUNTER — Telehealth: Payer: Self-pay

## 2020-01-17 NOTE — Telephone Encounter (Signed)
Copied from [place] [id number]. Topic: Appointment Scheduling - Scheduling Inquiry for Clinic >> Jan 17, 2020  8:22 AM Scherrie Gerlach wrote: Reason for CRM: pt states Dr Virgia Land prescribed her abx for her sinus infection several months ago.  She started it, but did not finish the abx because it gave her C-diff.  Pt felt like she was getting a sinus infection last week, so thought she would try to take the abx again.  However, now she says she has C-dif again.  Pt states she cannot leave the house, she is having to wear protection, because of the c-diff.  So UC is not an option. Pt is hoping [place] or someone can do a virtual visit today, or if Fabio Bering will call her in a prescription for the c-diff. Please advise

## 2020-01-17 NOTE — Telephone Encounter (Signed)
Called patient and no answer, eft voicemail message for patient to return call, if patient calls back okay for PEC to advise of message.

## 2020-01-17 NOTE — Telephone Encounter (Signed)
Patient returned call to the [facility] and they scheduled appointment for tomorrow 01/18/2020 @ 11:20 AM

## 2020-01-17 NOTE — Telephone Encounter (Signed)
She will need a visit. She would also need to give a stool sample. Can be virtual but it would be best to drop off a stool sample as well

## 2020-01-17 NOTE — Progress Notes (Signed)
Established patient visit  I,April Miller,acting as a scribe for Trinna Post, PA-C.,have documented all relevant documentation on the behalf of Trinna Post, PA-C,as directed by  Trinna Post, PA-C while in the presence of Trinna Post, PA-C.   Patient: April Best   DOB: 04/26/1951   69 y.o. Female  MRN: [id number] Visit Date: 01/18/2020  Today's healthcare provider: Trinna Post, PA-C   Chief Complaint  Patient presents with  . Diarrhea   Subjective    Diarrhea  This is a new problem. The current episode started 1 to 4 weeks ago (2 weeks). The problem occurs 2 to 4 times per day. The problem has been waxing and waning. The stool consistency is described as mucous, bloody and blood tinged. The patient states that diarrhea awakens her from sleep. Associated symptoms include abdominal pain, arthralgias, bloating, increased flatus, myalgias and weight loss. Pertinent negatives include no chills, coughing, fever, headaches, sweats, URI or vomiting. Nothing (any food and drink) aggravates the symptoms. Risk factors include recent antibiotic use. Treatments tried: liquid IV (supplement), Zofran. The treatment provided mild relief.    Patient is here concerning diarrhea she has had for 2 weeks. Patient was given cefdinir for sinus infection around 2 weeks ago. Patient took antibiotic until she developed diarrhea after a couple of days taking medication. Patient stopped antibiotic. Patient has symptoms of abdominal pain, bloating, increased flatus, weight loss, muscles and joint aches. Patient has had bout of diarrhea since. Patient states diarrhea changed 2 days ago. Now she has blood tinged with mucous stools. Patient has been treating symptoms with supplement liquid  and Zofran with mild relief. Has a history of c diff in the past after taking levaquin and cefdinir.      Medications: Outpatient Medications Prior to Visit  Medication Sig  . amLODipine (NORVASC) 5 MG  tablet Take 5 mg by mouth daily.  Marland Kitchen azelastine (ASTELIN) 0.1 % nasal spray azelastine 137 mcg (0.1 %) nasal spray aerosol  . CALCIUM PO Take by mouth.  . clobetasol ointment (TEMOVATE) 0.05 % APP TOPICALLY TO GLUTEAL AREA ONCE D PRF DERMATITIS  . Cyanocobalamin (VITAMIN B-12 PO) Take by mouth daily.  Marland Kitchen escitalopram (LEXAPRO) 20 MG tablet TAKE 1 TABLET BY MOUTH EVERY DAY  . fluticasone (FLONASE) 50 MCG/ACT nasal spray SHAKE LIQUID AND USE 2 SPRAYS IN EACH NOSTRIL EVERY DAY  . gabapentin (NEURONTIN) 100 MG capsule Take 100 mg by mouth 2 (two) times daily.  . hydrochlorothiazide (HYDRODIURIL) 25 MG tablet TAKE 1 TABLET(25 MG) BY MOUTH DAILY  . loratadine (CLARITIN) 10 MG tablet Take 10 mg by mouth daily.  Marland Kitchen losartan (COZAAR) 25 MG tablet Take 25 mg by mouth daily.  . meloxicam (MOBIC) 15 MG tablet Take 15 mg by mouth daily.  . methocarbamol (ROBAXIN) 500 MG tablet Take 500 mg by mouth every 8 (eight) hours as needed.  . ondansetron (ZOFRAN) 4 MG tablet ondansetron HCl 4 mg tablet  TAKE 1 TABLET BY MOUTH EVERY DAY  . oxyCODONE-acetaminophen (PERCOCET) 10-325 MG tablet Take 1 tablet by mouth every 4 (four) hours as needed for pain.  . rosuvastatin (CRESTOR) 5 MG tablet TAKE 1 TABLET(5 MG) BY MOUTH DAILY  . traZODone (DESYREL) 100 MG tablet TAKE 1 TABLET(100 MG) BY MOUTH AT BEDTIME AS NEEDED FOR SLEEP  . Turmeric-Ginger 150-25 MG CHEW Chew by mouth.  Marland Kitchen VITAMIN D PO Take by mouth daily.  . calcitonin, salmon, (MIACALCIN/FORTICAL) 200 UNIT/ACT nasal spray 2 sprays  daily. (Patient not taking: Reported on 01/18/2020)  . HYDROcodone-acetaminophen (NORCO) 7.5-325 MG tablet Take 1 tablet by mouth 3 (three) times daily. (Patient not taking: Reported on 01/18/2020)   No facility-administered medications prior to visit.    Review of Systems  Constitutional: Positive for weight loss. Negative for chills and fever.  Respiratory: Negative for cough.   Gastrointestinal: Positive for abdominal pain, bloating,  diarrhea and flatus. Negative for vomiting.  Musculoskeletal: Positive for arthralgias and myalgias.  Neurological: Negative for headaches.      Objective    BP 133/83 (BP Location: Right Arm, Patient Position: Sitting, Cuff Size: Normal)   Pulse 65   Temp 98.2 F (36.8 C) (Oral)   Resp 18   Wt 114 lb (51.7 kg)   SpO2 96%   BMI 21.54 kg/m    Physical Exam Vitals and nursing note reviewed.  Constitutional:      Appearance: Normal appearance. She is normal weight.  Cardiovascular:     Rate and Rhythm: Normal rate and regular rhythm.     Pulses: Normal pulses.     Heart sounds: Normal heart sounds.  Pulmonary:     Effort: Pulmonary effort is normal.     Breath sounds: Normal breath sounds.  Abdominal:     General: Bowel sounds are normal.     Palpations: Abdomen is soft.     Tenderness: There is abdominal tenderness.  Musculoskeletal:        General: Normal range of motion.     Cervical back: Normal range of motion and neck supple.  Skin:    General: Skin is warm and dry.  Neurological:     Mental Status: She is alert.       No results found for any visits on 01/18/20.  Assessment & Plan    1. Diarrhea of presumed infectious origin  Start tx for c. Diff as below considering symptoms, recent broad spec abx use and history of c. Diff. Bring back sample if able. Labs as below. Return precautions counseled.  - CBC w/Diff/Platelet - Comprehensive Metabolic Panel (CMET) - vancomycin (VANCOCIN HCL) 125 MG capsule; Take 1 capsule (125 mg total) by mouth 4 (four) times daily.  Dispense: 40 capsule; Refill: 0 - GI Profile, Stool, PCR    Return in about 1 week (around 01/25/2020) for diarrhea .      ITrinna Post, PA-C, have reviewed all documentation for this visit. The documentation on 01/19/20 for the exam, diagnosis, procedures, and orders are all accurate and complete.    Paulene Floor  [facility] [phone]  (phone) [phone] (fax)  [place]

## 2020-01-18 ENCOUNTER — Telehealth: Payer: Self-pay

## 2020-01-18 ENCOUNTER — Other Ambulatory Visit: Payer: Self-pay

## 2020-01-18 ENCOUNTER — Ambulatory Visit (INDEPENDENT_AMBULATORY_CARE_PROVIDER_SITE_OTHER): Payer: PPO | Admitting: Physician Assistant

## 2020-01-18 ENCOUNTER — Encounter: Payer: Self-pay | Admitting: Physician Assistant

## 2020-01-18 VITALS — BP 133/83 | HR 65 | Temp 98.2°F | Resp 18 | Wt 114.0 lb

## 2020-01-18 DIAGNOSIS — R197 Diarrhea, unspecified: Secondary | ICD-10-CM | POA: Diagnosis not present

## 2020-01-18 MED ORDER — VANCOMYCIN HCL 125 MG PO CAPS: 125.0000 mg | ORAL_CAPSULE | Freq: Four times a day (QID) | ORAL | 0 refills | Status: DC

## 2020-01-18 NOTE — Telephone Encounter (Signed)
Patient was seen in office today.  

## 2020-01-18 NOTE — Telephone Encounter (Signed)
Copied from [place] [id number]. Topic: General - Call Back - No Documentation >> Jan 17, 2020  3:55 PM April Best wrote: Reason for CRM: Patient states she was returning phone call from [facility]. I did advise patient of needing appointment and stool sample, appointment scheduled. Patient states she won't be able to give a stool sample. Please contact patient for resolution.

## 2020-01-19 DIAGNOSIS — R768 Other specified abnormal immunological findings in serum: Secondary | ICD-10-CM | POA: Insufficient documentation

## 2020-01-19 DIAGNOSIS — M533 Sacrococcygeal disorders, not elsewhere classified: Secondary | ICD-10-CM | POA: Diagnosis not present

## 2020-01-19 DIAGNOSIS — L4050 Arthropathic psoriasis, unspecified: Secondary | ICD-10-CM | POA: Diagnosis not present

## 2020-01-19 DIAGNOSIS — Z111 Encounter for screening for respiratory tuberculosis: Secondary | ICD-10-CM | POA: Diagnosis not present

## 2020-01-19 DIAGNOSIS — G8929 Other chronic pain: Secondary | ICD-10-CM | POA: Diagnosis not present

## 2020-01-19 DIAGNOSIS — M539 Dorsopathy, unspecified: Secondary | ICD-10-CM | POA: Diagnosis not present

## 2020-01-19 DIAGNOSIS — L409 Psoriasis, unspecified: Secondary | ICD-10-CM | POA: Diagnosis not present

## 2020-01-19 DIAGNOSIS — Z79899 Other long term (current) drug therapy: Secondary | ICD-10-CM | POA: Diagnosis not present

## 2020-01-20 DIAGNOSIS — R197 Diarrhea, unspecified: Secondary | ICD-10-CM | POA: Diagnosis not present

## 2020-01-21 ENCOUNTER — Encounter: Payer: Self-pay | Admitting: Physician Assistant

## 2020-01-21 DIAGNOSIS — R739 Hyperglycemia, unspecified: Secondary | ICD-10-CM

## 2020-01-24 ENCOUNTER — Telehealth: Payer: Self-pay | Admitting: Physician Assistant

## 2020-01-24 LAB — COMPREHENSIVE METABOLIC PANEL
ALT: 19 IU/L (ref 0–32)
AST: 18 IU/L (ref 0–40)
Albumin/Globulin Ratio: 2.1 (ref 1.2–2.2)
Albumin: 4.5 g/dL (ref 3.8–4.8)
Alkaline Phosphatase: 57 IU/L (ref 48–121)
BUN/Creatinine Ratio: 19 (ref 12–28)
BUN: 16 mg/dL (ref 8–27)
Bilirubin Total: 0.3 mg/dL (ref 0.0–1.2)
CO2: 25 mmol/L (ref 20–29)
Calcium: 9.8 mg/dL (ref 8.7–10.3)
Chloride: 101 mmol/L (ref 96–106)
Creatinine, Ser: 0.84 mg/dL (ref 0.57–1.00)
GFR calc Af Amer: 83 mL/min/{1.73_m2} (ref >59)
GFR calc non Af Amer: 72 mL/min/{1.73_m2} (ref >59)
Globulin, Total: 2.1 g/dL (ref 1.5–4.5)
Glucose: 113 mg/dL — ABNORMAL HIGH (ref 65–99)
Potassium: 3.6 mmol/L (ref 3.5–5.2)
Sodium: 142 mmol/L (ref 134–144)
Total Protein: 6.6 g/dL (ref 6.0–8.5)

## 2020-01-24 LAB — CBC WITH DIFFERENTIAL/PLATELET
Basophils Absolute: 0.1 10*3/uL (ref 0.0–0.2)
Basos: 1 %
EOS (ABSOLUTE): 0.2 10*3/uL (ref 0.0–0.4)
Eos: 1 %
Hematocrit: 44.9 % (ref 34.0–46.6)
Hemoglobin: 15.2 g/dL (ref 11.1–15.9)
Immature Grans (Abs): 0.2 10*3/uL — ABNORMAL HIGH (ref 0.0–0.1)
Immature Granulocytes: 2 %
Lymphocytes Absolute: 2.3 10*3/uL (ref 0.7–3.1)
Lymphs: 17 %
MCH: 33 pg (ref 26.6–33.0)
MCHC: 33.9 g/dL (ref 31.5–35.7)
MCV: 98 fL — ABNORMAL HIGH (ref 79–97)
Monocytes Absolute: 1.2 10*3/uL — ABNORMAL HIGH (ref 0.1–0.9)
Monocytes: 9 %
Neutrophils Absolute: 9.3 10*3/uL — ABNORMAL HIGH (ref 1.4–7.0)
Neutrophils: 70 %
Platelets: 371 10*3/uL (ref 150–450)
RBC: 4.6 x10E6/uL (ref 3.77–5.28)
RDW: 12.3 % (ref 11.7–15.4)
WBC: 13.3 10*3/uL — ABNORMAL HIGH (ref 3.4–10.8)

## 2020-01-24 LAB — GI PROFILE, STOOL, PCR

## 2020-01-24 NOTE — Telephone Encounter (Signed)
Pt called back and said she has an appt tomorrow with Fabio Bering and she will talk to her then.

## 2020-01-24 NOTE — Telephone Encounter (Signed)
Called patient and left a message for patient to return call. If patient return call okay for PEC to advise patient to schedule the appointment if patient agree with appointment.

## 2020-01-24 NOTE — Progress Notes (Signed)
Established patient visit   Patient: April Best   DOB: 1951/05/15   69 y.o. Female  MRN: [id number] Visit Date: 01/25/2020  Today's healthcare provider: Trinna Post, PA-C   Chief Complaint  Patient presents with  . Diarrhea  . Urinary Frequency   Subjective    HPI  Diarrhea Patient was seen in the office 1 week ago and was treated for C Diff empirically as stool sample was not collected. Since then, her GI profile has come back positive for C. Diff. She had an elevated white count at 13 and left shift. She was started on vancomycin 125 mg QID x 4 days which she has taken. She reports that she is still taking vancomycin and is tolerating well. She does still report some moderate abdominal cramping but says this has improved. She denies vomiting, fevers, chills. She does have nausea. She reports the consistency of her stool is more formed and less frequent.   CBC Latest Ref Rng & Units 01/20/2020 12/31/2019 07/02/2019  WBC 3.4 - 10.8 x10E3/uL 13.3(H) 9.3 9.7  Hemoglobin 11.1 - 15.9 g/dL 15.2 15.4(H) 14.4  Hematocrit 34.0 - 46.6 % 44.9 45.9 44.2  Platelets 150 - 450 x10E3/uL 371 299 303    In the interim, she has been diagnosed with RA and psoriatic arthritis. She is being started on methotrexate after she completes her course of vancomycin.   Cystitis She is also c/o urinary frequency for the last few days, beginning last Wednesday. Reports dysuria. Denies pelvic pain, fevers, chills, vomiting. She has taken AZO tablets for it.       Medications: Outpatient Medications Prior to Visit  Medication Sig  . amLODipine (NORVASC) 5 MG tablet Take 5 mg by mouth daily.  Marland Kitchen azelastine (ASTELIN) 0.1 % nasal spray azelastine 137 mcg (0.1 %) nasal spray aerosol  . calcitonin, salmon, (MIACALCIN/FORTICAL) 200 UNIT/ACT nasal spray 2 sprays daily. (Patient not taking: Reported on 01/18/2020)  . CALCIUM PO Take by mouth.  . clobetasol ointment (TEMOVATE) 0.05 % APP TOPICALLY TO GLUTEAL  AREA ONCE D PRF DERMATITIS  . Cyanocobalamin (VITAMIN B-12 PO) Take by mouth daily.  Marland Kitchen escitalopram (LEXAPRO) 20 MG tablet TAKE 1 TABLET BY MOUTH EVERY DAY  . fluticasone (FLONASE) 50 MCG/ACT nasal spray SHAKE LIQUID AND USE 2 SPRAYS IN EACH NOSTRIL EVERY DAY  . gabapentin (NEURONTIN) 100 MG capsule Take 100 mg by mouth 2 (two) times daily.  . hydrochlorothiazide (HYDRODIURIL) 25 MG tablet TAKE 1 TABLET(25 MG) BY MOUTH DAILY  . HYDROcodone-acetaminophen (NORCO) 7.5-325 MG tablet Take 1 tablet by mouth 3 (three) times daily. (Patient not taking: Reported on 01/18/2020)  . loratadine (CLARITIN) 10 MG tablet Take 10 mg by mouth daily.  Marland Kitchen losartan (COZAAR) 25 MG tablet Take 25 mg by mouth daily.  . meloxicam (MOBIC) 15 MG tablet Take 15 mg by mouth daily.  . methocarbamol (ROBAXIN) 500 MG tablet Take 500 mg by mouth every 8 (eight) hours as needed.  . ondansetron (ZOFRAN) 4 MG tablet ondansetron HCl 4 mg tablet  TAKE 1 TABLET BY MOUTH EVERY DAY  . oxyCODONE-acetaminophen (PERCOCET) 10-325 MG tablet Take 1 tablet by mouth every 4 (four) hours as needed for pain.  . rosuvastatin (CRESTOR) 5 MG tablet TAKE 1 TABLET(5 MG) BY MOUTH DAILY  . traZODone (DESYREL) 100 MG tablet TAKE 1 TABLET(100 MG) BY MOUTH AT BEDTIME AS NEEDED FOR SLEEP  . Turmeric-Ginger 150-25 MG CHEW Chew by mouth.  . vancomycin (VANCOCIN HCL) 125 MG  capsule Take 1 capsule (125 mg total) by mouth 4 (four) times daily.  Marland Kitchen VITAMIN D PO Take by mouth daily.   No facility-administered medications prior to visit.    Review of Systems  Constitutional: Negative.   Gastrointestinal: Positive for diarrhea.      Objective    BP 125/74   Pulse 93   Temp 98.7 F (37.1 C)   Wt 114 lb (51.7 kg)   BMI 21.54 kg/m    Physical Exam Constitutional:      Appearance: Normal appearance.  Cardiovascular:     Rate and Rhythm: Normal rate and regular rhythm.     Pulses: Normal pulses.     Heart sounds: Normal heart sounds.  Pulmonary:      Effort: Pulmonary effort is normal.     Breath sounds: Normal breath sounds.  Abdominal:     General: Bowel sounds are normal.     Palpations: Abdomen is soft.     Tenderness: There is no abdominal tenderness.  Skin:    General: Skin is warm and dry.  Neurological:     Mental Status: She is alert and oriented to person, place, and time. Mental status is at baseline.  Psychiatric:        Mood and Affect: Mood normal.        Behavior: Behavior normal.      No results found for any visits on 01/25/20.  Assessment & Plan    1. C. difficile diarrhea  Refer to [facility] for follow up. If symptoms recur she will need re-evaluation and likely pulse tapered course of vancomycin.  2. Cystitis  Dipstick cannot be read due to AZO. Will send off for UA and culture. Start as below. Counseled this is an antibiotic and can contribute to C. Diff though less of risk than cefdinir which she took most recently.  - Urine Culture - Urinalysis, Routine w reflex microscopic - cephALEXin (KEFLEX) 500 MG capsule; Take 1 capsule (500 mg total) by mouth 2 (two) times daily for 5 days.  Dispense: 10 capsule; Refill: 0  3. Nausea  - ondansetron (ZOFRAN) 4 MG tablet; Take 1 tablet (4 mg total) by mouth every 8 (eight) hours as needed for nausea or vomiting.  Dispense: 30 tablet; Refill: 0  4. Rheumatoid arthritis with positive rheumatoid factor, involving unspecified site [facility])  She is starting treatment with methotrexate after c. Diff resolves.     No follow-ups on file.      ITrinna Post, PA-C, have reviewed all documentation for this visit. The documentation on 01/25/20 for the exam, diagnosis, procedures, and orders are all accurate and complete.    Paulene Floor  [facility] [phone] (phone) [phone] (fax)  [place]

## 2020-01-24 NOTE — Telephone Encounter (Signed)
We can double book her on my 4:00 PM virtually and she can drop off a sample and do the visit. Can we call to schedule her for her UTI? Thanks.

## 2020-01-25 ENCOUNTER — Ambulatory Visit (INDEPENDENT_AMBULATORY_CARE_PROVIDER_SITE_OTHER): Payer: PPO | Admitting: Physician Assistant

## 2020-01-25 ENCOUNTER — Other Ambulatory Visit: Payer: Self-pay

## 2020-01-25 ENCOUNTER — Encounter: Payer: Self-pay | Admitting: Physician Assistant

## 2020-01-25 VITALS — BP 125/74 | HR 93 | Temp 98.7°F | Wt 114.0 lb

## 2020-01-25 DIAGNOSIS — R110 Nausea: Secondary | ICD-10-CM

## 2020-01-25 DIAGNOSIS — N3090 Cystitis, unspecified without hematuria: Secondary | ICD-10-CM | POA: Diagnosis not present

## 2020-01-25 DIAGNOSIS — L4050 Arthropathic psoriasis, unspecified: Secondary | ICD-10-CM | POA: Insufficient documentation

## 2020-01-25 DIAGNOSIS — R350 Frequency of micturition: Secondary | ICD-10-CM | POA: Diagnosis not present

## 2020-01-25 DIAGNOSIS — M059 Rheumatoid arthritis with rheumatoid factor, unspecified: Secondary | ICD-10-CM

## 2020-01-25 DIAGNOSIS — A0472 Enterocolitis due to Clostridium difficile, not specified as recurrent: Secondary | ICD-10-CM | POA: Diagnosis not present

## 2020-01-25 MED ORDER — CEPHALEXIN 500 MG PO CAPS: 500.0000 mg | ORAL_CAPSULE | Freq: Two times a day (BID) | ORAL | 0 refills | Status: AC

## 2020-01-25 MED ORDER — ONDANSETRON HCL 4 MG PO TABS: 4.0000 mg | ORAL_TABLET | Freq: Three times a day (TID) | ORAL | 0 refills | Status: DC | PRN

## 2020-01-26 DIAGNOSIS — N281 Cyst of kidney, acquired: Secondary | ICD-10-CM | POA: Diagnosis not present

## 2020-01-26 DIAGNOSIS — R110 Nausea: Secondary | ICD-10-CM | POA: Diagnosis not present

## 2020-01-26 DIAGNOSIS — M5412 Radiculopathy, cervical region: Secondary | ICD-10-CM | POA: Diagnosis not present

## 2020-01-26 DIAGNOSIS — Z79899 Other long term (current) drug therapy: Secondary | ICD-10-CM | POA: Diagnosis not present

## 2020-01-26 DIAGNOSIS — M810 Age-related osteoporosis without current pathological fracture: Secondary | ICD-10-CM | POA: Diagnosis not present

## 2020-01-26 DIAGNOSIS — G894 Chronic pain syndrome: Secondary | ICD-10-CM | POA: Diagnosis not present

## 2020-01-26 LAB — URINALYSIS, ROUTINE W REFLEX MICROSCOPIC
Bilirubin, UA: NEGATIVE
Glucose, UA: NEGATIVE
Ketones, UA: NEGATIVE
Nitrite, UA: POSITIVE — AB
Specific Gravity, UA: >=1.03 — AB (ref 1.005–1.030)
Urobilinogen, Ur: 1 mg/dL (ref 0.2–1.0)
pH, UA: 5 (ref 5.0–7.5)

## 2020-01-26 LAB — MICROSCOPIC EXAMINATION
Bacteria, UA: NONE SEEN
Casts: NONE SEEN /lpf
RBC, Urine: >30 /hpf — AB (ref 0–2)

## 2020-01-27 LAB — URINE CULTURE

## 2020-01-31 DIAGNOSIS — A0471 Enterocolitis due to Clostridium difficile, recurrent: Secondary | ICD-10-CM | POA: Diagnosis not present

## 2020-01-31 DIAGNOSIS — L4050 Arthropathic psoriasis, unspecified: Secondary | ICD-10-CM | POA: Diagnosis not present

## 2020-01-31 DIAGNOSIS — K5730 Diverticulosis of large intestine without perforation or abscess without bleeding: Secondary | ICD-10-CM | POA: Diagnosis not present

## 2020-01-31 DIAGNOSIS — K635 Polyp of colon: Secondary | ICD-10-CM | POA: Diagnosis not present

## 2020-02-03 DIAGNOSIS — I10 Essential (primary) hypertension: Secondary | ICD-10-CM | POA: Diagnosis not present

## 2020-02-03 DIAGNOSIS — N281 Cyst of kidney, acquired: Secondary | ICD-10-CM | POA: Diagnosis not present

## 2020-02-03 DIAGNOSIS — R809 Proteinuria, unspecified: Secondary | ICD-10-CM | POA: Diagnosis not present

## 2020-02-07 DIAGNOSIS — M4312 Spondylolisthesis, cervical region: Secondary | ICD-10-CM | POA: Diagnosis not present

## 2020-02-07 DIAGNOSIS — M542 Cervicalgia: Secondary | ICD-10-CM | POA: Diagnosis not present

## 2020-02-07 DIAGNOSIS — M47812 Spondylosis without myelopathy or radiculopathy, cervical region: Secondary | ICD-10-CM | POA: Diagnosis not present

## 2020-02-07 DIAGNOSIS — M810 Age-related osteoporosis without current pathological fracture: Secondary | ICD-10-CM | POA: Diagnosis not present

## 2020-02-07 DIAGNOSIS — M50321 Other cervical disc degeneration at C4-C5 level: Secondary | ICD-10-CM | POA: Diagnosis not present

## 2020-02-11 DIAGNOSIS — R739 Hyperglycemia, unspecified: Secondary | ICD-10-CM | POA: Diagnosis not present

## 2020-02-12 LAB — HEMOGLOBIN A1C
Est. average glucose Bld gHb Est-mCnc: 120 mg/dL
Hgb A1c MFr Bld: 5.8 % — ABNORMAL HIGH (ref 4.8–5.6)

## 2020-02-25 ENCOUNTER — Encounter: Payer: Self-pay | Admitting: Physician Assistant

## 2020-02-25 ENCOUNTER — Other Ambulatory Visit: Payer: Self-pay

## 2020-02-25 ENCOUNTER — Ambulatory Visit: Payer: PPO | Admitting: Physician Assistant

## 2020-02-25 VITALS — BP 116/73 | HR 78 | Ht 61.0 in | Wt 115.0 lb

## 2020-02-25 DIAGNOSIS — R350 Frequency of micturition: Secondary | ICD-10-CM | POA: Diagnosis not present

## 2020-02-25 DIAGNOSIS — R3915 Urgency of urination: Secondary | ICD-10-CM

## 2020-02-25 DIAGNOSIS — R300 Dysuria: Secondary | ICD-10-CM | POA: Diagnosis not present

## 2020-02-25 LAB — URINALYSIS, COMPLETE
Bilirubin, UA: NEGATIVE
Glucose, UA: NEGATIVE
Ketones, UA: NEGATIVE
Leukocytes,UA: NEGATIVE
Nitrite, UA: NEGATIVE
Protein,UA: NEGATIVE
RBC, UA: NEGATIVE
Specific Gravity, UA: 1.025 (ref 1.005–1.030)
Urobilinogen, Ur: 0.2 mg/dL (ref 0.2–1.0)
pH, UA: 7 (ref 5.0–7.5)

## 2020-02-25 LAB — MICROSCOPIC EXAMINATION: Bacteria, UA: NONE SEEN

## 2020-02-25 MED ORDER — MIRABEGRON ER 25 MG PO TB24: 25.0000 mg | ORAL_TABLET | Freq: Every day | ORAL | 0 refills | Status: DC

## 2020-02-25 NOTE — Progress Notes (Signed)
In and Out Catheterization  Patient is present today for a I & O catheterization due to dysuria and frequency. Patient was cleaned and prepped in a sterile fashion with betadine . A 14FR cath was inserted no complications were noted , 62ml of urine return was noted, urine was yellow in color. A clean urine sample was collected for UA and culture. Bladder was drained and catheter was removed without difficulty.    Performed by: Debroah Loop, PA-C

## 2020-02-25 NOTE — Progress Notes (Signed)
02/25/2020 4:23 PM   April Best December 17, 1950 [id number]  CC: Chief Complaint  Patient presents with  . Acute Visit    frequent urination    HPI: April Best is a 69 y.o. female with an incidental left renal mass awaiting MRI, acute cystitis with hematuria, and urinary frequency who presents today for evaluation of possible UTI.  She recently received outpatient treatment for C. difficile colitis and was treated for UTI by her PCP with reports of urinary frequency and dysuria.  UA dated 01/25/2020 notable for nitrites, 3+ leukocyte esterase, and 1+ protein in the setting of Azo use; microscopy with WBC clumps and >30 RBCs/hpf.  Urine culture ultimately grew pansensitive E. coli; she was treated with Keflex 500 mg twice daily x5 days.  Today she reports an 8-day history of urethral spasm and burning with urination.  She took Azo for relief of her symptoms, which helped.  She reports urinary frequency with UTIs but not at baseline.  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  She wears pads daily for these.  She does report a history of dry mouth and dry eyes on methotrexate.  She also has occasional opioid induced constipation and takes stool softeners for bowel management when this occurs.  CTAP without contrast dated 12/11/2019 without urolithiasis.  In-office catheterized UA today pan negative; urine microscopy with 6-10 WBCs/HPF.  PMH: Past Medical History:  Diagnosis Date  . Allergy   . Annular oral lichen planus   . Anxiety   . Arthritis   . Depression   . Guaiac positive stools    history  . H. pylori infection   . 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 History of palpitations   . Hyperlipidemia   . Hypertension   . Migraine   . Osteoporosis   . Panic disorder   . Psoriasis     Surgical History: Past Surgical History:  Procedure  Laterality Date  . ABDOMINAL HYSTERECTOMY  1983   Ovaries have been removed.  . ABDOMINOPLASTY    . APPENDECTOMY    . BREAST BIOPSY Right    neg- core  . COLONOSCOPY  03/2006  . COLONOSCOPY WITH PROPOFOL N/A 11/19/2017   Procedure: COLONOSCOPY WITH PROPOFOL;  Surgeon: Toledo, Benay Pike, MD;  Location: [facility] ENDOSCOPY;  Service: Gastroenterology;  Laterality: N/A;  . RHINOPLASTY  2002  . UPPER GI ENDOSCOPY  2008    Home Medications:  Allergies as of 02/25/2020      Reactions   Augmentin  [amoxicillin-pot Clavulanate] Nausea And Vomiting   as stated (XR).   Cefdinir Diarrhea, Other (See Comments)   Other reaction(s): GI intolerance, Other (see comments) Gives her Cdiff Gives her Cdiff   Lisinopril Cough      Medication List       Accurate as of February 25, 2020  4:23 PM. If you have any questions, ask your nurse or doctor.        STOP taking these medications   calcitonin (salmon) 200 UNIT/ACT nasal spray Commonly known as: MIACALCIN/FORTICAL Stopped by: Debroah Loop, PA-C   HYDROcodone-acetaminophen 7.5-325 MG tablet Commonly known as: NORCO Stopped by: Debroah Loop, PA-C     TAKE these medications   amLODipine 5 MG tablet Commonly known as: NORVASC Take 5 mg by mouth daily.   azelastine 0.1 % nasal spray Commonly known as: ASTELIN azelastine 137 mcg (0.1 %) nasal spray aerosol  CALCIUM PO Take by mouth.   clobetasol ointment 0.05 % Commonly known as: TEMOVATE APP TOPICALLY TO GLUTEAL AREA ONCE D PRF DERMATITIS   escitalopram 20 MG tablet Commonly known as: LEXAPRO TAKE 1 TABLET BY MOUTH EVERY DAY   fluticasone 50 MCG/ACT nasal spray Commonly known as: FLONASE SHAKE LIQUID AND USE 2 SPRAYS IN EACH NOSTRIL EVERY DAY   gabapentin 100 MG capsule Commonly known as: NEURONTIN Take 100 mg by mouth 2 (two) times daily.   hydrochlorothiazide 25 MG tablet Commonly known as: HYDRODIURIL TAKE 1 TABLET(25 MG) BY MOUTH DAILY   loratadine  10 MG tablet Commonly known as: CLARITIN Take 10 mg by mouth daily.   losartan 25 MG tablet Commonly known as: COZAAR Take 25 mg by mouth daily.   meloxicam 15 MG tablet Commonly known as: MOBIC Take 15 mg by mouth daily.   methocarbamol 500 MG tablet Commonly known as: ROBAXIN Take 500 mg by mouth every 8 (eight) hours as needed.   mirabegron ER 25 MG Tb24 tablet Commonly known as: MYRBETRIQ Take 1 tablet (25 mg total) by mouth daily. Started by: Debroah Loop, PA-C   ondansetron 4 MG tablet Commonly known as: ZOFRAN Take 1 tablet (4 mg total) by mouth every 8 (eight) hours as needed for nausea or vomiting.   oxyCODONE-acetaminophen 10-325 MG tablet Commonly known as: PERCOCET Take 1 tablet by mouth every 4 (four) hours as needed for pain.   rosuvastatin 5 MG tablet Commonly known as: CRESTOR TAKE 1 TABLET(5 MG) BY MOUTH DAILY   traZODone 100 MG tablet Commonly known as: DESYREL TAKE 1 TABLET(100 MG) BY MOUTH AT BEDTIME AS NEEDED FOR SLEEP   Turmeric-Ginger 150-25 MG Chew Chew by mouth.   vancomycin 125 MG capsule Commonly known as: Vancocin HCl Take 1 capsule (125 mg total) by mouth 4 (four) times daily.   VITAMIN B-12 PO Take by mouth daily.   VITAMIN D PO Take by mouth daily.       Allergies:  Allergies  Allergen Reactions  . Augmentin  [Amoxicillin-Pot Clavulanate] Nausea And Vomiting    as stated (XR).  . Cefdinir Diarrhea and Other (See Comments)    Other reaction(s): GI intolerance, Other (see comments) Gives her Cdiff Gives her Cdiff   . Lisinopril Cough    Family History: Family History  Problem Relation Age of Onset  . Hypertension Mother   . Hyperlipidemia Mother   . Hypothyroidism Mother   . Fibromyalgia Mother   . Lung cancer Father   . Coronary artery disease Father   . Hyperlipidemia Father   . Hypertension Father   . Heart attack Father   . Hyperlipidemia Sister   . Hypertension Sister   . Fibromyalgia Sister     . ADD / ADHD Brother   . Hemochromatosis Sister   . Breast cancer Neg Hx     Social History:   reports that she has been smoking cigarettes. She has a 24.50 pack-year smoking history. She has never used smokeless tobacco. She reports previous alcohol use. She reports that she does not use drugs.  Physical Exam: BP 116/73   Pulse 78   Ht 5\' 1"  (1.549 m)   Wt 115 lb (52.2 kg)   BMI 21.73 kg/m   Constitutional:  Alert and oriented, no acute distress, nontoxic appearing HEENT: [place], AT Cardiovascular: No clubbing, cyanosis, or edema Respiratory: Normal respiratory effort, no increased work of breathing Skin: No rashes, bruises or suspicious lesions Neurologic: Grossly intact, no focal deficits,  moving all 4 extremities Psychiatric: Normal mood and affect  Laboratory Data: Results for orders placed or performed in visit on 02/25/20  CULTURE, URINE COMPREHENSIVE   Specimen: Urine   UR  Result Value Ref Range   Urine Culture, Comprehensive Preliminary report (A)    Organism ID, Bacteria Gram negative rods (A)   Microscopic Examination   Urine  Result Value Ref Range   WBC, UA 6-10 (A) 0 - 5 /hpf   RBC 0-2 0 - 2 /hpf   Epithelial Cells (non renal) 0-10 0 - 10 /hpf   Casts Present (A) None seen /lpf   Cast Type Hyaline casts N/A   Bacteria, UA None seen None seen/Few  Urinalysis, Complete  Result Value Ref Range   Specific Gravity, UA 1.025 1.005 - 1.030   pH, UA 7.0 5.0 - 7.5   Color, UA Yellow Yellow   Appearance Ur Clear Clear   Leukocytes,UA Negative Negative   Protein,UA Negative Negative/Trace   Glucose, UA Negative Negative   Ketones, UA Negative Negative   RBC, UA Negative Negative   Bilirubin, UA Negative Negative   Urobilinogen, Ur 0.2 0.2 - 1.0 mg/dL   Nitrite, UA Negative Negative   Microscopic Examination See below:    Assessment & Plan:   1. Dysuria UA benign today, will send for culture for further evaluation before prescribing antibiotics.  I suspect  her symptoms may be secondary to underlying OAB as below. - Urinalysis, Complete - CULTURE, URINE COMPREHENSIVE  2. Urinary urgency Patient has an element of mixed incontinence with urge and functional features.  I suspect these may be contributing to her presumed infective symptoms.  I recommend starting a trial of Myrbetriq 25 mg daily with plans for symptom recheck and PVR in 1 month.  She expressed understanding. - mirabegron ER (MYRBETRIQ) 25 MG TB24 tablet; Take 1 tablet (25 mg total) by mouth daily.  Dispense: 28 tablet; Refill: 0   Return in about 4 weeks (around 03/24/2020) for Symptom recheck with PVR.  Debroah Loop, PA-C  [facility] [address], [place], [place] [address] [phone]

## 2020-03-01 DIAGNOSIS — R768 Other specified abnormal immunological findings in serum: Secondary | ICD-10-CM | POA: Diagnosis not present

## 2020-03-01 DIAGNOSIS — N281 Cyst of kidney, acquired: Secondary | ICD-10-CM | POA: Diagnosis not present

## 2020-03-01 DIAGNOSIS — R110 Nausea: Secondary | ICD-10-CM | POA: Diagnosis not present

## 2020-03-01 DIAGNOSIS — G894 Chronic pain syndrome: Secondary | ICD-10-CM | POA: Diagnosis not present

## 2020-03-01 DIAGNOSIS — M810 Age-related osteoporosis without current pathological fracture: Secondary | ICD-10-CM | POA: Diagnosis not present

## 2020-03-01 DIAGNOSIS — M5412 Radiculopathy, cervical region: Secondary | ICD-10-CM | POA: Diagnosis not present

## 2020-03-01 DIAGNOSIS — Z79891 Long term (current) use of opiate analgesic: Secondary | ICD-10-CM | POA: Diagnosis not present

## 2020-03-01 DIAGNOSIS — M539 Dorsopathy, unspecified: Secondary | ICD-10-CM | POA: Diagnosis not present

## 2020-03-01 DIAGNOSIS — L4050 Arthropathic psoriasis, unspecified: Secondary | ICD-10-CM | POA: Diagnosis not present

## 2020-03-01 DIAGNOSIS — R760 Raised antibody titer: Secondary | ICD-10-CM | POA: Diagnosis not present

## 2020-03-01 DIAGNOSIS — Z79899 Other long term (current) drug therapy: Secondary | ICD-10-CM | POA: Diagnosis not present

## 2020-03-01 DIAGNOSIS — L409 Psoriasis, unspecified: Secondary | ICD-10-CM | POA: Diagnosis not present

## 2020-03-01 LAB — CULTURE, URINE COMPREHENSIVE

## 2020-03-02 ENCOUNTER — Telehealth: Payer: Self-pay | Admitting: Physician Assistant

## 2020-03-02 MED ORDER — NITROFURANTOIN MONOHYD MACRO 100 MG PO CAPS: 100.0000 mg | ORAL_CAPSULE | Freq: Two times a day (BID) | ORAL | 0 refills | Status: AC

## 2020-03-02 NOTE — Telephone Encounter (Signed)
Please contact the patient and inform her that her recent urine culture was positive.  I would like to get her started on some antibiotics.  I have sent a prescription for Macrobid 100 mg twice daily x5 days to [facility].  Please counsel her to continue Myrbetriq and keep her scheduled follow-up appointment with me next month.

## 2020-03-02 NOTE — Telephone Encounter (Signed)
Notified patient as advised. Patient verbalized understanding and confirmed her next appt.

## 2020-03-06 DIAGNOSIS — G629 Polyneuropathy, unspecified: Secondary | ICD-10-CM | POA: Diagnosis not present

## 2020-03-21 ENCOUNTER — Other Ambulatory Visit: Payer: Self-pay | Admitting: Neurology

## 2020-03-21 DIAGNOSIS — E569 Vitamin deficiency, unspecified: Secondary | ICD-10-CM | POA: Diagnosis not present

## 2020-03-21 DIAGNOSIS — M792 Neuralgia and neuritis, unspecified: Secondary | ICD-10-CM | POA: Diagnosis not present

## 2020-03-21 DIAGNOSIS — Z114 Encounter for screening for human immunodeficiency virus [HIV]: Secondary | ICD-10-CM | POA: Diagnosis not present

## 2020-03-21 DIAGNOSIS — R200 Anesthesia of skin: Secondary | ICD-10-CM | POA: Diagnosis not present

## 2020-03-21 DIAGNOSIS — R7309 Other abnormal glucose: Secondary | ICD-10-CM | POA: Diagnosis not present

## 2020-03-28 ENCOUNTER — Telehealth: Payer: Self-pay | Admitting: Physician Assistant

## 2020-03-28 ENCOUNTER — Ambulatory Visit: Payer: PPO | Admitting: Physician Assistant

## 2020-03-28 NOTE — Telephone Encounter (Signed)
Patient had a follow up appt with PVR scheduled for today with Sam.   She called the office this morning and cancelled the appointment.  Patient states that her UTI symptoms have resolved.    Patient wanted me to note that she did not start the Myrbetriq samples that were given to her at the last visit.    Today's appointment cancelled - patient has a follow up with Dr. Bernardo Heater scheduled on 04/14/20.

## 2020-04-05 ENCOUNTER — Other Ambulatory Visit: Payer: Self-pay

## 2020-04-05 ENCOUNTER — Ambulatory Visit
Admission: RE | Admit: 2020-04-05 | Discharge: 2020-04-05 | Disposition: A | Discharge status: Home / Self Care | Payer: PPO | Source: Ambulatory Visit | Attending: Neurology | Admitting: Neurology

## 2020-04-05 DIAGNOSIS — J323 Chronic sphenoidal sinusitis: Secondary | ICD-10-CM | POA: Diagnosis not present

## 2020-04-05 DIAGNOSIS — J3489 Other specified disorders of nose and nasal sinuses: Secondary | ICD-10-CM | POA: Diagnosis not present

## 2020-04-05 DIAGNOSIS — E569 Vitamin deficiency, unspecified: Secondary | ICD-10-CM

## 2020-04-05 DIAGNOSIS — R200 Anesthesia of skin: Secondary | ICD-10-CM | POA: Insufficient documentation

## 2020-04-05 DIAGNOSIS — J320 Chronic maxillary sinusitis: Secondary | ICD-10-CM | POA: Diagnosis not present

## 2020-04-05 DIAGNOSIS — R52 Pain, unspecified: Secondary | ICD-10-CM | POA: Diagnosis not present

## 2020-04-06 ENCOUNTER — Other Ambulatory Visit: Payer: Self-pay | Admitting: Physician Assistant

## 2020-04-06 DIAGNOSIS — R110 Nausea: Secondary | ICD-10-CM

## 2020-04-06 NOTE — Telephone Encounter (Signed)
Requested medication (s) are due for refill today - unknown  Requested medication (s) are on the active medication list -yes  Future visit scheduled -no  Last refill: 01/25/20  Notes to clinic: Request non delegated rx  Requested Prescriptions  Pending Prescriptions Disp Refills   ondansetron (ZOFRAN) 4 MG tablet [Pharmacy Med Name: ONDANSETRON HCL 4 MG TAB] 30 tablet 0    Sig: TAKE ONE TABLET BY MOUTH EVERY EIGHT HOURS AS NEEDED FOR NAUSEA / [place]      Not Delegated - Gastroenterology: Antiemetics Failed - 04/06/2020  8:22 AM      Failed - This refill cannot be delegated      Passed - Valid encounter within last 6 months    Recent Outpatient Visits           2 months ago C. difficile diarrhea   [facility] [place], Fabio Bering M, [place]   2 months ago Diarrhea of presumed infectious origin   [place], [place], PA-C   5 months ago Acute cystitis without hematuria   [facility], Clearnce Sorrel, PA-C   6 months ago Annual physical exam   [facility] Carles Collet M, PA-C   7 months ago Age-related osteoporosis without current pathological fracture   [facility] [place], Dionne Bucy, MD       Future Appointments             In 1 week [place], MD [place]                Requested Prescriptions  Pending Prescriptions Disp Refills   ondansetron (ZOFRAN) 4 MG tablet [Pharmacy Med Name: ONDANSETRON HCL 4 MG TAB] 30 tablet 0    Sig: TAKE ONE TABLET BY MOUTH EVERY EIGHT HOURS AS NEEDED FOR NAUSEA / [place]      Not Delegated - Gastroenterology: Antiemetics Failed - 04/06/2020  8:22 AM      Failed - This refill cannot be delegated      Passed - Valid encounter within last 6 months    Recent Outpatient Visits           2 months ago C. difficile diarrhea   [facility] [place], [address], [place]   2 months ago Diarrhea of presumed  infectious origin   [place], [place], PA-C   5 months ago Acute cystitis without hematuria   [facility] [place], Clearnce Sorrel, PA-C   6 months ago Annual physical exam   [facility] Carles Collet M, PA-C   7 months ago Age-related osteoporosis without current pathological fracture   [facility] [place], Dionne Bucy, MD       Future Appointments             In 1 week [place], Ronda Fairly, MD [place]

## 2020-04-06 NOTE — Telephone Encounter (Signed)
Ok to refill 

## 2020-04-11 ENCOUNTER — Other Ambulatory Visit: Payer: Self-pay

## 2020-04-11 ENCOUNTER — Ambulatory Visit
Admission: RE | Admit: 2020-04-11 | Discharge: 2020-04-11 | Disposition: A | Discharge status: Home / Self Care | Payer: PPO | Source: Ambulatory Visit | Attending: Urology | Admitting: Urology

## 2020-04-11 DIAGNOSIS — N281 Cyst of kidney, acquired: Secondary | ICD-10-CM | POA: Diagnosis not present

## 2020-04-11 DIAGNOSIS — N2889 Other specified disorders of kidney and ureter: Secondary | ICD-10-CM | POA: Diagnosis not present

## 2020-04-11 DIAGNOSIS — K828 Other specified diseases of gallbladder: Secondary | ICD-10-CM | POA: Diagnosis not present

## 2020-04-11 DIAGNOSIS — I700 Atherosclerosis of aorta: Secondary | ICD-10-CM | POA: Diagnosis not present

## 2020-04-11 MED ORDER — GADOBUTROL 1 MMOL/ML IV SOLN
5.0000 mL | Freq: Once | INTRAVENOUS | Status: AC | PRN
  Administered 2020-04-11: 5 mL via INTRAVENOUS

## 2020-04-14 ENCOUNTER — Other Ambulatory Visit: Payer: Self-pay

## 2020-04-14 ENCOUNTER — Encounter: Payer: Self-pay | Admitting: Urology

## 2020-04-14 ENCOUNTER — Ambulatory Visit: Payer: PPO | Admitting: Urology

## 2020-04-14 VITALS — BP 124/76 | HR 80 | Ht 61.0 in | Wt 116.0 lb

## 2020-04-14 DIAGNOSIS — N281 Cyst of kidney, acquired: Secondary | ICD-10-CM | POA: Diagnosis not present

## 2020-04-14 NOTE — Progress Notes (Signed)
04/14/2020 4:34 PM   April Best November 16, 1950 [id number]  Referring provider: Trinna Post, PA-C [address] [place],  [place] [address]  Chief Complaint  Patient presents with  . Follow-up    HPI: 69 y.o. female presents for renal mass follow-up.   Initially seen 12/13/2019 for a 2 cm indeterminate left renal mass seen on noncontrast CT abdomen pelvis  6-month follow-up renal mass protocol MRI was recommended  Acute visit with [name] in our office 02/25/2020 for acute cystitis; urine culture positive and symptoms resolved on antibiotics  Abdominal MRI performed 04/11/2020 remarkable for a 2.4 cm left lower pole renal cyst   PMH: Past Medical History:  Diagnosis Date  . Allergy   . Annular oral lichen planus   . Anxiety   . Arthritis   . Depression   . Guaiac positive stools    history  . H. pylori infection   . 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 History of palpitations   . Hyperlipidemia   . Hypertension   . Migraine   . Osteoporosis   . Panic disorder   . Psoriasis     Surgical History: Past Surgical History:  Procedure Laterality Date  . ABDOMINAL HYSTERECTOMY  1983   Ovaries have been removed.  . ABDOMINOPLASTY    . APPENDECTOMY    . BREAST BIOPSY Right    neg- core  . COLONOSCOPY  03/2006  . COLONOSCOPY WITH PROPOFOL N/A 11/19/2017   Procedure: COLONOSCOPY WITH PROPOFOL;  Surgeon: Toledo, Benay Pike, MD;  Location: [facility] ENDOSCOPY;  Service: Gastroenterology;  Laterality: N/A;  . RHINOPLASTY  2002  . UPPER GI ENDOSCOPY  2008    Home Medications:  Allergies as of 04/14/2020      Reactions   Augmentin  [amoxicillin-pot Clavulanate] Nausea And Vomiting   as stated (XR).   Cefdinir Diarrhea, Other (See Comments)   Other reaction(s): GI intolerance, Other (see comments) Gives her Cdiff Gives her Cdiff   Lisinopril Cough      Medication List       Accurate as of April 14, 2020  4:34 PM. If you have any questions, ask  your nurse or doctor.        STOP taking these medications   mirabegron ER 25 MG Tb24 tablet Commonly known as: MYRBETRIQ Stopped by: [name], MD   vancomycin 125 MG capsule Commonly known as: Vancocin HCl Stopped by: [name], MD     TAKE these medications   amLODipine 5 MG tablet Commonly known as: NORVASC Take 5 mg by mouth daily.   azelastine 0.1 % nasal spray Commonly known as: ASTELIN azelastine 137 mcg (0.1 %) nasal spray aerosol   CALCIUM PO Take by mouth.   clobetasol ointment 0.05 % Commonly known as: TEMOVATE APP TOPICALLY TO GLUTEAL AREA ONCE D PRF DERMATITIS   escitalopram 20 MG tablet Commonly known as: LEXAPRO TAKE 1 TABLET BY MOUTH EVERY DAY   fluticasone 50 MCG/ACT nasal spray Commonly known as: FLONASE SHAKE LIQUID AND USE 2 SPRAYS IN EACH NOSTRIL EVERY DAY   gabapentin 100 MG capsule Commonly known as: NEURONTIN Take 100 mg by mouth 2 (two) times daily.   hydrochlorothiazide 25 MG tablet Commonly known as: HYDRODIURIL TAKE 1 TABLET(25 MG) BY MOUTH DAILY   loratadine 10 MG tablet Commonly known as: CLARITIN Take 10 mg by mouth daily.   losartan 25 MG tablet Commonly known as: COZAAR Take 25 mg by mouth daily.   meloxicam 15 MG  tablet Commonly known as: MOBIC Take 15 mg by mouth daily.   methocarbamol 500 MG tablet Commonly known as: ROBAXIN Take 500 mg by mouth every 8 (eight) hours as needed.   ondansetron 4 MG tablet Commonly known as: ZOFRAN TAKE ONE TABLET BY MOUTH EVERY EIGHT HOURS AS NEEDED FOR NAUSEA / VOMITING   oxyCODONE-acetaminophen 10-325 MG tablet Commonly known as: PERCOCET Take 1 tablet by mouth every 4 (four) hours as needed for pain.   rosuvastatin 5 MG tablet Commonly known as: CRESTOR TAKE 1 TABLET(5 MG) BY MOUTH DAILY   traZODone 100 MG tablet Commonly known as: DESYREL TAKE 1 TABLET(100 MG) BY MOUTH AT BEDTIME AS NEEDED FOR SLEEP   Turmeric-Ginger 150-25 MG Chew Chew by mouth.    VITAMIN B-12 PO Take by mouth daily.   VITAMIN D PO Take by mouth daily.       Allergies:  Allergies  Allergen Reactions  . Augmentin  [Amoxicillin-Pot Clavulanate] Nausea And Vomiting    as stated (XR).  . Cefdinir Diarrhea and Other (See Comments)    Other reaction(s): GI intolerance, Other (see comments) Gives her Cdiff Gives her Cdiff   . Lisinopril Cough    Family History: Family History  Problem Relation Age of Onset  . Hypertension Mother   . Hyperlipidemia Mother   . Hypothyroidism Mother   . Fibromyalgia Mother   . Lung cancer Father   . Coronary artery disease Father   . Hyperlipidemia Father   . Hypertension Father   . Heart attack Father   . Hyperlipidemia Sister   . Hypertension Sister   . Fibromyalgia Sister   . ADD / ADHD Brother   . Hemochromatosis Sister   . Breast cancer Neg Hx     Social History:  reports that she has been smoking cigarettes. She has a 24.50 pack-year smoking history. She has never used smokeless tobacco. She reports previous alcohol use. She reports that she does not use drugs.   Physical Exam: BP 124/76   Pulse 80   Ht 5\' 1"  (1.549 m)   Wt 116 lb (52.6 kg)   BMI 21.92 kg/m   Constitutional:  Alert and oriented, No acute distress. HEENT: [place] AT, moist mucus membranes.  Trachea midline, no masses. Cardiovascular: No clubbing, cyanosis, or edema. Respiratory: Normal respiratory effort, no increased work of breathing.    Pertinent Imaging: MRI images reviewed  Assessment & Plan:    1.  Left renal cyst  2.4 cm simple renal cyst by MRI  We discussed the benign nature of this finding and that no follow-up imaging is required  Return prn UTI symptoms   [name], MD  [name] [address], [place], [place] [address] [phone]

## 2020-04-19 DIAGNOSIS — M5412 Radiculopathy, cervical region: Secondary | ICD-10-CM | POA: Diagnosis not present

## 2020-04-19 DIAGNOSIS — M810 Age-related osteoporosis without current pathological fracture: Secondary | ICD-10-CM | POA: Diagnosis not present

## 2020-04-19 DIAGNOSIS — G894 Chronic pain syndrome: Secondary | ICD-10-CM | POA: Diagnosis not present

## 2020-04-19 DIAGNOSIS — N281 Cyst of kidney, acquired: Secondary | ICD-10-CM | POA: Diagnosis not present

## 2020-04-19 DIAGNOSIS — R110 Nausea: Secondary | ICD-10-CM | POA: Diagnosis not present

## 2020-04-19 DIAGNOSIS — Z79899 Other long term (current) drug therapy: Secondary | ICD-10-CM | POA: Diagnosis not present

## 2020-04-19 DIAGNOSIS — M545 Low back pain, unspecified: Secondary | ICD-10-CM | POA: Diagnosis not present

## 2020-05-14 ENCOUNTER — Other Ambulatory Visit: Payer: Self-pay | Admitting: Physician Assistant

## 2020-05-14 DIAGNOSIS — R110 Nausea: Secondary | ICD-10-CM

## 2020-05-14 DIAGNOSIS — G4700 Insomnia, unspecified: Secondary | ICD-10-CM

## 2020-05-15 DIAGNOSIS — M792 Neuralgia and neuritis, unspecified: Secondary | ICD-10-CM | POA: Diagnosis not present

## 2020-05-15 NOTE — Telephone Encounter (Signed)
L.O.V. was on 01/25/2020 and no upcoming visit. Is it okay to refill medications?

## 2020-05-16 ENCOUNTER — Other Ambulatory Visit: Payer: Self-pay | Admitting: Physician Assistant

## 2020-05-16 DIAGNOSIS — M792 Neuralgia and neuritis, unspecified: Secondary | ICD-10-CM | POA: Diagnosis not present

## 2020-05-16 DIAGNOSIS — R110 Nausea: Secondary | ICD-10-CM

## 2020-05-16 NOTE — Telephone Encounter (Signed)
Requested medication (s) are due for refill today: yes  Requested medication (s) are on the active medication list: yes   Last refill: 04/07/2020  Future visit scheduled: no  Notes to clinic:  this refill cannot be delegated    Requested Prescriptions  Pending Prescriptions Disp Refills   ondansetron (ZOFRAN) 4 MG tablet [Pharmacy Med Name: ONDANSETRON HCL 4 MG TAB] 30 tablet 0    Sig: TAKE ONE TABLET BY MOUTH EVERY EIGHT HOURS AS NEEDED FOR NAUSEA / VOMITING      Not Delegated - Gastroenterology: Antiemetics Failed - 05/16/2020  2:23 PM      Failed - This refill cannot be delegated      Passed - Valid encounter within last 6 months    Recent Outpatient Visits           3 months ago C. difficile diarrhea   [facility] [place], Fabio Bering M, PA-C   3 months ago Diarrhea of presumed infectious origin   [place], [place], PA-C   7 months ago Acute cystitis without hematuria   [facility] [place], Clearnce Sorrel, [place]   8 months ago Annual physical exam   [place], [place], PA-C   8 months ago Age-related osteoporosis without current pathological fracture   [facility], Dionne Bucy, MD

## 2020-05-17 DIAGNOSIS — M5416 Radiculopathy, lumbar region: Secondary | ICD-10-CM | POA: Diagnosis not present

## 2020-05-17 DIAGNOSIS — M47812 Spondylosis without myelopathy or radiculopathy, cervical region: Secondary | ICD-10-CM | POA: Diagnosis not present

## 2020-05-17 MED ORDER — TRAZODONE HCL 100 MG PO TABS: 100.0000 mg | ORAL_TABLET | Freq: Every day | ORAL | 1 refills | Status: DC

## 2020-05-17 NOTE — Telephone Encounter (Signed)
I have not refilled this because the last office visit I have associated with this medicine is 01/2020. I have refilled it again on 03/2020. If she is having acute vomiting then she should be assessed. She is more than welcome to see a physician in this office, the next time she is in the clinic she may request to see Dr. Brita Romp.

## 2020-05-23 DIAGNOSIS — L4050 Arthropathic psoriasis, unspecified: Secondary | ICD-10-CM | POA: Diagnosis not present

## 2020-05-23 DIAGNOSIS — M539 Dorsopathy, unspecified: Secondary | ICD-10-CM | POA: Diagnosis not present

## 2020-05-23 DIAGNOSIS — R110 Nausea: Secondary | ICD-10-CM | POA: Diagnosis not present

## 2020-05-23 DIAGNOSIS — R768 Other specified abnormal immunological findings in serum: Secondary | ICD-10-CM | POA: Diagnosis not present

## 2020-05-23 DIAGNOSIS — L409 Psoriasis, unspecified: Secondary | ICD-10-CM | POA: Diagnosis not present

## 2020-05-24 DIAGNOSIS — E569 Vitamin deficiency, unspecified: Secondary | ICD-10-CM | POA: Diagnosis not present

## 2020-05-24 DIAGNOSIS — G5603 Carpal tunnel syndrome, bilateral upper limbs: Secondary | ICD-10-CM | POA: Diagnosis not present

## 2020-05-24 DIAGNOSIS — M539 Dorsopathy, unspecified: Secondary | ICD-10-CM | POA: Diagnosis not present

## 2020-05-24 DIAGNOSIS — M792 Neuralgia and neuritis, unspecified: Secondary | ICD-10-CM | POA: Diagnosis not present

## 2020-05-30 NOTE — Telephone Encounter (Signed)
Called patient and April Best.

## 2020-05-31 DIAGNOSIS — M5481 Occipital neuralgia: Secondary | ICD-10-CM | POA: Diagnosis not present

## 2020-06-01 ENCOUNTER — Other Ambulatory Visit: Payer: Self-pay | Admitting: Physician Assistant

## 2020-06-01 DIAGNOSIS — F410 Panic disorder [episodic paroxysmal anxiety] without agoraphobia: Secondary | ICD-10-CM

## 2020-06-01 MED ORDER — ESCITALOPRAM OXALATE 20 MG PO TABS: 20.0000 mg | ORAL_TABLET | Freq: Every day | ORAL | 1 refills | Status: DC

## 2020-06-20 ENCOUNTER — Encounter: Payer: Self-pay | Admitting: Family Medicine

## 2020-06-20 DIAGNOSIS — I10 Essential (primary) hypertension: Secondary | ICD-10-CM

## 2020-06-21 DIAGNOSIS — R110 Nausea: Secondary | ICD-10-CM | POA: Diagnosis not present

## 2020-06-21 DIAGNOSIS — N281 Cyst of kidney, acquired: Secondary | ICD-10-CM | POA: Diagnosis not present

## 2020-06-21 DIAGNOSIS — M810 Age-related osteoporosis without current pathological fracture: Secondary | ICD-10-CM | POA: Diagnosis not present

## 2020-06-21 DIAGNOSIS — M545 Low back pain, unspecified: Secondary | ICD-10-CM | POA: Diagnosis not present

## 2020-06-21 DIAGNOSIS — Z79899 Other long term (current) drug therapy: Secondary | ICD-10-CM | POA: Diagnosis not present

## 2020-06-21 DIAGNOSIS — M5412 Radiculopathy, cervical region: Secondary | ICD-10-CM | POA: Diagnosis not present

## 2020-06-21 DIAGNOSIS — G894 Chronic pain syndrome: Secondary | ICD-10-CM | POA: Diagnosis not present

## 2020-06-21 MED ORDER — HYDROCHLOROTHIAZIDE 25 MG PO TABS: 25.0000 mg | ORAL_TABLET | Freq: Every day | ORAL | 1 refills | Status: DC

## 2020-07-10 ENCOUNTER — Inpatient Hospital Stay: Payer: PPO | Attending: Internal Medicine

## 2020-07-10 DIAGNOSIS — F17210 Nicotine dependence, cigarettes, uncomplicated: Secondary | ICD-10-CM | POA: Diagnosis not present

## 2020-07-10 DIAGNOSIS — Z801 Family history of malignant neoplasm of trachea, bronchus and lung: Secondary | ICD-10-CM | POA: Diagnosis not present

## 2020-07-10 DIAGNOSIS — Z90710 Acquired absence of both cervix and uterus: Secondary | ICD-10-CM | POA: Insufficient documentation

## 2020-07-10 DIAGNOSIS — Z79899 Other long term (current) drug therapy: Secondary | ICD-10-CM | POA: Diagnosis not present

## 2020-07-10 DIAGNOSIS — D751 Secondary polycythemia: Secondary | ICD-10-CM | POA: Insufficient documentation

## 2020-07-10 DIAGNOSIS — L4050 Arthropathic psoriasis, unspecified: Secondary | ICD-10-CM | POA: Diagnosis not present

## 2020-07-10 DIAGNOSIS — F1021 Alcohol dependence, in remission: Secondary | ICD-10-CM | POA: Diagnosis not present

## 2020-07-10 DIAGNOSIS — G629 Polyneuropathy, unspecified: Secondary | ICD-10-CM | POA: Insufficient documentation

## 2020-07-10 LAB — IRON AND TIBC
Iron: 125 ug/dL (ref 28–170)
Saturation Ratios: 37 % — ABNORMAL HIGH (ref 10.4–31.8)
TIBC: 337 ug/dL (ref 250–450)
UIBC: 212 ug/dL

## 2020-07-10 LAB — CBC WITH DIFFERENTIAL/PLATELET
Abs Immature Granulocytes: 0.24 10*3/uL — ABNORMAL HIGH (ref 0.00–0.07)
Basophils Absolute: 0.1 10*3/uL (ref 0.0–0.1)
Basophils Relative: 1 %
Eosinophils Absolute: 0.1 10*3/uL (ref 0.0–0.5)
Eosinophils Relative: 1 %
HCT: 47.1 % — ABNORMAL HIGH (ref 36.0–46.0)
Hemoglobin: 15.6 g/dL — ABNORMAL HIGH (ref 12.0–15.0)
Immature Granulocytes: 2 %
Lymphocytes Relative: 21 %
Lymphs Abs: 2.6 10*3/uL (ref 0.7–4.0)
MCH: 32.8 pg (ref 26.0–34.0)
MCHC: 33.1 g/dL (ref 30.0–36.0)
MCV: 98.9 fL (ref 80.0–100.0)
Monocytes Absolute: 0.7 10*3/uL (ref 0.1–1.0)
Monocytes Relative: 6 %
Neutro Abs: 8.5 10*3/uL — ABNORMAL HIGH (ref 1.7–7.7)
Neutrophils Relative %: 69 %
Platelets: 318 10*3/uL (ref 150–400)
RBC: 4.76 MIL/uL (ref 3.87–5.11)
RDW: 12.8 % (ref 11.5–15.5)
WBC: 12.1 10*3/uL — ABNORMAL HIGH (ref 4.0–10.5)
nRBC: 0 % (ref 0.0–0.2)

## 2020-07-10 LAB — FERRITIN: Ferritin: 86 ng/mL (ref 11–307)

## 2020-07-17 ENCOUNTER — Inpatient Hospital Stay: Payer: PPO

## 2020-07-17 ENCOUNTER — Inpatient Hospital Stay: Payer: PPO | Admitting: Internal Medicine

## 2020-07-17 NOTE — Progress Notes (Signed)
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 Manuella Ghazi Cymbalta  INTERVAL HISTORY:  A very pleasant 70 year old female patient with above history of  [name] following screening  without any end organ dysfunction is here for follow-up /phlebotomy.   In the interim patient was diagnosed with psoriatic arthritis.  She is currently on Enbrel.  Notes to improvement of her pain.  Also started on Cymbalta by Dr. Manuella Ghazi for her neuropathy.  Improved.  Unfortunately she continues to smoke.  Review of Systems  Constitutional: Positive for malaise/fatigue. Negative for chills, diaphoresis, fever and weight loss.  HENT: Negative for nosebleeds and sore throat.   Eyes: Negative for double vision.  Respiratory: Negative for cough, hemoptysis, sputum production, shortness of breath and wheezing.   Cardiovascular: Negative for chest pain, palpitations, orthopnea and leg swelling.  Gastrointestinal: Negative for abdominal pain, blood in stool, constipation, diarrhea, heartburn, melena, nausea and vomiting.  Genitourinary: Negative for dysuria, frequency and urgency.  Musculoskeletal: Positive for back pain, joint pain, myalgias and neck pain.  Skin: Negative.  Negative for itching and rash.  Neurological: Negative  for dizziness, tingling, focal weakness, weakness and headaches.  Endo/Heme/Allergies: Does not bruise/bleed easily.  Psychiatric/Behavioral: Negative for depression. The patient is not nervous/anxious and does not have insomnia.       PAST MEDICAL HISTORY :  Past Medical History:  Diagnosis Date  . Allergy   . Annular oral lichen planus   . Anxiety   . Arthritis   . Depression   . Guaiac positive stools    history  . H. pylori infection   . 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 History of palpitations   . Hyperlipidemia   . Hypertension   . Migraine   . Osteoporosis   . Panic disorder   . Psoriasis     PAST SURGICAL HISTORY :   Past Surgical History:  Procedure Laterality Date  . ABDOMINAL HYSTERECTOMY  1983   Ovaries have been removed.  . ABDOMINOPLASTY    . APPENDECTOMY    . BREAST BIOPSY Right    neg- core  . COLONOSCOPY  03/2006  . COLONOSCOPY WITH PROPOFOL N/A 11/19/2017   Procedure: COLONOSCOPY WITH PROPOFOL;  Surgeon: Toledo, Benay Pike, MD;  Location: [facility] ENDOSCOPY;  Service: Gastroenterology;  Laterality: N/A;  . RHINOPLASTY  2002  . UPPER GI ENDOSCOPY  2008    FAMILY HISTORY :   Family History  Problem Relation Age of Onset  . Hypertension Mother   . Hyperlipidemia Mother   . Hypothyroidism Mother   . Fibromyalgia Mother   . Lung cancer Father   . Coronary artery disease Father   . Hyperlipidemia Father   . Hypertension Father   . Heart attack Father   . Hyperlipidemia Sister   . Hypertension Sister   . Fibromyalgia Sister   . ADD / ADHD Brother   . Hemochromatosis Sister   . Breast cancer Neg Hx     SOCIAL HISTORY:  Social History   Tobacco Use  . Smoking status: Current Every Day Smoker    Packs/day: 0.50    Years: 49.00    Pack years: 24.50    Types: Cigarettes  . Smokeless tobacco: Never Used  . Tobacco comment: pt declines smoking cessation ( smoked since age 42)  Vaping Use  . Vaping Use: Never used  Substance Use Topics  . Alcohol  use: Not Currently    Alcohol/week: 0.0 standard drinks  . Drug use: No    ALLERGIES:  is allergic to augmentin  [amoxicillin-pot clavulanate], cefdinir, and lisinopril.  MEDICATIONS:  Current Outpatient Medications  Medication Sig Dispense Refill  . azelastine (ASTELIN) 0.1 % nasal spray azelastine 137 mcg (0.1 %) nasal spray aerosol    . CALCIUM PO Take by mouth.    . clobetasol ointment (TEMOVATE) 0.05 % APP TOPICALLY TO GLUTEAL AREA ONCE D PRF DERMATITIS 30 g 1  . Cyanocobalamin (VITAMIN B-12 PO) Take by mouth daily.    . DULoxetine (CYMBALTA) 20 MG capsule Take 20 mg by mouth daily.    Marland Kitchen escitalopram (LEXAPRO) 20 MG tablet Take 1 tablet (20 mg total) by mouth daily. 90 tablet 1  . etanercept (ENBREL) 50 MG/ML injection Inject into the skin.    . fluticasone (FLONASE) 50 MCG/ACT nasal spray SHAKE LIQUID AND USE 2 SPRAYS IN EACH NOSTRIL EVERY DAY 16 g 0  . gabapentin (NEURONTIN) 100 MG capsule Take 100 mg by mouth 2 (two) times daily.    . hydrochlorothiazide (HYDRODIURIL) 25 MG tablet Take 1 tablet (25 mg total) by mouth daily. 90 tablet 1  . loratadine (CLARITIN) 10 MG tablet Take 10 mg by mouth daily.    Marland Kitchen losartan (COZAAR) 25 MG tablet Take 25 mg by mouth daily.    . meloxicam (MOBIC) 15 MG tablet Take 15 mg by mouth daily.    . methocarbamol (ROBAXIN) 500 MG tablet Take 500 mg by mouth every 8 (eight) hours as needed.    . ondansetron (ZOFRAN) 4 MG tablet TAKE ONE TABLET BY MOUTH EVERY EIGHT HOURS AS NEEDED FOR NAUSEA / VOMITING 30 tablet 0  . oxyCODONE-acetaminophen (PERCOCET) 10-325 MG tablet Take 1 tablet by mouth every 4 (four) hours as needed for pain.    . rosuvastatin (CRESTOR) 5 MG tablet TAKE 1 TABLET(5 MG) BY MOUTH DAILY 90 tablet 3  . traZODone (DESYREL) 100 MG tablet Take 1 tablet (100 mg total) by mouth at bedtime. 90 tablet 1  . Turmeric-Ginger 150-25 MG CHEW Chew by mouth.    Marland Kitchen VITAMIN D PO Take by mouth daily.     No current facility-administered medications for  this visit.    PHYSICAL EXAMINATION: ECOG PERFORMANCE STATUS: 0 - Asymptomatic  BP 129/75 (BP Location: Left Arm, Patient Position: Sitting, Cuff Size: Normal)   Pulse 99   Temp 98.4 F (36.9 C) (Tympanic)   Resp 16   Ht 5\' 1"  (1.549 m)   Wt 118 lb (53.5 kg)   SpO2 97%   BMI 22.30 kg/m   Filed Weights   07/17/20 1354  Weight: 118 lb (53.5 kg)    Physical Exam Constitutional:      Comments: Ambulating independently.  Alone.  HENT:     Head: Normocephalic and atraumatic.     Mouth/Throat:     Pharynx: No oropharyngeal exudate.  Eyes:     Pupils: Pupils are equal, round, and reactive to light.  Cardiovascular:     Rate and Rhythm: Normal rate and regular  rhythm.  Pulmonary:     Effort: Pulmonary effort is normal. No respiratory distress.     Breath sounds: Normal breath sounds. No wheezing.  Abdominal:     General: Bowel sounds are normal. There is no distension.     Palpations: Abdomen is soft. There is no mass.     Tenderness: There is no abdominal tenderness. There is no guarding or rebound.  Musculoskeletal:        General: No tenderness. Normal range of motion.     Cervical back: Normal range of motion and neck supple.  Skin:    General: Skin is warm.  Neurological:     Mental Status: She is alert and oriented to person, place, and time.  Psychiatric:        Mood and Affect: Affect normal.      LABORATORY DATA:  I have reviewed the data as listed    Component Value Date/Time   NA 142 01/20/2020 1327   K 3.6 01/20/2020 1327   CL 101 01/20/2020 1327   CO2 25 01/20/2020 1327   GLUCOSE 113 (H) 01/20/2020 1327   GLUCOSE 92 01/01/2019 1324   BUN 16 01/20/2020 1327   CREATININE 0.84 01/20/2020 1327   CREATININE 0.57 05/05/2017 1653   CALCIUM 9.8 01/20/2020 1327   PROT 6.6 01/20/2020 1327   ALBUMIN 4.5 01/20/2020 1327   AST 18 01/20/2020 1327   ALT 19 01/20/2020 1327   ALKPHOS 57 01/20/2020 1327   BILITOT 0.3 01/20/2020 1327   GFRNONAA 72 01/20/2020  1327   GFRNONAA 97 05/05/2017 1653   GFRAA 83 01/20/2020 1327   GFRAA 113 05/05/2017 1653    No results found for: SPEP, UPEP  Lab Results  Component Value Date   WBC 12.1 (H) 07/10/2020   NEUTROABS 8.5 (H) 07/10/2020   HGB 15.6 (H) 07/10/2020   HCT 47.1 (H) 07/10/2020   MCV 98.9 07/10/2020   PLT 318 07/10/2020      Chemistry      Component Value Date/Time   NA 142 01/20/2020 1327   K 3.6 01/20/2020 1327   CL 101 01/20/2020 1327   CO2 25 01/20/2020 1327   BUN 16 01/20/2020 1327   CREATININE 0.84 01/20/2020 1327   CREATININE 0.57 05/05/2017 1653   GLU 102 09/05/2014 0000      Component Value Date/Time   CALCIUM 9.8 01/20/2020 1327   ALKPHOS 57 01/20/2020 1327   AST 18 01/20/2020 1327   ALT 19 01/20/2020 1327   BILITOT 0.3 01/20/2020 1327       ASSESSMENT & PLAN:   [name] ([place]) # HEREDITARY  Hemochromatosis/ compound heterozygous C282Y & H63D-July  iron saturations 37%; ferritin 86.  Patient continues to be asymptomatic. STABLE  #Do not recommend phlebotomy unless ferritin greater than 150 or iron saturation greater than 50%.  No phlebotomy today.  # Psoriasis arthritis-on Enbrel [Dr.Patel;[facility]; Rheum- STABLE  #Mild erythrocytosis hematocrit 46-likely secondary to smoking.  Counseled the patient to quit smoking.  # active smoker-on LCSP-10/23/2019 CT scan reviewed negative for any malignancy.  #History of alcoholism -again congratulated patient for being sober; [last quit 2019]  DISPOSITION: # No phlebotomy today # 6 months with labs [facility], iron studies, ferritin] 1 week prior, MD/ possible phlebotomy- Dr.B      Cammie Sickle, MD 07/17/2020 2:40 PM

## 2020-07-17 NOTE — Assessment & Plan Note (Addendum)
#   HEREDITARY  Hemochromatosis/ compound heterozygous C282Y & H63D-July  iron saturations 37%; ferritin 86.  Patient continues to be asymptomatic. STABLE  #Do not recommend phlebotomy unless ferritin greater than 150 or iron saturation greater than 50%.  No phlebotomy today.  # Psoriasis arthritis-on Enbrel [Dr.Patel;[facility]; Rheum- STABLE  #Mild erythrocytosis hematocrit 46-likely secondary to smoking.  Counseled the patient to quit smoking.  # active smoker-on LCSP-10/23/2019 CT scan reviewed negative for any malignancy.  #History of alcoholism -again congratulated patient for being sober; [last quit 2019]  DISPOSITION: # No phlebotomy today # 6 months with labs [facility], iron studies, ferritin] 1 week prior, MD/ possible phlebotomy- Dr.B

## 2020-07-27 ENCOUNTER — Other Ambulatory Visit: Payer: Self-pay | Admitting: *Deleted

## 2020-07-27 DIAGNOSIS — Z122 Encounter for screening for malignant neoplasm of respiratory organs: Secondary | ICD-10-CM

## 2020-07-27 DIAGNOSIS — F17200 Nicotine dependence, unspecified, uncomplicated: Secondary | ICD-10-CM

## 2020-07-27 DIAGNOSIS — Z87891 Personal history of nicotine dependence: Secondary | ICD-10-CM

## 2020-07-27 NOTE — Progress Notes (Signed)
Contacted and scheduled for annual lung screening scan. Patient is a current smoker with a 51 pack year history.

## 2020-08-08 ENCOUNTER — Other Ambulatory Visit: Payer: Self-pay

## 2020-08-08 ENCOUNTER — Ambulatory Visit
Admission: RE | Admit: 2020-08-08 | Discharge: 2020-08-08 | Disposition: A | Discharge status: Home / Self Care | Payer: PPO | Source: Ambulatory Visit | Attending: Nurse Practitioner | Admitting: Nurse Practitioner

## 2020-08-08 DIAGNOSIS — F17200 Nicotine dependence, unspecified, uncomplicated: Secondary | ICD-10-CM | POA: Diagnosis not present

## 2020-08-08 DIAGNOSIS — Z87891 Personal history of nicotine dependence: Secondary | ICD-10-CM | POA: Diagnosis not present

## 2020-08-08 DIAGNOSIS — Z122 Encounter for screening for malignant neoplasm of respiratory organs: Secondary | ICD-10-CM | POA: Insufficient documentation

## 2020-08-08 DIAGNOSIS — F17210 Nicotine dependence, cigarettes, uncomplicated: Secondary | ICD-10-CM | POA: Diagnosis not present

## 2020-08-09 DIAGNOSIS — R809 Proteinuria, unspecified: Secondary | ICD-10-CM | POA: Diagnosis not present

## 2020-08-09 DIAGNOSIS — N281 Cyst of kidney, acquired: Secondary | ICD-10-CM | POA: Diagnosis not present

## 2020-08-09 DIAGNOSIS — I10 Essential (primary) hypertension: Secondary | ICD-10-CM | POA: Diagnosis not present

## 2020-08-14 ENCOUNTER — Encounter: Payer: Self-pay | Admitting: *Deleted

## 2020-08-16 DIAGNOSIS — N281 Cyst of kidney, acquired: Secondary | ICD-10-CM | POA: Diagnosis not present

## 2020-08-16 DIAGNOSIS — M545 Low back pain, unspecified: Secondary | ICD-10-CM | POA: Diagnosis not present

## 2020-08-16 DIAGNOSIS — M8438XA Stress fracture, other site, initial encounter for fracture: Secondary | ICD-10-CM | POA: Diagnosis not present

## 2020-08-16 DIAGNOSIS — G894 Chronic pain syndrome: Secondary | ICD-10-CM | POA: Diagnosis not present

## 2020-08-16 DIAGNOSIS — R110 Nausea: Secondary | ICD-10-CM | POA: Diagnosis not present

## 2020-08-16 DIAGNOSIS — M5412 Radiculopathy, cervical region: Secondary | ICD-10-CM | POA: Diagnosis not present

## 2020-08-16 DIAGNOSIS — M5416 Radiculopathy, lumbar region: Secondary | ICD-10-CM | POA: Diagnosis not present

## 2020-08-16 DIAGNOSIS — Z79899 Other long term (current) drug therapy: Secondary | ICD-10-CM | POA: Diagnosis not present

## 2020-08-16 DIAGNOSIS — M810 Age-related osteoporosis without current pathological fracture: Secondary | ICD-10-CM | POA: Diagnosis not present

## 2020-08-21 DIAGNOSIS — M539 Dorsopathy, unspecified: Secondary | ICD-10-CM | POA: Diagnosis not present

## 2020-08-21 DIAGNOSIS — L4050 Arthropathic psoriasis, unspecified: Secondary | ICD-10-CM | POA: Diagnosis not present

## 2020-08-21 DIAGNOSIS — L409 Psoriasis, unspecified: Secondary | ICD-10-CM | POA: Diagnosis not present

## 2020-08-23 DIAGNOSIS — M5416 Radiculopathy, lumbar region: Secondary | ICD-10-CM | POA: Diagnosis not present

## 2020-09-01 ENCOUNTER — Other Ambulatory Visit: Payer: Self-pay | Admitting: Family Medicine

## 2020-09-01 DIAGNOSIS — F410 Panic disorder [episodic paroxysmal anxiety] without agoraphobia: Secondary | ICD-10-CM

## 2020-09-07 DIAGNOSIS — Z79899 Other long term (current) drug therapy: Secondary | ICD-10-CM | POA: Diagnosis not present

## 2020-09-07 DIAGNOSIS — M8438XA Stress fracture, other site, initial encounter for fracture: Secondary | ICD-10-CM | POA: Diagnosis not present

## 2020-09-07 DIAGNOSIS — M545 Low back pain, unspecified: Secondary | ICD-10-CM | POA: Diagnosis not present

## 2020-09-07 DIAGNOSIS — N281 Cyst of kidney, acquired: Secondary | ICD-10-CM | POA: Diagnosis not present

## 2020-09-07 DIAGNOSIS — M5412 Radiculopathy, cervical region: Secondary | ICD-10-CM | POA: Diagnosis not present

## 2020-09-07 DIAGNOSIS — G894 Chronic pain syndrome: Secondary | ICD-10-CM | POA: Diagnosis not present

## 2020-09-07 DIAGNOSIS — M25551 Pain in right hip: Secondary | ICD-10-CM | POA: Diagnosis not present

## 2020-09-07 DIAGNOSIS — R110 Nausea: Secondary | ICD-10-CM | POA: Diagnosis not present

## 2020-09-07 DIAGNOSIS — M25559 Pain in unspecified hip: Secondary | ICD-10-CM | POA: Diagnosis not present

## 2020-09-07 DIAGNOSIS — M810 Age-related osteoporosis without current pathological fracture: Secondary | ICD-10-CM | POA: Diagnosis not present

## 2020-09-07 DIAGNOSIS — M5416 Radiculopathy, lumbar region: Secondary | ICD-10-CM | POA: Diagnosis not present

## 2020-09-14 NOTE — Progress Notes (Signed)
Subjective:   April Best is a 70 y.o. female who presents for Medicare Annual (Subsequent) preventive examination.  I connected with April Best today by telephone and verified that I am speaking with the correct person using two identifiers. Location patient: home Location provider: work Persons participating in the virtual visit: patient, provider.   I discussed the limitations, risks, security and privacy concerns of performing an evaluation and management service by telephone and the availability of in person appointments. I also discussed with the patient that there may be a patient responsible charge related to this service. The patient expressed understanding and verbally consented to this telephonic visit.    Interactive audio and video telecommunications were attempted between this provider and patient, however failed, due to patient having technical difficulties OR patient did not have access to video capability.  We continued and completed visit with audio only.   Review of Systems    N/A  Cardiac Risk Factors include: advanced age (>74men, >5 women);hypertension;dyslipidemia     Objective:    Today's Vitals   09/18/20 1404  PainSc: 7    There is no height or weight on file to calculate BMI.  Advanced Directives 09/18/2020 09/13/2019 07/08/2019 07/08/2019 01/08/2019 01/09/2018 11/19/2017  Does Patient Have a Medical Advance Directive? Yes Yes Yes Yes No No Yes  Type of Paramedic of [place];Living will [place];Living will [place];Living will - - - [place];Living will  Does patient want to make changes to medical advance directive? - - No - Patient declined - - - -  Copy of [place] in Chart? No - copy requested No - copy requested No - copy requested - - - No - copy requested  Would patient like information on creating a medical advance directive? - - - - No -  Patient declined No - Patient declined -    Current Medications (verified) Outpatient Encounter Medications as of 09/18/2020  Medication Sig  . azelastine (ASTELIN) 0.1 % nasal spray Place 1 spray into both nostrils daily as needed.  . clobetasol ointment (TEMOVATE) 0.05 % APP TOPICALLY TO GLUTEAL AREA ONCE D PRF DERMATITIS  . diclofenac Sodium (VOLTAREN) 1 % GEL Apply 2 g topically 4 (four) times daily.  . DULoxetine (CYMBALTA) 20 MG capsule Take 20 mg by mouth daily.  Marland Kitchen escitalopram (LEXAPRO) 20 MG tablet Take 1 tablet (20 mg total) by mouth daily.  . fluticasone (FLONASE) 50 MCG/ACT nasal spray SHAKE LIQUID AND USE 2 SPRAYS IN EACH NOSTRIL EVERY DAY  . gabapentin (NEURONTIN) 100 MG capsule Take 100 mg by mouth 2 (two) times daily.  Marland Kitchen HUMIRA PEN 40 MG/0.4ML PNKT as directed. Every other week  . hydrochlorothiazide (HYDRODIURIL) 25 MG tablet Take 1 tablet (25 mg total) by mouth daily.  Marland Kitchen loratadine (CLARITIN) 10 MG tablet Take 10 mg by mouth daily.  Marland Kitchen losartan (COZAAR) 25 MG tablet Take 25 mg by mouth daily.  . methocarbamol (ROBAXIN) 500 MG tablet Take 500 mg by mouth every 8 (eight) hours as needed.  . ondansetron (ZOFRAN) 4 MG tablet TAKE ONE TABLET BY MOUTH EVERY EIGHT HOURS AS NEEDED FOR NAUSEA / VOMITING  . oxyCODONE-acetaminophen (PERCOCET) 10-325 MG tablet Take 1 tablet by mouth every 4 (four) hours as needed for pain.  . predniSONE (DELTASONE) 10 MG tablet Take by mouth. Taper dose  . traZODone (DESYREL) 100 MG tablet Take 1 tablet (100 mg total) by mouth at bedtime.  Marland Kitchen  CALCIUM PO Take by mouth. (Patient not taking: Reported on 09/18/2020)  . Cyanocobalamin (VITAMIN B-12 PO) Take by mouth daily. (Patient not taking: Reported on 09/18/2020)  . etanercept (ENBREL) 50 MG/ML injection Inject into the skin. (Patient not taking: Reported on 09/18/2020)  . meloxicam (MOBIC) 15 MG tablet Take 15 mg by mouth daily. (Patient not taking: Reported on 09/18/2020)  . rosuvastatin (CRESTOR) 5 MG tablet  TAKE 1 TABLET(5 MG) BY MOUTH DAILY (Patient not taking: Reported on 09/18/2020)  . Turmeric-Ginger 150-25 MG CHEW Chew by mouth. (Patient not taking: Reported on 09/18/2020)  . VITAMIN D PO Take by mouth daily. (Patient not taking: Reported on 09/18/2020)   No facility-administered encounter medications on file as of 09/18/2020.    Allergies (verified) Etanercept, Augmentin  [amoxicillin-pot clavulanate], Cefdinir, and Lisinopril   History: Past Medical History:  Diagnosis Date  . Allergy   . Annular oral lichen planus   . Anxiety   . Arthritis   . Depression   . Guaiac positive stools    history  . H. pylori infection   . H/O degenerative disc disease   . 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 History of palpitations   . Hyperlipidemia   . Hypertension   . Migraine   . Osteoarthritis   . Osteoporosis   . Panic disorder   . Psoriasis   . Psoriatic arthritis ([place])   . Rheumatoid arteritis ([place])    Past Surgical History:  Procedure Laterality Date  . ABDOMINAL HYSTERECTOMY  1983   Ovaries have been removed.  . ABDOMINOPLASTY    . APPENDECTOMY    . BREAST BIOPSY Right    neg- core  . COLONOSCOPY  03/2006  . COLONOSCOPY WITH PROPOFOL N/A 11/19/2017   Procedure: COLONOSCOPY WITH PROPOFOL;  Surgeon: Toledo, Benay Pike, MD;  Location: [facility] ENDOSCOPY;  Service: Gastroenterology;  Laterality: N/A;  . RHINOPLASTY  2002  . UPPER GI ENDOSCOPY  2008   Family History  Problem Relation Age of Onset  . Hypertension Mother   . Hyperlipidemia Mother   . Hypothyroidism Mother   . Fibromyalgia Mother   . Lung cancer Father   . Coronary artery disease Father   . Hyperlipidemia Father   . Hypertension Father   . Heart attack Father   . Hyperlipidemia Sister   . Hypertension Sister   . Fibromyalgia Sister   . ADD / ADHD Brother   . Hemochromatosis Sister   . Breast cancer Neg Hx    Social History   Socioeconomic History  . Marital status: Married    Spouse name: Not on file  . Number of  children: 1  . Years of education: Not on file  . Highest education level: High school graduate  Occupational History  . Occupation: retired  Tobacco Use  . Smoking status: Current Every Day Smoker    Packs/day: 0.50    Years: 49.00    Pack years: 24.50    Types: Cigarettes  . Smokeless tobacco: Never Used  . Tobacco comment: pt declines smoking cessation ( smoked since age 65)  Vaping Use  . Vaping Use: Never used  Substance and Sexual Activity  . Alcohol use: Not Currently    Alcohol/week: 0.0 standard drinks  . Drug use: No  . Sexual activity: Yes    Birth control/protection: Surgical  Other Topics Concern  . Not on file  Social History Narrative  . Not on file   Social Determinants of Health   Financial Resource Strain: Low Risk   .  Difficulty of Paying Living Expenses: Not hard at all  Food Insecurity: No Food Insecurity  . Worried About Charity fundraiser in the Last Year: Never true  . Ran Out of Food in the Last Year: Never true  Transportation Needs: No Transportation Needs  . Lack of Transportation (Medical): No  . Lack of Transportation (Non-Medical): No  Physical Activity: Inactive  . Days of Exercise per Week: 0 days  . Minutes of Exercise per Session: 0 min  Stress: Stress Concern Present  . Feeling of Stress : To some extent  Social Connections: Moderately Integrated  . Frequency of Communication with Friends and Family: More than three times a week  . Frequency of Social Gatherings with Friends and Family: More than three times a week  . Attends Religious Services: Never  . Active Member of Clubs or Organizations: Yes  . Attends Archivist Meetings: More than 4 times per year  . Marital Status: Married    Tobacco Counseling Ready to quit: Not Answered Counseling given: Not Answered Comment: pt declines smoking cessation ( smoked since age 81)   Clinical Intake:  Pre-visit preparation completed: Yes  Pain : 0-10 Pain Score: 7   Pain Type: Chronic pain Pain Location: Neck (and back) Pain Descriptors / Indicators: Aching Pain Frequency: Constant Pain Relieving Factors: Taking Percocet as needed for pain.  Pain Relieving Factors: Taking Percocet as needed for pain.  Nutritional Risks: Nausea/ vomitting/ diarrhea (Nausea occasionally due to pain.) Diabetes: No  How often do you need to have someone help you when you read instructions, pamphlets, or other written materials from your doctor or pharmacy?: 1 - Never  Diabetic? No  Interpreter Needed?: No  Information entered by :: [facility], LPN   Activities of Daily Living In your present state of health, do you have any difficulty performing the following activities: 09/18/2020  Hearing? N  Vision? N  Difficulty concentrating or making decisions? N  Walking or climbing stairs? Y  Comment Due to back pain.  Dressing or bathing? N  Doing errands, shopping? N  Preparing Food and eating ? N  Using the Toilet? N  In the past six months, have you accidently leaked urine? Y  Comment Due to urges.  Do you have problems with loss of bowel control? N  Managing your Medications? N  Managing your Finances? N  Housekeeping or managing your Housekeeping? Y  Comment Has assistance with cleaning.  Some recent data might be hidden    Patient Care Team: Brita Romp Dionne Bucy, MD as PCP - General (Family Medicine) Cammie Sickle, MD as Consulting Physician (Internal Medicine) Lonia Farber, MD as Consulting Physician (Internal Medicine) Allyson Sabal, NP as Nurse Practitioner (Pain Medicine) [place], [place] (Optometry) Quintin Alto, MD as Consulting Physician (Rheumatology) Vladimir Crofts, MD as Consulting Physician (Neurology) [name], MD (Urology) Thornton Park, MD as Referring Physician (Orthopedic Surgery) Pilar Jarvis, MD as Referring Physician (Pain Medicine) [place], Jimmy J, MD as Referring Physician (Physical  Medicine and Rehabilitation) Clyde Canterbury, MD as Referring Physician (Otolaryngology)  Indicate any recent Medical Services you may have received from other than [facility] providers in the past year (date may be approximate).     Assessment:   This is a routine wellness examination for Jaquanna.  Hearing/Vision screen No exam data present  Dietary issues and exercise activities discussed: Current Exercise Habits: The patient does not participate in regular exercise at present, Exercise limited by: orthopedic condition(s)  Goals    . Exercise 150 minutes per week (moderate activity)     Recommend to exercise for 3 days a week for at least 30 minutes at a time.     . Quit smoking / using tobacco      Depression Screen PHQ 2/9 Scores 09/18/2020 09/14/2019 09/13/2019 07/08/2018 01/15/2017 01/15/2017 01/04/2015  PHQ - 2 Score 3 2 1  0 0 0 0  PHQ- 9 Score 8 4 - 4 - 4 -    Fall Risk Fall Risk  09/18/2020 09/14/2019 09/13/2019 07/08/2018 03/05/2017  Falls in the past year? 0 0 0 0 No  Number falls in past yr: 0 0 0 - -  Injury with Fall? 0 0 0 - -    FALL RISK PREVENTION PERTAINING TO THE HOME:  Any stairs in or around the home? Yes  If so, are there any without handrails? No  Home free of loose throw rugs in walkways, pet beds, electrical cords, etc? Yes  Adequate lighting in your home to reduce risk of falls? Yes   ASSISTIVE DEVICES UTILIZED TO PREVENT FALLS:  Life alert? No  Use of a cane, walker or w/c? No  Grab bars in the bathroom? No  Shower chair or bench in shower? Yes  Elevated toilet seat or a handicapped toilet? No    Cognitive Function: Normal cognitive status assessed by observation by this Nurse Health Advisor. No abnormalities found.      6CIT Screen 09/13/2019 07/08/2018 01/15/2017  What Year? 0 points 0 points 0 points  What month? 0 points 0 points 0 points  What time? 3 points 0 points 0 points  Count back from 20 0 points 0 points 0 points  Months in reverse 0 points 0  points 0 points  Repeat phrase 0 points 2 points 2 points  Total Score 3 2 2     Immunizations Immunization History  Administered Date(s) Administered  . Influenza Split 02/21/2010, 04/22/2012, 07/15/2016  . Influenza, High Dose Seasonal PF 03/05/2017, 07/08/2018  . Influenza,inj,Quad PF,6+ Mos 03/23/2015  . Moderna Sars-Covid-2 Vaccination 07/23/2019, 08/20/2019  . Pneumococcal Conjugate-13 01/15/2017  . Pneumococcal Polysaccharide-23 04/22/2012, 07/08/2018  . Tdap 02/21/2010  . Zoster 11/04/2013    TDAP status: Due, Education has been provided regarding the importance of this vaccine. Advised may receive this vaccine at local pharmacy or Health Dept. Aware to provide a copy of the vaccination record if obtained from local pharmacy or Health Dept. Verbalized acceptance and understanding.  Flu Vaccine status: Due, Education has been provided regarding the importance of this vaccine. Advised may receive this vaccine at local pharmacy or Health Dept. Aware to provide a copy of the vaccination record if obtained from local pharmacy or Health Dept. Verbalized acceptance and understanding.  Pneumococcal vaccine status: Up to date  Covid-19 vaccine status: Completed vaccines  Qualifies for Shingles Vaccine? Yes   Zostavax completed Yes   Shingrix Completed?: No.    Education has been provided regarding the importance of this vaccine. Patient has been advised to call insurance company to determine out of pocket expense if they have not yet received this vaccine. Advised may also receive vaccine at local pharmacy or Health Dept. Verbalized acceptance and understanding.  Screening Tests Health Maintenance  Topic Date Due  . COVID-19 Vaccine (3 - Booster for Moderna series) 02/20/2020  . TETANUS/TDAP  09/18/2021 (Originally 02/22/2020)  . INFLUENZA VACCINE  01/15/2021  . MAMMOGRAM  08/02/2021  . DEXA SCAN  09/05/2024  . COLONOSCOPY (Pts 45-61yrs  Insurance coverage will need to be confirmed)   11/20/2027  . Hepatitis C Screening  Completed  . PNA vac Low Risk Adult  Completed  . HPV VACCINES  Aged Out    Health Maintenance  Health Maintenance Due  Topic Date Due  . COVID-19 Vaccine (3 - Booster for Moderna series) 02/20/2020    Colorectal cancer screening: Type of screening: Colonoscopy. Completed 11/19/17. Repeat every 10 years  Mammogram status: Ordered today. Pt provided with contact info and advised to call to schedule appt.   Bone Density status: Completed 09/06/19. Results reflect: Bone density results: OSTEOPENIA. Repeat every 5 years.  Lung Cancer Screening: (Low Dose CT Chest recommended if Age 69-80 years, 30 pack-year currently smoking OR have quit w/in 15years.) does qualify however had this completed 08/08/20. Repeat yearly.   Additional Screening:  Hepatitis C Screening: Up to date  Vision Screening: Recommended annual ophthalmology exams for early detection of glaucoma and other disorders of the eye. Is the patient up to date with their annual eye exam?  Yes  Who is the provider or what is the name of the office in which the patient attends annual eye exams? [facility]  If pt is not established with a provider, would they like to be referred to a provider to establish care? No .   Dental Screening: Recommended annual dental exams for proper oral hygiene  Community Resource Referral / Chronic Care Management: CRR required this visit?  No   CCM required this visit?  No      Plan:     I have personally reviewed and noted the following in the patient's chart:   . Medical and social history . Use of alcohol, tobacco or illicit drugs  . Current medications and supplements . Functional ability and status . Nutritional status . Physical activity . Advanced directives . List of other physicians . Hospitalizations, surgeries, and ER visits in previous 12 months . Vitals . Screenings to include cognitive, depression, and falls . Referrals and  appointments  In addition, I have reviewed and discussed with patient certain preventive protocols, quality metrics, and best practice recommendations. A written personalized care plan for preventive services as well as general preventive health recommendations were provided to patient.     Abdulla Pooley [place], [place]   02[birth year]   Nurse Notes: Pt declined receiving a future Covid vaccine.

## 2020-09-15 DIAGNOSIS — R110 Nausea: Secondary | ICD-10-CM | POA: Diagnosis not present

## 2020-09-15 DIAGNOSIS — G894 Chronic pain syndrome: Secondary | ICD-10-CM | POA: Diagnosis not present

## 2020-09-15 DIAGNOSIS — M5416 Radiculopathy, lumbar region: Secondary | ICD-10-CM | POA: Diagnosis not present

## 2020-09-15 DIAGNOSIS — M8438XA Stress fracture, other site, initial encounter for fracture: Secondary | ICD-10-CM | POA: Diagnosis not present

## 2020-09-15 DIAGNOSIS — M545 Low back pain, unspecified: Secondary | ICD-10-CM | POA: Diagnosis not present

## 2020-09-15 DIAGNOSIS — M810 Age-related osteoporosis without current pathological fracture: Secondary | ICD-10-CM | POA: Diagnosis not present

## 2020-09-15 DIAGNOSIS — N281 Cyst of kidney, acquired: Secondary | ICD-10-CM | POA: Diagnosis not present

## 2020-09-15 DIAGNOSIS — M25559 Pain in unspecified hip: Secondary | ICD-10-CM | POA: Diagnosis not present

## 2020-09-15 DIAGNOSIS — M5481 Occipital neuralgia: Secondary | ICD-10-CM | POA: Diagnosis not present

## 2020-09-15 DIAGNOSIS — M5412 Radiculopathy, cervical region: Secondary | ICD-10-CM | POA: Diagnosis not present

## 2020-09-15 DIAGNOSIS — Z79899 Other long term (current) drug therapy: Secondary | ICD-10-CM | POA: Diagnosis not present

## 2020-09-18 ENCOUNTER — Other Ambulatory Visit: Payer: Self-pay

## 2020-09-18 ENCOUNTER — Ambulatory Visit (INDEPENDENT_AMBULATORY_CARE_PROVIDER_SITE_OTHER): Payer: PPO

## 2020-09-18 DIAGNOSIS — Z1231 Encounter for screening mammogram for malignant neoplasm of breast: Secondary | ICD-10-CM | POA: Diagnosis not present

## 2020-09-18 DIAGNOSIS — Z0000 Encounter for general adult medical examination without abnormal findings: Secondary | ICD-10-CM | POA: Diagnosis not present

## 2020-09-18 NOTE — Patient Instructions (Signed)
April Best , Thank you for taking time to come for your Medicare Wellness Visit. I appreciate your ongoing commitment to your health goals. Please review the following plan we discussed and let me know if I can assist you in the future.   Screening recommendations/referrals: Colonoscopy: Up to date, due 11/2027 Mammogram: Ordered today. Pt aware to contact office to schedule apt. Bone Density: Up to date, due 08/2024 Recommended yearly ophthalmology/optometry visit for glaucoma screening and checkup Recommended yearly dental visit for hygiene and checkup  Vaccinations: Influenza vaccine: Due fall 2022 Pneumococcal vaccine: Completed series Tdap vaccine: Currently due, declined receiving.  Shingles vaccine: Shingrix discussed. Please contact your pharmacy for coverage information.     Advanced directives: Please bring a copy of your POA (Power of Attorney) and/or Living Will to your next appointment.   Conditions/risks identified: Smoking cessation discussed today. Recommend to increase water intake.  Next appointment: 02/05/21 @ 1:20 PM with Dr Brita Romp   Preventive Care 69 Years and Older, Female Preventive care refers to lifestyle choices and visits with your health care provider that can promote health and wellness. What does preventive care include?  A yearly physical exam. This is also called an annual well check.  Dental exams once or twice a year.  Routine eye exams. Ask your health care provider how often you should have your eyes checked.  Personal lifestyle choices, including:  Daily care of your teeth and gums.  Regular physical activity.  Eating a healthy diet.  Avoiding tobacco and drug use.  Limiting alcohol use.  Practicing safe sex.  Taking low-dose aspirin every day.  Taking vitamin and mineral supplements as recommended by your health care provider. What happens during an annual well check? The services and screenings done by your health care  provider during your annual well check will depend on your age, overall health, lifestyle risk factors, and family history of disease. Counseling  Your health care provider may ask you questions about your:  Alcohol use.  Tobacco use.  Drug use.  Emotional well-being.  Home and relationship well-being.  Sexual activity.  Eating habits.  History of falls.  Memory and ability to understand (cognition).  Work and work Statistician.  Reproductive health. Screening  You may have the following tests or measurements:  Height, weight, and BMI.  Blood pressure.  Lipid and cholesterol levels. These may be checked every 5 years, or more frequently if you are over 50 years old.  Skin check.  Lung cancer screening. You may have this screening every year starting at age 69 if you have a 30-pack-year history of smoking and currently smoke or have quit within the past 15 years.  Fecal occult blood test (FOBT) of the stool. You may have this test every year starting at age 69.  Flexible sigmoidoscopy or colonoscopy. You may have a sigmoidoscopy every 5 years or a colonoscopy every 10 years starting at age 69.  Hepatitis C blood test.  Hepatitis B blood test.  Sexually transmitted disease (STD) testing.  Diabetes screening. This is done by checking your blood sugar (glucose) after you have not eaten for a while (fasting). You may have this done every 1-3 years.  Bone density scan. This is done to screen for osteoporosis. You may have this done starting at age 69.  Mammogram. This may be done every 1-2 years. Talk to your health care provider about how often you should have regular mammograms. Talk with your health care provider about your test results, treatment  options, and if necessary, the need for more tests. Vaccines  Your health care provider may recommend certain vaccines, such as:  Influenza vaccine. This is recommended every year.  Tetanus, diphtheria, and acellular  pertussis (Tdap, Td) vaccine. You may need a Td booster every 10 years.  Zoster vaccine. You may need this after age 69.  Pneumococcal 13-valent conjugate (PCV13) vaccine. One dose is recommended after age 69.  Pneumococcal polysaccharide (PPSV23) vaccine. One dose is recommended after age 69. Talk to your health care provider about which screenings and vaccines you need and how often you need them. This information is not intended to replace advice given to you by your health care provider. Make sure you discuss any questions you have with your health care provider. Document Released: 06/30/2015 Document Revised: 02/21/2016 Document Reviewed: 04/04/2015 Elsevier Interactive Patient Education  2017 [place] Prevention in the Home Falls can cause injuries. They can happen to people of all ages. There are many things you can do to make your home safe and to help prevent falls. What can I do on the outside of my home?  Regularly fix the edges of walkways and driveways and fix any cracks.  Remove anything that might make you trip as you walk through a door, such as a raised step or threshold.  Trim any bushes or trees on the path to your home.  Use bright outdoor lighting.  Clear any walking paths of anything that might make someone trip, such as rocks or tools.  Regularly check to see if handrails are loose or broken. Make sure that both sides of any steps have handrails.  Any raised decks and porches should have guardrails on the edges.  Have any leaves, snow, or ice cleared regularly.  Use sand or salt on walking paths during winter.  Clean up any spills in your garage right away. This includes oil or grease spills. What can I do in the bathroom?  Use night lights.  Install grab bars by the toilet and in the tub and shower. Do not use towel bars as grab bars.  Use non-skid mats or decals in the tub or shower.  If you need to sit down in the shower, use a plastic,  non-slip stool.  Keep the floor dry. Clean up any water that spills on the floor as soon as it happens.  Remove soap buildup in the tub or shower regularly.  Attach bath mats securely with double-sided non-slip rug tape.  Do not have throw rugs and other things on the floor that can make you trip. What can I do in the bedroom?  Use night lights.  Make sure that you have a light by your bed that is easy to reach.  Do not use any sheets or blankets that are too big for your bed. They should not hang down onto the floor.  Have a firm chair that has side arms. You can use this for support while you get dressed.  Do not have throw rugs and other things on the floor that can make you trip. What can I do in the kitchen?  Clean up any spills right away.  Avoid walking on wet floors.  Keep items that you use a lot in easy-to-reach places.  If you need to reach something above you, use a strong step stool that has a grab bar.  Keep electrical cords out of the way.  Do not use floor polish or wax that makes floors slippery.  If you must use wax, use non-skid floor wax.  Do not have throw rugs and other things on the floor that can make you trip. What can I do with my stairs?  Do not leave any items on the stairs.  Make sure that there are handrails on both sides of the stairs and use them. Fix handrails that are broken or loose. Make sure that handrails are as long as the stairways.  Check any carpeting to make sure that it is firmly attached to the stairs. Fix any carpet that is loose or worn.  Avoid having throw rugs at the top or bottom of the stairs. If you do have throw rugs, attach them to the floor with carpet tape.  Make sure that you have a light switch at the top of the stairs and the bottom of the stairs. If you do not have them, ask someone to add them for you. What else can I do to help prevent falls?  Wear shoes that:  Do not have high heels.  Have rubber  bottoms.  Are comfortable and fit you well.  Are closed at the toe. Do not wear sandals.  If you use a stepladder:  Make sure that it is fully opened. Do not climb a closed stepladder.  Make sure that both sides of the stepladder are locked into place.  Ask someone to hold it for you, if possible.  Clearly mark and make sure that you can see:  Any grab bars or handrails.  First and last steps.  Where the edge of each step is.  Use tools that help you move around (mobility aids) if they are needed. These include:  Canes.  Walkers.  Scooters.  Crutches.  Turn on the lights when you go into a dark area. Replace any light bulbs as soon as they burn out.  Set up your furniture so you have a clear path. Avoid moving your furniture around.  If any of your floors are uneven, fix them.  If there are any pets around you, be aware of where they are.  Review your medicines with your doctor. Some medicines can make you feel dizzy. This can increase your chance of falling. Ask your doctor what other things that you can do to help prevent falls. This information is not intended to replace advice given to you by your health care provider. Make sure you discuss any questions you have with your health care provider. Document Released: 03/30/2009 Document Revised: 11/09/2015 Document Reviewed: 07/08/2014 Elsevier Interactive Patient Education  2017 [employer].

## 2020-09-25 DIAGNOSIS — M542 Cervicalgia: Secondary | ICD-10-CM | POA: Diagnosis not present

## 2020-09-25 DIAGNOSIS — M545 Low back pain, unspecified: Secondary | ICD-10-CM | POA: Diagnosis not present

## 2020-09-25 DIAGNOSIS — M7061 Trochanteric bursitis, right hip: Secondary | ICD-10-CM | POA: Diagnosis not present

## 2020-10-03 ENCOUNTER — Ambulatory Visit: Payer: Self-pay

## 2020-10-03 NOTE — Telephone Encounter (Signed)
Pt c/o left side soreness and raised area of swelling located under left breast on rib. Pt stated that the pain is constant and is getting worse.  Part of rib is tender to touch and swollen. Pt stated she can leave an indention when she presses down on swelling. Referred pain across stomach and back.  Pain started 09/28/20, no bruise, not red. Looks like a "puffy place" under breast Left side- Has tried ice and heat without effect. Movement make it hurt. Describes as a sharp pain 10/10-- no bites or rash.  Pt goes to pain clinic and the pain is so bad that her scheduled Percocet has no effect on the pain.  Pt stated it hurts to bear down with BM, sneeze, or any movement.  Denies SOB, radiation, vomiting,   Reason for Disposition . Taking a deep breath makes pain worse    Bearing down to have BM, movement, coughing or sneezing makes it hurt  Answer Assessment - Initial Assessment Questions 1. LOCATION: "Where does it hurt?"       Under left breast on rib  2. RADIATION: "Does the pain go anywhere else?" (e.g., into neck, jaw, arms, back)    Across stomach, and back 3. ONSET: "When did the chest pain begin?" (Minutes, hours or days)      09/28/20 4. PATTERN "Does the pain come and go, or has it been constant since it started?"  "Does it get worse with exertion?"      constant 5. DURATION: "How long does it last" (e.g., seconds, minutes, hours)     constant 6. SEVERITY: "How bad is the pain?"  (e.g., Scale 1-10; mild, moderate, or severe)    - MILD (1-3): doesn't interfere with normal activities     - MODERATE (4-7): interferes with normal activities or awakens from sleep    - SEVERE (8-10): excruciating pain, unable to do any normal activities      Severe  7. CARDIAC RISK FACTORS: "Do you have any history of heart problems or risk factors for heart disease?" (e.g., angina, prior heart attack; diabetes, high blood pressure, high cholesterol, smoker, or strong family history of heart disease)      No 8. PULMONARY RISK FACTORS: "Do you have any history of lung disease?"  (e.g., blood clots in lung, asthma, emphysema, birth control pills)     no 9. CAUSE: "What do you think is causing the chest pain?"     10. OTHER SYMPTOMS: "Do you have any other symptoms?" (e.g., dizziness, nausea, vomiting, sweating, fever, difficulty breathing, cough)      no 11. PREGNANCY: "Is there any chance you are pregnant?" "When was your last menstrual period?"      n.a  Protocols used: CHEST PAIN-A-AH

## 2020-10-04 DIAGNOSIS — S2242XA Multiple fractures of ribs, left side, initial encounter for closed fracture: Secondary | ICD-10-CM | POA: Diagnosis not present

## 2020-10-05 DIAGNOSIS — M5481 Occipital neuralgia: Secondary | ICD-10-CM | POA: Diagnosis not present

## 2020-10-11 ENCOUNTER — Encounter: Payer: Self-pay | Admitting: Family Medicine

## 2020-10-11 ENCOUNTER — Other Ambulatory Visit: Payer: Self-pay

## 2020-10-11 ENCOUNTER — Ambulatory Visit
Admission: RE | Admit: 2020-10-11 | Discharge: 2020-10-11 | Disposition: A | Discharge status: Home / Self Care | Payer: PPO | Attending: Family Medicine | Admitting: Family Medicine

## 2020-10-11 ENCOUNTER — Ambulatory Visit (INDEPENDENT_AMBULATORY_CARE_PROVIDER_SITE_OTHER): Payer: PPO | Admitting: Family Medicine

## 2020-10-11 ENCOUNTER — Ambulatory Visit
Admission: RE | Admit: 2020-10-11 | Discharge: 2020-10-11 | Disposition: A | Discharge status: Home / Self Care | Payer: PPO | Source: Ambulatory Visit | Attending: Family Medicine | Admitting: Family Medicine

## 2020-10-11 VITALS — BP 144/89 | HR 93 | Temp 98.5°F | Wt 113.0 lb

## 2020-10-11 DIAGNOSIS — M8448XD Pathological fracture, other site, subsequent encounter for fracture with routine healing: Secondary | ICD-10-CM

## 2020-10-11 DIAGNOSIS — Q676 Pectus excavatum: Secondary | ICD-10-CM | POA: Diagnosis not present

## 2020-10-11 DIAGNOSIS — R079 Chest pain, unspecified: Secondary | ICD-10-CM | POA: Diagnosis not present

## 2020-10-11 NOTE — Patient Instructions (Signed)

## 2020-10-11 NOTE — Progress Notes (Signed)
Established patient visit   Patient: April Best   DOB: 02/29/1951   70 y.o. Female  MRN: [id number] Visit Date: 10/11/2020  Today's healthcare provider: Laurita Quint Abreanna Drawdy, FNP   Chief Complaint  Patient presents with  . Follow-up    Urgent Care follow up    Subjective    HPI HPI    Follow-up     Additional comments: Urgent Care follow up        Last edited by Kizzie Furnish, CMA on 10/11/2020  8:07 AM. (History)      Follow up for rib fracture  The patient was last seen for this 1 weeks ago. Changes made at last visit include RX for Diclofenac.  She reports excellent compliance with treatment. She feels that condition is Improved. She is not having side effects.   Denies any falls or trauma causing the rib fxs Still slightly sore Has been taking as little of the pain medication as possible Would like a repeat xray due to poor image quality of last xray and disagreements between if there was truly a rib fracture  -----------------------------------------------------------------------------------------    Patient Active Problem List   Diagnosis Date Noted  . Rheumatoid arthritis with positive rheumatoid factor ([place]) 01/25/2020  . Proteinuria 12/01/2019  . Simple renal cyst 12/01/2019  . Age-related osteoporosis without current pathological fracture 10/13/2019  . Alcohol dependence, in remission ([place]) 09/14/2019  . Degeneration of lumbar intervertebral disc 07/27/2019  . Low back pain 07/12/2019  . Emphysema of lung ([place]) 07/24/2018  . Macrocytosis without anemia 03/29/2016  . Elevated glucose 02/13/2015  . [name] ([place]) 12/14/2014  . Recurrent major depressive episodes ([place]) 11/02/2014  . Family history of hemochromatosis 11/02/2014  . Hypercholesteremia 11/02/2014  . Benign hypertension 11/02/2014  . Insomnia 11/02/2014  . Current tobacco use 11/02/2014  . Oral lichen planus 53/64/6803  . Episodic paroxysmal anxiety disorder  10/02/2007  . Gastrointestinal ulcer due to Helicobacter pylori 21/22/4825  . Headache, migraine 12/07/2004  . Allergic rhinitis 11/19/2004  . Psoriasis 07/31/2004   Past Medical History:  Diagnosis Date  . Allergy   . Annular oral lichen planus   . Anxiety   . Arthritis   . Depression   . Guaiac positive stools    history  . H. pylori infection   . H/O degenerative disc disease   . 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 History of palpitations   . Hyperlipidemia   . Hypertension   . Migraine   . Osteoarthritis   . Osteoporosis   . Panic disorder   . Psoriasis   . Psoriatic arthritis ([place])   . Rheumatoid arteritis ([facility])    Social History   Tobacco Use  . Smoking status: Current Every Day Smoker    Packs/day: 0.50    Years: 49.00    Pack years: 24.50    Types: Cigarettes  . Smokeless tobacco: Never Used  . Tobacco comment: pt declines smoking cessation ( smoked since age 44)  Vaping Use  . Vaping Use: Never used  Substance Use Topics  . Alcohol use: Not Currently    Alcohol/week: 0.0 standard drinks  . Drug use: No   Allergies  Allergen Reactions  . Etanercept Hives    Other reaction(s): Muscle Cramps  . Augmentin  [Amoxicillin-Pot Clavulanate] Nausea And Vomiting    as stated (XR).  . Cefdinir Diarrhea and Other (See Comments)    Other reaction(s): GI intolerance, Other (see comments) Gives her Cdiff Gives her Cdiff   .  Lisinopril Cough     Medications: Outpatient Medications Prior to Visit  Medication Sig  . azelastine (ASTELIN) 0.1 % nasal spray Place 1 spray into both nostrils daily as needed.  Marland Kitchen CALCIUM PO Take by mouth. (Patient not taking: Reported on 09/18/2020)  . clobetasol ointment (TEMOVATE) 0.05 % APP TOPICALLY TO GLUTEAL AREA ONCE D PRF DERMATITIS  . Cyanocobalamin (VITAMIN B-12 PO) Take by mouth daily. (Patient not taking: Reported on 09/18/2020)  . diclofenac Sodium (VOLTAREN) 1 % GEL Apply 2 g topically 4 (four) times daily.  . DULoxetine  (CYMBALTA) 20 MG capsule Take 20 mg by mouth daily.  Marland Kitchen escitalopram (LEXAPRO) 20 MG tablet Take 1 tablet (20 mg total) by mouth daily.  Marland Kitchen etanercept (ENBREL) 50 MG/ML injection Inject into the skin. (Patient not taking: Reported on 09/18/2020)  . fluticasone (FLONASE) 50 MCG/ACT nasal spray SHAKE LIQUID AND USE 2 SPRAYS IN EACH NOSTRIL EVERY DAY  . gabapentin (NEURONTIN) 100 MG capsule Take 100 mg by mouth 2 (two) times daily.  Marland Kitchen HUMIRA PEN 40 MG/0.4ML PNKT as directed. Every other week  . hydrochlorothiazide (HYDRODIURIL) 25 MG tablet Take 1 tablet (25 mg total) by mouth daily.  Marland Kitchen loratadine (CLARITIN) 10 MG tablet Take 10 mg by mouth daily.  Marland Kitchen losartan (COZAAR) 25 MG tablet Take 25 mg by mouth daily.  . meloxicam (MOBIC) 15 MG tablet Take 15 mg by mouth daily. (Patient not taking: Reported on 09/18/2020)  . methocarbamol (ROBAXIN) 500 MG tablet Take 500 mg by mouth every 8 (eight) hours as needed.  . ondansetron (ZOFRAN) 4 MG tablet TAKE ONE TABLET BY MOUTH EVERY EIGHT HOURS AS NEEDED FOR NAUSEA / VOMITING  . oxyCODONE-acetaminophen (PERCOCET) 10-325 MG tablet Take 1 tablet by mouth every 4 (four) hours as needed for pain.  . predniSONE (DELTASONE) 10 MG tablet Take by mouth. Taper dose  . rosuvastatin (CRESTOR) 5 MG tablet TAKE 1 TABLET(5 MG) BY MOUTH DAILY (Patient not taking: Reported on 09/18/2020)  . traZODone (DESYREL) 100 MG tablet Take 1 tablet (100 mg total) by mouth at bedtime.  . Turmeric-Ginger 150-25 MG CHEW Chew by mouth. (Patient not taking: Reported on 09/18/2020)  . VITAMIN D PO Take by mouth daily. (Patient not taking: Reported on 09/18/2020)   No facility-administered medications prior to visit.    Review of Systems  Constitutional: Negative.   Respiratory: Negative.   Neurological: Negative for dizziness, light-headedness and headaches.        Objective    BP (!) 144/89 (BP Location: Left Arm, Patient Position: Sitting, Cuff Size: Normal)   Pulse 93   Temp 98.5 F (36.9  C) (Oral)   Wt 113 lb (51.3 kg)   BMI 21.35 kg/m    Physical Exam Constitutional:      General: She is not in acute distress.    Appearance: Normal appearance. She is not ill-appearing.  HENT:     Head: Normocephalic.  Cardiovascular:     Rate and Rhythm: Normal rate and regular rhythm.     Pulses: Normal pulses.     Heart sounds: Normal heart sounds. No murmur heard. No friction rub. No gallop.   Pulmonary:     Effort: Pulmonary effort is normal. No respiratory distress.     Breath sounds: Normal breath sounds. No stridor. No wheezing, rhonchi or rales.  Abdominal:     General: Bowel sounds are normal.     Palpations: Abdomen is soft.     Tenderness: There is no abdominal tenderness.  Musculoskeletal:     Right lower leg: No edema.     Left lower leg: No edema.  Skin:    General: Skin is warm and dry.  Neurological:     Mental Status: She is alert and oriented to person, place, and time.  Psychiatric:        Mood and Affect: Mood normal.        Behavior: Behavior normal.       No results found for any visits on 10/11/20.  Assessment & Plan     Problem List Items Addressed This Visit   None   Visit Diagnoses    Pathological fracture of rib with routine healing, subsequent encounter    -  Primary   Relevant Orders   DG Chest 2 View     Will follow up with imaging results Continue current regimen for pain Discussed deep breathing and RTC/ED precautions  Return in about 6 months (around 04/12/2021).       [place], [place] [phone] (phone) [phone] (fax)  [place]

## 2020-10-18 DIAGNOSIS — M25559 Pain in unspecified hip: Secondary | ICD-10-CM | POA: Diagnosis not present

## 2020-10-18 DIAGNOSIS — R110 Nausea: Secondary | ICD-10-CM | POA: Diagnosis not present

## 2020-10-18 DIAGNOSIS — N281 Cyst of kidney, acquired: Secondary | ICD-10-CM | POA: Diagnosis not present

## 2020-10-18 DIAGNOSIS — M5412 Radiculopathy, cervical region: Secondary | ICD-10-CM | POA: Diagnosis not present

## 2020-10-18 DIAGNOSIS — G894 Chronic pain syndrome: Secondary | ICD-10-CM | POA: Diagnosis not present

## 2020-10-18 DIAGNOSIS — M5416 Radiculopathy, lumbar region: Secondary | ICD-10-CM | POA: Diagnosis not present

## 2020-10-18 DIAGNOSIS — M545 Low back pain, unspecified: Secondary | ICD-10-CM | POA: Diagnosis not present

## 2020-10-18 DIAGNOSIS — Z79899 Other long term (current) drug therapy: Secondary | ICD-10-CM | POA: Diagnosis not present

## 2020-10-18 DIAGNOSIS — M810 Age-related osteoporosis without current pathological fracture: Secondary | ICD-10-CM | POA: Diagnosis not present

## 2020-10-18 DIAGNOSIS — M8438XA Stress fracture, other site, initial encounter for fracture: Secondary | ICD-10-CM | POA: Diagnosis not present

## 2020-10-18 DIAGNOSIS — M5481 Occipital neuralgia: Secondary | ICD-10-CM | POA: Diagnosis not present

## 2020-10-23 DIAGNOSIS — M5459 Other low back pain: Secondary | ICD-10-CM | POA: Diagnosis not present

## 2020-10-23 DIAGNOSIS — M542 Cervicalgia: Secondary | ICD-10-CM | POA: Diagnosis not present

## 2020-10-26 DIAGNOSIS — E278 Other specified disorders of adrenal gland: Secondary | ICD-10-CM | POA: Diagnosis not present

## 2020-10-26 DIAGNOSIS — M810 Age-related osteoporosis without current pathological fracture: Secondary | ICD-10-CM | POA: Diagnosis not present

## 2020-11-08 DIAGNOSIS — M542 Cervicalgia: Secondary | ICD-10-CM | POA: Diagnosis not present

## 2020-11-08 DIAGNOSIS — M5459 Other low back pain: Secondary | ICD-10-CM | POA: Diagnosis not present

## 2020-11-22 DIAGNOSIS — M542 Cervicalgia: Secondary | ICD-10-CM | POA: Diagnosis not present

## 2020-11-22 DIAGNOSIS — M5459 Other low back pain: Secondary | ICD-10-CM | POA: Diagnosis not present

## 2020-12-11 ENCOUNTER — Other Ambulatory Visit: Payer: Self-pay | Admitting: Family Medicine

## 2020-12-11 DIAGNOSIS — F410 Panic disorder [episodic paroxysmal anxiety] without agoraphobia: Secondary | ICD-10-CM

## 2020-12-21 DIAGNOSIS — Z79899 Other long term (current) drug therapy: Secondary | ICD-10-CM | POA: Diagnosis not present

## 2020-12-21 DIAGNOSIS — M539 Dorsopathy, unspecified: Secondary | ICD-10-CM | POA: Diagnosis not present

## 2020-12-21 DIAGNOSIS — M810 Age-related osteoporosis without current pathological fracture: Secondary | ICD-10-CM | POA: Diagnosis not present

## 2020-12-21 DIAGNOSIS — L4050 Arthropathic psoriasis, unspecified: Secondary | ICD-10-CM | POA: Diagnosis not present

## 2020-12-21 DIAGNOSIS — L409 Psoriasis, unspecified: Secondary | ICD-10-CM | POA: Diagnosis not present

## 2020-12-22 DIAGNOSIS — Z79899 Other long term (current) drug therapy: Secondary | ICD-10-CM | POA: Diagnosis not present

## 2020-12-22 DIAGNOSIS — M5459 Other low back pain: Secondary | ICD-10-CM | POA: Diagnosis not present

## 2020-12-22 DIAGNOSIS — M5412 Radiculopathy, cervical region: Secondary | ICD-10-CM | POA: Diagnosis not present

## 2020-12-22 DIAGNOSIS — M810 Age-related osteoporosis without current pathological fracture: Secondary | ICD-10-CM | POA: Diagnosis not present

## 2020-12-22 DIAGNOSIS — M25559 Pain in unspecified hip: Secondary | ICD-10-CM | POA: Diagnosis not present

## 2020-12-22 DIAGNOSIS — R110 Nausea: Secondary | ICD-10-CM | POA: Diagnosis not present

## 2020-12-22 DIAGNOSIS — M5416 Radiculopathy, lumbar region: Secondary | ICD-10-CM | POA: Diagnosis not present

## 2020-12-22 DIAGNOSIS — M8438XA Stress fracture, other site, initial encounter for fracture: Secondary | ICD-10-CM | POA: Diagnosis not present

## 2020-12-22 DIAGNOSIS — M5481 Occipital neuralgia: Secondary | ICD-10-CM | POA: Diagnosis not present

## 2020-12-22 DIAGNOSIS — G894 Chronic pain syndrome: Secondary | ICD-10-CM | POA: Diagnosis not present

## 2020-12-22 DIAGNOSIS — N281 Cyst of kidney, acquired: Secondary | ICD-10-CM | POA: Diagnosis not present

## 2020-12-25 NOTE — Progress Notes (Signed)
12/26/2020 3:25 PM   April Best Dec 28, 1950 [id number]  Referring provider: Virginia Crews, MD [address] [place],  [place] [address]  Urological history: 1. Left renal cyst -MRI 2021 2.4 cm simple renal cyst  2. rUTI's -contributing factors of age and vaginal atrophy -documented positive urine cultures over the last year  01/25/2020 Pan-sensitive E.coli  02/25/2020 Pan-sensitive E.coli   Chief Complaint  Patient presents with   Dysuria    HPI: April Best is a 70 y.o. female who presents today for frequency and urgency.  She started having frequency, urgency and urge incontinence one week ago.  She feels wet in her underwear constantly.  Patient denies any modifying or aggravating factors.  Patient denies any gross hematuria, dysuria or suprapubic/flank pain.  Patient denies any fevers, chills, nausea or vomiting.    She was given Myrbetriq samples in September 2021, but she did not start that medication.  UA clear.  PVR 12 mL      PMH: Past Medical History:  Diagnosis Date   Allergy    Annular oral lichen planus    Anxiety    Arthritis    Depression    Guaiac positive stools    history   H. 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   History of palpitations    Hyperlipidemia    Hypertension    Migraine    Osteoarthritis    Osteoporosis    Panic disorder    Psoriasis    Psoriatic arthritis ([place])    Rheumatoid arteritis ([place])     Surgical History: Past Surgical History:  Procedure Laterality Date   ABDOMINAL HYSTERECTOMY  1983   Ovaries have been removed.   ABDOMINOPLASTY     APPENDECTOMY     BREAST BIOPSY Right    neg- core   COLONOSCOPY  03/2006   COLONOSCOPY WITH PROPOFOL N/A 11/19/2017   Procedure: COLONOSCOPY WITH PROPOFOL;  Surgeon: Toledo, Benay Pike, MD;  Location: [facility] ENDOSCOPY;  Service: Gastroenterology;  Laterality: N/A;   RHINOPLASTY  2002   UPPER GI ENDOSCOPY   2008    Home Medications:  Allergies as of 12/26/2020       Reactions   Etanercept Hives   Other reaction(s): Muscle Cramps   Augmentin  [amoxicillin-pot Clavulanate] Nausea And Vomiting   as stated (XR).   Cefdinir Diarrhea, Other (See Comments)   Other reaction(s): GI intolerance, Other (see comments) Gives her Cdiff Gives her Cdiff   Lisinopril Cough        Medication List        Accurate as of December 26, 2020  3:25 PM. If you have any questions, ask your nurse or doctor.          azelastine 0.1 % nasal spray Commonly known as: ASTELIN Place 1 spray into both nostrils daily as needed.   CALCIUM PO Take by mouth.   clobetasol ointment 0.05 % Commonly known as: TEMOVATE APP TOPICALLY TO GLUTEAL AREA ONCE D PRF DERMATITIS   diclofenac Sodium 1 % Gel Commonly known as: VOLTAREN Apply 2 g topically 4 (four) times daily.   DULoxetine 20 MG capsule Commonly known as: CYMBALTA Take 20 mg by mouth daily.   escitalopram 20 MG tablet Commonly known as: LEXAPRO TAKE 1 TABLET BY MOUTH DAILY   etanercept 50 MG/ML injection Commonly known as: ENBREL Inject into the skin.   fluticasone 50 MCG/ACT nasal spray Commonly known as: FLONASE  SHAKE LIQUID AND USE 2 SPRAYS IN EACH NOSTRIL EVERY DAY   gabapentin 100 MG capsule Commonly known as: NEURONTIN Take 100 mg by mouth 2 (two) times daily.   Humira Pen 40 MG/0.4ML Pnkt Generic drug: Adalimumab as directed. Every other week   hydrochlorothiazide 25 MG tablet Commonly known as: HYDRODIURIL TAKE 1 TABLET BY MOUTH DAILY   loratadine 10 MG tablet Commonly known as: CLARITIN Take 10 mg by mouth daily.   losartan 25 MG tablet Commonly known as: COZAAR Take 25 mg by mouth daily.   meloxicam 15 MG tablet Commonly known as: MOBIC Take 15 mg by mouth daily.   methocarbamol 500 MG tablet Commonly known as: ROBAXIN Take 500 mg by mouth every 8 (eight) hours as needed.   ondansetron 4 MG tablet Commonly known  as: ZOFRAN TAKE ONE TABLET BY MOUTH EVERY EIGHT HOURS AS NEEDED FOR NAUSEA / VOMITING   oxyCODONE-acetaminophen 10-325 MG tablet Commonly known as: PERCOCET Take 1 tablet by mouth every 4 (four) hours as needed for pain.   predniSONE 10 MG tablet Commonly known as: DELTASONE Take by mouth. Taper dose   rosuvastatin 5 MG tablet Commonly known as: CRESTOR TAKE 1 TABLET(5 MG) BY MOUTH DAILY   traZODone 100 MG tablet Commonly known as: DESYREL Take 1 tablet (100 mg total) by mouth at bedtime.   Turmeric-Ginger 150-25 MG Chew Chew by mouth.   VITAMIN B-12 PO Take by mouth daily.   VITAMIN D PO Take by mouth daily.        Allergies:  Allergies  Allergen Reactions   Etanercept Hives    Other reaction(s): Muscle Cramps   Augmentin  [Amoxicillin-Pot Clavulanate] Nausea And Vomiting    as stated (XR).   Cefdinir Diarrhea and Other (See Comments)    Other reaction(s): GI intolerance, Other (see comments) Gives her Cdiff Gives her Cdiff    Lisinopril Cough    Family History: Family History  Problem Relation Age of Onset   Hypertension Mother    Hyperlipidemia Mother    Hypothyroidism Mother    Fibromyalgia Mother    Lung cancer Father    Coronary artery disease Father    Hyperlipidemia Father    Hypertension Father    Heart attack Father    Hyperlipidemia Sister    Hypertension Sister    Fibromyalgia Sister    ADD / ADHD Brother    Hemochromatosis Sister    Breast cancer Neg Hx     Social History:  reports that she has been smoking cigarettes. She has a 24.50 pack-year smoking history. She has never used smokeless tobacco. She reports previous alcohol use. She reports that she does not use drugs.  ROS: Pertinent ROS in HPI  Physical Exam: BP 131/81   Pulse 94   Ht 5\' 1"  (1.549 m)   Wt 115 lb (52.2 kg)   BMI 21.73 kg/m   Constitutional:  Well nourished. Alert and oriented, No acute distress. HEENT: [place] AT, mask in place.  Trachea  midline Cardiovascular: No clubbing, cyanosis, or edema. Respiratory: Normal respiratory effort, no increased work of breathing. Neurologic: Grossly intact, no focal deficits, moving all 4 extremities. Psychiatric: Normal mood and affect.    Laboratory Data: Lab Results  Component Value Date   WBC 12.1 (H) 07/10/2020   HGB 15.6 (H) 07/10/2020   HCT 47.1 (H) 07/10/2020   MCV 98.9 07/10/2020   PLT 318 07/10/2020    Lab Results  Component Value Date   CREATININE 0.84 01/20/2020  Lab Results  Component Value Date   HGBA1C 5.8 (H) 02/11/2020    Urinalysis Component     Latest Ref Rng & Units 12/26/2020  Specific Gravity, UA     1.005 - 1.030 1.010  pH, UA     5.0 - 7.5 6.5  Color, UA     Yellow Yellow  Appearance Ur     Clear Clear  Leukocytes,UA     Negative Negative  Protein,UA     Negative/Trace Negative  Glucose, UA     Negative Negative  Ketones, UA     Negative Negative  RBC, UA     Negative Negative  Bilirubin, UA     Negative Negative  Urobilinogen, Ur     0.2 - 1.0 mg/dL 0.2  Nitrite, UA     Negative Negative  Microscopic Examination      See below:   Component     Latest Ref Rng & Units 12/26/2020  WBC, UA     0 - 5 /hpf None seen  RBC     0 - 2 /hpf None seen  Epithelial Cells (non renal)     0 - 10 /hpf None seen  Bacteria, UA     None seen/Few None seen     I have reviewed the labs.   Pertinent Imaging:  Results for MATTALYN, ANDEREGG (MRN [id number]) as of 12/26/2020 15:23  Ref. Range 12/26/2020 15:19  Scan Result Unknown 12    Assessment & Plan:    1. rUTI's -UA is clear -sent in for culture to rule out indolent infection -if urine culture is positive, we will need to prescribe medication and have her return for symptom recheck  2.  Urge incontinence -Discussed with patient that if urine culture is negative that we could either retry the Myrbetriq samples or have her undergo cystoscopy for further evaluation -She would like  to try the Myrbetriq samples if urine culture is negative and then she will return in 3 to 4 weeks for OAB questionnaire and PVR                                             Return for pending urine culture results .  These notes generated with voice recognition software. I apologize for typographical errors.  Zara Council, PA-C  [facility] [address]  [place], [place] [address] [phone]

## 2020-12-26 ENCOUNTER — Ambulatory Visit (INDEPENDENT_AMBULATORY_CARE_PROVIDER_SITE_OTHER): Payer: PPO | Admitting: Urology

## 2020-12-26 ENCOUNTER — Encounter: Payer: Self-pay | Admitting: Urology

## 2020-12-26 ENCOUNTER — Other Ambulatory Visit: Payer: Self-pay

## 2020-12-26 ENCOUNTER — Other Ambulatory Visit: Payer: Self-pay | Admitting: Family Medicine

## 2020-12-26 VITALS — BP 131/81 | HR 94 | Ht 61.0 in | Wt 115.0 lb

## 2020-12-26 DIAGNOSIS — N390 Urinary tract infection, site not specified: Secondary | ICD-10-CM | POA: Diagnosis not present

## 2020-12-26 DIAGNOSIS — I10 Essential (primary) hypertension: Secondary | ICD-10-CM

## 2020-12-26 DIAGNOSIS — N3941 Urge incontinence: Secondary | ICD-10-CM | POA: Diagnosis not present

## 2020-12-26 LAB — URINALYSIS, COMPLETE
Bilirubin, UA: NEGATIVE
Glucose, UA: NEGATIVE
Ketones, UA: NEGATIVE
Leukocytes,UA: NEGATIVE
Nitrite, UA: NEGATIVE
Protein,UA: NEGATIVE
RBC, UA: NEGATIVE
Specific Gravity, UA: 1.01 (ref 1.005–1.030)
Urobilinogen, Ur: 0.2 mg/dL (ref 0.2–1.0)
pH, UA: 6.5 (ref 5.0–7.5)

## 2020-12-26 LAB — MICROSCOPIC EXAMINATION
Bacteria, UA: NONE SEEN
Epithelial Cells (non renal): NONE SEEN /hpf (ref 0–10)
RBC, Urine: NONE SEEN /hpf (ref 0–2)
WBC, UA: NONE SEEN /hpf (ref 0–5)

## 2020-12-26 LAB — BLADDER SCAN AMB NON-IMAGING: Scan Result: 12

## 2020-12-29 ENCOUNTER — Other Ambulatory Visit: Payer: Self-pay | Admitting: *Deleted

## 2020-12-29 LAB — CULTURE, URINE COMPREHENSIVE

## 2020-12-29 MED ORDER — SULFAMETHOXAZOLE-TRIMETHOPRIM 800-160 MG PO TABS: 1.0000 | ORAL_TABLET | Freq: Two times a day (BID) | ORAL | 0 refills | Status: AC

## 2021-01-01 ENCOUNTER — Other Ambulatory Visit: Payer: PPO

## 2021-01-04 DIAGNOSIS — J342 Deviated nasal septum: Secondary | ICD-10-CM | POA: Diagnosis not present

## 2021-01-04 DIAGNOSIS — J3489 Other specified disorders of nose and nasal sinuses: Secondary | ICD-10-CM | POA: Diagnosis not present

## 2021-01-05 ENCOUNTER — Inpatient Hospital Stay: Payer: PPO | Attending: Internal Medicine

## 2021-01-05 ENCOUNTER — Other Ambulatory Visit: Payer: Self-pay

## 2021-01-05 LAB — CBC WITH DIFFERENTIAL/PLATELET
Abs Immature Granulocytes: 0.1 10*3/uL — ABNORMAL HIGH (ref 0.00–0.07)
Basophils Absolute: 0.1 10*3/uL (ref 0.0–0.1)
Basophils Relative: 1 %
Eosinophils Absolute: 0.3 10*3/uL (ref 0.0–0.5)
Eosinophils Relative: 3 %
HCT: 47.5 % — ABNORMAL HIGH (ref 36.0–46.0)
Hemoglobin: 15.4 g/dL — ABNORMAL HIGH (ref 12.0–15.0)
Immature Granulocytes: 1 %
Lymphocytes Relative: 38 %
Lymphs Abs: 3.9 10*3/uL (ref 0.7–4.0)
MCH: 32.6 pg (ref 26.0–34.0)
MCHC: 32.4 g/dL (ref 30.0–36.0)
MCV: 100.6 fL — ABNORMAL HIGH (ref 80.0–100.0)
Monocytes Absolute: 0.6 10*3/uL (ref 0.1–1.0)
Monocytes Relative: 6 %
Neutro Abs: 5.4 10*3/uL (ref 1.7–7.7)
Neutrophils Relative %: 51 %
Platelets: 284 10*3/uL (ref 150–400)
RBC: 4.72 MIL/uL (ref 3.87–5.11)
RDW: 12.8 % (ref 11.5–15.5)
WBC: 10.4 10*3/uL (ref 4.0–10.5)
nRBC: 0 % (ref 0.0–0.2)

## 2021-01-05 LAB — IRON AND TIBC
Iron: 160 ug/dL (ref 28–170)
Saturation Ratios: 51 % — ABNORMAL HIGH (ref 10.4–31.8)
TIBC: 315 ug/dL (ref 250–450)
UIBC: 155 ug/dL

## 2021-01-05 LAB — FERRITIN: Ferritin: 179 ng/mL (ref 11–307)

## 2021-01-12 ENCOUNTER — Other Ambulatory Visit: Payer: Self-pay

## 2021-01-12 ENCOUNTER — Inpatient Hospital Stay: Payer: PPO

## 2021-01-12 ENCOUNTER — Inpatient Hospital Stay (HOSPITAL_BASED_OUTPATIENT_CLINIC_OR_DEPARTMENT_OTHER): Payer: PPO | Admitting: Internal Medicine

## 2021-01-12 ENCOUNTER — Encounter: Payer: Self-pay | Admitting: Internal Medicine

## 2021-01-12 LAB — PROTIME-INR
INR: 0.9 (ref 0.8–1.2)
Prothrombin Time: 12.1 seconds (ref 11.4–15.2)

## 2021-01-12 LAB — APTT: aPTT: 30 seconds (ref 24–36)

## 2021-01-12 NOTE — Assessment & Plan Note (Addendum)
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  Patient continues to be asymptomatic. STABLE  #Do not recommend phlebotomy unless ferritin greater than 150 or iron saturation greater than 50%.  Proceed with phlebotmomy.  phlebotomy today.  # Psoriasis arthritis-on Enbrel [Dr.Patel;[facility]; Rheum- STABLE.   #Mild erythrocytosis hematocrit 46-likely secondary to smoking.  Stable.  # active smoker- [facility]SP-10/22/2020 CT scan reviewed negative for any malignancy.  Again counseled to quit smoking.  #History of alcoholism -again congratulated patient for being sober; [last quit 2019]  # Easy bruising: bil arm- check PT/PTTtoday; not on anti-platlets/ NSAIDs/no chronic steroids.  DISPOSITION: will call  # Phlebotomy today # lab today- PT/PTT # 6 months with labs [facility], iron studies, ferritin; 1 week prior, MD/ possible phlebotomy- Dr.B

## 2021-01-12 NOTE — Patient Instructions (Signed)

## 2021-01-12 NOTE — Progress Notes (Signed)
Removed 350 ml of blood per MD orders for phlebotomy. Pt tolerated procedure well. Accepted a beverage. Feeling well at time of discharge. VSS.

## 2021-01-12 NOTE — Progress Notes (Signed)
[facility] OFFICE PROGRESS NOTE  Patient Care Team: Brita Romp, Dionne Bucy, MD as PCP - General (Family Medicine) Cammie Sickle, MD as Consulting Physician (Internal Medicine) Lonia Farber, MD as Consulting Physician (Internal Medicine) Allyson Sabal, NP as Nurse Practitioner (Pain Medicine) Pa, [place] (Optometry) Quintin Alto, MD as Consulting Physician (Rheumatology) Vladimir Crofts, MD as Consulting Physician (Neurology) [name], MD (Urology) Thornton Park, MD as Referring Physician (Orthopedic Surgery) Pilar Jarvis, MD as Referring Physician (Pain Medicine) [place], Jimmy J, MD as Referring Physician (Physical Medicine and Rehabilitation) Clyde Canterbury, MD as Referring Physician (Otolaryngology)   SUMMARY OF HEMATOLOGIC/ONCOLOGIC HISTORY:  # April 2015- 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 Manuella Ghazi Cymbalta  INTERVAL HISTORY:  A very pleasant 70 year old female patient with above history of  [name] following screening  without any end organ dysfunction is here for follow-up /phlebotomy.   Patient complains of of easy bruising.  With minimal trauma/spontaneous bruising on the upper extremities.  Denies any gum bleeding or nosebleeds.  Denies any rectal bleeding.  Continues to have joint pains related to psoriatic arthritis not any worse.  She is currently on Enbrel.  Unfortunately she continues to smoke.  Review of Systems  Constitutional:  Positive for malaise/fatigue. Negative for chills, diaphoresis, fever and weight loss.  HENT:  Negative for nosebleeds and sore throat.   Eyes:  Negative for double vision.   Respiratory:  Negative for cough, hemoptysis, sputum production, shortness of breath and wheezing.   Cardiovascular:  Negative for chest pain, palpitations, orthopnea and leg swelling.  Gastrointestinal:  Negative for abdominal pain, blood in stool, constipation, diarrhea, heartburn, melena, nausea and vomiting.  Genitourinary:  Negative for dysuria, frequency and urgency.  Musculoskeletal:  Positive for back pain, joint pain, myalgias and neck pain.  Skin: Negative.  Negative for itching and rash.  Neurological:  Negative for dizziness, tingling, focal weakness, weakness and headaches.  Endo/Heme/Allergies:  Bruises/bleeds easily.  Psychiatric/Behavioral:  Negative for depression. The patient is not nervous/anxious and does not have insomnia.      PAST MEDICAL HISTORY :  Past Medical History:  Diagnosis Date  . Allergy   . Annular oral lichen planus   . Anxiety   . Arthritis   . Depression   . Guaiac positive stools    history  . H. pylori infection   . H/O degenerative disc disease   . 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 History of palpitations   . Hyperlipidemia   . Hypertension   . Migraine   . Osteoarthritis   . Osteoporosis   . Panic disorder   . Psoriasis   . Psoriatic arthritis ([place])   . Rheumatoid arteritis ([place])     PAST SURGICAL HISTORY :   Past Surgical History:  Procedure Laterality Date  . ABDOMINAL HYSTERECTOMY  1983   Ovaries have been removed.  . ABDOMINOPLASTY    . APPENDECTOMY    . BREAST BIOPSY Right    neg- core  . COLONOSCOPY  03/2006  . COLONOSCOPY WITH PROPOFOL N/A 11/19/2017   Procedure: COLONOSCOPY WITH PROPOFOL;  Surgeon: Toledo, Benay Pike, MD;  Location: [facility] ENDOSCOPY;  Service: Gastroenterology;  Laterality: N/A;  . RHINOPLASTY  2002  . UPPER GI ENDOSCOPY  2008    FAMILY HISTORY :  Family History  Problem Relation Age of Onset  . Hypertension Mother   . Hyperlipidemia Mother   . Hypothyroidism Mother   . Fibromyalgia Mother   . Lung  cancer Father   . Coronary artery disease Father   . Hyperlipidemia Father   . Hypertension Father   . Heart attack Father   . Hyperlipidemia Sister   . Hypertension Sister   . Fibromyalgia Sister   . ADD / ADHD Brother   . Hemochromatosis Sister   . Breast cancer Neg Hx     SOCIAL HISTORY:   Social History   Tobacco Use  . Smoking status: Every Day    Packs/day: 0.50    Years: 49.00    Pack years: 24.50    Types: Cigarettes  . Smokeless tobacco: Never  . Tobacco comments:    pt declines smoking cessation ( smoked since age 56)  Vaping Use  . Vaping Use: Never used  Substance Use Topics  . Alcohol use: Not Currently    Alcohol/week: 0.0 standard drinks  . Drug use: No    ALLERGIES:  is allergic to etanercept, augmentin  [amoxicillin-pot clavulanate], cefdinir, and lisinopril.  MEDICATIONS:  Current Outpatient Medications  Medication Sig Dispense Refill  . azelastine (ASTELIN) 0.1 % nasal spray Place 1 spray into both nostrils daily as needed.    Marland Kitchen CALCIUM PO Take by mouth. (Patient not taking: No sig reported)    . clobetasol ointment (TEMOVATE) 0.05 % APP TOPICALLY TO GLUTEAL AREA ONCE D PRF DERMATITIS 30 g 1  . Cyanocobalamin (VITAMIN B-12 PO) Take by mouth daily. (Patient not taking: No sig reported)    . diclofenac Sodium (VOLTAREN) 1 % GEL Apply 2 g topically 4 (four) times daily.    . DULoxetine (CYMBALTA) 20 MG capsule Take 20 mg by mouth daily.    Marland Kitchen escitalopram (LEXAPRO) 20 MG tablet TAKE 1 TABLET BY MOUTH DAILY 90 tablet 0  . etanercept (ENBREL) 50 MG/ML injection Inject into the skin. (Patient not taking: Reported on 09/18/2020)    . fluticasone (FLONASE) 50 MCG/ACT nasal spray SHAKE LIQUID AND USE 2 SPRAYS IN EACH NOSTRIL EVERY DAY 16 g 0  . gabapentin (NEURONTIN) 100 MG capsule Take 100 mg by mouth 2 (two) times daily.    Marland Kitchen HUMIRA PEN 40 MG/0.4ML PNKT as directed. Every other week    . hydrochlorothiazide (HYDRODIURIL) 25 MG tablet TAKE 1 TABLET BY MOUTH  DAILY 90 tablet 0  . loratadine (CLARITIN) 10 MG tablet Take 10 mg by mouth daily.    Marland Kitchen losartan (COZAAR) 25 MG tablet Take 25 mg by mouth daily.    . meloxicam (MOBIC) 15 MG tablet Take 15 mg by mouth daily.    . methocarbamol (ROBAXIN) 500 MG tablet Take 500 mg by mouth every 8 (eight) hours as needed.    . ondansetron (ZOFRAN) 4 MG tablet TAKE ONE TABLET BY MOUTH EVERY EIGHT HOURS AS NEEDED FOR NAUSEA / VOMITING 30 tablet 0  . oxyCODONE-acetaminophen (PERCOCET) 10-325 MG tablet Take 1 tablet by mouth every 4 (four) hours as needed for pain.    . predniSONE (DELTASONE) 10 MG tablet Take by mouth. Taper dose    . rosuvastatin (CRESTOR) 5 MG tablet TAKE 1 TABLET(5 MG) BY MOUTH DAILY (Patient not taking: Reported on 01/12/2021) 90 tablet 3  . traZODone (DESYREL) 100 MG tablet Take 1 tablet (100 mg total) by mouth at bedtime. 90 tablet 1  . Turmeric-Ginger 150-25 MG CHEW Chew by mouth. (Patient not  taking: Reported on 01/12/2021)    . VITAMIN D PO Take by mouth daily.     No current facility-administered medications for this visit.    PHYSICAL EXAMINATION: ECOG PERFORMANCE STATUS: 0 - Asymptomatic  BP (P) 125/72 (BP Location: Left Arm, Patient Position: Sitting)   Pulse (P) 65   Temp 98.2 F (36.8 C)   Resp (P) 18   Wt 115 lb (52.2 kg)   BMI 21.73 kg/m   Filed Weights   01/12/21 1352  Weight: 115 lb (52.2 kg)    Physical Exam Constitutional:      Comments: Ambulating independently.  Alone.  HENT:     Head: Normocephalic and atraumatic.     Mouth/Throat:     Pharynx: No oropharyngeal exudate.  Eyes:     Pupils: Pupils are equal, round, and reactive to light.  Cardiovascular:     Rate and Rhythm: Normal rate and regular rhythm.  Pulmonary:     Effort: Pulmonary effort is normal. No respiratory distress.     Breath sounds: Normal breath sounds. No wheezing.  Abdominal:     General: Bowel sounds are normal. There is no distension.     Palpations: Abdomen is soft. There is no  mass.     Tenderness: There is no abdominal tenderness. There is no guarding or rebound.  Musculoskeletal:        General: No tenderness. Normal range of motion.     Cervical back: Normal range of motion and neck supple.  Skin:    General: Skin is warm.  Neurological:     Mental Status: She is alert and oriented to person, place, and time.  Psychiatric:        Mood and Affect: Affect normal.     LABORATORY DATA:  I have reviewed the data as listed    Component Value Date/Time   NA 142 01/20/2020 1327   K 3.6 01/20/2020 1327   CL 101 01/20/2020 1327   CO2 25 01/20/2020 1327   GLUCOSE 113 (H) 01/20/2020 1327   GLUCOSE 92 01/01/2019 1324   BUN 16 01/20/2020 1327   CREATININE 0.84 01/20/2020 1327   CREATININE 0.57 05/05/2017 1653   CALCIUM 9.8 01/20/2020 1327   PROT 6.6 01/20/2020 1327   ALBUMIN 4.5 01/20/2020 1327   AST 18 01/20/2020 1327   ALT 19 01/20/2020 1327   ALKPHOS 57 01/20/2020 1327   BILITOT 0.3 01/20/2020 1327   GFRNONAA 72 01/20/2020 1327   GFRNONAA 97 05/05/2017 1653   GFRAA 83 01/20/2020 1327   GFRAA 113 05/05/2017 1653    No results found for: SPEP, UPEP  Lab Results  Component Value Date   WBC 10.4 01/05/2021   NEUTROABS 5.4 01/05/2021   HGB 15.4 (H) 01/05/2021   HCT 47.5 (H) 01/05/2021   MCV 100.6 (H) 01/05/2021   PLT 284 01/05/2021      Chemistry      Component Value Date/Time   NA 142 01/20/2020 1327   K 3.6 01/20/2020 1327   CL 101 01/20/2020 1327   CO2 25 01/20/2020 1327   BUN 16 01/20/2020 1327   CREATININE 0.84 01/20/2020 1327   CREATININE 0.57 05/05/2017 1653   GLU 102 09/05/2014 0000      Component Value Date/Time   CALCIUM 9.8 01/20/2020 1327   ALKPHOS 57 01/20/2020 1327   AST 18 01/20/2020 1327   ALT 19 01/20/2020 1327   BILITOT 0.3 01/20/2020 1327       ASSESSMENT & PLAN:   Hereditary  hemochromatosis (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   Patient continues to be asymptomatic. STABLE  #Do not recommend phlebotomy unless ferritin greater than 150 or iron saturation greater than 50%.  Proceed with phlebotmomy.  phlebotomy today.  # Psoriasis arthritis-on Enbrel [Dr.Patel;[facility]; Rheum- STABLE.   #Mild erythrocytosis hematocrit 46-likely secondary to smoking.  Stable.  # active smoker- [facility]SP-10/22/2020 CT scan reviewed negative for any malignancy.  Again counseled to quit smoking.  #History of alcoholism -again congratulated patient for being sober; [last quit 2019]  # Easy bruising: bil arm- check PT/PTTtoday; not on anti-platlets/ NSAIDs/no chronic steroids.  DISPOSITION: will call  # Phlebotomy today # lab today- PT/PTT # 6 months with labs [facility], iron studies, ferritin; 1 week prior, MD/ possible phlebotomy- Dr.B      Cammie Sickle, MD 01/13/2021 10:56 AM

## 2021-01-13 ENCOUNTER — Encounter: Payer: Self-pay | Admitting: Internal Medicine

## 2021-01-25 ENCOUNTER — Other Ambulatory Visit: Payer: Self-pay

## 2021-01-25 ENCOUNTER — Encounter: Payer: Self-pay | Admitting: Otolaryngology

## 2021-01-26 DIAGNOSIS — M25561 Pain in right knee: Secondary | ICD-10-CM | POA: Diagnosis not present

## 2021-01-26 DIAGNOSIS — M5416 Radiculopathy, lumbar region: Secondary | ICD-10-CM | POA: Diagnosis not present

## 2021-01-29 NOTE — Discharge Instructions (Signed)
[facility] ENDOSCOPIC SINUS SURGERY [facility]  What is Functional Endoscopic Sinus Surgery?  The Surgery involves making the natural openings of the sinuses larger by removing the bony partitions that separate the sinuses from the nasal cavity.  The natural sinus lining is preserved as much as possible to allow the sinuses to resume normal function after the surgery.  In some patients nasal polyps (excessively swollen lining of the sinuses) may be removed to relieve obstruction of the sinus openings.  The surgery is performed through the nose using lighted scopes, which eliminates the need for incisions on the face.  A septoplasty is a different procedure which is sometimes performed with sinus surgery.  It involves straightening the boy partition that separates the two sides of your nose.  A crooked or deviated septum may need repair if is obstructing the sinuses or nasal airflow.  Turbinate reduction is also often performed during sinus surgery.  The turbinates are bony proturberances from the side walls of the nose which swell and can obstruct the nose in patients with sinus and allergy problems.  Their size can be surgically reduced to help relieve nasal obstruction.  What Can Sinus Surgery Do For Me?  Sinus surgery can reduce the frequency of sinus infections requiring antibiotic treatment.  This can provide improvement in nasal congestion, post-nasal drainage, facial pressure and nasal obstruction.  Surgery will NOT prevent you from ever having an infection again, so it usually only for patients who get infections 4 or more times yearly requiring antibiotics, or for infections that do not clear with antibiotics.  It will not cure nasal allergies, so patients with allergies may still require medication to treat their allergies after surgery. Surgery may improve headaches related to sinusitis, however, some people will continue to  require medication to control sinus headaches related to allergies.  Surgery will do nothing for other forms of headache (migraine, tension or cluster).  What Are the Risks of Endoscopic Sinus Surgery?  Current techniques allow surgery to be performed safely with little risk, however, there are rare complications that patients should be aware of.  Because the sinuses are located around the eyes, there is risk of eye injury, including blindness, though again, this would be quite rare. This is usually a result of bleeding behind the eye during surgery, which puts the vision oat risk, though there are treatments to protect the vision and prevent permanent disrupted by surgery causing a leak of the spinal fluid that surrounds the brain.  More serious complications would include bleeding inside the brain cavity or damage to the brain.  Again, all of these complications are uncommon, and spinal fluid leaks can be safely managed surgically if they occur.  The most common complication of sinus surgery is bleeding from the nose, which may require packing or cauterization of the nose.  Continued sinus have polyps may experience recurrence of the polyps requiring revision surgery.  Alterations of sense of smell or injury to the tear ducts are also rare complications.   What is the Surgery Like, and what is the Recovery?  The Surgery usually takes a couple of hours to perform, and is usually performed under a general anesthetic (completely asleep).  Patients are usually discharged home after a couple of hours.  Sometimes during surgery it is necessary to pack the nose to control bleeding, and the packing is left in place for 24 - 48 hours, and removed by your surgeon.    If a septoplasty was performed during the procedure, there is often a splint placed which must be removed after 5-7 days.   Discomfort: Pain is usually mild to moderate, and can be controlled by prescription pain medication or acetaminophen (Tylenol).   Aspirin, Ibuprofen (Advil, Motrin), or Naprosyn (Aleve) should be avoided, as they can cause increased bleeding.  Most patients feel sinus pressure like they have a bad head cold for several days.  Sleeping with your head elevated can help reduce swelling and facial pressure, as can ice packs over the face.  A humidifier may be helpful to keep the mucous and blood from drying in the nose.   Diet: There are no specific diet restrictions, however, you should generally start with clear liquids and a light diet of bland foods because the anesthetic can cause some nausea.  Advance your diet depending on how your stomach feels.  Taking your pain medication with food will often help reduce stomach upset which pain medications can cause.  Nasal Saline Irrigation: It is important to remove blood clots and dried mucous from the nose as it is healing.  This is done by having you irrigate the nose at least 3 - 4 times daily with a salt water solution.  We recommend using NeilMed Sinus Rinse (available at the drug store).  Fill the squeeze bottle with the solution, bend over a sink, and insert the tip of the squeeze bottle into the nose  of an inch.  Point the tip of the squeeze bottle towards the inside corner of the eye on the same side your irrigating.  Squeeze the bottle and gently irrigate the nose.  If you bend forward as you do this, most of the fluid will flow back out of the nose, instead of down your throat.   The solution should be warm, near body temperature, when you irrigate.   Each time you irrigate, you should use a full squeeze bottle.   Note that if you are instructed to use Nasal Steroid Sprays at any time after your surgery, irrigate with saline BEFORE using the steroid spray, so you do not wash it all out of the nose. Another product, Nasal Saline Gel (such as AYR Nasal Saline Gel) can be applied in each nostril 3 - 4 times daily to moisture the nose and reduce scabbing or crusting.  Bleeding:   Bloody drainage from the nose can be expected for several days, and patients are instructed to irrigate their nose frequently with salt water to help remove mucous and blood clots.  The drainage may be dark red or brown, though some fresh blood may be seen intermittently, especially after irrigation.  Do not blow you nose, as bleeding may occur. If you must sneeze, keep your mouth open to allow air to escape through your mouth.  If heavy bleeding occurs: Irrigate the nose with saline to rinse out clots, then spray the nose 3 - 4 times with Afrin Nasal Decongestant Spray.  The spray will constrict the blood vessels to slow bleeding.  Pinch the lower half of your nose shut to apply pressure, and lay down with your head elevated.  Ice packs over the nose may help as well. If bleeding persists despite these measures, you should notify your doctor.  Do not use the Afrin routinely to control nasal congestion after surgery, as it can result in worsening congestion and may affect healing.     Activity: Return to work varies among patients. Most patients will be   out of work at least 5 - 7 days to recover.  Patient may return to work after they are off of narcotic pain medication, and feeling well enough to perform the functions of their job.  Patients must avoid heavy lifting (over 10 pounds) or strenuous physical for 2 weeks after surgery, so your employer may need to assign you to light duty, or keep you out of work longer if light duty is not possible.  NOTE: you should not drive, operate dangerous machinery, do any mentally demanding tasks or make any important legal or financial decisions while on narcotic pain medication and recovering from the general anesthetic.    Call Your Doctor Immediately if You Have Any of the Following: Bleeding that you cannot control with the above measures Loss of vision, double vision, bulging of the eye or black eyes. Fever over 101 degrees Neck stiffness with severe headache,  fever, nausea and change in mental state. You are always encourage to call anytime with concerns, however, please call with requests for pain medication refills during office hours.  Office Endoscopy: During follow-up visits your doctor will remove any packing or splints that may have been placed and evaluate and clean your sinuses endoscopically.  Topical anesthetic will be used to make this as comfortable as possible, though you may want to take your pain medication prior to the visit.  How often this will need to be done varies from patient to patient.  After complete recovery from the surgery, you may need follow-up endoscopy from time to time, particularly if there is concern of recurrent infection or nasal polyps.   

## 2021-01-30 ENCOUNTER — Ambulatory Visit
Admission: RE | Admit: 2021-01-30 | Discharge: 2021-01-30 | Disposition: A | Discharge status: Home / Self Care | Payer: PPO | Attending: Otolaryngology | Admitting: Otolaryngology

## 2021-01-30 ENCOUNTER — Encounter
Admission: RE | Disposition: A | Discharge status: Home / Self Care | Payer: Self-pay | Source: Home / Self Care | Attending: Otolaryngology

## 2021-01-30 ENCOUNTER — Other Ambulatory Visit: Payer: Self-pay

## 2021-01-30 ENCOUNTER — Ambulatory Visit: Payer: PPO | Admitting: Anesthesiology

## 2021-01-30 ENCOUNTER — Encounter: Payer: Self-pay | Admitting: Otolaryngology

## 2021-01-30 DIAGNOSIS — D140 Benign neoplasm of middle ear, nasal cavity and accessory sinuses: Secondary | ICD-10-CM | POA: Diagnosis not present

## 2021-01-30 DIAGNOSIS — J3489 Other specified disorders of nose and nasal sinuses: Secondary | ICD-10-CM | POA: Insufficient documentation

## 2021-01-30 DIAGNOSIS — J342 Deviated nasal septum: Secondary | ICD-10-CM | POA: Diagnosis not present

## 2021-01-30 DIAGNOSIS — F17210 Nicotine dependence, cigarettes, uncomplicated: Secondary | ICD-10-CM | POA: Diagnosis not present

## 2021-01-30 DIAGNOSIS — Z79899 Other long term (current) drug therapy: Secondary | ICD-10-CM | POA: Insufficient documentation

## 2021-01-30 HISTORY — PX: ENDOSCOPIC CONCHA BULLOSA RESECTION: SHX6395

## 2021-01-30 HISTORY — DX: Deviated nasal septum: J34.2

## 2021-01-30 HISTORY — DX: Tinnitus, unspecified ear: H93.19

## 2021-01-30 HISTORY — PX: SEPTOPLASTY: SHX2393

## 2021-01-30 HISTORY — DX: Allergic rhinitis, unspecified: J30.9

## 2021-01-30 SURGERY — EXCISION, CONCHA BULLOSA, ENDOSCOPIC
Anesthesia: General | Site: Nose | Laterality: Right

## 2021-01-30 MED ORDER — FENTANYL CITRATE (PF) 100 MCG/2ML IJ SOLN
INTRAMUSCULAR | Status: DC | PRN
  Administered 2021-01-30 (×2): 50 ug via INTRAVENOUS

## 2021-01-30 MED ORDER — OXYMETAZOLINE HCL 0.05 % NA SOLN
NASAL | Status: DC | PRN
  Administered 2021-01-30: 1 via TOPICAL

## 2021-01-30 MED ORDER — SUCCINYLCHOLINE CHLORIDE 200 MG/10ML IV SOSY
PREFILLED_SYRINGE | INTRAVENOUS | Status: DC | PRN
  Administered 2021-01-30: 80 mg via INTRAVENOUS

## 2021-01-30 MED ORDER — GLYCOPYRROLATE 0.2 MG/ML IJ SOLN
INTRAMUSCULAR | Status: DC | PRN
  Administered 2021-01-30: .2 mg via INTRAVENOUS

## 2021-01-30 MED ORDER — DEXAMETHASONE SODIUM PHOSPHATE 4 MG/ML IJ SOLN
INTRAMUSCULAR | Status: DC | PRN
  Administered 2021-01-30: 10 mg via INTRAVENOUS

## 2021-01-30 MED ORDER — PROPOFOL 10 MG/ML IV BOLUS
INTRAVENOUS | Status: DC | PRN
  Administered 2021-01-30: 100 mg via INTRAVENOUS
  Administered 2021-01-30: 50 mg via INTRAVENOUS

## 2021-01-30 MED ORDER — ONDANSETRON HCL 4 MG/2ML IJ SOLN
INTRAMUSCULAR | Status: DC | PRN
  Administered 2021-01-30 (×2): 4 mg via INTRAVENOUS

## 2021-01-30 MED ORDER — MIDAZOLAM HCL 5 MG/5ML IJ SOLN
INTRAMUSCULAR | Status: DC | PRN
  Administered 2021-01-30: 2 mg via INTRAVENOUS

## 2021-01-30 MED ORDER — ACETAMINOPHEN 10 MG/ML IV SOLN
1000.0000 mg | Freq: Once | INTRAVENOUS | Status: AC
  Administered 2021-01-30: 1000 mg via INTRAVENOUS

## 2021-01-30 MED ORDER — LACTATED RINGERS IV SOLN: INTRAVENOUS | Status: DC

## 2021-01-30 MED ORDER — DOXYCYCLINE HYCLATE 100 MG PO CAPS: ORAL_CAPSULE | ORAL | 0 refills | Status: DC

## 2021-01-30 MED ORDER — OXYCODONE HCL 5 MG PO TABS
5.0000 mg | ORAL_TABLET | Freq: Once | ORAL | Status: AC | PRN
  Administered 2021-01-30: 5 mg via ORAL

## 2021-01-30 MED ORDER — OXYCODONE-ACETAMINOPHEN 10-325 MG PO TABS: ORAL_TABLET | ORAL | 0 refills | Status: DC

## 2021-01-30 MED ORDER — EPHEDRINE SULFATE 50 MG/ML IJ SOLN
INTRAMUSCULAR | Status: DC | PRN
  Administered 2021-01-30 (×2): 10 mg via INTRAVENOUS
  Administered 2021-01-30: 5 mg via INTRAVENOUS
  Administered 2021-01-30: 10 mg via INTRAVENOUS
  Administered 2021-01-30: 5 mg via INTRAVENOUS
  Administered 2021-01-30: 10 mg via INTRAVENOUS

## 2021-01-30 MED ORDER — OXYCODONE HCL 5 MG/5ML PO SOLN
5.0000 mg | Freq: Once | ORAL | Status: AC | PRN
Start: 2021-01-30 — End: 2021-01-30

## 2021-01-30 MED ORDER — LIDOCAINE HCL (CARDIAC) PF 100 MG/5ML IV SOSY
PREFILLED_SYRINGE | INTRAVENOUS | Status: DC | PRN
  Administered 2021-01-30: 50 mg via INTRAVENOUS

## 2021-01-30 MED ORDER — LIDOCAINE-EPINEPHRINE (PF) 1 %-1:200000 IJ SOLN
INTRAMUSCULAR | Status: DC | PRN
  Administered 2021-01-30: 7 mL

## 2021-01-30 SURGICAL SUPPLY — 18 items
CANISTER SUCT 1200ML W/VALVE (MISCELLANEOUS) ×3 IMPLANT
COAGULATOR SUCT 8FR VV (MISCELLANEOUS) ×3 IMPLANT
ELECT REM PT RETURN 9FT ADLT (ELECTROSURGICAL) ×3
ELECTRODE REM PT RTRN 9FT ADLT (ELECTROSURGICAL) ×2 IMPLANT
GLOVE SURG ENC MOIS LTX SZ7.5 (GLOVE) ×12 IMPLANT
GOWN STRL REUS W/ TWL LRG LVL3 (GOWN DISPOSABLE) ×6 IMPLANT
GOWN STRL REUS W/TWL LRG LVL3 (GOWN DISPOSABLE) ×9
KIT TURNOVER KIT A (KITS) ×3 IMPLANT
NS IRRIG 500ML POUR BTL (IV SOLUTION) ×3 IMPLANT
PACK ENT CUSTOM (PACKS) ×3 IMPLANT
PACKING NASAL EPIS 4X2.4 XEROG (MISCELLANEOUS) ×3 IMPLANT
PATTIES SURGICAL .5 X3 (DISPOSABLE) ×3 IMPLANT
SPLINT NASAL SEPTAL PRE-CUT (MISCELLANEOUS) ×3 IMPLANT
SUT CHROMIC 4 0 RB 1X27 (SUTURE) ×3 IMPLANT
SUT ETHILON 4-0 (SUTURE) ×3
SUT ETHILON 4-0 FS2 18XMFL BLK (SUTURE) ×2
SUT PLAIN GUT 4-0 (SUTURE) ×3 IMPLANT
SUTURE ETHLN 4-0 FS2 18XMF BLK (SUTURE) ×2 IMPLANT

## 2021-01-30 NOTE — Transfer of Care (Signed)
Immediate Anesthesia Transfer of Care Note  Patient: April Best  Procedure(s) Performed: ENDOSCOPIC CONCHA BULLOSA RESECTION (Right: Nose) SEPTOPLASTY (Nose) RIGHT SPREADER GRAFT FOR REPAIR OF NASAL VALVE STENOSIS (Right: Nose)  Patient Location: PACU  Anesthesia Type: General ETT  Level of Consciousness: awake, alert  and patient cooperative  Airway and Oxygen Therapy: Patient Spontanous Breathing and Patient connected to supplemental oxygen  Post-op Assessment: Post-op Vital signs reviewed, Patient's Cardiovascular Status Stable, Respiratory Function Stable, Patent Airway and No signs of Nausea or vomiting  Post-op Vital Signs: Reviewed and stable  Complications: No notable events documented.

## 2021-01-30 NOTE — Anesthesia Postprocedure Evaluation (Signed)
Anesthesia Post Note  Patient: April Best  Procedure(s) Performed: ENDOSCOPIC CONCHA BULLOSA RESECTION (Right: Nose) SEPTOPLASTY (Nose) RIGHT SPREADER GRAFT FOR REPAIR OF NASAL VALVE STENOSIS (Right: Nose)     Patient location during evaluation: PACU Anesthesia Type: General Level of consciousness: awake and alert Pain management: pain level controlled Vital Signs Assessment: post-procedure vital signs reviewed and stable Respiratory status: spontaneous breathing, nonlabored ventilation, respiratory function stable and patient connected to nasal cannula oxygen Cardiovascular status: blood pressure returned to baseline and stable Postop Assessment: no apparent nausea or vomiting Anesthetic complications: no   No notable events documented.  Fidel Levy

## 2021-01-30 NOTE — Anesthesia Preprocedure Evaluation (Signed)
Anesthesia Evaluation  Patient identified by MRN, date of birth, ID band Patient awake    Reviewed: NPO status   History of Anesthesia Complications Negative for: history of anesthetic complications  Airway Mallampati: II  TM Distance: >3 FB Neck ROM: full    Dental no notable dental hx.    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;Neuropathy; DDD;     GI/Hepatic negative GI ROS, Neg liver ROS,   Endo/Other  negative endocrine ROS  Renal/GU negative Renal ROS  negative genitourinary   Musculoskeletal  (+) Arthritis  ( psoriatic arthritis ),   Abdominal   Peds  Hematology Hemochromatosis   Anesthesia Other Findings  pcp: Cammie Sickle, MD at 01/12/2021  ;  Last percocet: 8/16 at 0600;  Reproductive/Obstetrics                             Anesthesia Physical Anesthesia Plan  ASA: 3  Anesthesia Plan: General ETT   Post-op Pain Management:    Induction:   PONV Risk Score and Plan: 3 and Midazolam, Dexamethasone and Ondansetron  Airway Management Planned:   Additional Equipment:   Intra-op Plan:   Post-operative Plan:   Informed Consent: I have reviewed the patients History and Physical, chart, labs and discussed the procedure including the risks, benefits and alternatives for the proposed anesthesia with the patient or authorized representative who has indicated his/her understanding and acceptance.       Plan Discussed with: CRNA  Anesthesia Plan Comments:         Anesthesia Quick Evaluation

## 2021-01-30 NOTE — Op Note (Signed)
01/30/2021  1:15 PM    April Best  [id number]   Pre-Op Diagnosis:  Nasal Obstruction with right concha bullosa, Deviated Nasal Septum , Right Nasal Valve Stenosis Post-op Diagnosis: Nasal Obstruction with right concha bullosa, Deviated Nasal Septum, Right Nasal Valve Stenosis  Procedure:  1)  Right Endoscopic concha bullosa resection   2)  Revision Nasal Septoplasty   3)  Right spreader graft placement for repair of nasal valve collapse      Surgeon:  Riley Nearing  Anesthesia:  General endotracheal  EBL:  [id number]  Complications:  None  Findings: Right septal deviation, right concha bullosa, narrow internal nasal valve on the right  Procedure: After the patient was identified in holding and the benefits of the procedure were reviewed as well as the consent and risks, the patient was taken to the operating room and with the patient in a comfortable supine position,  general orotracheal anesthesia was induced without difficulty.  A proper time-out was performed.  The [employer] image guidance system was set up and calibrated in the normal fashion and felt to be acceptable.  Next 1% Xylocaine with 1:100,000 epinephrine was infiltrated into the right middle turbinate and the nasal septum. Cottoniod pledgets soaked in Afrin were placed into both nasal cavities and left while the patient was prepped and draped in the standard fashion.   The right nasal cavity was inspected with the endoscope and a sickle knife used to incise the anterior aspect of the widened middle turbinate, opening the concha bullosa. Endoscopic scissors were then used to excise the lateral wall of the concha bullosa. Additional debridement was performed with thru cutting forceps. Suction cautery was used to control bleeding.  Beginning on the left hand side a hemitransfixion incision was then created on the leading edge of the septum on the left.  A subperichondrial plane was elevated posteriorly on the right and taken  back about half way, as the anterior aspect of the septum was the only deviated portion. There was central cartilage deficiency, but enough caudal cartilage remained to harvest a spreader graft and still maintain tip support. There was some inferior septal redundancy that was conservatively trimmed, at which point the septum relaxed into a midline position. A small pocket was created dorsally for the spreader graft, which was carefully placed, lateralizing the upper lateral cartilage and opening the nasal valve. A 4-0 plain gut was passed through the septum in a quilting stitch just below the graft to further secure it. The septum was then replaced in the midline. Reinspection through each nostril showed excellent reduction of the septal deformity. The septal incision was closed with 4-0 chromic gut suture. The 4-0 plain gut suture was used to further reapproximate the septal flaps to the underlying cartilage, utilizing a running, quilting type stitch.   Xerogel was placed in the right middle meatus to control minor residual oozing. Nasal septal splints were placed within each nostril and affixed to the septum using a 3-0 nylon suture.   The nose was suctioned and inspected, and bleeding noted to be well controlled.   The patient was then returned to the anesthesiologist for awakening and taken to recovery room in good condition postoperatively.  Disposition:   PACU and d/c home  Plan: Ice, elevation, narcotic analgesia and prophylactic antibiotics. Begin nasal irrigations with saline tomrrow, irrigating 3-4 times daily. Return to the office in 7 days.  Return to work in 7-10 days, no strenuous activities for two weeks.   April Best  April Best 01/30/2021 1:15 PM

## 2021-01-30 NOTE — H&P (Signed)
History and physical reviewed and will be scanned in later. No change in medical status reported by the patient or family, appears stable for surgery. All questions regarding the procedure answered, and patient (or family if a child) expressed understanding of the procedure. ? ?April Best S [name] ?@[email or web address]@ ?

## 2021-01-30 NOTE — Anesthesia Procedure Notes (Signed)
Procedure Name: Intubation Date/Time: 01/30/2021 12:51 PM Performed by: Mayme Genta, CRNA Pre-anesthesia Checklist: Patient identified, Patient being monitored, Timeout performed, Emergency Drugs available and Suction available Patient Re-evaluated:Patient Re-evaluated prior to induction Oxygen Delivery Method: Circle System Utilized Preoxygenation: Pre-oxygenation with 100% oxygen Induction Type: IV induction Ventilation: Mask ventilation without difficulty Laryngoscope Size: Mac and 3 Grade View: Grade II Tube type: Oral Rae Tube size: 7.0 mm Number of attempts: 1 Airway Equipment and Method: Stylet Placement Confirmation: ETT inserted through vocal cords under direct vision, positive ETCO2 and breath sounds checked- equal and bilateral Secured at: 21 cm Tube secured with: Tape Dental Injury: Teeth and Oropharynx as per pre-operative assessment

## 2021-01-31 ENCOUNTER — Encounter: Payer: Self-pay | Admitting: Otolaryngology

## 2021-02-05 ENCOUNTER — Ambulatory Visit (INDEPENDENT_AMBULATORY_CARE_PROVIDER_SITE_OTHER): Payer: PPO | Admitting: Family Medicine

## 2021-02-05 ENCOUNTER — Other Ambulatory Visit: Payer: Self-pay

## 2021-02-05 ENCOUNTER — Encounter: Payer: Self-pay | Admitting: Family Medicine

## 2021-02-05 VITALS — BP 109/72 | HR 87 | Temp 98.4°F

## 2021-02-05 DIAGNOSIS — I700 Atherosclerosis of aorta: Secondary | ICD-10-CM | POA: Diagnosis not present

## 2021-02-05 DIAGNOSIS — J439 Emphysema, unspecified: Secondary | ICD-10-CM | POA: Diagnosis not present

## 2021-02-05 DIAGNOSIS — R7303 Prediabetes: Secondary | ICD-10-CM | POA: Diagnosis not present

## 2021-02-05 DIAGNOSIS — I10 Essential (primary) hypertension: Secondary | ICD-10-CM | POA: Diagnosis not present

## 2021-02-05 DIAGNOSIS — E7800 Pure hypercholesterolemia, unspecified: Secondary | ICD-10-CM | POA: Diagnosis not present

## 2021-02-05 DIAGNOSIS — Z1231 Encounter for screening mammogram for malignant neoplasm of breast: Secondary | ICD-10-CM

## 2021-02-05 DIAGNOSIS — R809 Proteinuria, unspecified: Secondary | ICD-10-CM

## 2021-02-05 DIAGNOSIS — L4050 Arthropathic psoriasis, unspecified: Secondary | ICD-10-CM

## 2021-02-05 DIAGNOSIS — M810 Age-related osteoporosis without current pathological fracture: Secondary | ICD-10-CM

## 2021-02-05 DIAGNOSIS — J3489 Other specified disorders of nose and nasal sinuses: Secondary | ICD-10-CM | POA: Diagnosis not present

## 2021-02-05 DIAGNOSIS — Z0000 Encounter for general adult medical examination without abnormal findings: Secondary | ICD-10-CM

## 2021-02-05 NOTE — Assessment & Plan Note (Signed)
F/b Heme Reviewed recent labs

## 2021-02-05 NOTE — Assessment & Plan Note (Signed)
Reviewed last lipid panel Not currently on a statin - likely needs one Recheck FLP and CMP Discussed diet and exercise

## 2021-02-05 NOTE — Assessment & Plan Note (Signed)
F/b Nephro s/p extensive workup - discussed with patient Continue Losartan

## 2021-02-05 NOTE — Patient Instructions (Addendum)
   The CDC recommends two doses of Shingrix (the shingles vaccine) separated by 2 to 6 months for adults age 69 years and older. I recommend checking with your insurance plan regarding coverage for this vaccine.   

## 2021-02-05 NOTE — Assessment & Plan Note (Signed)
F/b Endocrinology Continue Reclast Repeat DEXA next year

## 2021-02-05 NOTE — Progress Notes (Signed)
Complete physical exam   Patient: April Best   DOB: September 26, 1950   70 y.o. Female  MRN: [id number] Visit Date: 02/05/2021  Today's healthcare provider: Lavon Paganini, MD   Chief Complaint  Patient presents with   Complete Physical Exam     Subjective    April Best is a 70 y.o. female who presents today for a complete physical exam.  She reports consuming a general diet. The patient does not participate in regular exercise at present. She generally feels well. She reports sleeping well. She does not have additional problems to discuss today.   HPI   Knee/Back Pain She currently goes to [facility] for knee and back pain. She received an, X-ray of knee and back showed no deterioration due to her osteoarthritis. She had an MRI schedule for th 08/30 for her spine. However she is constantly in pain.  Pain management  Her pain is predominant on left side that began 09/19 shortly after a tooth extraction. Following she had trouble standing on he left side and the pain has been constant since and progressively worse. She is followed by Allyson Sabal for pain management. She takes percocet 3 x day however it doesn't stop the pain.   Psoriatic Arthritis  She has a Family Hx of multiple myeloma and psoriasis. She is concerned that all her symptoms could be associated with multiple myeloma versus psoriatic arthritis because there was protein detected in her urine. She is taking losartan to control the protein. Further, She has tried enbrel but she had an allergic reaction causing LE swelling. She now takes humira, however the shots make her extremely fatigue.   She is followed by rheumatology, endocrinology, oncology, urology and nephrology.   Sinus surgery  Sinus surgery last Tuesday and her stents were removed today. She is having some pain secondary to the pressure from her mask.   Screenings  Mammogram 08/03/2019 Bone Density- She is scheduled for next year   Colonoscopy 11/19/2017  Vaccines She is current on pneumonia and has 2 COVID vaccines. She hasn't received shingrix and was notified to check with insurance for coverage.    Past Medical History:  Diagnosis Date   Allergic rhinitis    Allergy    Annular oral lichen planus    Anxiety    Arthritis    Depression    Deviated nasal septum    nasal obstruction   Guaiac positive stools    history   H. 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   History of palpitations    Hyperlipidemia    Hypertension    Migraine    Osteoarthritis    Osteoporosis    Panic disorder    Psoriasis    Psoriatic arthritis ([place])    Rheumatoid arteritis ([place])    Tinnitus    Past Surgical History:  Procedure Laterality Date   ABDOMINAL HYSTERECTOMY  1983   Ovaries have been removed.   ABDOMINOPLASTY     APPENDECTOMY     BREAST BIOPSY Right    neg- core   COLONOSCOPY  03/2006   COLONOSCOPY WITH PROPOFOL N/A 11/19/2017   Procedure: COLONOSCOPY WITH PROPOFOL;  Surgeon: Toledo, Benay Pike, MD;  Location: [facility];  Service: Gastroenterology;  Laterality: N/A;   ENDOSCOPIC CONCHA BULLOSA RESECTION Right 01/30/2021   Procedure: ENDOSCOPIC CONCHA BULLOSA RESECTION;  Surgeon: Clyde Canterbury, MD;  Location: [place];  Service: ENT;  Laterality: Right;  RHINOPLASTY  2002   SEPTOPLASTY N/A 01/30/2021   Procedure: SEPTOPLASTY;  Surgeon: Geanie Logan, MD;  Location: [facility];  Service: ENT;  Laterality: N/A;   UPPER GI ENDOSCOPY  2008   Social History   Socioeconomic History   Marital status: Married    Spouse name: Not on file   Number of children: 1   Years of education: Not on file   Highest education level: High school graduate  Occupational History   Occupation: retired  Tobacco Use   Smoking status: Every Day    Packs/day: 1.00    Years: 49.00    Pack years: 49.00    Types: Cigarettes   Smokeless tobacco: Never   Tobacco  comments:    pt declines smoking cessation ( smoked since age 11)  Vaping Use   Vaping Use: Never used  Substance and Sexual Activity   Alcohol use: Not Currently    Alcohol/week: 0.0 standard drinks   Drug use: No   Sexual activity: Yes    Birth control/protection: Surgical  Other Topics Concern   Not on file  Social History Narrative   Not on file   Social Determinants of Health   Financial Resource Strain: Low Risk    Difficulty of Paying Living Expenses: Not hard at all  Food Insecurity: No Food Insecurity   Worried About Programme researcher, broadcasting/film/video in the Last Year: Never true   Ran Out of Food in the Last Year: Never true  Transportation Needs: No Transportation Needs   Lack of Transportation (Medical): No   Lack of Transportation (Non-Medical): No  Physical Activity: Inactive   Days of Exercise per Week: 0 days   Minutes of Exercise per Session: 0 min  Stress: Stress Concern Present   Feeling of Stress : To some extent  Social Connections: Moderately Integrated   Frequency of Communication with Friends and Family: More than three times a week   Frequency of Social Gatherings with Friends and Family: More than three times a week   Attends Religious Services: Never   Database administrator or Organizations: Yes   Attends Engineer, structural: More than 4 times per year   Marital Status: Married  Catering manager Violence: Not At Risk   Fear of Current or Ex-Partner: No   Emotionally Abused: No   Physically Abused: No   Sexually Abused: No   Family Status  Relation Name Status   Mother  Alive   Father  Deceased   Sister  Alive   Sister  (Not Specified)   Brother  Alive   Neg Hx  (Not Specified)   Family History  Problem Relation Age of Onset   Hypertension Mother    Hyperlipidemia Mother    Hypothyroidism Mother    Fibromyalgia Mother    Hearing loss Mother    Lung cancer Father    Coronary artery disease Father    Hyperlipidemia Father     Hypertension Father    Heart attack Father    Hearing loss Sister    Hyperlipidemia Sister    Hypertension Sister    Fibromyalgia Sister    Hemochromatosis Sister    ADD / ADHD Brother    Breast cancer Neg Hx    Allergies  Allergen Reactions   Enbrel [Etanercept] Hives    Other reaction(s): Muscle Cramps   Augmentin  [Amoxicillin-Pot Clavulanate] Nausea And Vomiting    as stated (XR).   Cefdinir Diarrhea and Other (See Comments)  Other reaction(s): GI intolerance, Other (see comments) Gives her Cdiff Gives her Cdiff    Lisinopril Cough    Patient Care Team: Virginia Crews, MD as PCP - General (Family Medicine) Cammie Sickle, MD as Consulting Physician (Internal Medicine) Lonia Farber, MD as Consulting Physician (Internal Medicine) Allyson Sabal, NP as Nurse Practitioner (Pain Medicine) Pa, [place] (Optometry) Quintin Alto, MD as Consulting Physician (Rheumatology) Vladimir Crofts, MD as Consulting Physician (Neurology) [name], MD (Urology) Thornton Park, MD as Referring Physician (Orthopedic Surgery) Pilar Jarvis, MD as Referring Physician (Pain Medicine) [place], Jimmy J, MD as Referring Physician (Physical Medicine and Rehabilitation) Clyde Canterbury, MD as Referring Physician (Otolaryngology)   Medications: Outpatient Medications Prior to Visit  Medication Sig   amLODipine (NORVASC) 10 MG tablet Take 10 mg by mouth daily. (Patient not taking: Reported on 01/25/2021)   azelastine (ASTELIN) 0.1 % nasal spray Place 1 spray into both nostrils daily as needed.   Cholecalciferol (VITAMIN D3 PO) Take 1,000 Int'l Units/day by mouth.   clobetasol ointment (TEMOVATE) 0.05 % APP TOPICALLY TO GLUTEAL AREA ONCE D PRF DERMATITIS   COLLAGEN PO Take 30 mLs by mouth.   diclofenac Sodium (VOLTAREN) 1 % GEL Apply 2 g topically 4 (four) times daily.   doxycycline (VIBRAMYCIN) 100 MG capsule 100 mg PO BID x 10 days   DULoxetine  (CYMBALTA) 20 MG capsule Take 20 mg by mouth daily.   escitalopram (LEXAPRO) 20 MG tablet TAKE 1 TABLET BY MOUTH DAILY   fluticasone (FLONASE) 50 MCG/ACT nasal spray SHAKE LIQUID AND USE 2 SPRAYS IN EACH NOSTRIL EVERY DAY (Patient taking differently: as needed.)   gabapentin (NEURONTIN) 100 MG capsule Take 100 mg by mouth 2 (two) times daily as needed.   gentamicin ointment (GARAMYCIN) 0.1 % Apply 1 application topically 3 (three) times daily.   HUMIRA PEN 40 MG/0.4ML PNKT every 14 (fourteen) days. Every other week   hydrochlorothiazide (HYDRODIURIL) 25 MG tablet TAKE 1 TABLET BY MOUTH DAILY   loratadine (CLARITIN) 10 MG tablet Take 10 mg by mouth daily.   losartan (COZAAR) 25 MG tablet Take 25 mg by mouth daily.   meloxicam (MOBIC) 15 MG tablet Take 15 mg by mouth daily. (Patient not taking: Reported on 01/25/2021)   methocarbamol (ROBAXIN) 500 MG tablet Take 500 mg by mouth every 8 (eight) hours as needed.   ondansetron (ZOFRAN) 4 MG tablet TAKE ONE TABLET BY MOUTH EVERY EIGHT HOURS AS NEEDED FOR NAUSEA / VOMITING   oxyCODONE-acetaminophen (PERCOCET) 10-325 MG tablet Take 1 tablet by mouth every 4 (four) hours as needed for pain.   oxyCODONE-acetaminophen (PERCOCET) 10-325 MG tablet One PO every 4-6 hours as needed for pain   rosuvastatin (CRESTOR) 5 MG tablet TAKE 1 TABLET(5 MG) BY MOUTH DAILY (Patient not taking: No sig reported)   traZODone (DESYREL) 100 MG tablet Take 1 tablet (100 mg total) by mouth at bedtime.   No facility-administered medications prior to visit.    Review of Systems  Constitutional:  Positive for fatigue. Negative for chills, diaphoresis and fever.  HENT:  Negative for ear pain, sinus pressure, sinus pain and sore throat.   Eyes:  Negative for pain and visual disturbance.  Respiratory:  Negative for cough, chest tightness, shortness of breath and wheezing.   Cardiovascular:  Negative for chest pain, palpitations and leg swelling.  Gastrointestinal:  Positive for  constipation. Negative for abdominal pain, blood in stool, diarrhea, nausea and vomiting.  Genitourinary:  Negative  for dysuria, flank pain, frequency, pelvic pain and urgency.  Musculoskeletal:  Positive for arthralgias, back pain, myalgias, neck pain and neck stiffness.  Neurological:  Negative for dizziness, seizures, syncope, weakness, light-headedness, numbness and headaches.  Hematological:  Bruises/bleeds easily.  All other systems reviewed and are negative.  Last CBC Lab Results  Component Value Date   WBC 10.4 01/05/2021   HGB 15.4 (H) 01/05/2021   HCT 47.5 (H) 01/05/2021   MCV 100.6 (H) 01/05/2021   MCH 32.6 01/05/2021   RDW 12.8 01/05/2021   PLT 284 93/90/3009   Last metabolic panel Lab Results  Component Value Date   GLUCOSE 113 (H) 01/20/2020   NA 142 01/20/2020   K 3.6 01/20/2020   CL 101 01/20/2020   CO2 25 01/20/2020   BUN 16 01/20/2020   CREATININE 0.84 01/20/2020   GFRNONAA 72 01/20/2020   GFRAA 83 01/20/2020   CALCIUM 9.8 01/20/2020   PROT 6.6 01/20/2020   ALBUMIN 4.5 01/20/2020   LABGLOB 2.1 01/20/2020   AGRATIO 2.1 01/20/2020   BILITOT 0.3 01/20/2020   ALKPHOS 57 01/20/2020   AST 18 01/20/2020   ALT 19 01/20/2020   ANIONGAP 11 01/01/2019   Last lipids Lab Results  Component Value Date   CHOL 217 (H) 09/09/2019   HDL 53 09/09/2019   LDLCALC 144 (H) 09/09/2019   TRIG 110 09/09/2019   CHOLHDL 4.1 09/09/2019   Last hemoglobin A1c Lab Results  Component Value Date   HGBA1C 5.8 (H) 02/11/2020   Last thyroid functions Lab Results  Component Value Date   TSH 1.130 09/09/2019   Last vitamin D Lab Results  Component Value Date   VD25OH 31.5 09/09/2019      Objective    There were no vitals taken for this visit. BP Readings from Last 3 Encounters:  01/30/21 (!) 134/57  01/12/21 (P) 125/72  12/26/20 131/81    Wt Readings from Last 3 Encounters:  01/30/21 115 lb (52.2 kg)  01/12/21 115 lb (52.2 kg)  12/26/20 115 lb (52.2 kg)       Physical Exam Vitals reviewed.  Constitutional:      General: She is not in acute distress.    Appearance: Normal appearance. She is well-developed. She is not diaphoretic.  HENT:     Head: Normocephalic and atraumatic.     Right Ear: Tympanic membrane, ear canal and external ear normal.     Left Ear: Tympanic membrane, ear canal and external ear normal.     Nose: Nose normal.     Mouth/Throat:     Mouth: Mucous membranes are moist.     Pharynx: Oropharynx is clear. No oropharyngeal exudate.  Eyes:     General: No scleral icterus.    Conjunctiva/sclera: Conjunctivae normal.     Pupils: Pupils are equal, round, and reactive to light.  Neck:     Thyroid: No thyromegaly.  Cardiovascular:     Rate and Rhythm: Normal rate and regular rhythm.     Pulses: Normal pulses.     Heart sounds: Normal heart sounds. No murmur heard. Pulmonary:     Effort: Pulmonary effort is normal. No respiratory distress.     Breath sounds: Normal breath sounds. No wheezing or rales.  Abdominal:     General: There is no distension.     Palpations: Abdomen is soft.     Tenderness: There is no abdominal tenderness.  Musculoskeletal:        General: No deformity.     Cervical  back: Neck supple.     Right lower leg: No edema.     Left lower leg: No edema.  Lymphadenopathy:     Cervical: No cervical adenopathy.  Skin:    General: Skin is warm and dry.     Findings: No rash.  Neurological:     Mental Status: She is alert and oriented to person, place, and time. Mental status is at baseline.     Sensory: No sensory deficit.     Motor: No weakness.     Gait: Gait normal.  Psychiatric:        Mood and Affect: Mood normal.        Behavior: Behavior normal.        Thought Content: Thought content normal.      Last depression screening scores PHQ 2/9 Scores 10/11/2020 09/18/2020 09/14/2019  PHQ - 2 Score 0 3 2  PHQ- 9 Score 0 8 4   Last fall risk screening Fall Risk  10/11/2020  Falls in the past  year? 0  Number falls in past yr: 0  Injury with Fall? 0  Risk for fall due to : No Fall Risks  Follow up Falls evaluation completed   Last Audit-C alcohol use screening Alcohol Use Disorder Test (AUDIT) 10/11/2020  1. How often do you have a drink containing alcohol? 0  2. How many drinks containing alcohol do you have on a typical day when you are drinking? 0  3. How often do you have six or more drinks on one occasion? 0  AUDIT-C Score 0  Alcohol Brief Interventions/Follow-up -   A score of 3 or more in women, and 4 or more in men indicates increased risk for alcohol abuse, EXCEPT if all of the points are from question 1   No results found for any visits on 02/05/21.  Assessment & Plan       Routine Health Maintenance and Physical Exam  Exercise Activities and Dietary recommendations  Goals      Exercise 150 minutes per week (moderate activity)     Recommend to exercise for 3 days a week for at least 30 minutes at a time.      Quit smoking / using tobacco        Immunization History  Administered Date(s) Administered   Influenza Split 02/21/2010, 04/22/2012, 07/15/2016   Influenza, High Dose Seasonal PF 03/05/2017, 07/08/2018   Influenza,inj,Quad PF,6+ Mos 03/23/2015   Moderna Sars-Covid-2 Vaccination 07/23/2019, 08/20/2019   Pneumococcal Conjugate-13 01/15/2017   Pneumococcal Polysaccharide-23 04/22/2012, 07/08/2018   Tdap 02/21/2010   Zoster, Live 11/04/2013    Health Maintenance  Topic Date Due   Zoster Vaccines- Shingrix (1 of 2) Never done   COVID-19 Vaccine (3 - Moderna risk series) 09/17/2019   INFLUENZA VACCINE  01/15/2021   TETANUS/TDAP  09/18/2021 (Originally 02/22/2020)   MAMMOGRAM  08/02/2021   DEXA SCAN  09/05/2024   COLONOSCOPY (Pts 45-20yrs Insurance coverage will need to be confirmed)  11/20/2027   Hepatitis C Screening  Completed   PNA vac Low Risk Adult  Completed   HPV VACCINES  Aged Out    Discussed health benefits of physical activity,  and encouraged her to engage in regular exercise appropriate for her age and condition.   Problem List Items Addressed This Visit       Cardiovascular and Mediastinum   Benign hypertension    Well controlled Continue current medications Recheck metabolic panel F/u in 6 months  Relevant Orders   Comprehensive metabolic panel     Respiratory   Emphysema of lung (HCC)    Chronic and stable Encourage smoking cessation        Musculoskeletal and Integument   Age-related osteoporosis without current pathological fracture    F/b Endocrinology Continue Reclast Repeat DEXA next year      Psoriatic arthritis ([place])    F/b Rheum Discussed her lab results previously Failed Enbrel Continue Humira        Other   Hypercholesteremia    Reviewed last lipid panel Not currently on a statin - likely needs one Recheck FLP and CMP Discussed diet and exercise       Relevant Orders   Comprehensive metabolic panel   Lipid panel   [name] ([place])    F/b Heme Reviewed recent labs      Elevated glucose    Prediabetes Recommend low carb diet Recheck A1c       Proteinuria    F/b Nephro s/p extensive workup - discussed with patient Continue Losartan      Other Visit Diagnoses     Encounter for annual physical exam    -  Primary   Relevant Orders   Hemoglobin A1c   Comprehensive metabolic panel   Lipid panel   Aortic atherosclerosis ([place])       Relevant Orders   Lipid panel   Encounter for screening mammogram for malignant neoplasm of breast       Relevant Orders   MM 3D SCREEN BREAST BILATERAL          Return in about 6 months (around 08/08/2021) for chronic disease f/u.     I,Essence Turner,acting as a Education administrator for Lavon Paganini, MD.,have documented all relevant documentation on the behalf of Lavon Paganini, MD,as directed by  Lavon Paganini, MD while in the presence of Lavon Paganini, MD.   I, Lavon Paganini, MD, have  reviewed all documentation for this visit. The documentation on 02/05/21 for the exam, diagnosis, procedures, and orders are all accurate and complete.   Inocente Krach, Dionne Bucy, MD, MPH [facility]

## 2021-02-05 NOTE — Assessment & Plan Note (Signed)
Well controlled Continue current medications Recheck metabolic panel F/u in 6 months  

## 2021-02-05 NOTE — Assessment & Plan Note (Signed)
Prediabetes Recommend low carb diet Recheck A1c

## 2021-02-05 NOTE — Assessment & Plan Note (Signed)
Chronic and stable Encourage smoking cessation

## 2021-02-05 NOTE — Assessment & Plan Note (Signed)
F/b Rheum Discussed her lab results previously Failed Enbrel Continue Humira

## 2021-02-06 LAB — COMPREHENSIVE METABOLIC PANEL
ALT: 15 IU/L (ref 0–32)
AST: 12 IU/L (ref 0–40)
Albumin/Globulin Ratio: 2.2 (ref 1.2–2.2)
Albumin: 4.4 g/dL (ref 3.8–4.8)
Alkaline Phosphatase: 57 IU/L (ref 44–121)
BUN/Creatinine Ratio: 26 (ref 12–28)
BUN: 22 mg/dL (ref 8–27)
Bilirubin Total: 0.3 mg/dL (ref 0.0–1.2)
CO2: 27 mmol/L (ref 20–29)
Calcium: 10.1 mg/dL (ref 8.7–10.3)
Chloride: 102 mmol/L (ref 96–106)
Creatinine, Ser: 0.84 mg/dL (ref 0.57–1.00)
Globulin, Total: 2 g/dL (ref 1.5–4.5)
Glucose: 85 mg/dL (ref 65–99)
Potassium: 4.1 mmol/L (ref 3.5–5.2)
Sodium: 143 mmol/L (ref 134–144)
Total Protein: 6.4 g/dL (ref 6.0–8.5)
eGFR: 75 mL/min/{1.73_m2} (ref >59)

## 2021-02-06 LAB — LIPID PANEL
Chol/HDL Ratio: 5.7 ratio — ABNORMAL HIGH (ref 0.0–4.4)
Cholesterol, Total: 251 mg/dL — ABNORMAL HIGH (ref 100–199)
HDL: 44 mg/dL (ref >39)
LDL Chol Calc (NIH): 156 mg/dL — ABNORMAL HIGH (ref 0–99)
Triglycerides: 273 mg/dL — ABNORMAL HIGH (ref 0–149)
VLDL Cholesterol Cal: 51 mg/dL — ABNORMAL HIGH (ref 5–40)

## 2021-02-06 LAB — HEMOGLOBIN A1C
Est. average glucose Bld gHb Est-mCnc: 114 mg/dL
Hgb A1c MFr Bld: 5.6 % (ref 4.8–5.6)

## 2021-02-13 DIAGNOSIS — M5416 Radiculopathy, lumbar region: Secondary | ICD-10-CM | POA: Diagnosis not present

## 2021-02-14 DIAGNOSIS — M5416 Radiculopathy, lumbar region: Secondary | ICD-10-CM | POA: Diagnosis not present

## 2021-02-14 DIAGNOSIS — M7061 Trochanteric bursitis, right hip: Secondary | ICD-10-CM | POA: Diagnosis not present

## 2021-02-23 DIAGNOSIS — X32XXXA Exposure to sunlight, initial encounter: Secondary | ICD-10-CM | POA: Diagnosis not present

## 2021-02-23 DIAGNOSIS — L570 Actinic keratosis: Secondary | ICD-10-CM | POA: Diagnosis not present

## 2021-02-23 DIAGNOSIS — L4059 Other psoriatic arthropathy: Secondary | ICD-10-CM | POA: Diagnosis not present

## 2021-02-23 DIAGNOSIS — L738 Other specified follicular disorders: Secondary | ICD-10-CM | POA: Diagnosis not present

## 2021-02-23 DIAGNOSIS — L720 Epidermal cyst: Secondary | ICD-10-CM | POA: Diagnosis not present

## 2021-02-23 DIAGNOSIS — L400 Psoriasis vulgaris: Secondary | ICD-10-CM | POA: Diagnosis not present

## 2021-03-21 ENCOUNTER — Ambulatory Visit: Payer: Self-pay

## 2021-03-21 DIAGNOSIS — G894 Chronic pain syndrome: Secondary | ICD-10-CM | POA: Diagnosis not present

## 2021-03-21 DIAGNOSIS — M25559 Pain in unspecified hip: Secondary | ICD-10-CM | POA: Diagnosis not present

## 2021-03-21 DIAGNOSIS — M810 Age-related osteoporosis without current pathological fracture: Secondary | ICD-10-CM | POA: Diagnosis not present

## 2021-03-21 DIAGNOSIS — N281 Cyst of kidney, acquired: Secondary | ICD-10-CM | POA: Diagnosis not present

## 2021-03-21 DIAGNOSIS — M5459 Other low back pain: Secondary | ICD-10-CM | POA: Diagnosis not present

## 2021-03-21 DIAGNOSIS — R110 Nausea: Secondary | ICD-10-CM | POA: Diagnosis not present

## 2021-03-21 DIAGNOSIS — M8438XA Stress fracture, other site, initial encounter for fracture: Secondary | ICD-10-CM | POA: Diagnosis not present

## 2021-03-21 DIAGNOSIS — Z79899 Other long term (current) drug therapy: Secondary | ICD-10-CM | POA: Diagnosis not present

## 2021-03-21 DIAGNOSIS — M5412 Radiculopathy, cervical region: Secondary | ICD-10-CM | POA: Diagnosis not present

## 2021-03-21 DIAGNOSIS — M5416 Radiculopathy, lumbar region: Secondary | ICD-10-CM | POA: Diagnosis not present

## 2021-03-21 DIAGNOSIS — M5481 Occipital neuralgia: Secondary | ICD-10-CM | POA: Diagnosis not present

## 2021-03-21 NOTE — Telephone Encounter (Signed)
The patient has called to share that they've experienced urinary discomfort for multiple days   The patient is currently experiencing frequent urination and lower back pain   Please contact further when possible  Pt. Reports urology treated her for UTI "a couple of weeks ago, and I couldn't finish the sulfa medicine." Symptoms are now back.No availability in the practice this week. Pt. Will call her urologist.     Reason for Disposition  [1] Can't control passage of urine (i.e., urinary incontinence) AND [2] new-onset (< 2 weeks) or worsening  Answer Assessment - Initial Assessment Questions 1. SYMPTOM: "What's the main symptom you're concerned about?" (e.g., frequency, incontinence)     Incontinence, frequency 2. ONSET: "When did the  symptoms start?"     1-2 weeks ago 3. PAIN: "Is there any pain?" If Yes, ask: "How bad is it?" (Scale: 1-10; mild, moderate, severe)     Moderate 4. CAUSE: "What do you think is causing the symptoms?"     UTI 5. OTHER SYMPTOMS: "Do you have any other symptoms?" (e.g., fever, flank pain, blood in urine, pain with urination)     No 6. PREGNANCY: "Is there any chance you are pregnant?" "When was your last menstrual period?"     No  Protocols used: Urinary Symptoms-A-AH

## 2021-03-21 NOTE — Progress Notes (Signed)
03/22/2021 2:28 PM   April Best 06/02/1951 [id number]  Referring provider: Virginia Crews, MD [address] [place],  [place] [address]  Chief Complaint  Patient presents with   Urinary Frequency     Urological history: 1. Left renal cyst -MRI 2021 2.4 cm simple renal cyst  2. rUTI's -contributing factors of age and vaginal atrophy -documented positive urine cultures over the last year  Pan-sensitive E.coli 12/26/2020    Chief Complaint  Patient presents with   Urinary Frequency   HPI: April Best is a 70 y.o. female who presents today for frequency, pain in the lower pain, leakage and pain with urination.   She complains of a 3 to 4 day history of frequency, urgency, dysuria, urge incontinence and lower back pain.  Patient denies any modifying or aggravating factors.  Patient denies any gross hematuria or suprapubic/flank pain.  Patient denies any fevers, chills, nausea or vomiting.    UA 6-10 WBC's    PMH: Past Medical History:  Diagnosis Date   Allergic rhinitis    Allergy    Annular oral lichen planus    Anxiety    Arthritis    Depression    Deviated nasal septum    nasal obstruction   Guaiac positive stools    history   H. 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   ABDOMINOPLASTY     APPENDECTOMY     BREAST BIOPSY Right    neg- core   COLONOSCOPY  03/2006   COLONOSCOPY WITH PROPOFOL N/A 11/19/2017   Procedure: COLONOSCOPY WITH PROPOFOL;  Surgeon: Toledo, Benay Pike, MD;  Location: [facility];  Service: Gastroenterology;  Laterality:  N/A;   ENDOSCOPIC CONCHA BULLOSA RESECTION Right 01/30/2021   Procedure: ENDOSCOPIC CONCHA BULLOSA RESECTION;  Surgeon: Clyde Canterbury, MD;  Location: [place];  Service: ENT;  Laterality: Right;   RHINOPLASTY  2002   SEPTOPLASTY N/A 01/30/2021   Procedure: SEPTOPLASTY;  Surgeon: Clyde Canterbury, MD;  Location: [place];  Service: ENT;  Laterality: N/A;   UPPER GI ENDOSCOPY  2008    Home Medications:  Allergies as of 03/22/2021       Reactions   Enbrel [etanercept] Hives   Other reaction(s): Muscle Cramps   Augmentin  [amoxicillin-pot Clavulanate] Nausea And Vomiting   as stated (XR).   Cefdinir Diarrhea, Other (See Comments)   Other reaction(s): GI intolerance, Other (see comments) Gives her Cdiff Gives her Cdiff   Lisinopril Cough        Medication List        Accurate as of March 22, 2021 11:59 PM. If you have any questions, ask your nurse or doctor.          STOP taking these medications    doxycycline 100 MG capsule Commonly known as: VIBRAMYCIN Stopped by: Zara Council, PA-C       TAKE these medications    azelastine 0.1 % nasal spray Commonly known as: ASTELIN Place 1 spray into both nostrils  daily as needed.   clobetasol ointment 0.05 % Commonly known as: TEMOVATE APP TOPICALLY TO GLUTEAL AREA ONCE D PRF DERMATITIS   COLLAGEN PO Take 30 mLs by mouth.   diclofenac Sodium 1 % Gel Commonly known as: VOLTAREN Apply 2 g topically 4 (four) times daily.   DULoxetine 20 MG capsule Commonly known as: CYMBALTA Take 20 mg by mouth daily.   escitalopram 20 MG tablet Commonly known as: LEXAPRO TAKE 1 TABLET BY MOUTH DAILY   fluticasone 50 MCG/ACT nasal spray Commonly known as: FLONASE SHAKE LIQUID AND USE 2 SPRAYS IN EACH NOSTRIL EVERY DAY What changed: See the new instructions.   gabapentin 100 MG capsule Commonly known as: NEURONTIN Take 100 mg by mouth 2 (two) times daily as needed.   gentamicin ointment 0.1 % Commonly  known as: GARAMYCIN Apply 1 application topically 3 (three) times daily.   Humira Pen 40 MG/0.4ML Pnkt Generic drug: Adalimumab every 14 (fourteen) days. Every other week   hydrochlorothiazide 25 MG tablet Commonly known as: HYDRODIURIL TAKE 1 TABLET BY MOUTH DAILY   loratadine 10 MG tablet Commonly known as: CLARITIN Take 10 mg by mouth daily.   losartan 25 MG tablet Commonly known as: COZAAR Take 25 mg by mouth daily.   meloxicam 15 MG tablet Commonly known as: MOBIC Take 15 mg by mouth daily.   methocarbamol 500 MG tablet Commonly known as: ROBAXIN Take 500 mg by mouth every 8 (eight) hours as needed.   nitrofurantoin (macrocrystal-monohydrate) 100 MG capsule Commonly known as: MACROBID Take 1 capsule (100 mg total) by mouth every 12 (twelve) hours. Started by: Zara Council, PA-C   ondansetron 4 MG tablet Commonly known as: ZOFRAN TAKE ONE TABLET BY MOUTH EVERY EIGHT HOURS AS NEEDED FOR NAUSEA / VOMITING   oxyCODONE-acetaminophen 10-325 MG tablet Commonly known as: PERCOCET Take 1 tablet by mouth every 4 (four) hours as needed for pain.   oxyCODONE-acetaminophen 10-325 MG tablet Commonly known as: Percocet One PO every 4-6 hours as needed for pain   rosuvastatin 5 MG tablet Commonly known as: CRESTOR TAKE 1 TABLET(5 MG) BY MOUTH DAILY   traZODone 100 MG tablet Commonly known as: DESYREL Take 1 tablet (100 mg total) by mouth at bedtime.   VITAMIN D3 PO Take 1,000 Int'l Units/day by mouth.        Allergies:  Allergies  Allergen Reactions   Enbrel [Etanercept] Hives    Other reaction(s): Muscle Cramps   Augmentin  [Amoxicillin-Pot Clavulanate] Nausea And Vomiting    as stated (XR).   Cefdinir Diarrhea and Other (See Comments)    Other reaction(s): GI intolerance, Other (see comments) Gives her Cdiff Gives her Cdiff    Lisinopril Cough   Macrobid [Nitrofurantoin] Hives    Family History: Family History  Problem Relation Age of Onset    Hypertension Mother    Hyperlipidemia Mother    Hypothyroidism Mother    Fibromyalgia Mother    Hearing loss Mother    Lung cancer Father    Coronary artery disease Father    Hyperlipidemia Father    Hypertension Father    Heart attack Father    Hearing loss Sister    Hyperlipidemia Sister    Hypertension Sister    Fibromyalgia Sister    Hemochromatosis Sister    ADD / ADHD Brother    Breast cancer Neg Hx     Social History:  reports that she has been smoking cigarettes. She has a 49.00 pack-year smoking history. She has never  used smokeless tobacco. She reports that she does not currently use alcohol. She reports that she does not use drugs.  ROS: Pertinent ROS in HPI  Physical Exam: BP 116/76   Pulse 70   Ht $R'5\' 1"'eL$  (1.549 m)   Wt 115 lb (52.2 kg)   BMI 21.73 kg/m   Constitutional:  Well nourished. Alert and oriented, No acute distress. HEENT: [place] AT, mask in place.  Trachea midline Cardiovascular: No clubbing, cyanosis, or edema. Respiratory: Normal respiratory effort, no increased work of breathing. Neurologic: Grossly intact, no focal deficits, moving all 4 extremities. Psychiatric: Normal mood and affect.    Laboratory Data: Component     Latest Ref Rng & Units 02/05/2021  Glucose     65 - 99 mg/dL 85  BUN     8 - 27 mg/dL 22  Creatinine     0.57 - 1.00 mg/dL 0.84  eGFR     >59 mL/min/1.73 75  BUN/Creatinine Ratio     12 - 28 26  Sodium     134 - 144 mmol/L 143  Potassium     3.5 - 5.2 mmol/L 4.1  Chloride     96 - 106 mmol/L 102  CO2     20 - 29 mmol/L 27  Calcium     8.7 - 10.3 mg/dL 10.1  Total Protein     6.0 - 8.5 g/dL 6.4  Albumin     3.8 - 4.8 g/dL 4.4  Globulin, Total     1.5 - 4.5 g/dL 2.0  Albumin/Globulin Ratio     1.2 - 2.2 2.2  Total Bilirubin     0.0 - 1.2 mg/dL 0.3  Alkaline Phosphatase     44 - 121 IU/L 57  AST     0 - 40 IU/L 12  ALT     0 - 32 IU/L 15   Component     Latest Ref Rng & Units 02/05/2021  Hemoglobin A1C      4.8 - 5.6 % 5.6  Est. average glucose Bld gHb Est-mCnc     mg/dL 114   Urinalysis Component     Latest Ref Rng & Units 03/22/2021  Specific Gravity, UA     1.005 - 1.030 1.025  pH, UA     5.0 - 7.5 5.5  Color, UA     Yellow Orange  Appearance Ur     Clear Hazy (A)  Leukocytes,UA     Negative Negative  Protein,UA     Negative/Trace Negative  Glucose, UA     Negative Negative  Ketones, UA     Negative Trace (A)  RBC, UA     Negative Negative  Bilirubin, UA     Negative Negative  Urobilinogen, Ur     0.2 - 1.0 mg/dL 0.2  Nitrite, UA     Negative Positive (A)  Microscopic Examination      See below:   Component     Latest Ref Rng & Units 03/22/2021  WBC, UA     0 - 5 /hpf 6-10 (A)  RBC     0 - 2 /hpf 0-2  Epithelial Cells (non renal)     0 - 10 /hpf 0-10  Casts     None seen /lpf Present (A)  Cast Type     N/A Hyaline casts  Bacteria, UA     None seen/Few None seen  I have reviewed the labs.   Pertinent Imaging:  Results for [facility], Tannia J (  MRN [id number]) as of 12/26/2020 15:23  Ref. Range 12/26/2020 15:19  Scan Result Unknown 12    Assessment & Plan:    1. rUTI's -UA 6-10 WBC's -urine sent for culture -patient symptomatic at this time - will start Macrobid and adjust if necessary once urine culture results are available -suggested she start taking D-mannose  2.  Urge incontinence -worsened by possible UTI -will need to reassess upon return                                 Return in about 1 month (around 04/22/2021) for PVR and OAB questionnaire.  These notes generated with voice recognition software. I apologize for typographical errors.  Zara Council, PA-C  [facility] [address]  [place], [place] [address] [phone]

## 2021-03-22 ENCOUNTER — Encounter: Payer: Self-pay | Admitting: Urology

## 2021-03-22 ENCOUNTER — Other Ambulatory Visit: Payer: Self-pay

## 2021-03-22 ENCOUNTER — Ambulatory Visit: Payer: PPO | Admitting: Urology

## 2021-03-22 VITALS — BP 116/76 | HR 70 | Ht 61.0 in | Wt 115.0 lb

## 2021-03-22 DIAGNOSIS — N390 Urinary tract infection, site not specified: Secondary | ICD-10-CM

## 2021-03-22 DIAGNOSIS — N3941 Urge incontinence: Secondary | ICD-10-CM

## 2021-03-22 LAB — URINALYSIS, COMPLETE
Bilirubin, UA: NEGATIVE
Glucose, UA: NEGATIVE
Leukocytes,UA: NEGATIVE
Nitrite, UA: POSITIVE — AB
Protein,UA: NEGATIVE
RBC, UA: NEGATIVE
Specific Gravity, UA: 1.025 (ref 1.005–1.030)
Urobilinogen, Ur: 0.2 mg/dL (ref 0.2–1.0)
pH, UA: 5.5 (ref 5.0–7.5)

## 2021-03-22 LAB — MICROSCOPIC EXAMINATION: Bacteria, UA: NONE SEEN

## 2021-03-22 MED ORDER — NITROFURANTOIN MONOHYD MACRO 100 MG PO CAPS: 100.0000 mg | ORAL_CAPSULE | Freq: Two times a day (BID) | ORAL | 0 refills | Status: DC

## 2021-03-27 ENCOUNTER — Encounter: Payer: Self-pay | Admitting: Urology

## 2021-03-27 ENCOUNTER — Telehealth: Payer: Self-pay | Admitting: Urology

## 2021-03-27 ENCOUNTER — Ambulatory Visit: Payer: Self-pay

## 2021-03-27 NOTE — Telephone Encounter (Signed)
If she should develop shortness of breath, tongue swelling and/or throat closing, she needs to seek treatment in the ED.     We will need to wait until her hives resolve to prescribe another antibiotic.        Notified patient as instructed, patient pleased.

## 2021-03-27 NOTE — Telephone Encounter (Signed)
Advised patient to stop taking the medication and she can try some benadryl to help with the itching. Patient states she sent a message to her pcp . Advised to call here.

## 2021-03-27 NOTE — Telephone Encounter (Signed)
Pt. Started on antibiotic last week. Broke out in hives yesterday. Face and neck. Very itchy. No availability in the practice. Will call urology that started medication.    Reason for Disposition  [1] Localized rash is very painful AND [2] no fever  Answer Assessment - Initial Assessment Questions 1. APPEARANCE of RASH: "Describe the rash."      Hives 2. LOCATION: "Where is the rash located?"      Neck, face 3. NUMBER: "How many spots are there?"      Several 4. SIZE: "How big are the spots?" (Inches, centimeters or compare to size of a coin)      Small 5. ONSET: "When did the rash start?"      Yesterday 6. ITCHING: "Does the rash itch?" If Yes, ask: "How bad is the itch?"  (Scale 0-10; or none, mild, moderate, severe)     Severe 7. PAIN: "Does the rash hurt?" If Yes, ask: "How bad is the pain?"  (Scale 0-10; or none, mild, moderate, severe)    - NONE (0): no pain    - MILD (1-3): doesn't interfere with normal activities     - MODERATE (4-7): interferes with normal activities or awakens from sleep     - SEVERE (8-10): excruciating pain, unable to do any normal activities     No 8. OTHER SYMPTOMS: "Do you have any other symptoms?" (e.g., fever)     No 9. PREGNANCY: "Is there any chance you are pregnant?" "When was your last menstrual period?"     no  Protocols used: Rash or Redness - Localized-A-AH

## 2021-03-27 NOTE — Telephone Encounter (Signed)
Pt has broken out in hives all over body and face.  She saw Larene Beach on 10/6 and thinks it may be her antibiotic.  Please call pt.

## 2021-03-28 ENCOUNTER — Other Ambulatory Visit: Payer: Self-pay | Admitting: Family Medicine

## 2021-03-28 DIAGNOSIS — F410 Panic disorder [episodic paroxysmal anxiety] without agoraphobia: Secondary | ICD-10-CM

## 2021-03-28 LAB — CULTURE, URINE COMPREHENSIVE

## 2021-03-29 DIAGNOSIS — R350 Frequency of micturition: Secondary | ICD-10-CM | POA: Diagnosis not present

## 2021-03-29 DIAGNOSIS — R21 Rash and other nonspecific skin eruption: Secondary | ICD-10-CM | POA: Diagnosis not present

## 2021-03-29 NOTE — Telephone Encounter (Signed)
Requested Prescriptions  Pending Prescriptions Disp Refills  . escitalopram (LEXAPRO) 20 MG tablet [Pharmacy Med Name: ESCITALOPRAM OXALATE 20 MG TAB] 90 tablet 0    Sig: TAKE 1 TABLET BY MOUTH DAILY     Psychiatry:  Antidepressants - SSRI Passed - 03/28/2021  4:34 PM      Passed - Completed PHQ-2 or PHQ-9 in the last 360 days      Passed - Valid encounter within last 6 months    Recent Outpatient Visits          1 month ago Encounter for annual physical exam   [facility] [place], Dionne Bucy, MD   5 months ago Pathological fracture of rib with routine healing, subsequent encounter   [facility], Laurita Quint, FNP   1 year ago C. difficile diarrhea   [facility] [place], [place], [place]   1 year ago Diarrhea of presumed infectious origin   [place], [place], PA-C   1 year ago Acute cystitis without hematuria   [facility] [place], Clearnce Sorrel, [place]      Future Appointments            In 3 weeks McGowan, Gordan Payment [place]   In 4 months Bacigalupo, Dionne Bucy, MD [facility], [place]

## 2021-03-29 NOTE — Telephone Encounter (Signed)
Spoke with patient and she's no better, the benadryl is not helping and the rash is spreading to her face . Pt has been trying to get an appt with her PCP but they state they are booked out. Pt states she is a lot of pain from the psoriatic arthritis and think this and anxiety might be contributing to the rash. Pt states she may just go to the ER.

## 2021-03-30 ENCOUNTER — Other Ambulatory Visit: Payer: Self-pay | Admitting: Family Medicine

## 2021-03-30 ENCOUNTER — Encounter: Payer: Self-pay | Admitting: Family Medicine

## 2021-03-30 DIAGNOSIS — I10 Essential (primary) hypertension: Secondary | ICD-10-CM

## 2021-03-30 NOTE — Telephone Encounter (Signed)
Noted  

## 2021-03-30 NOTE — Telephone Encounter (Signed)
Requested Prescriptions  Pending Prescriptions Disp Refills  . hydrochlorothiazide (HYDRODIURIL) 25 MG tablet [Pharmacy Med Name: HYDROCHLOROTHIAZIDE 25 MG TAB] 90 tablet 0    Sig: TAKE 1 TABLET BY MOUTH DAILY     Cardiovascular: Diuretics - Thiazide Passed - 03/30/2021  1:30 PM      Passed - Ca in normal range and within 360 days    Calcium  Date Value Ref Range Status  02/05/2021 10.1 8.7 - 10.3 mg/dL Final         Passed - Cr in normal range and within 360 days    Creat  Date Value Ref Range Status  05/05/2017 0.57 0.50 - 0.99 mg/dL Final    Comment:    For patients >58 years of age, the reference limit for Creatinine is approximately 13% higher for people identified as African-American. .    Creatinine, Ser  Date Value Ref Range Status  02/05/2021 0.84 0.57 - 1.00 mg/dL Final         Passed - K in normal range and within 360 days    Potassium  Date Value Ref Range Status  02/05/2021 4.1 3.5 - 5.2 mmol/L Final         Passed - Na in normal range and within 360 days    Sodium  Date Value Ref Range Status  02/05/2021 143 134 - 144 mmol/L Final         Passed - Last BP in normal range    BP Readings from Last 1 Encounters:  03/22/21 116/76         Passed - Valid encounter within last 6 months    Recent Outpatient Visits          1 month ago Encounter for annual physical exam   [facility] [place], Dionne Bucy, MD   5 months ago Pathological fracture of rib with routine healing, subsequent encounter   [place] [facility], Laurita Quint, FNP   1 year ago C. difficile diarrhea   [facility] [place], [place], [place]   1 year ago Diarrhea of presumed infectious origin   [place], [place], PA-C   1 year ago Acute cystitis without hematuria   [facility] [place], Clearnce Sorrel, [place]      Future Appointments            In 3 weeks McGowan, Gordan Payment [place]    In 4 months Bacigalupo, Dionne Bucy, MD [facility], [place]

## 2021-04-02 ENCOUNTER — Telehealth: Payer: Self-pay | Admitting: Urology

## 2021-04-02 DIAGNOSIS — L258 Unspecified contact dermatitis due to other agents: Secondary | ICD-10-CM | POA: Diagnosis not present

## 2021-04-02 DIAGNOSIS — L309 Dermatitis, unspecified: Secondary | ICD-10-CM | POA: Diagnosis not present

## 2021-04-02 NOTE — Telephone Encounter (Signed)
Spoke with patient and she is feeling a little better after getting injection. Pt following up with dermatology today.  Speciality pharmacy stated she should have stop humira with UTI meds, because it lowers her immune system, pt states she will wait to take injection after she finish bactrim.

## 2021-04-02 NOTE — Telephone Encounter (Signed)
Would you ask if her rash has improved since she was seen at urgent care and received a steroid injection?

## 2021-04-05 ENCOUNTER — Ambulatory Visit (INDEPENDENT_AMBULATORY_CARE_PROVIDER_SITE_OTHER): Payer: PPO | Admitting: Family Medicine

## 2021-04-05 ENCOUNTER — Other Ambulatory Visit: Payer: Self-pay

## 2021-04-05 ENCOUNTER — Encounter: Payer: Self-pay | Admitting: Family Medicine

## 2021-04-05 VITALS — BP 106/63 | HR 73 | Temp 97.2°F | Resp 15 | Wt 112.7 lb

## 2021-04-05 DIAGNOSIS — R232 Flushing: Secondary | ICD-10-CM | POA: Diagnosis not present

## 2021-04-05 DIAGNOSIS — R21 Rash and other nonspecific skin eruption: Secondary | ICD-10-CM | POA: Diagnosis not present

## 2021-04-05 DIAGNOSIS — R5382 Chronic fatigue, unspecified: Secondary | ICD-10-CM

## 2021-04-05 DIAGNOSIS — R1314 Dysphagia, pharyngoesophageal phase: Secondary | ICD-10-CM

## 2021-04-05 MED ORDER — OMEPRAZOLE 20 MG PO CPDR: 20.0000 mg | DELAYED_RELEASE_CAPSULE | Freq: Two times a day (BID) | ORAL | 2 refills | Status: DC

## 2021-04-05 MED ORDER — PREDNISONE 10 MG (21) PO TBPK: ORAL_TABLET | ORAL | 0 refills | Status: DC

## 2021-04-05 NOTE — Assessment & Plan Note (Signed)
Report of ongoing heartburn and choking sensation Start PPI

## 2021-04-05 NOTE — Assessment & Plan Note (Signed)
10 days hx- thought to be possible drug rash, has been bx'd by derm Trial of prednisone dose pack

## 2021-04-05 NOTE — Progress Notes (Signed)
Established patient visit   Patient: April Best   DOB: March 20, 1951   70 y.o. Female  MRN: [id number] Visit Date: 04/05/2021  Today's healthcare provider: Gwyneth Sprout, FNP   Chief Complaint  Patient presents with   Rash   Subjective    Rash This is a recurrent problem. The current episode started in the past 7 days. The problem has been gradually improving (patient states that she saw dermatologist on 10/17) since onset. The affected locations include the face, chest and neck. The rash is characterized by burning, redness, itchiness, blistering and pain. She was exposed to a new animal. Associated symptoms include facial edema and fatigue. Pertinent negatives include no anorexia, congestion, cough, diarrhea, eye pain, fever, joint pain, nail changes, rhinorrhea, shortness of breath, sore throat or vomiting. Past treatments include topical steroids (triamincilone acetonide). The treatment provided moderate relief.     Medications: Outpatient Medications Prior to Visit  Medication Sig   azelastine (ASTELIN) 0.1 % nasal spray Place 1 spray into both nostrils daily as needed.   Cholecalciferol (VITAMIN D3 PO) Take 1,000 Int'l Units/day by mouth.   clobetasol ointment (TEMOVATE) 0.05 % APP TOPICALLY TO GLUTEAL AREA ONCE D PRF DERMATITIS   COLLAGEN PO Take 30 mLs by mouth.   diclofenac Sodium (VOLTAREN) 1 % GEL Apply 2 g topically 4 (four) times daily.   DULoxetine (CYMBALTA) 20 MG capsule Take 20 mg by mouth daily.   escitalopram (LEXAPRO) 20 MG tablet TAKE 1 TABLET BY MOUTH DAILY   gabapentin (NEURONTIN) 100 MG capsule Take 100 mg by mouth 2 (two) times daily as needed.   gabapentin (NEURONTIN) 300 MG capsule gabapentin 300 mg capsule  1 at night   gentamicin ointment (GARAMYCIN) 0.1 % Apply 1 application topically 3 (three) times daily.   HUMIRA PEN 40 MG/0.4ML PNKT every 14 (fourteen) days. Every other week   hydrochlorothiazide (HYDRODIURIL) 25 MG tablet TAKE 1 TABLET BY  MOUTH DAILY   loratadine (CLARITIN) 10 MG tablet Take 10 mg by mouth daily.   losartan (COZAAR) 25 MG tablet Take 25 mg by mouth daily.   meloxicam (MOBIC) 15 MG tablet Take 15 mg by mouth daily.   methocarbamol (ROBAXIN) 750 MG tablet methocarbamol 750 mg tablet  Take 1 tablet 3 times a day by oral route for 30 days.   nitrofurantoin, macrocrystal-monohydrate, (MACROBID) 100 MG capsule Take 1 capsule (100 mg total) by mouth every 12 (twelve) hours.   ondansetron (ZOFRAN) 4 MG tablet TAKE ONE TABLET BY MOUTH EVERY EIGHT HOURS AS NEEDED FOR NAUSEA / VOMITING   oxyCODONE-acetaminophen (PERCOCET) 10-325 MG tablet Take 1 tablet by mouth every 4 (four) hours as needed for pain.   oxyCODONE-acetaminophen (PERCOCET) 10-325 MG tablet One PO every 4-6 hours as needed for pain   rosuvastatin (CRESTOR) 5 MG tablet TAKE 1 TABLET(5 MG) BY MOUTH DAILY   traZODone (DESYREL) 100 MG tablet Take 1 tablet (100 mg total) by mouth at bedtime.   triamcinolone cream (KENALOG) 0.1 % Apply topically 2 (two) times daily as needed.   [DISCONTINUED] fluticasone (FLONASE) 50 MCG/ACT nasal spray SHAKE LIQUID AND USE 2 SPRAYS IN EACH NOSTRIL EVERY DAY (Patient taking differently: as needed.)   [DISCONTINUED] methocarbamol (ROBAXIN) 500 MG tablet Take 500 mg by mouth every 8 (eight) hours as needed.   No facility-administered medications prior to visit.    Review of Systems  Constitutional:  Positive for fatigue. Negative for fever.  HENT:  Negative for congestion, rhinorrhea and  sore throat.   Eyes:  Negative for pain.  Respiratory:  Negative for cough and shortness of breath.   Gastrointestinal:  Negative for anorexia, diarrhea and vomiting.  Musculoskeletal:  Negative for joint pain.  Skin:  Positive for rash. Negative for nail changes.       Objective    BP 106/63   Pulse 73   Temp (!) 97.2 F (36.2 C) (Oral)   Resp 15   Wt 112 lb 11.2 oz (51.1 kg)   SpO2 95%   BMI 21.29 kg/m     Physical Exam Vitals  and nursing note reviewed.  Constitutional:      General: She is not in acute distress.    Appearance: Normal appearance. She is overweight. She is not ill-appearing, toxic-appearing or diaphoretic.  HENT:     Head: Normocephalic and atraumatic.  Cardiovascular:     Rate and Rhythm: Normal rate and regular rhythm.     Pulses: Normal pulses.     Heart sounds: Normal heart sounds. No murmur heard.   No friction rub. No gallop.  Pulmonary:     Effort: Pulmonary effort is normal. No respiratory distress.     Breath sounds: Normal breath sounds. No stridor. No wheezing, rhonchi or rales.  Chest:     Chest wall: No tenderness.  Abdominal:     General: Bowel sounds are normal.     Palpations: Abdomen is soft.  Musculoskeletal:        General: No swelling, tenderness, deformity or signs of injury. Normal range of motion.     Right lower leg: No edema.     Left lower leg: No edema.  Skin:    General: Skin is warm and dry.     Capillary Refill: Capillary refill takes less than 2 seconds.     Coloration: Skin is not jaundiced or pale.     Findings: No bruising, erythema, lesion or rash.  Neurological:     General: No focal deficit present.     Mental Status: She is alert and oriented to person, place, and time. Mental status is at baseline.     Cranial Nerves: No cranial nerve deficit.     Sensory: No sensory deficit.     Motor: No weakness.     Coordination: Coordination normal.  Psychiatric:        Mood and Affect: Mood normal.        Behavior: Behavior normal.        Thought Content: Thought content normal.        Judgment: Judgment normal.    No results found for any visits on 04/05/21.  Assessment & Plan     Problem List Items Addressed This Visit       Cardiovascular and Mediastinum   Hot flashes    Check thyroid levels today given ongoing concerns as well as weight loss and generalized fatigue      Relevant Orders   TSH + free T4     Digestive    Pharyngoesophageal dysphagia    Report of ongoing heartburn and choking sensation Start PPI       Relevant Medications   omeprazole (PRILOSEC) 20 MG capsule     Musculoskeletal and Integument   Skin rash - Primary    10 days hx- thought to be possible drug rash, has been bx'd by derm Trial of prednisone dose pack      Relevant Medications   predniSONE (STERAPRED UNI-PAK 21 TAB) 10 MG (21) TBPK tablet  Other   Chronic fatigue    Slight weight loss Chronic fatigue- has been using medication to aid sleep for awhile now, does not feel that sleep is 'restful' despite no interruptions Check thyroid levels- all other labs normal within last 2 months      Relevant Orders   TSH + free T4     Return if symptoms worsen or fail to improve.     Vonna Kotyk, FNP, have reviewed all documentation for this visit. The documentation on 04/05/21 for the exam, diagnosis, procedures, and orders are all accurate and complete.    Gwyneth Sprout, [place] [phone] (phone) [phone] (fax)  [place]

## 2021-04-05 NOTE — Assessment & Plan Note (Signed)
Check thyroid levels today given ongoing concerns as well as weight loss and generalized fatigue

## 2021-04-05 NOTE — Assessment & Plan Note (Signed)
Slight weight loss Chronic fatigue- has been using medication to aid sleep for awhile now, does not feel that sleep is 'restful' despite no interruptions Check thyroid levels- all other labs normal within last 2 months

## 2021-04-06 LAB — TSH+FREE T4
Free T4: 1.15 ng/dL (ref 0.82–1.77)
TSH: 0.937 u[IU]/mL (ref 0.450–4.500)

## 2021-04-12 DIAGNOSIS — R5383 Other fatigue: Secondary | ICD-10-CM | POA: Diagnosis not present

## 2021-04-12 DIAGNOSIS — M792 Neuralgia and neuritis, unspecified: Secondary | ICD-10-CM | POA: Diagnosis not present

## 2021-04-12 DIAGNOSIS — G5603 Carpal tunnel syndrome, bilateral upper limbs: Secondary | ICD-10-CM | POA: Diagnosis not present

## 2021-04-15 ENCOUNTER — Other Ambulatory Visit: Payer: Self-pay

## 2021-04-15 ENCOUNTER — Emergency Department
Admission: EM | Admit: 2021-04-15 | Discharge: 2021-04-15 | Disposition: A | Discharge status: Home / Self Care | Payer: PPO | Attending: Student in an Organized Health Care Education/Training Program | Admitting: Student in an Organized Health Care Education/Training Program

## 2021-04-15 DIAGNOSIS — R52 Pain, unspecified: Secondary | ICD-10-CM

## 2021-04-15 DIAGNOSIS — M542 Cervicalgia: Secondary | ICD-10-CM | POA: Diagnosis not present

## 2021-04-15 DIAGNOSIS — M069 Rheumatoid arthritis, unspecified: Secondary | ICD-10-CM

## 2021-04-15 DIAGNOSIS — F17210 Nicotine dependence, cigarettes, uncomplicated: Secondary | ICD-10-CM | POA: Insufficient documentation

## 2021-04-15 DIAGNOSIS — M791 Myalgia, unspecified site: Secondary | ICD-10-CM | POA: Diagnosis not present

## 2021-04-15 DIAGNOSIS — I10 Essential (primary) hypertension: Secondary | ICD-10-CM | POA: Diagnosis not present

## 2021-04-15 DIAGNOSIS — Z79899 Other long term (current) drug therapy: Secondary | ICD-10-CM | POA: Insufficient documentation

## 2021-04-15 MED ORDER — FENTANYL CITRATE (PF) 250 MCG/5ML IJ SOLN
100.0000 ug | Freq: Once | INTRAMUSCULAR | Status: AC
Start: 2021-04-15 — End: 2021-04-15
  Administered 2021-04-15: 100 ug via INTRAMUSCULAR
  Filled 2021-04-15: qty 5

## 2021-04-15 MED ORDER — OXYCODONE HCL 5 MG PO TABS
10.0000 mg | ORAL_TABLET | Freq: Once | ORAL | Status: AC
Start: 2021-04-15 — End: 2021-04-15
  Administered 2021-04-15: 10 mg via ORAL
  Filled 2021-04-15: qty 2

## 2021-04-15 MED ORDER — PREDNISONE 10 MG (21) PO TBPK: ORAL_TABLET | ORAL | 0 refills | Status: DC

## 2021-04-15 NOTE — ED Triage Notes (Signed)
Pt in via EMS from home with c/o arthritis pain. Pt was given a new med Friday. Pt reports took the med Friday , slept all day Saturday. Pt MD told her not to take anymore because it made her sleep too much so she has not taken any more and now has pain.

## 2021-04-15 NOTE — ED Notes (Signed)
Follow up pcpc info provided rx sent to pharmacy inof provided

## 2021-04-15 NOTE — ED Provider Notes (Signed)
[facility] Emergency Department Provider Note ____________________________________________   Event Date/Time   First MD Initiated Contact with Patient 04/15/21 1229     (approximate)  I have reviewed the triage vital signs and the nursing notes.   HISTORY  Chief Complaint Pain  HPI April Best is a 70 y.o. female with history of psoriatic arthritis, rheumatoid arthritis and remaining history as listed below presents to the emergency department for treatment and evaluation of generalized body pain.  Patient states that the pain had been better when she was on prednisone but she has finished her taper Dosepak.  She is not on daily prednisone.  She has also taken her oxycodone without any relief.      Past Medical History:  Diagnosis Date   Allergic rhinitis    Allergy    Annular oral lichen planus    Anxiety    Arthritis    Depression    Deviated nasal septum    nasal obstruction   Guaiac positive stools    history   H. 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   ABDOMINOPLASTY     APPENDECTOMY     BREAST BIOPSY  Right    neg- core   COLONOSCOPY  03/2006   COLONOSCOPY WITH PROPOFOL N/A 11/19/2017   Procedure: COLONOSCOPY WITH PROPOFOL;  Surgeon: Toledo, Benay Pike, MD;  Location: [facility] ENDOSCOPY;  Service: Gastroenterology;  Laterality: N/A;   ENDOSCOPIC CONCHA BULLOSA RESECTION Right 01/30/2021   Procedure: ENDOSCOPIC CONCHA BULLOSA RESECTION;  Surgeon: Clyde Canterbury, MD;  Location: [place];  Service: ENT;  Laterality: Right;   RHINOPLASTY  2002   SEPTOPLASTY N/A 01/30/2021   Procedure: SEPTOPLASTY;  Surgeon: Clyde Canterbury, MD;  Location: [place];  Service: ENT;  Laterality: N/A;   UPPER GI ENDOSCOPY  2008    Prior to Admission medications   Medication Sig Start Date End Date Taking? Authorizing Provider  predniSONE (STERAPRED UNI-PAK 21 TAB) 10 MG (21) TBPK tablet Take 6 tablets on the first day and decrease by 1 tablet each day until finished. 04/15/21  Yes Karilynn Carranza B, FNP  azelastine (ASTELIN) 0.1 % nasal spray Place 1 spray into both nostrils daily as needed.    [provider]  Cholecalciferol (VITAMIN D3 PO) Take 1,000 Int'l Units/day by mouth.    [provider]  clobetasol ointment (TEMOVATE) 0.05 % APP TOPICALLY TO GLUTEAL AREA ONCE D PRF DERMATITIS 10/07/18   Trinna Post, PA-C  COLLAGEN PO Take 30 mLs by mouth.    [provider]  diclofenac Sodium (VOLTAREN) 1 %  GEL Apply 2 g topically 4 (four) times daily. 09/07/20   [provider]  DULoxetine (CYMBALTA) 20 MG capsule Take 20 mg by mouth daily. 05/24/20   [provider]  escitalopram (LEXAPRO) 20 MG tablet TAKE 1 TABLET BY MOUTH DAILY 03/29/21   Bacigalupo, Dionne Bucy, MD  gabapentin (NEURONTIN) 100 MG capsule Take 100 mg by mouth 2 (two) times daily as needed. 11/05/19   [provider]  gabapentin (NEURONTIN) 300 MG capsule gabapentin 300 mg capsule  1 at night    [provider]  gentamicin ointment (GARAMYCIN) 0.1 % Apply 1 application topically  3 (three) times daily.    [provider]  HUMIRA PEN 40 MG/0.4ML PNKT every 14 (fourteen) days. Every other week 09/11/20   [provider]  hydrochlorothiazide (HYDRODIURIL) 25 MG tablet TAKE 1 TABLET BY MOUTH DAILY 03/30/21   Virginia Crews, MD  loratadine (CLARITIN) 10 MG tablet Take 10 mg by mouth daily.    [provider]  losartan (COZAAR) 25 MG tablet Take 25 mg by mouth daily. 12/31/19   [provider]  meloxicam (MOBIC) 15 MG tablet Take 15 mg by mouth daily. 06/25/19   [provider]  methocarbamol (ROBAXIN) 750 MG tablet methocarbamol 750 mg tablet  Take 1 tablet 3 times a day by oral route for 30 days.    [provider]  nitrofurantoin, macrocrystal-monohydrate, (MACROBID) 100 MG capsule Take 1 capsule (100 mg total) by mouth every 12 (twelve) hours. 03/22/21   Zara Council A, PA-C  omeprazole (PRILOSEC) 20 MG capsule Take 1 capsule (20 mg total) by mouth 2 (two) times daily before a meal. 04/05/21   Gwyneth Sprout, FNP  ondansetron (ZOFRAN) 4 MG tablet TAKE ONE TABLET BY MOUTH EVERY EIGHT HOURS AS NEEDED FOR NAUSEA / VOMITING 04/07/20   Trinna Post, PA-C  oxyCODONE-acetaminophen (PERCOCET) 10-325 MG tablet Take 1 tablet by mouth every 4 (four) hours as needed for pain.    [provider]  oxyCODONE-acetaminophen (PERCOCET) 10-325 MG tablet One PO every 4-6 hours as needed for pain 01/30/21   Clyde Canterbury, MD  rosuvastatin (CRESTOR) 5 MG tablet TAKE 1 TABLET(5 MG) BY MOUTH DAILY 01/04/20   Trinna Post, PA-C  traZODone (DESYREL) 100 MG tablet Take 1 tablet (100 mg total) by mouth at bedtime. 05/17/20   Trinna Post, PA-C  triamcinolone cream (KENALOG) 0.1 % Apply topically 2 (two) times daily as needed. 04/02/21   [provider]    Allergies Enbrel [etanercept], Augmentin  [amoxicillin-pot clavulanate], Cefdinir, Lisinopril, and Macrobid [nitrofurantoin]  Family History  Problem Relation  Age of Onset   Hypertension Mother    Hyperlipidemia Mother    Hypothyroidism Mother    Fibromyalgia Mother    Hearing loss Mother    Lung cancer Father    Coronary artery disease Father    Hyperlipidemia Father    Hypertension Father    Heart attack Father    Hearing loss Sister    Hyperlipidemia Sister    Hypertension Sister    Fibromyalgia Sister    Hemochromatosis Sister    ADD / ADHD Brother    Breast cancer Neg Hx     Social History Social History   Tobacco Use   Smoking status: Every Day    Packs/day: 1.00    Years: 49.00    Pack years: 49.00    Types: Cigarettes   Smokeless tobacco: Never   Tobacco comments:    pt declines  smoking cessation ( smoked since age 89)  Vaping Use   Vaping Use: Never used  Substance Use Topics   Alcohol use: Not Currently    Alcohol/week: 0.0 standard drinks   Drug use: No    Review of Systems  Constitutional: No fever/chills Eyes: No visual changes. ENT: No sore throat. Cardiovascular: Denies chest pain. Respiratory: Denies shortness of breath. Gastrointestinal: No abdominal pain.  No nausea, no vomiting.  No diarrhea.  No constipation. Genitourinary: Negative for dysuria. Musculoskeletal: Positive for generalized pain. Skin: Negative for rash. Neurological: Negative for headaches, focal weakness or numbness  ____________________________________________   PHYSICAL EXAM:  VITAL SIGNS: ED Triage Vitals  Enc Vitals Group     BP 04/15/21 1121 112/62     Pulse Rate 04/15/21 1121 74     Resp 04/15/21 1121 16     Temp 04/15/21 1121 (!) 97.5 F (36.4 C)     Temp Source 04/15/21 1121 Oral     SpO2 04/15/21 1121 92 %     Weight 04/15/21 1123 116 lb (52.6 kg)     Height 04/15/21 1123 5\' 1"  (1.549 m)     Head Circumference --      Peak Flow --      Pain Score 04/15/21 1122 10     Pain Loc --      Pain Edu? --      Excl. in [place]? --     Constitutional: Alert and oriented. Well appearing and in no acute  distress. Eyes: Conjunctivae are normal. PERRL. EOMI. Head: Atraumatic. Nose: No congestion/rhinnorhea. Mouth/Throat: Mucous membranes are moist.  Oropharynx non-erythematous. Neck: No stridor.   Hematological/Lymphatic/Immunilogical: No cervical lymphadenopathy. Cardiovascular: Normal rate, regular rhythm. Grossly normal heart sounds.  Good peripheral circulation. Respiratory: Normal respiratory effort.  No retractions. Lungs CTAB. Gastrointestinal: Soft and nontender. No distention. No abdominal bruits. No CVA tenderness. Genitourinary:  Musculoskeletal: No lower extremity tenderness nor edema.  No joint effusions. Neurologic:  Normal speech and language. No gross focal neurologic deficits are appreciated. No gait instability. Skin:  Skin is warm, dry and intact. No rash noted. Psychiatric: Mood and affect are normal. Speech and behavior are normal.  ____________________________________________   LABS (all labs ordered are listed, but only abnormal results are displayed)  Labs Reviewed - No data to display ____________________________________________  EKG  Not indicated ____________________________________________  RADIOLOGY  ED MD interpretation:    Not indicated I, Sherrie George, personally viewed and evaluated these images (plain radiographs) as part of my medical decision making, as well as reviewing the written report by the radiologist.  Official radiology report(s): No results found.  ____________________________________________   PROCEDURES  Procedure(s) performed (including Critical Care):  Procedures  ____________________________________________   INITIAL IMPRESSION / ASSESSMENT AND PLAN   70 year old female presents to the emergency department for pain control.  See HPI for further details.  Patient states that she has taken her home medications without relief. Patient requesting pain medication and steroid. She denies injury.    ED COURSE  Lab  studies and provider note from October 20 reviewed. Patient declines repeat today. IM fentanyl given with little relief. Roxicodone IR ordered. Prednisone taper prescribed. She is to follow up with PCP or rheumatology.      ___________________________________________   FINAL CLINICAL IMPRESSION(S) / ED DIAGNOSES  Final diagnoses:  Generalized pain  Rheumatoid arthritis flare [facility])     ED Discharge Orders          Ordered    predniSONE (STERAPRED UNI-PAK  21 TAB) 10 MG (21) TBPK tablet        04/15/21 1346             April Best was evaluated in Emergency Department on 04/15/2021 for the symptoms described in the history of present illness. She was evaluated in the context of the global COVID-19 pandemic, which necessitated consideration that the patient might be at risk for infection with the SARS-CoV-2 virus that causes COVID-19. Institutional protocols and algorithms that pertain to the evaluation of patients at risk for COVID-19 are in a state of rapid change based on information released by regulatory bodies including the CDC and federal and state organizations. These policies and algorithms were followed during the patient's care in the ED.   Note:  This document was prepared using Dragon voice recognition software and may include unintentional dictation errors.    Victorino Dike, FNP 04/15/21 1526    Merlyn Lot, MD 04/15/21 1550

## 2021-04-15 NOTE — Discharge Instructions (Addendum)
Please follow up with rheumatology.  Return to the ER for symptoms that change or worsen if unable to see your primary care or specialist.

## 2021-04-15 NOTE — ED Triage Notes (Addendum)
Pt states she is having pain all over in the all of her joints over the weekend, states she has a hx of scoriatic arthritis  and this feel same but worse then normal, states she was prescribed a new med for fatigue by Dr Manuella Ghazi she took Friday morning and she says she had severe fatigue and broke out into a rash on her legs so they told her not to take anymore. States she took 10mg  percocet at 4am and again 830am

## 2021-04-17 ENCOUNTER — Encounter: Payer: Self-pay | Admitting: Emergency Medicine

## 2021-04-17 ENCOUNTER — Other Ambulatory Visit: Payer: Self-pay

## 2021-04-17 DIAGNOSIS — I10 Essential (primary) hypertension: Secondary | ICD-10-CM | POA: Insufficient documentation

## 2021-04-17 DIAGNOSIS — R110 Nausea: Secondary | ICD-10-CM | POA: Diagnosis not present

## 2021-04-17 DIAGNOSIS — L500 Allergic urticaria: Secondary | ICD-10-CM | POA: Insufficient documentation

## 2021-04-17 DIAGNOSIS — R519 Headache, unspecified: Secondary | ICD-10-CM | POA: Insufficient documentation

## 2021-04-17 DIAGNOSIS — R21 Rash and other nonspecific skin eruption: Secondary | ICD-10-CM | POA: Diagnosis present

## 2021-04-17 DIAGNOSIS — G4489 Other headache syndrome: Secondary | ICD-10-CM | POA: Diagnosis not present

## 2021-04-17 DIAGNOSIS — R0902 Hypoxemia: Secondary | ICD-10-CM | POA: Diagnosis not present

## 2021-04-17 DIAGNOSIS — T782XXA Anaphylactic shock, unspecified, initial encounter: Secondary | ICD-10-CM | POA: Diagnosis not present

## 2021-04-17 DIAGNOSIS — T7840XA Allergy, unspecified, initial encounter: Secondary | ICD-10-CM | POA: Diagnosis not present

## 2021-04-17 DIAGNOSIS — F17210 Nicotine dependence, cigarettes, uncomplicated: Secondary | ICD-10-CM | POA: Insufficient documentation

## 2021-04-17 DIAGNOSIS — L509 Urticaria, unspecified: Secondary | ICD-10-CM | POA: Diagnosis not present

## 2021-04-17 LAB — BASIC METABOLIC PANEL
Anion gap: 9 (ref 5–15)
BUN: 19 mg/dL (ref 8–23)
CO2: 30 mmol/L (ref 22–32)
Calcium: 9 mg/dL (ref 8.9–10.3)
Chloride: 95 mmol/L — ABNORMAL LOW (ref 98–111)
Creatinine, Ser: 0.6 mg/dL (ref 0.44–1.00)
GFR, Estimated: >60 mL/min (ref >60)
Glucose, Bld: 109 mg/dL — ABNORMAL HIGH (ref 70–99)
Potassium: 2.8 mmol/L — ABNORMAL LOW (ref 3.5–5.1)
Sodium: 134 mmol/L — ABNORMAL LOW (ref 135–145)

## 2021-04-17 LAB — CBC WITH DIFFERENTIAL/PLATELET
Abs Immature Granulocytes: 0.14 10*3/uL — ABNORMAL HIGH (ref 0.00–0.07)
Basophils Absolute: 0.1 10*3/uL (ref 0.0–0.1)
Basophils Relative: 1 %
Eosinophils Absolute: 0 10*3/uL (ref 0.0–0.5)
Eosinophils Relative: 0 %
HCT: 43.7 % (ref 36.0–46.0)
Hemoglobin: 14.6 g/dL (ref 12.0–15.0)
Immature Granulocytes: 1 %
Lymphocytes Relative: 6 %
Lymphs Abs: 1.3 10*3/uL (ref 0.7–4.0)
MCH: 32.7 pg (ref 26.0–34.0)
MCHC: 33.4 g/dL (ref 30.0–36.0)
MCV: 98 fL (ref 80.0–100.0)
Monocytes Absolute: 1 10*3/uL (ref 0.1–1.0)
Monocytes Relative: 5 %
Neutro Abs: 18 10*3/uL — ABNORMAL HIGH (ref 1.7–7.7)
Neutrophils Relative %: 87 %
Platelets: 228 10*3/uL (ref 150–400)
RBC: 4.46 MIL/uL (ref 3.87–5.11)
RDW: 12.1 % (ref 11.5–15.5)
WBC: 20.6 10*3/uL — ABNORMAL HIGH (ref 4.0–10.5)
nRBC: 0 % (ref 0.0–0.2)

## 2021-04-17 LAB — CBC
HCT: 41.6 % (ref 36.0–46.0)
Hemoglobin: 14.7 g/dL (ref 12.0–15.0)
MCH: 34 pg (ref 26.0–34.0)
MCHC: 35.3 g/dL (ref 30.0–36.0)
MCV: 96.3 fL (ref 80.0–100.0)
Platelets: 209 10*3/uL (ref 150–400)
RBC: 4.32 MIL/uL (ref 3.87–5.11)
RDW: 11.9 % (ref 11.5–15.5)
WBC: 20.1 10*3/uL — ABNORMAL HIGH (ref 4.0–10.5)
nRBC: 0 % (ref 0.0–0.2)

## 2021-04-17 MED ORDER — METHYLPREDNISOLONE SODIUM SUCC 125 MG IJ SOLR
125.0000 mg | Freq: Once | INTRAMUSCULAR | Status: AC
  Administered 2021-04-17: 125 mg via INTRAMUSCULAR
  Filled 2021-04-17: qty 2

## 2021-04-17 MED ORDER — FAMOTIDINE 20 MG PO TABS
20.0000 mg | ORAL_TABLET | Freq: Once | ORAL | Status: AC
  Administered 2021-04-17: 20 mg via ORAL
  Filled 2021-04-17: qty 1

## 2021-04-17 MED ORDER — DIPHENHYDRAMINE HCL 50 MG/ML IJ SOLN
50.0000 mg | Freq: Once | INTRAMUSCULAR | Status: AC
  Administered 2021-04-17: 50 mg via INTRAMUSCULAR
  Filled 2021-04-17: qty 1

## 2021-04-17 NOTE — ED Notes (Signed)
Pt reports no improvement in rash after meds received; J Cutthriel PA notified and meds ordered

## 2021-04-17 NOTE — ED Triage Notes (Signed)
Pt via EMS from home. EMS reports and allergic reaction to Modafinil, pt took 1.5 pills yesterday. Pt has a rash since yesterday on her face. Pt airway is intact.   Pt c/o headache, back pain, rash, and nausea. EMS gave PO benadryl, 650 of Tylenol, and ODT Zofran. Denies CP/SOB.   Pt is A&Ox4 and NAD.

## 2021-04-18 ENCOUNTER — Emergency Department
Admission: EM | Admit: 2021-04-18 | Discharge: 2021-04-18 | Disposition: A | Discharge status: Home / Self Care | Payer: PPO | Attending: Emergency Medicine | Admitting: Emergency Medicine

## 2021-04-18 DIAGNOSIS — T7840XA Allergy, unspecified, initial encounter: Secondary | ICD-10-CM

## 2021-04-18 MED ORDER — POTASSIUM CHLORIDE 20 MEQ PO PACK
60.0000 meq | PACK | Freq: Once | ORAL | Status: AC
  Administered 2021-04-18: 60 meq via ORAL
  Filled 2021-04-18: qty 3

## 2021-04-18 MED ORDER — PREDNISONE 20 MG PO TABS: 40.0000 mg | ORAL_TABLET | Freq: Every day | ORAL | 0 refills | Status: AC

## 2021-04-18 MED ORDER — DIPHENHYDRAMINE HCL 25 MG PO TABS: 25.0000 mg | ORAL_TABLET | Freq: Four times a day (QID) | ORAL | 0 refills | Status: DC

## 2021-04-18 MED ORDER — NAPROXEN 500 MG PO TABS
500.0000 mg | ORAL_TABLET | Freq: Once | ORAL | Status: AC
  Administered 2021-04-18: 500 mg via ORAL
  Filled 2021-04-18 (×2): qty 1

## 2021-04-18 MED ORDER — ACETAMINOPHEN 500 MG PO TABS
1000.0000 mg | ORAL_TABLET | Freq: Once | ORAL | Status: AC
  Administered 2021-04-18: 1000 mg via ORAL
  Filled 2021-04-18 (×2): qty 2

## 2021-04-18 MED ORDER — DIPHENHYDRAMINE HCL 25 MG PO CAPS
50.0000 mg | ORAL_CAPSULE | Freq: Once | ORAL | Status: AC
  Administered 2021-04-18: 50 mg via ORAL
  Filled 2021-04-18 (×2): qty 2

## 2021-04-18 NOTE — ED Provider Notes (Signed)
[facility] Emergency Department Provider Note  ____________________________________________  Time seen: Approximately 1:17 AM  I have reviewed the triage vital signs and the nursing notes.   HISTORY  Chief Complaint Rash and Headache    HPI April Best is a 70 y.o. female with a past history of hypertension, hyperlipidemia, psoriatic arthritis who comes ED complaining of diffuse uncomfortable skin rash starting October 31, initially on the face and now generalized.  No chest pain or shortness of breath.  No wheezing or cough.  No vomiting or diarrhea.  Symptoms started after recently starting modafinil for sleep disorder.  She is having some side effects from that as well including headache and nausea.  She was given Benadryl and Tylenol earlier today.  Prior to being placed in a treatment room, she received additional Benadryl and IV Solu-Medrol.  She reports that her rash is now improving and she is feeling better.    Past Medical History:  Diagnosis Date   Allergic rhinitis    Allergy    Annular oral lichen planus    Anxiety    Arthritis    Depression    Deviated nasal septum    nasal obstruction   Guaiac positive stools    history   H. 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   ABDOMINOPLASTY     APPENDECTOMY     BREAST BIOPSY Right    neg- core   COLONOSCOPY  03/2006   COLONOSCOPY WITH PROPOFOL N/A 11/19/2017   Procedure: COLONOSCOPY WITH PROPOFOL;  Surgeon: Toledo, Benay Pike, MD;  Location: [facility] ENDOSCOPY;  Service: Gastroenterology;  Laterality: N/A;   ENDOSCOPIC CONCHA BULLOSA RESECTION Right 01/30/2021   Procedure: ENDOSCOPIC CONCHA BULLOSA RESECTION;  Surgeon: Clyde Canterbury, MD;  Location: [place];  Service: ENT;  Laterality: Right;   RHINOPLASTY  2002   SEPTOPLASTY N/A 01/30/2021   Procedure: SEPTOPLASTY;  Surgeon: Clyde Canterbury, MD;  Location: [place];  Service: ENT;  Laterality: N/A;   UPPER GI ENDOSCOPY  2008     Prior to Admission medications   Medication Sig Start Date End Date Taking? Authorizing Provider  diphenhydrAMINE (BENADRYL) 25 MG tablet Take 1 tablet (25 mg total) by mouth every 6 (six) hours for 3 days. 04/18/21 04/21/21 Yes Carrie Mew, MD  predniSONE (DELTASONE) 20 MG tablet Take 2 tablets (40 mg total) by mouth daily with breakfast for 4 days. 04/18/21 04/22/21 Yes Carrie Mew, MD  azelastine (ASTELIN) 0.1 % nasal spray Place 1 spray into both nostrils daily as needed.    [provider]  Cholecalciferol (VITAMIN D3 PO) Take 1,000 Int'l Units/day by mouth.    [provider]  clobetasol ointment (TEMOVATE) 0.05 % APP TOPICALLY TO GLUTEAL AREA ONCE D PRF DERMATITIS 10/07/18   Trinna Post, PA-C  COLLAGEN PO Take 30 mLs by mouth.    [provider]  diclofenac Sodium (VOLTAREN) 1 % GEL Apply 2 g topically 4 (four) times daily. 09/07/20   [provider]  DULoxetine (CYMBALTA) 20 MG capsule Take 20 mg by mouth daily. 05/24/20   [provider]  escitalopram (LEXAPRO) 20 MG tablet TAKE  1 TABLET BY MOUTH DAILY 03/29/21   Bacigalupo, Dionne Bucy, MD  gabapentin (NEURONTIN) 100 MG capsule Take 100 mg by mouth 2 (two) times daily as needed. 11/05/19   [provider]  gabapentin (NEURONTIN) 300 MG capsule gabapentin 300 mg capsule  1 at night    [provider]  gentamicin ointment (GARAMYCIN) 0.1 % Apply 1 application topically 3 (three) times daily.    [provider]  HUMIRA PEN 40 MG/0.4ML PNKT every 14 (fourteen) days. Every other week 09/11/20   [provider]  hydrochlorothiazide (HYDRODIURIL) 25 MG tablet TAKE 1 TABLET BY MOUTH DAILY 03/30/21   Virginia Crews, MD  loratadine (CLARITIN) 10 MG tablet Take 10 mg by mouth daily.    [provider]  losartan (COZAAR) 25 MG tablet Take 25 mg by mouth daily. 12/31/19   [provider]  meloxicam (MOBIC) 15 MG tablet Take 15 mg by mouth daily. 06/25/19   [provider]  methocarbamol (ROBAXIN) 750 MG tablet methocarbamol 750 mg tablet  Take 1 tablet 3 times a day by oral route for 30 days.    [provider]  nitrofurantoin, macrocrystal-monohydrate, (MACROBID) 100 MG capsule Take 1 capsule (100 mg total) by mouth every 12 (twelve) hours. 03/22/21   Zara Council A, PA-C  omeprazole (PRILOSEC) 20 MG capsule Take 1 capsule (20 mg total) by mouth 2 (two) times daily before a meal. 04/05/21   Gwyneth Sprout, FNP  ondansetron (ZOFRAN) 4 MG tablet TAKE ONE TABLET BY MOUTH EVERY EIGHT HOURS AS NEEDED FOR NAUSEA / VOMITING 04/07/20   Trinna Post, PA-C  oxyCODONE-acetaminophen (PERCOCET) 10-325 MG tablet Take 1 tablet by mouth every 4 (four) hours as needed for pain.    [provider]  oxyCODONE-acetaminophen (PERCOCET) 10-325 MG tablet One PO every 4-6 hours as needed for pain 01/30/21   Clyde Canterbury, MD  rosuvastatin (CRESTOR) 5 MG tablet TAKE 1 TABLET(5 MG) BY MOUTH DAILY 01/04/20   Trinna Post, PA-C  traZODone (DESYREL) 100 MG tablet Take 1  tablet (100 mg total) by mouth at bedtime. 05/17/20   Trinna Post, PA-C  triamcinolone cream (KENALOG) 0.1 % Apply topically 2 (two) times daily as needed. 04/02/21   [provider]     Allergies Enbrel [etanercept], Augmentin  [amoxicillin-pot clavulanate], Cefdinir, Lisinopril, and Macrobid [nitrofurantoin]   Family History  Problem Relation Age of Onset   Hypertension Mother    Hyperlipidemia Mother    Hypothyroidism Mother    Fibromyalgia Mother    Hearing loss Mother    Lung cancer Father    Coronary artery disease Father    Hyperlipidemia Father    Hypertension Father    Heart attack Father    Hearing loss Sister    Hyperlipidemia Sister    Hypertension Sister    Fibromyalgia Sister    Hemochromatosis Sister    ADD / ADHD Brother  Breast cancer Neg Hx     Social History Social History   Tobacco Use   Smoking status: Every Day    Packs/day: 1.00    Years: 49.00    Pack years: 49.00    Types: Cigarettes   Smokeless tobacco: Never   Tobacco comments:    pt declines smoking cessation ( smoked since age 49)  Vaping Use   Vaping Use: Never used  Substance Use Topics   Alcohol use: Not Currently    Alcohol/week: 0.0 standard drinks   Drug use: No    Review of Systems  Constitutional:   No fever or chills.  Cardiovascular:   No chest pain or syncope. Respiratory:   No dyspnea or cough. Gastrointestinal:   Negative for abdominal pain, vomiting and diarrhea.  Musculoskeletal:   Negative for focal pain or swelling All other systems reviewed and are negative except as documented above in ROS and HPI.  ____________________________________________   PHYSICAL EXAM:  VITAL SIGNS: ED Triage Vitals [04/17/21 1606]  Enc Vitals Group     BP 118/72     Pulse Rate 82     Resp 20     Temp 98.1 F (36.7 C)     Temp Source Oral     SpO2 94 %     Weight 116 lb (52.6 kg)     Height 5\' 1"  (1.549 m)     Head Circumference      Peak Flow       Pain Score      Pain Loc      Pain Edu?      Excl. in [place]?     Vital signs reviewed, nursing assessments reviewed.   Constitutional:   Alert and oriented. Non-toxic appearance. Eyes:   Conjunctivae are normal. EOMI. PERRL. ENT      Head:   Normocephalic and atraumatic.      Nose: Normal      Mouth/Throat: No oral swelling      Neck:   No meningismus. Full ROM. Hematological/Lymphatic/Immunilogical:   No cervical lymphadenopathy. Cardiovascular:   RRR. Symmetric bilateral radial and DP pulses.  No murmurs. Cap refill less than 2 seconds. Respiratory:   Normal respiratory effort without tachypnea/retractions. Breath sounds are clear and equal bilaterally. No wheezes/rales/rhonchi. Gastrointestinal:   Soft and nontender. Non distended. There is no CVA tenderness.  No rebound, rigidity, or guarding. Genitourinary:   deferred Musculoskeletal:   Normal range of motion in all extremities. No joint effusions.  No lower extremity tenderness.  No edema. Neurologic:   Normal speech and language.  Motor grossly intact. No acute focal neurologic deficits are appreciated.  Skin:    Skin is warm, dry and intact.  There is a diffuse urticarial rash which is generalized.  Faint.  Patient's family member at bedside shows a picture of the rash while the patient was in the waiting room several hours ago which demonstrate a generalized bright red raised urticarial rash which is now improved ____________________________________________    LABS (pertinent positives/negatives) (all labs ordered are listed, but only abnormal results are displayed) Labs Reviewed  CBC - Abnormal; Notable for the following components:      Result Value   WBC 20.1 (*)    All other components within normal limits  BASIC METABOLIC PANEL - Abnormal; Notable for the following components:   Sodium 134 (*)    Potassium 2.8 (*)    Chloride 95 (*)    Glucose, Bld 109 (*)  All other components within normal limits  CBC WITH  DIFFERENTIAL/PLATELET - Abnormal; Notable for the following components:   WBC 20.6 (*)    Neutro Abs 18.0 (*)    Abs Immature Granulocytes 0.14 (*)    All other components within normal limits   ____________________________________________   EKG    ____________________________________________    RADIOLOGY  No results found.  ____________________________________________   PROCEDURES Procedures  ____________________________________________    CLINICAL IMPRESSION / ASSESSMENT AND PLAN / ED COURSE  Medications ordered in the ED: Medications  diphenhydrAMINE (BENADRYL) injection 50 mg (50 mg Intramuscular Given 04/17/21 1801)  famotidine (PEPCID) tablet 20 mg (20 mg Oral Given 04/17/21 1801)  methylPREDNISolone sodium succinate (SOLU-MEDROL) 125 mg/2 mL injection 125 mg (125 mg Intramuscular Given 04/17/21 2015)  potassium chloride (KLOR-CON) packet 60 mEq (60 mEq Oral Given 04/18/21 0123)  naproxen (NAPROSYN) tablet 500 mg (500 mg Oral Given 04/18/21 0121)  acetaminophen (TYLENOL) tablet 1,000 mg (1,000 mg Oral Given 04/18/21 0120)  diphenhydrAMINE (BENADRYL) capsule 50 mg (50 mg Oral Given 04/18/21 0121)    Pertinent labs & imaging results that were available during my care of the patient were reviewed by me and considered in my medical decision making (see chart for details).  April Best was evaluated in Emergency Department on 04/18/2021 for the symptoms described in the history of present illness. She was evaluated in the context of the global COVID-19 pandemic, which necessitated consideration that the patient might be at risk for infection with the SARS-CoV-2 virus that causes COVID-19. Institutional protocols and algorithms that pertain to the evaluation of patients at risk for COVID-19 are in a state of rapid change based on information released by regulatory bodies including the CDC and federal and state organizations. These policies and algorithms were followed during  the patient's care in the ED.   Patient presents with urticarial rash which appears to be due to modafinil as a new medication.  Not anaphylaxis.  Limited to urticarial rash.  Improving with steroids and Benadryl.  We will continue these at home.  She is stable for discharge.  Doubt Stevens-Johnson/TEN, cellulitis, syphilis, sepsis.  Patient understands to discontinue the modafinil and follow-up with her PCP regarding monitoring symptoms and trying different medicine as needed.      ____________________________________________   FINAL CLINICAL IMPRESSION(S) / ED DIAGNOSES    Final diagnoses:  Allergic reaction, initial encounter     ED Discharge Orders          Ordered    predniSONE (DELTASONE) 20 MG tablet  Daily with breakfast        04/18/21 0119    diphenhydrAMINE (BENADRYL) 25 MG tablet  Every 6 hours        04/18/21 0119            Portions of this note were generated with dragon dictation software. Dictation errors may occur despite best attempts at proofreading.    Carrie Mew, MD 04/18/21 [phone]

## 2021-04-18 NOTE — Discharge Instructions (Addendum)
Stop taking Modafinil and follow up with your doctor for continued monitoring of your symptoms.

## 2021-04-18 NOTE — ED Notes (Signed)
While taking pt vitals, pt stated that the shot did not work.

## 2021-04-19 ENCOUNTER — Telehealth: Payer: Self-pay

## 2021-04-19 DIAGNOSIS — R2990 Unspecified symptoms and signs involving the nervous system: Secondary | ICD-10-CM | POA: Diagnosis not present

## 2021-04-19 DIAGNOSIS — M792 Neuralgia and neuritis, unspecified: Secondary | ICD-10-CM | POA: Diagnosis not present

## 2021-04-19 DIAGNOSIS — R5383 Other fatigue: Secondary | ICD-10-CM | POA: Diagnosis not present

## 2021-04-19 DIAGNOSIS — E876 Hypokalemia: Secondary | ICD-10-CM | POA: Diagnosis not present

## 2021-04-19 DIAGNOSIS — F419 Anxiety disorder, unspecified: Secondary | ICD-10-CM | POA: Diagnosis not present

## 2021-04-19 NOTE — Telephone Encounter (Signed)
Can see if anyone has availability. If I have an open appt ok to use it. If there is a nurse visit, can double book.

## 2021-04-19 NOTE — Telephone Encounter (Signed)
Copied from [place] [phone]. Topic: Appointment Scheduling - Scheduling Inquiry for Clinic >> Apr 19, 2021 11:06 AM April Best E wrote: Reason for CRM: Pt was seen in ER and her potassium level was 2/ pt was advised to schedule a follow up with  PCP asap / please advise

## 2021-04-19 NOTE — Telephone Encounter (Signed)
Please review.  Will you be able to work her in somewhere?  Thanks,   -Mickel Baas

## 2021-04-23 ENCOUNTER — Other Ambulatory Visit: Payer: Self-pay

## 2021-04-23 ENCOUNTER — Encounter: Payer: Self-pay | Admitting: Family Medicine

## 2021-04-23 ENCOUNTER — Ambulatory Visit (INDEPENDENT_AMBULATORY_CARE_PROVIDER_SITE_OTHER): Payer: PPO | Admitting: Family Medicine

## 2021-04-23 VITALS — BP 138/83 | HR 79 | Temp 98.3°F | Ht 62.0 in | Wt 115.1 lb

## 2021-04-23 DIAGNOSIS — E876 Hypokalemia: Secondary | ICD-10-CM | POA: Diagnosis not present

## 2021-04-23 DIAGNOSIS — Z09 Encounter for follow-up examination after completed treatment for conditions other than malignant neoplasm: Secondary | ICD-10-CM | POA: Diagnosis not present

## 2021-04-23 DIAGNOSIS — Z720 Tobacco use: Secondary | ICD-10-CM

## 2021-04-23 DIAGNOSIS — D72829 Elevated white blood cell count, unspecified: Secondary | ICD-10-CM | POA: Insufficient documentation

## 2021-04-23 DIAGNOSIS — Z23 Encounter for immunization: Secondary | ICD-10-CM | POA: Diagnosis not present

## 2021-04-23 DIAGNOSIS — R5382 Chronic fatigue, unspecified: Secondary | ICD-10-CM | POA: Diagnosis not present

## 2021-04-23 DIAGNOSIS — F1021 Alcohol dependence, in remission: Secondary | ICD-10-CM

## 2021-04-23 DIAGNOSIS — B370 Candidal stomatitis: Secondary | ICD-10-CM | POA: Insufficient documentation

## 2021-04-23 MED ORDER — POTASSIUM CHLORIDE CRYS ER 10 MEQ PO TBCR: 20.0000 meq | EXTENDED_RELEASE_TABLET | Freq: Two times a day (BID) | ORAL | 0 refills | Status: DC

## 2021-04-23 MED ORDER — FLUCONAZOLE 150 MG PO TABS: ORAL_TABLET | ORAL | 0 refills | Status: DC

## 2021-04-23 NOTE — Assessment & Plan Note (Signed)
Seen for medication rash Repeat labs Plan to start potassium supplement Will determine long term plan for diuretic- bp control with what potassium is on labs today

## 2021-04-23 NOTE — Assessment & Plan Note (Signed)
Seen by rheum and neuro Was recently started on stimulant by neuro which has since been stopped d/t rash Continue to monitor

## 2021-04-23 NOTE — Assessment & Plan Note (Signed)
No s/s of infection Rash has resolved Repeat lab work

## 2021-04-23 NOTE — Assessment & Plan Note (Signed)
Off diuretic Recommend repeat blood work Supplement rx provided Balance act- for bp control/ without diuretic  Will need follow up to determine why potassium was so low and how much potassium she will need to resume diuretic  Suggest alternatives- pt wishes to continue supplement and hctz at this time

## 2021-04-23 NOTE — Assessment & Plan Note (Signed)
Previous quit with wellbutrin Does not wish to take more medication at this time Does not feel that she can quit

## 2021-04-23 NOTE — Assessment & Plan Note (Signed)
Sober for >5 years; congratulated Recommend d/c smoking for health concerns as well

## 2021-04-23 NOTE — Assessment & Plan Note (Signed)
Diflucan provided Encouraged to change toothbrush

## 2021-04-23 NOTE — Progress Notes (Signed)
04/24/2021 1:43 PM   April Best [birth year] [id number]  Referring provider: Virginia Crews, MD [address] [place],  [place] [address]  Chief Complaint  Patient presents with   Recurrent UTI     Urological history: 1. Left renal cyst -MRI 2021 2.4 cm simple renal cyst  2. rUTI's -contributing factors of age and vaginal atrophy -documented positive urine cultures over the last year  Pan-sensitive E.coli 12/26/2020  Proteus 03/22/2021 Proteus mirabilis     Chief Complaint  Patient presents with   Recurrent UTI    HPI: April Best is a 70 y.o. female who presents today for follow up after an UTI with her husband, April Best.   Her urine culture was positive for proteus mirabilis and was started on culture appropriate antibiotics.  Unfortunately, she had a severe allergic reaction to the nitrofurantoin, so she was not able to complete her course.     It was later determined that is was not due to the nitrofurantoin after she had a punch biopsy of the rash.    UA benign   She has been experiencing 8 or more daytime voids, 3 or more night time voids and a strong urge to urinate.  She is having incontinence with urge in the morning upon waking and at night time.  She is wearing a pad with depends at night.  She does not wear incontinence pads during the day.   She had not tried the Myrbetriq samples that were given to her a few months ago as she is hesitant to start another medication with her history of rash.   PMH: Past Medical History:  Diagnosis Date   Allergic rhinitis    Allergy    Annular oral lichen planus    Anxiety    Arthritis    Depression    Deviated nasal septum    nasal obstruction   Guaiac positive stools    history   H. 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   ABDOMINOPLASTY     APPENDECTOMY     BREAST BIOPSY Right    neg- core   COLONOSCOPY  03/2006   COLONOSCOPY WITH PROPOFOL N/A 11/19/2017   Procedure: COLONOSCOPY WITH PROPOFOL;  Surgeon: Toledo, Benay Pike, MD;  Location: [facility];  Service: Gastroenterology;  Laterality: N/A;   ENDOSCOPIC CONCHA BULLOSA RESECTION Right 01/30/2021   Procedure: ENDOSCOPIC CONCHA BULLOSA RESECTION;  Surgeon: Clyde Canterbury, MD;  Location: [place];  Service: ENT;  Laterality: Right;   RHINOPLASTY  2002   SEPTOPLASTY N/A 01/30/2021   Procedure: SEPTOPLASTY;  Surgeon: Clyde Canterbury, MD;  Location: [place];  Service: ENT;  Laterality: N/A;   UPPER GI ENDOSCOPY  2008    Home Medications:  Allergies as of 04/24/2021       Reactions   Enbrel [etanercept] Hives   Other reaction(s): Muscle Cramps   Modafinil Rash   Augmentin  [amoxicillin-pot Clavulanate] Nausea And Vomiting   as stated (XR).   Cefdinir Diarrhea,  Other (See Comments)   Other reaction(s): GI intolerance, Other (see comments) Gives her Cdiff Gives her Cdiff   Lisinopril Cough        Medication List        Accurate as of April 24, 2021  1:43 PM. If you have any questions, ask your nurse or doctor.          azelastine 0.1 % nasal spray Commonly known as: ASTELIN Place 1 spray into both nostrils daily as needed.   clobetasol ointment 0.05 % Commonly known as: TEMOVATE APP TOPICALLY TO GLUTEAL AREA ONCE D PRF DERMATITIS   COLLAGEN PO Take 30 mLs by mouth.   diclofenac Sodium 1 % Gel Commonly known as: VOLTAREN Apply 2 g topically 4 (four) times daily.   DULoxetine 20 MG capsule Commonly known as: CYMBALTA Take 40 mg by mouth daily.   escitalopram 20 MG  tablet Commonly known as: LEXAPRO TAKE 1 TABLET BY MOUTH DAILY   fluconazole 150 MG tablet Commonly known as: DIFLUCAN Take 2 tablets the first day Take 1 tablet until the prescription is completed   gabapentin 300 MG capsule Commonly known as: NEURONTIN gabapentin 300 mg capsule  1 at night   gabapentin 100 MG capsule Commonly known as: NEURONTIN Take 100 mg by mouth 2 (two) times daily as needed.   gentamicin ointment 0.1 % Commonly known as: GARAMYCIN Apply 1 application topically 3 (three) times daily.   Humira Pen 40 MG/0.4ML Pnkt Generic drug: Adalimumab every 14 (fourteen) days. Every other week   loratadine 10 MG tablet Commonly known as: CLARITIN Take 10 mg by mouth daily.   losartan 25 MG tablet Commonly known as: COZAAR Take 25 mg by mouth daily.   methocarbamol 750 MG tablet Commonly known as: ROBAXIN methocarbamol 750 mg tablet  Take 1 tablet 3 times a day by oral route for 30 days.   omeprazole 20 MG capsule Commonly known as: PRILOSEC Take 1 capsule (20 mg total) by mouth 2 (two) times daily before a meal.   ondansetron 4 MG tablet Commonly known as: ZOFRAN TAKE ONE TABLET BY MOUTH EVERY EIGHT HOURS AS NEEDED FOR NAUSEA / VOMITING   oxyCODONE-acetaminophen 10-325 MG tablet Commonly known as: PERCOCET Take 1 tablet by mouth every 4 (four) hours as needed for pain.   oxyCODONE-acetaminophen 10-325 MG tablet Commonly known as: Percocet One PO every 4-6 hours as needed for pain   potassium chloride 10 MEQ tablet Commonly known as: KLOR-CON Take 2 tablets (20 mEq total) by mouth 2 (two) times daily.   rosuvastatin 5 MG tablet Commonly known as: CRESTOR TAKE 1 TABLET(5 MG) BY MOUTH DAILY   traZODone 100 MG tablet Commonly known as: DESYREL Take 1 tablet (100 mg total) by mouth at bedtime.   triamcinolone cream 0.1 % Commonly known as: KENALOG Apply topically 2 (two) times daily as needed.   VITAMIN D3 PO Take 1,000 Int'l Units/day by  mouth.        Allergies:  Allergies  Allergen Reactions   Enbrel [Etanercept] Hives    Other reaction(s): Muscle Cramps   Modafinil Rash   Augmentin  [Amoxicillin-Pot Clavulanate] Nausea And Vomiting    as stated (XR).   Cefdinir Diarrhea and Other (See Comments)    Other reaction(s): GI intolerance, Other (see comments) Gives her Cdiff Gives her Cdiff    Lisinopril Cough    Family History: Family History  Problem Relation Age of Onset   Hypertension Mother    Hyperlipidemia Mother  Hypothyroidism Mother    Fibromyalgia Mother    Hearing loss Mother    Lung cancer Father    Coronary artery disease Father    Hyperlipidemia Father    Hypertension Father    Heart attack Father    Hearing loss Sister    Hyperlipidemia Sister    Hypertension Sister    Fibromyalgia Sister    Hemochromatosis Sister    ADD / ADHD Brother    Breast cancer Neg Hx     Social History:  reports that she has been smoking cigarettes. She has a 49.00 pack-year smoking history. She has never used smokeless tobacco. She reports that she does not currently use alcohol. She reports that she does not use drugs.  ROS: Pertinent ROS in HPI  Physical Exam: BP (!) 154/74   Pulse 82   Ht 5\' 2"  (1.575 m)   Wt 115 lb (52.2 kg)   BMI 21.03 kg/m   Constitutional:  Well nourished. Alert and oriented, No acute distress. HEENT: [place] AT, moist mucus membranes.  Trachea midline, no masses. Cardiovascular: No clubbing, cyanosis, or edema. Respiratory: Normal respiratory effort, no increased work of breathing. Neurologic: Grossly intact, no focal deficits, moving all 4 extremities. Psychiatric: Normal mood and affect.    Laboratory Data: Component     Latest Ref Rng & Units 04/24/2021  Specific Gravity, UA     1.005 - 1.030 1.020  pH, UA     5.0 - 7.5 6.0  Color, UA     Yellow Yellow  Appearance Ur     Clear Hazy (A)  Leukocytes,UA     Negative Negative  Protein,UA     Negative/Trace Negative   Glucose, UA     Negative Negative  Ketones, UA     Negative Negative  RBC, UA     Negative Negative  Bilirubin, UA     Negative Negative  Urobilinogen, Ur     0.2 - 1.0 mg/dL 0.2  Nitrite, UA     Negative Negative  Microscopic Examination      See below:   Component     Latest Ref Rng & Units 04/24/2021          WBC, UA     0 - 5 /hpf 0-5  RBC     0 - 2 /hpf None seen  Epithelial Cells (non renal)     0 - 10 /hpf 0-10  Casts     None seen /lpf Present (A)  Cast Type     N/A Hyaline casts  Bacteria, UA     None seen/Few None seen  I have reviewed the labs.   Pertinent Imaging: N/A   Assessment & Plan:    1. rUTI's -UA benign -Asymptomatic at today's visit  2.  Urge incontinence -She is not wanting to start a bladder medication at this time as she already has difficulty with dry mouth and she is had 2 emergency room visits for an allover body rash for recent medications that she has been prescribed -explained the PTNS provides treatment by indirectly providing electrical stimulation to the nerves responsible for bladder and pelvic floor function - a needle electrode generates an adjustable electrical pulse that travels to the sacral plexus via the tibial nerve which is located in the ankle, among other functions, the sacral nerve plexus regulates bladder and pelvic floor function - treatment protocol requires once-a-week treatments for 12 weeks, 30 minutes per session and many patients begin to see improvements by the  6th treatment. Patients who respond to treatment may require occasional treatments (~ once every 3 weeks) to sustain improvements. PTNS is a low-risk procedure. The most common side-effects with PTNS treatment are temporary and minor, resulting from the placement of the needle electrode. They include minor bleeding, mild pain and skin inflammation and patients have seen up to an 80% success rate with this form of treatment                   Return for  Pending PTNS prior authorization.  These notes generated with voice recognition software. I apologize for typographical errors.  Zara Council, PA-C  [facility] [address]  [place], [place] [address] [phone]   I spent 25 minutes on the day of the encounter to include pre-visit record review, face-to-face time with the patient, and post-visit ordering of tests.

## 2021-04-23 NOTE — Progress Notes (Signed)
Established patient visit   Patient: April Best   DOB: [birth year]   70 y.o. Female  MRN: [id number] Visit Date: 04/23/2021  Today's healthcare provider: Gwyneth Sprout, FNP   Started on a stimulant from neuro- caused all over rash requiring ER visit.  Subjective    HPI  Follow up Hospitalization  Patient was admitted to [facility] on 04/18/21 and discharged on 04/18/21. She was treated for allergic reaction. Treatment for this included continue steroids and benedryl. Telephone follow up was done on n/a She reports good compliance with treatment. She reports this condition is improved. -Patient has concerns about potassium levels after last labs taken on 04/17/21 ----------------------------------------------------------------------------------------- -    Medications: Outpatient Medications Prior to Visit  Medication Sig   azelastine (ASTELIN) 0.1 % nasal spray Place 1 spray into both nostrils daily as needed.   Cholecalciferol (VITAMIN D3 PO) Take 1,000 Int'l Units/day by mouth.   clobetasol ointment (TEMOVATE) 0.05 % APP TOPICALLY TO GLUTEAL AREA ONCE D PRF DERMATITIS   COLLAGEN PO Take 30 mLs by mouth.   diclofenac Sodium (VOLTAREN) 1 % GEL Apply 2 g topically 4 (four) times daily.   DULoxetine (CYMBALTA) 20 MG capsule Take 40 mg by mouth daily.   escitalopram (LEXAPRO) 20 MG tablet TAKE 1 TABLET BY MOUTH DAILY   gabapentin (NEURONTIN) 100 MG capsule Take 100 mg by mouth 2 (two) times daily as needed.   gabapentin (NEURONTIN) 300 MG capsule gabapentin 300 mg capsule  1 at night   gentamicin ointment (GARAMYCIN) 0.1 % Apply 1 application topically 3 (three) times daily.   HUMIRA PEN 40 MG/0.4ML PNKT every 14 (fourteen) days. Every other week   loratadine (CLARITIN) 10 MG tablet Take 10 mg by mouth daily.   losartan (COZAAR) 25 MG tablet Take 25 mg by mouth daily.   methocarbamol (ROBAXIN) 750 MG tablet methocarbamol 750 mg tablet  Take 1 tablet 3 times a day by oral  route for 30 days.   omeprazole (PRILOSEC) 20 MG capsule Take 1 capsule (20 mg total) by mouth 2 (two) times daily before a meal.   ondansetron (ZOFRAN) 4 MG tablet TAKE ONE TABLET BY MOUTH EVERY EIGHT HOURS AS NEEDED FOR NAUSEA / VOMITING   oxyCODONE-acetaminophen (PERCOCET) 10-325 MG tablet Take 1 tablet by mouth every 4 (four) hours as needed for pain.   oxyCODONE-acetaminophen (PERCOCET) 10-325 MG tablet One PO every 4-6 hours as needed for pain   rosuvastatin (CRESTOR) 5 MG tablet TAKE 1 TABLET(5 MG) BY MOUTH DAILY   traZODone (DESYREL) 100 MG tablet Take 1 tablet (100 mg total) by mouth at bedtime.   triamcinolone cream (KENALOG) 0.1 % Apply topically 2 (two) times daily as needed.   [DISCONTINUED] diphenhydrAMINE (BENADRYL) 25 MG tablet Take 1 tablet (25 mg total) by mouth every 6 (six) hours for 3 days.   [DISCONTINUED] hydrochlorothiazide (HYDRODIURIL) 25 MG tablet TAKE 1 TABLET BY MOUTH DAILY (Patient not taking: Reported on 04/23/2021)   [DISCONTINUED] meloxicam (MOBIC) 15 MG tablet Take 15 mg by mouth daily. (Patient not taking: Reported on 04/23/2021)   [DISCONTINUED] nitrofurantoin, macrocrystal-monohydrate, (MACROBID) 100 MG capsule Take 1 capsule (100 mg total) by mouth every 12 (twelve) hours. (Patient not taking: Reported on 04/23/2021)   No facility-administered medications prior to visit.    Review of Systems     Objective    BP 138/83 (BP Location: Right Arm, Patient Position: Sitting, Cuff Size: Normal)   Pulse 79   Temp 98.3 F (  36.8 C) (Oral)   Ht 5\' 2"  (1.575 m)   Wt 115 lb 1.6 oz (52.2 kg)   SpO2 98%   BMI 21.05 kg/m    Physical Exam Vitals and nursing note reviewed.  Constitutional:      General: She is not in acute distress.    Appearance: Normal appearance. She is normal weight. She is not ill-appearing, toxic-appearing or diaphoretic.  HENT:     Head: Normocephalic and atraumatic.  Cardiovascular:     Rate and Rhythm: Normal rate and regular rhythm.      Pulses: Normal pulses.     Heart sounds: Normal heart sounds. No murmur heard.   No friction rub. No gallop.  Pulmonary:     Effort: Pulmonary effort is normal. No respiratory distress.     Breath sounds: Normal breath sounds. No stridor. No wheezing, rhonchi or rales.  Chest:     Chest wall: No tenderness.  Abdominal:     General: Bowel sounds are normal.     Palpations: Abdomen is soft.  Musculoskeletal:        General: No swelling, tenderness, deformity or signs of injury.     Right lower leg: No edema.     Left lower leg: No edema.     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     Capillary Refill: Capillary refill takes less than 2 seconds.     Coloration: Skin is not jaundiced or pale.     Findings: No bruising, erythema, lesion or rash.     Comments: Rash has healed  Neurological:     General: No focal deficit present.     Mental Status: She is alert and oriented to person, place, and time. Mental status is at baseline.     Cranial Nerves: No cranial nerve deficit.     Sensory: No sensory deficit.     Motor: No weakness.     Coordination: Coordination normal.  Psychiatric:        Mood and Affect: Mood normal.        Behavior: Behavior normal.        Thought Content: Thought content normal.        Judgment: Judgment normal.      No results found for any visits on 04/23/21.  Assessment & Plan     Problem List Items Addressed This Visit       Digestive   Thrush    Diflucan provided Encouraged to change toothbrush      Relevant Medications   fluconazole (DIFLUCAN) 150 MG tablet     Other   Current tobacco use    Previous quit with wellbutrin Does not wish to take more medication at this time Does not feel that she can quit      Alcohol dependence, in remission ([place])    Sober for >5 years; congratulated Recommend d/c smoking for health concerns as well      Chronic fatigue    Seen by rheum and  neuro Was recently started on stimulant by neuro which has since been stopped d/t rash Continue to monitor      Relevant Orders   CBC with Differential/Platelet   Leukocytosis    No s/s of infection Rash has resolved Repeat lab work      Hypokalemia    Off diuretic Recommend repeat blood work Supplement rx provided Insurance underwriter act- for bp control/ without diuretic  Will need  follow up to determine why potassium was so low and how much potassium she will need to resume diuretic  Suggest alternatives- pt wishes to continue supplement and hctz at this time      Relevant Medications   potassium chloride (KLOR-CON) 10 MEQ tablet   Other Relevant Orders   Comprehensive metabolic panel   Hospital discharge follow-up - Primary    Seen for medication rash Repeat labs Plan to start potassium supplement Will determine long term plan for diuretic- bp control with what potassium is on labs today        Return in about 3 months (around 07/24/2021), or if symptoms worsen or fail to improve, for chonic disease management.      Vonna Kotyk, FNP, have reviewed all documentation for this visit. The documentation on 04/23/21 for the exam, diagnosis, procedures, and orders are all accurate and complete.    Gwyneth Sprout, [place] [phone] (phone) [phone] (fax)  [place]

## 2021-04-24 ENCOUNTER — Ambulatory Visit: Payer: PPO | Admitting: Urology

## 2021-04-24 ENCOUNTER — Other Ambulatory Visit: Payer: Self-pay

## 2021-04-24 ENCOUNTER — Encounter: Payer: Self-pay | Admitting: Urology

## 2021-04-24 VITALS — BP 154/74 | HR 82 | Ht 62.0 in | Wt 115.0 lb

## 2021-04-24 DIAGNOSIS — N3941 Urge incontinence: Secondary | ICD-10-CM

## 2021-04-24 DIAGNOSIS — M797 Fibromyalgia: Secondary | ICD-10-CM | POA: Insufficient documentation

## 2021-04-24 DIAGNOSIS — M539 Dorsopathy, unspecified: Secondary | ICD-10-CM | POA: Diagnosis not present

## 2021-04-24 DIAGNOSIS — L409 Psoriasis, unspecified: Secondary | ICD-10-CM | POA: Diagnosis not present

## 2021-04-24 DIAGNOSIS — N390 Urinary tract infection, site not specified: Secondary | ICD-10-CM | POA: Diagnosis not present

## 2021-04-24 DIAGNOSIS — Z7960 Long term (current) use of unspecified immunomodulators and immunosuppressants: Secondary | ICD-10-CM | POA: Diagnosis not present

## 2021-04-24 DIAGNOSIS — L4050 Arthropathic psoriasis, unspecified: Secondary | ICD-10-CM | POA: Diagnosis not present

## 2021-04-24 LAB — CBC WITH DIFFERENTIAL/PLATELET
Basophils Absolute: 0 10*3/uL (ref 0.0–0.2)
Basos: 0 %
EOS (ABSOLUTE): 0.7 10*3/uL — ABNORMAL HIGH (ref 0.0–0.4)
Eos: 4 %
Hematocrit: 42.3 % (ref 34.0–46.6)
Hemoglobin: 13.9 g/dL (ref 11.1–15.9)
Lymphocytes Absolute: 3.2 10*3/uL — ABNORMAL HIGH (ref 0.7–3.1)
Lymphs: 17 %
MCH: 31.4 pg (ref 26.6–33.0)
MCHC: 32.9 g/dL (ref 31.5–35.7)
MCV: 96 fL (ref 79–97)
Monocytes Absolute: 1.7 10*3/uL — ABNORMAL HIGH (ref 0.1–0.9)
Monocytes: 9 %
Neutrophils Absolute: 12.3 10*3/uL — ABNORMAL HIGH (ref 1.4–7.0)
Neutrophils: 66 %
Platelets: 599 10*3/uL — ABNORMAL HIGH (ref 150–450)
RBC: 4.43 x10E6/uL (ref 3.77–5.28)
RDW: 11.3 % — ABNORMAL LOW (ref 11.7–15.4)
WBC: 18.7 10*3/uL — ABNORMAL HIGH (ref 3.4–10.8)

## 2021-04-24 LAB — URINALYSIS, COMPLETE
Bilirubin, UA: NEGATIVE
Glucose, UA: NEGATIVE
Ketones, UA: NEGATIVE
Leukocytes,UA: NEGATIVE
Nitrite, UA: NEGATIVE
Protein,UA: NEGATIVE
RBC, UA: NEGATIVE
Specific Gravity, UA: 1.02 (ref 1.005–1.030)
Urobilinogen, Ur: 0.2 mg/dL (ref 0.2–1.0)
pH, UA: 6 (ref 5.0–7.5)

## 2021-04-24 LAB — IMMATURE CELLS: Metamyelocytes: 4 % — ABNORMAL HIGH (ref 0–0)

## 2021-04-24 LAB — COMPREHENSIVE METABOLIC PANEL
ALT: 15 IU/L (ref 0–32)
AST: 12 IU/L (ref 0–40)
Albumin/Globulin Ratio: 1.4 (ref 1.2–2.2)
Albumin: 3.6 g/dL — ABNORMAL LOW (ref 3.8–4.8)
Alkaline Phosphatase: 62 IU/L (ref 44–121)
BUN/Creatinine Ratio: 27 (ref 12–28)
BUN: 12 mg/dL (ref 8–27)
Bilirubin Total: 0.4 mg/dL (ref 0.0–1.2)
CO2: 25 mmol/L (ref 20–29)
Calcium: 8.7 mg/dL (ref 8.7–10.3)
Chloride: 101 mmol/L (ref 96–106)
Creatinine, Ser: 0.45 mg/dL — ABNORMAL LOW (ref 0.57–1.00)
Globulin, Total: 2.6 g/dL (ref 1.5–4.5)
Glucose: 95 mg/dL (ref 70–99)
Potassium: 4.2 mmol/L (ref 3.5–5.2)
Sodium: 142 mmol/L (ref 134–144)
Total Protein: 6.2 g/dL (ref 6.0–8.5)
eGFR: 104 mL/min/{1.73_m2} (ref >59)

## 2021-04-24 LAB — MICROSCOPIC EXAMINATION
Bacteria, UA: NONE SEEN
RBC, Urine: NONE SEEN /hpf (ref 0–2)

## 2021-04-27 ENCOUNTER — Telehealth: Payer: Self-pay

## 2021-04-27 NOTE — Telephone Encounter (Signed)
No PA required for PTNS. Called patient to set up appts, at this time she would like to discuss with her daughter and call back to schedule.

## 2021-04-27 NOTE — Telephone Encounter (Signed)
-----   Message from Nori Riis, PA-C sent at 04/24/2021  1:40 PM EST ----- Regarding: PTNS Would you check on insurance coverage for her?

## 2021-05-03 DIAGNOSIS — M5416 Radiculopathy, lumbar region: Secondary | ICD-10-CM | POA: Diagnosis not present

## 2021-05-07 DIAGNOSIS — J301 Allergic rhinitis due to pollen: Secondary | ICD-10-CM | POA: Diagnosis not present

## 2021-05-07 DIAGNOSIS — J019 Acute sinusitis, unspecified: Secondary | ICD-10-CM | POA: Diagnosis not present

## 2021-05-17 DIAGNOSIS — H2513 Age-related nuclear cataract, bilateral: Secondary | ICD-10-CM | POA: Diagnosis not present

## 2021-05-21 DIAGNOSIS — R201 Hypoesthesia of skin: Secondary | ICD-10-CM | POA: Diagnosis not present

## 2021-05-21 DIAGNOSIS — J019 Acute sinusitis, unspecified: Secondary | ICD-10-CM | POA: Diagnosis not present

## 2021-05-25 DIAGNOSIS — J019 Acute sinusitis, unspecified: Secondary | ICD-10-CM | POA: Diagnosis not present

## 2021-05-30 DIAGNOSIS — M5412 Radiculopathy, cervical region: Secondary | ICD-10-CM | POA: Diagnosis not present

## 2021-05-30 DIAGNOSIS — M810 Age-related osteoporosis without current pathological fracture: Secondary | ICD-10-CM | POA: Diagnosis not present

## 2021-05-30 DIAGNOSIS — M8438XA Stress fracture, other site, initial encounter for fracture: Secondary | ICD-10-CM | POA: Diagnosis not present

## 2021-05-30 DIAGNOSIS — Z79899 Other long term (current) drug therapy: Secondary | ICD-10-CM | POA: Diagnosis not present

## 2021-05-30 DIAGNOSIS — M5481 Occipital neuralgia: Secondary | ICD-10-CM | POA: Diagnosis not present

## 2021-05-30 DIAGNOSIS — M5416 Radiculopathy, lumbar region: Secondary | ICD-10-CM | POA: Diagnosis not present

## 2021-05-30 DIAGNOSIS — M25559 Pain in unspecified hip: Secondary | ICD-10-CM | POA: Diagnosis not present

## 2021-05-30 DIAGNOSIS — R110 Nausea: Secondary | ICD-10-CM | POA: Diagnosis not present

## 2021-05-30 DIAGNOSIS — G894 Chronic pain syndrome: Secondary | ICD-10-CM | POA: Diagnosis not present

## 2021-05-30 DIAGNOSIS — M5459 Other low back pain: Secondary | ICD-10-CM | POA: Diagnosis not present

## 2021-05-30 DIAGNOSIS — N281 Cyst of kidney, acquired: Secondary | ICD-10-CM | POA: Diagnosis not present

## 2021-06-13 ENCOUNTER — Other Ambulatory Visit: Payer: Self-pay

## 2021-06-13 ENCOUNTER — Telehealth: Payer: Self-pay | Admitting: Family Medicine

## 2021-06-13 DIAGNOSIS — E7800 Pure hypercholesterolemia, unspecified: Secondary | ICD-10-CM

## 2021-06-13 MED ORDER — ROSUVASTATIN CALCIUM 5 MG PO TABS: ORAL_TABLET | ORAL | 1 refills | Status: DC

## 2021-06-13 NOTE — Telephone Encounter (Signed)
[facility] faxed refill request for the following medications:  rosuvastatin (CRESTOR) 5 MG tablet   Please advise.

## 2021-06-27 ENCOUNTER — Other Ambulatory Visit: Payer: Self-pay | Admitting: *Deleted

## 2021-07-03 ENCOUNTER — Other Ambulatory Visit: Payer: Self-pay | Admitting: Family Medicine

## 2021-07-03 NOTE — Telephone Encounter (Signed)
Medication discontinued 04/23/21. Requested Prescriptions  Refused Prescriptions Disp Refills   hydrochlorothiazide (HYDRODIURIL) 25 MG tablet [Pharmacy Med Name: HYDROCHLOROTHIAZIDE 25 MG TAB] 90 tablet     Sig: TAKE 1 TABLET BY MOUTH DAILY     Cardiovascular: Diuretics - Thiazide Failed - 07/03/2021  1:39 PM      Failed - Cr in normal range and within 360 days    Creat  Date Value Ref Range Status  05/05/2017 0.57 0.50 - 0.99 mg/dL Final    Comment:    For patients >95 years of age, the reference limit for Creatinine is approximately 13% higher for people identified as African-American. .    Creatinine, Ser  Date Value Ref Range Status  04/23/2021 0.45 (L) 0.57 - 1.00 mg/dL Final         Failed - Last BP in normal range    BP Readings from Last 1 Encounters:  04/24/21 (!) 154/74         Passed - Ca in normal range and within 360 days    Calcium  Date Value Ref Range Status  04/23/2021 8.7 8.7 - 10.3 mg/dL Final         Passed - K in normal range and within 360 days    Potassium  Date Value Ref Range Status  04/23/2021 4.2 3.5 - 5.2 mmol/L Final         Passed - Na in normal range and within 360 days    Sodium  Date Value Ref Range Status  04/23/2021 142 134 - 144 mmol/L Final         Passed - Valid encounter within last 6 months    Recent Outpatient Visits          2 months ago Hospital discharge follow-up   [facility] Gwyneth Sprout, FNP   2 months ago Skin rash   [facility] Gwyneth Sprout, FNP   4 months ago Encounter for annual physical exam   [facility], Dionne Bucy, MD   8 months ago Pathological fracture of rib with routine healing, subsequent encounter   [facility], Laurita Quint, FNP   1 year ago C. difficile diarrhea   [facility], Wendee Beavers, [place]      Future Appointments            In 3 weeks Gwyneth Sprout, [place], [place]   In  1 month Bacigalupo, Dionne Bucy, MD [facility], [place]

## 2021-07-06 ENCOUNTER — Inpatient Hospital Stay: Payer: PPO | Attending: Internal Medicine

## 2021-07-11 ENCOUNTER — Other Ambulatory Visit: Payer: Self-pay

## 2021-07-11 ENCOUNTER — Inpatient Hospital Stay: Payer: PPO

## 2021-07-11 DIAGNOSIS — R110 Nausea: Secondary | ICD-10-CM | POA: Diagnosis not present

## 2021-07-11 DIAGNOSIS — M810 Age-related osteoporosis without current pathological fracture: Secondary | ICD-10-CM | POA: Diagnosis not present

## 2021-07-11 DIAGNOSIS — M5459 Other low back pain: Secondary | ICD-10-CM | POA: Diagnosis not present

## 2021-07-11 DIAGNOSIS — M5412 Radiculopathy, cervical region: Secondary | ICD-10-CM | POA: Diagnosis not present

## 2021-07-11 DIAGNOSIS — Z79891 Long term (current) use of opiate analgesic: Secondary | ICD-10-CM | POA: Diagnosis not present

## 2021-07-11 DIAGNOSIS — M5481 Occipital neuralgia: Secondary | ICD-10-CM | POA: Diagnosis not present

## 2021-07-11 DIAGNOSIS — Z79899 Other long term (current) drug therapy: Secondary | ICD-10-CM | POA: Diagnosis not present

## 2021-07-11 DIAGNOSIS — N281 Cyst of kidney, acquired: Secondary | ICD-10-CM | POA: Diagnosis not present

## 2021-07-11 DIAGNOSIS — M25559 Pain in unspecified hip: Secondary | ICD-10-CM | POA: Diagnosis not present

## 2021-07-11 DIAGNOSIS — Z5181 Encounter for therapeutic drug level monitoring: Secondary | ICD-10-CM | POA: Diagnosis not present

## 2021-07-11 DIAGNOSIS — G894 Chronic pain syndrome: Secondary | ICD-10-CM | POA: Diagnosis not present

## 2021-07-11 DIAGNOSIS — M8438XA Stress fracture, other site, initial encounter for fracture: Secondary | ICD-10-CM | POA: Diagnosis not present

## 2021-07-11 DIAGNOSIS — M5416 Radiculopathy, lumbar region: Secondary | ICD-10-CM | POA: Diagnosis not present

## 2021-07-11 LAB — CBC WITH DIFFERENTIAL/PLATELET
Abs Immature Granulocytes: 0.25 10*3/uL — ABNORMAL HIGH (ref 0.00–0.07)
Basophils Absolute: 0.1 10*3/uL (ref 0.0–0.1)
Basophils Relative: 1 %
Eosinophils Absolute: 0.1 10*3/uL (ref 0.0–0.5)
Eosinophils Relative: 1 %
HCT: 50 % — ABNORMAL HIGH (ref 36.0–46.0)
Hemoglobin: 16.2 g/dL — ABNORMAL HIGH (ref 12.0–15.0)
Immature Granulocytes: 2 %
Lymphocytes Relative: 13 %
Lymphs Abs: 1.7 10*3/uL (ref 0.7–4.0)
MCH: 32.9 pg (ref 26.0–34.0)
MCHC: 32.4 g/dL (ref 30.0–36.0)
MCV: 101.4 fL — ABNORMAL HIGH (ref 80.0–100.0)
Monocytes Absolute: 0.7 10*3/uL (ref 0.1–1.0)
Monocytes Relative: 5 %
Neutro Abs: 10.5 10*3/uL — ABNORMAL HIGH (ref 1.7–7.7)
Neutrophils Relative %: 78 %
Platelets: 320 10*3/uL (ref 150–400)
RBC: 4.93 MIL/uL (ref 3.87–5.11)
RDW: 15 % (ref 11.5–15.5)
WBC: 13.3 10*3/uL — ABNORMAL HIGH (ref 4.0–10.5)
nRBC: 0 % (ref 0.0–0.2)

## 2021-07-11 LAB — IRON AND TIBC
Iron: 159 ug/dL (ref 28–170)
Saturation Ratios: 51 % — ABNORMAL HIGH (ref 10.4–31.8)
TIBC: 314 ug/dL (ref 250–450)
UIBC: 155 ug/dL

## 2021-07-11 LAB — FERRITIN: Ferritin: 128 ng/mL (ref 11–307)

## 2021-07-13 ENCOUNTER — Inpatient Hospital Stay: Payer: PPO

## 2021-07-13 ENCOUNTER — Inpatient Hospital Stay: Payer: PPO | Admitting: Internal Medicine

## 2021-07-18 ENCOUNTER — Inpatient Hospital Stay: Payer: PPO

## 2021-07-18 ENCOUNTER — Inpatient Hospital Stay: Payer: PPO | Admitting: Internal Medicine

## 2021-07-23 NOTE — Progress Notes (Deleted)
°  ° ° °  Established patient visit   Patient: April Best   DOB: 04-18-1951   71 y.o. Female  MRN: [id number] Visit Date: 07/24/2021  Today's healthcare provider: Gwyneth Sprout, FNP   No chief complaint on file.  Subjective    HPI  ***  Medications: Outpatient Medications Prior to Visit  Medication Sig   azelastine (ASTELIN) 0.1 % nasal spray Place 1 spray into both nostrils daily as needed.   Cholecalciferol (VITAMIN D3 PO) Take 1,000 Int'l Units/day by mouth.   clobetasol ointment (TEMOVATE) 0.05 % APP TOPICALLY TO GLUTEAL AREA ONCE D PRF DERMATITIS   COLLAGEN PO Take 30 mLs by mouth.   diclofenac Sodium (VOLTAREN) 1 % GEL Apply 2 g topically 4 (four) times daily.   DULoxetine (CYMBALTA) 20 MG capsule Take 40 mg by mouth daily.   escitalopram (LEXAPRO) 20 MG tablet TAKE 1 TABLET BY MOUTH DAILY   fluconazole (DIFLUCAN) 150 MG tablet Take 2 tablets the first day Take 1 tablet until the prescription is completed   gabapentin (NEURONTIN) 100 MG capsule Take 100 mg by mouth 2 (two) times daily as needed.   gabapentin (NEURONTIN) 300 MG capsule gabapentin 300 mg capsule  1 at night   gentamicin ointment (GARAMYCIN) 0.1 % Apply 1 application topically 3 (three) times daily.   HUMIRA PEN 40 MG/0.4ML PNKT every 14 (fourteen) days. Every other week   loratadine (CLARITIN) 10 MG tablet Take 10 mg by mouth daily.   losartan (COZAAR) 25 MG tablet Take 25 mg by mouth daily.   methocarbamol (ROBAXIN) 750 MG tablet methocarbamol 750 mg tablet  Take 1 tablet 3 times a day by oral route for 30 days.   omeprazole (PRILOSEC) 20 MG capsule Take 1 capsule (20 mg total) by mouth 2 (two) times daily before a meal.   ondansetron (ZOFRAN) 4 MG tablet TAKE ONE TABLET BY MOUTH EVERY EIGHT HOURS AS NEEDED FOR NAUSEA / VOMITING   oxyCODONE-acetaminophen (PERCOCET) 10-325 MG tablet Take 1 tablet by mouth every 4 (four) hours as needed for pain.   oxyCODONE-acetaminophen (PERCOCET) 10-325 MG tablet One PO  every 4-6 hours as needed for pain   potassium chloride (KLOR-CON) 10 MEQ tablet Take 2 tablets (20 mEq total) by mouth 2 (two) times daily.   rosuvastatin (CRESTOR) 5 MG tablet TAKE 1 TABLET(5 MG) BY MOUTH DAILY   traZODone (DESYREL) 100 MG tablet Take 1 tablet (100 mg total) by mouth at bedtime.   triamcinolone cream (KENALOG) 0.1 % Apply topically 2 (two) times daily as needed.   No facility-administered medications prior to visit.    Review of Systems  {Labs   Heme   Chem   Endocrine   Serology   Results Review (optional):23779}   Objective    There were no vitals taken for this visit. {Show previous vital signs (optional):23777}  Physical Exam  ***  No results found for any visits on 07/24/21.  Assessment & Plan     ***  No follow-ups on file.      {provider attestation***:1}   Gwyneth Sprout, [place] [phone] (phone) [phone] (fax)  [place]

## 2021-07-24 ENCOUNTER — Ambulatory Visit: Payer: PPO | Admitting: Family Medicine

## 2021-07-30 ENCOUNTER — Other Ambulatory Visit: Payer: Self-pay

## 2021-07-30 ENCOUNTER — Inpatient Hospital Stay: Payer: PPO | Attending: Internal Medicine | Admitting: Internal Medicine

## 2021-07-30 ENCOUNTER — Encounter: Payer: Self-pay | Admitting: Internal Medicine

## 2021-07-30 ENCOUNTER — Inpatient Hospital Stay: Payer: PPO

## 2021-07-30 DIAGNOSIS — Z79899 Other long term (current) drug therapy: Secondary | ICD-10-CM | POA: Insufficient documentation

## 2021-07-30 DIAGNOSIS — F17210 Nicotine dependence, cigarettes, uncomplicated: Secondary | ICD-10-CM | POA: Insufficient documentation

## 2021-07-30 DIAGNOSIS — D751 Secondary polycythemia: Secondary | ICD-10-CM | POA: Diagnosis not present

## 2021-07-30 NOTE — Assessment & Plan Note (Addendum)
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  Patient continues to be asymptomatic; no evidence of organ dysfunction/overload. STABLE; phlebotomy today.  # Psoriasis arthritis-on? Enbrel-cost issue [Dr.Patel;[facility]; Rheum- STABLE.   #Mild erythrocytosis hematocrit 46-likely secondary to smoking; recommend smoking cessation.  # Active smoker- [facility]-10/22/2020 CT scan reviewed negative for any malignancy.  Again counseled to quit smoking.  DISPOSITION:  # Phlebotomy today # 6 months with labs [facility], iron studies, ferritin; 1 week prior, MD/ possible phlebotomy- Dr.B

## 2021-07-30 NOTE — Progress Notes (Signed)
[facility] OFFICE PROGRESS NOTE  Patient Care Team: Brita Romp, Dionne Bucy, MD as PCP - General (Family Medicine) Cammie Sickle, MD as Consulting Physician (Internal Medicine) Lonia Farber, MD as Consulting Physician (Internal Medicine) Allyson Sabal, NP as Nurse Practitioner (Pain Medicine) Pa, [place] (Optometry) Quintin Alto, MD as Consulting Physician (Rheumatology) Vladimir Crofts, MD as Consulting Physician (Neurology) [name], MD (Urology) Thornton Park, MD as Referring Physician (Orthopedic Surgery) Pilar Jarvis, MD as Referring Physician (Pain Medicine) Angola, Jimmy J, MD as Referring Physician (Physical Medicine and Rehabilitation) Clyde Canterbury, MD as Referring Physician (Otolaryngology)   SUMMARY OF HEMATOLOGIC/ONCOLOGIC HISTORY:  # April 2015- 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 Manuella Ghazi Cymbalta  INTERVAL HISTORY:  A very pleasant 71 year old female patient with above history of  [name] following screening  without any end organ dysfunction is here for follow-up /phlebotomy.   Denies any worsening weight loss or abdominal swelling or leg swelling.  Continues to complain of joint pains related to arthritis unable to afford Remicade because of insurance/cost issues.  Unfortunately she continues to smoke.  Review of Systems  Constitutional:  Positive for malaise/fatigue. Negative for chills, diaphoresis, fever and weight loss.  HENT:  Negative for nosebleeds and sore throat.   Eyes:  Negative for double vision.  Respiratory:  Negative for cough, hemoptysis, sputum production, shortness of breath and wheezing.    Cardiovascular:  Negative for chest pain, palpitations, orthopnea and leg swelling.  Gastrointestinal:  Negative for abdominal pain, blood in stool, constipation, diarrhea, heartburn, melena, nausea and vomiting.  Genitourinary:  Negative for dysuria, frequency and urgency.  Musculoskeletal:  Positive for back pain, joint pain, myalgias and neck pain.  Skin: Negative.  Negative for itching and rash.  Neurological:  Negative for dizziness, tingling, focal weakness, weakness and headaches.  Endo/Heme/Allergies:  Bruises/bleeds easily.  Psychiatric/Behavioral:  Negative for depression. The patient is not nervous/anxious and does not have insomnia.      PAST MEDICAL HISTORY :  Past Medical History:  Diagnosis Date   Allergic rhinitis    Allergy    Annular oral lichen planus    Anxiety    Arthritis    Depression    Deviated nasal septum    nasal obstruction   Guaiac positive stools    history   H. 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   History of palpitations    Hyperlipidemia    Hypertension    Migraine    Osteoarthritis    Osteoporosis    Panic disorder    Psoriasis    Psoriatic arthritis ([place])    Rheumatoid arteritis ([place])    Tinnitus     PAST SURGICAL HISTORY :   Past Surgical History:  Procedure Laterality Date   ABDOMINAL HYSTERECTOMY  1983   Ovaries have been removed.   ABDOMINOPLASTY     APPENDECTOMY     BREAST BIOPSY Right    neg- core   COLONOSCOPY  03/2006   COLONOSCOPY WITH PROPOFOL N/A 11/19/2017   Procedure: COLONOSCOPY WITH PROPOFOL;  Surgeon: Toledo, Benay Pike, MD;  Location: [facility];  Service: Gastroenterology;  Laterality: N/A;   ENDOSCOPIC CONCHA BULLOSA RESECTION Right 01/30/2021   Procedure: ENDOSCOPIC CONCHA BULLOSA RESECTION;  Surgeon: Clyde Canterbury, MD;  Location: [facility];  Service: ENT;  Laterality: Right;   RHINOPLASTY  2002   SEPTOPLASTY N/A 01/30/2021   Procedure: SEPTOPLASTY;   Surgeon: Clyde Canterbury, MD;  Location: [facility];  Service: ENT;  Laterality: N/A;   UPPER GI ENDOSCOPY  2008    FAMILY HISTORY :   Family History  Problem Relation Age of Onset   Hypertension Mother    Hyperlipidemia Mother    Hypothyroidism Mother    Fibromyalgia Mother    Hearing loss Mother    Lung cancer Father    Coronary artery disease Father    Hyperlipidemia Father    Hypertension Father    Heart attack Father    Hearing loss Sister    Hyperlipidemia Sister    Hypertension Sister    Fibromyalgia Sister    Hemochromatosis Sister    ADD / ADHD Brother    Breast cancer Neg Hx     SOCIAL HISTORY:   Social History   Tobacco Use   Smoking status: Every Day    Packs/day: 1.00    Years: 49.00    Pack years: 49.00    Types: Cigarettes   Smokeless tobacco: Never   Tobacco comments:    pt declines smoking cessation ( smoked since age 84)  Vaping Use   Vaping Use: Never used  Substance Use Topics   Alcohol use: Not Currently    Alcohol/week: 0.0 standard drinks   Drug use: No    ALLERGIES:  is allergic to enbrel [etanercept], modafinil, augmentin  [amoxicillin-pot clavulanate], cefdinir, and lisinopril.  MEDICATIONS:  Current Outpatient Medications  Medication Sig Dispense Refill   azelastine (ASTELIN) 0.1 % nasal spray Place 1 spray into both nostrils daily as needed.     Cholecalciferol (VITAMIN D3 PO) Take 1,000 Int'l Units/day by mouth.     clobetasol ointment (TEMOVATE) 0.05 % APP TOPICALLY TO GLUTEAL AREA ONCE D PRF DERMATITIS 30 g 1   COLLAGEN PO Take 30 mLs by mouth.     diclofenac Sodium (VOLTAREN) 1 % GEL Apply 2 g topically 4 (four) times daily.     DULoxetine (CYMBALTA) 20 MG capsule Take 40 mg by mouth daily.     escitalopram (LEXAPRO) 20 MG tablet TAKE 1 TABLET BY MOUTH DAILY 90 tablet 0   gabapentin (NEURONTIN) 100 MG capsule Take 100 mg by mouth 2 (two) times daily as needed.     gabapentin (NEURONTIN) 300 MG capsule gabapentin 300 mg  capsule  1 at night     gentamicin ointment (GARAMYCIN) 0.1 % Apply 1 application topically 3 (three) times daily.     HUMIRA PEN 40 MG/0.4ML PNKT every 14 (fourteen) days. Every other week     loratadine (CLARITIN) 10 MG tablet Take 10 mg by mouth daily.     losartan (COZAAR) 25 MG tablet Take 25 mg by mouth daily.     methocarbamol (ROBAXIN) 750 MG tablet methocarbamol 750 mg tablet  Take 1 tablet 3 times a day by oral route for 30 days.     omeprazole (PRILOSEC) 20 MG capsule Take 1 capsule (20 mg total) by mouth 2 (two) times daily before a meal. 60 capsule 2   ondansetron (ZOFRAN) 4 MG tablet TAKE ONE TABLET BY MOUTH EVERY EIGHT HOURS AS NEEDED FOR NAUSEA / VOMITING 30 tablet 0   oxyCODONE-acetaminophen (PERCOCET) 10-325 MG tablet Take 1 tablet by mouth every 4 (four) hours as needed for pain.     potassium chloride (  KLOR-CON) 10 MEQ tablet Take 2 tablets (20 mEq total) by mouth 2 (two) times daily. 360 tablet 0   predniSONE (DELTASONE) 10 MG tablet Take 1 tablet by mouth daily.     rosuvastatin (CRESTOR) 5 MG tablet TAKE 1 TABLET(5 MG) BY MOUTH DAILY 90 tablet 1   traZODone (DESYREL) 100 MG tablet Take 1 tablet (100 mg total) by mouth at bedtime. 90 tablet 1   triamcinolone cream (KENALOG) 0.1 % Apply topically 2 (two) times daily as needed.     No current facility-administered medications for this visit.    PHYSICAL EXAMINATION: ECOG PERFORMANCE STATUS: 0 - Asymptomatic  BP 135/83 (BP Location: Right Arm, Patient Position: Sitting, Cuff Size: Normal)    Pulse 98    Temp 98.4 F (36.9 C) (Temporal)    Ht 5\' 2"  (1.575 m)    Wt 112 lb 6.4 oz (51 kg)    SpO2 95%    BMI 20.56 kg/m   Filed Weights   07/30/21 1520  Weight: 112 lb 6.4 oz (51 kg)    Physical Exam Constitutional:      Comments: Ambulating independently.  Alone.  HENT:     Head: Normocephalic and atraumatic.     Mouth/Throat:     Pharynx: No oropharyngeal exudate.  Eyes:     Pupils: Pupils are equal, round, and  reactive to light.  Cardiovascular:     Rate and Rhythm: Normal rate and regular rhythm.  Pulmonary:     Effort: Pulmonary effort is normal. No respiratory distress.     Breath sounds: Normal breath sounds. No wheezing.  Abdominal:     General: Bowel sounds are normal. There is no distension.     Palpations: Abdomen is soft. There is no mass.     Tenderness: There is no abdominal tenderness. There is no guarding or rebound.  Musculoskeletal:        General: No tenderness. Normal range of motion.     Cervical back: Normal range of motion and neck supple.  Skin:    General: Skin is warm.  Neurological:     Mental Status: She is alert and oriented to person, place, and time.  Psychiatric:        Mood and Affect: Affect normal.     LABORATORY DATA:  I have reviewed the data as listed    Component Value Date/Time   NA 142 04/23/2021 0917   K 4.2 04/23/2021 0917   CL 101 04/23/2021 0917   CO2 25 04/23/2021 0917   GLUCOSE 95 04/23/2021 0917   GLUCOSE 109 (H) 04/17/2021 1614   BUN 12 04/23/2021 0917   CREATININE 0.45 (L) 04/23/2021 0917   CREATININE 0.57 05/05/2017 1653   CALCIUM 8.7 04/23/2021 0917   PROT 6.2 04/23/2021 0917   ALBUMIN 3.6 (L) 04/23/2021 0917   AST 12 04/23/2021 0917   ALT 15 04/23/2021 0917   ALKPHOS 62 04/23/2021 0917   BILITOT 0.4 04/23/2021 0917   GFRNONAA >60 04/17/2021 1614   GFRNONAA 97 05/05/2017 1653   GFRAA 83 01/20/2020 1327   GFRAA 113 05/05/2017 1653    No results found for: SPEP, UPEP  Lab Results  Component Value Date   WBC 13.3 (H) 07/11/2021   NEUTROABS 10.5 (H) 07/11/2021   HGB 16.2 (H) 07/11/2021   HCT 50.0 (H) 07/11/2021   MCV 101.4 (H) 07/11/2021   PLT 320 07/11/2021      Chemistry      Component Value Date/Time   NA 142 04/23/2021  0917   K 4.2 04/23/2021 0917   CL 101 04/23/2021 0917   CO2 25 04/23/2021 0917   BUN 12 04/23/2021 0917   CREATININE 0.45 (L) 04/23/2021 0917   CREATININE 0.57 05/05/2017 1653   GLU 102  09/05/2014 0000      Component Value Date/Time   CALCIUM 8.7 04/23/2021 0917   ALKPHOS 62 04/23/2021 0917   AST 12 04/23/2021 0917   ALT 15 04/23/2021 0917   BILITOT 0.4 04/23/2021 0917       ASSESSMENT & PLAN:   [name] ([place]) # HEREDITARY  Hemochromatosis/ compound heterozygous C282Y & H63D- [> ferritin:150; Iron sa-50%]-July  iron saturations 51%; ferritin 126.  Patient continues to be asymptomatic; no evidence of organ dysfunction/overload. STABLE; phlebotomy today.  # Psoriasis arthritis-on? Enbrel-cost issue [Dr.Patel;KC; Rheum- STABLE.   #Mild erythrocytosis hematocrit 46-likely secondary to smoking; recommend smoking cessation.  # Active smoker- [facility]-10/22/2020 CT scan reviewed negative for any malignancy.  Again counseled to quit smoking.  DISPOSITION:  # Phlebotomy today # 6 months with labs [facility], iron studies, ferritin; 1 week prior, MD/ possible phlebotomy- Dr.B      Cammie Sickle, MD 07/31/2021 9:29 AM

## 2021-07-30 NOTE — Patient Instructions (Signed)

## 2021-07-30 NOTE — Progress Notes (Signed)
Performed therapeutic phlebotomy. Removed 350 ml of blood. Pt tolerated procedure well. Accepted a beverage. Feeling well at time of discharge. VSS.

## 2021-07-31 ENCOUNTER — Encounter: Payer: Self-pay | Admitting: Internal Medicine

## 2021-08-03 ENCOUNTER — Other Ambulatory Visit: Payer: Self-pay | Admitting: Family Medicine

## 2021-08-03 DIAGNOSIS — F410 Panic disorder [episodic paroxysmal anxiety] without agoraphobia: Secondary | ICD-10-CM

## 2021-08-03 MED ORDER — ESCITALOPRAM OXALATE 20 MG PO TABS: 20.0000 mg | ORAL_TABLET | Freq: Every day | ORAL | 0 refills | Status: DC

## 2021-08-07 DIAGNOSIS — M8438XA Stress fracture, other site, initial encounter for fracture: Secondary | ICD-10-CM | POA: Diagnosis not present

## 2021-08-07 DIAGNOSIS — M5416 Radiculopathy, lumbar region: Secondary | ICD-10-CM | POA: Diagnosis not present

## 2021-08-07 DIAGNOSIS — G894 Chronic pain syndrome: Secondary | ICD-10-CM | POA: Diagnosis not present

## 2021-08-07 DIAGNOSIS — R110 Nausea: Secondary | ICD-10-CM | POA: Diagnosis not present

## 2021-08-07 DIAGNOSIS — M5412 Radiculopathy, cervical region: Secondary | ICD-10-CM | POA: Diagnosis not present

## 2021-08-07 DIAGNOSIS — Z79899 Other long term (current) drug therapy: Secondary | ICD-10-CM | POA: Diagnosis not present

## 2021-08-07 DIAGNOSIS — M5481 Occipital neuralgia: Secondary | ICD-10-CM | POA: Diagnosis not present

## 2021-08-07 DIAGNOSIS — M25559 Pain in unspecified hip: Secondary | ICD-10-CM | POA: Diagnosis not present

## 2021-08-07 DIAGNOSIS — M810 Age-related osteoporosis without current pathological fracture: Secondary | ICD-10-CM | POA: Diagnosis not present

## 2021-08-07 DIAGNOSIS — N281 Cyst of kidney, acquired: Secondary | ICD-10-CM | POA: Diagnosis not present

## 2021-08-09 ENCOUNTER — Ambulatory Visit (INDEPENDENT_AMBULATORY_CARE_PROVIDER_SITE_OTHER): Payer: PPO | Admitting: Family Medicine

## 2021-08-09 ENCOUNTER — Other Ambulatory Visit: Payer: Self-pay

## 2021-08-09 ENCOUNTER — Encounter: Payer: Self-pay | Admitting: Family Medicine

## 2021-08-09 VITALS — BP 118/70 | HR 108 | Temp 98.1°F | Resp 14 | Ht 61.0 in | Wt 110.4 lb

## 2021-08-09 DIAGNOSIS — L4050 Arthropathic psoriasis, unspecified: Secondary | ICD-10-CM | POA: Diagnosis not present

## 2021-08-09 DIAGNOSIS — D692 Other nonthrombocytopenic purpura: Secondary | ICD-10-CM | POA: Diagnosis not present

## 2021-08-09 DIAGNOSIS — Z720 Tobacco use: Secondary | ICD-10-CM | POA: Diagnosis not present

## 2021-08-09 DIAGNOSIS — I739 Peripheral vascular disease, unspecified: Secondary | ICD-10-CM | POA: Insufficient documentation

## 2021-08-09 DIAGNOSIS — Z5181 Encounter for therapeutic drug level monitoring: Secondary | ICD-10-CM | POA: Diagnosis not present

## 2021-08-09 DIAGNOSIS — E7800 Pure hypercholesterolemia, unspecified: Secondary | ICD-10-CM | POA: Diagnosis not present

## 2021-08-09 DIAGNOSIS — J439 Emphysema, unspecified: Secondary | ICD-10-CM

## 2021-08-09 DIAGNOSIS — R200 Anesthesia of skin: Secondary | ICD-10-CM | POA: Diagnosis not present

## 2021-08-09 DIAGNOSIS — F1021 Alcohol dependence, in remission: Secondary | ICD-10-CM

## 2021-08-09 DIAGNOSIS — Z79891 Long term (current) use of opiate analgesic: Secondary | ICD-10-CM | POA: Diagnosis not present

## 2021-08-09 DIAGNOSIS — I700 Atherosclerosis of aorta: Secondary | ICD-10-CM | POA: Diagnosis not present

## 2021-08-09 DIAGNOSIS — F3342 Major depressive disorder, recurrent, in full remission: Secondary | ICD-10-CM

## 2021-08-09 DIAGNOSIS — R7309 Other abnormal glucose: Secondary | ICD-10-CM

## 2021-08-09 DIAGNOSIS — I10 Essential (primary) hypertension: Secondary | ICD-10-CM | POA: Diagnosis not present

## 2021-08-09 MED ORDER — BUPROPION HCL ER (XL) 150 MG PO TB24: 150.0000 mg | ORAL_TABLET | Freq: Every day | ORAL | 2 refills | Status: DC

## 2021-08-09 NOTE — Assessment & Plan Note (Signed)
Chronic and stable Encourage tobacco cessation

## 2021-08-09 NOTE — Assessment & Plan Note (Signed)
Chronic and stable Continue lexapro at current dose 

## 2021-08-09 NOTE — Assessment & Plan Note (Signed)
Well controlled Continue current medications Recheck metabolic panel F/u in 6 months  

## 2021-08-09 NOTE — Assessment & Plan Note (Signed)
F/b rheum Difficulty getting DMARD approved Failed enbrel Was on humira

## 2021-08-09 NOTE — Assessment & Plan Note (Signed)
A1c back to normal at last visit Recheck today

## 2021-08-09 NOTE — Assessment & Plan Note (Signed)
stable Continue to monitor

## 2021-08-09 NOTE — Assessment & Plan Note (Signed)
Previously well controlled Continue statin Repeat FLP and CMP  

## 2021-08-09 NOTE — Progress Notes (Signed)
Established patient visit   Patient: April Best   DOB: Oct 24, 1950   71 y.o. Female  MRN: [id number] Visit Date: 08/09/2021  Today's healthcare provider: Lavon Paganini, MD   Chief Complaint  Patient presents with   Hyperlipidemia   Hypertension   Subjective    HPI  Hypertension, follow-up  BP Readings from Last 3 Encounters:  08/09/21 118/70  07/30/21 125/81  07/30/21 135/83   Wt Readings from Last 3 Encounters:  08/09/21 110 lb 6.4 oz (50.1 kg)  07/30/21 112 lb 6.4 oz (51 kg)  04/24/21 115 lb (52.2 kg)     She was last seen for hypertension 6 months ago.  BP at that visit was 109/72. Management since that visit includes; Well controlled, Continue current medications, Recheck metabolic panel.  She reports good compliance with treatment. She is not having side effects. None. She is following a Regular diet. She is not exercising. She does smoke.   Outside blood pressures are not checked at home.   Pertinent labs: Lab Results  Component Value Date   CHOL 251 (H) 02/05/2021   HDL 44 02/05/2021   LDLCALC 156 (H) 02/05/2021   TRIG 273 (H) 02/05/2021   CHOLHDL 5.7 (H) 02/05/2021   Lab Results  Component Value Date   NA 142 04/23/2021   K 4.2 04/23/2021   CREATININE 0.45 (L) 04/23/2021   EGFR 104 04/23/2021   GLUCOSE 95 04/23/2021   TSH 0.937 04/05/2021     The 10-year ASCVD risk score (Arnett DK, et al., 2019) is: 17.9%   --------------------------------------------------------------------------------------------------- Lipid/Cholesterol, Follow-up  Last lipid panel Other pertinent labs  Lab Results  Component Value Date   CHOL 251 (H) 02/05/2021   HDL 44 02/05/2021   LDLCALC 156 (H) 02/05/2021   TRIG 273 (H) 02/05/2021   CHOLHDL 5.7 (H) 02/05/2021   Lab Results  Component Value Date   ALT 15 04/23/2021   AST 12 04/23/2021   PLT 320 07/11/2021   TSH 0.937 04/05/2021     She was last seen for this 6 months ago.  Management  since that visit includes; After reviewing lipid panel, resumed Crestor $RemoveBeforeD'5mg'ybovKNAVuJnOyf$  daily. Discussed diet and exercise.   She reports good compliance with treatment. Current diet: in general, a "healthy" diet   Current exercise: none  The 10-year ASCVD risk score (Arnett DK, et al., 2019) is: 17.9%  --------------------------------------------------------------------------------------------------- Prediabetes, Follow-up  Lab Results  Component Value Date   HGBA1C 5.6 02/05/2021   HGBA1C 5.8 (H) 02/11/2020   HGBA1C 5.5 01/15/2017   GLUCOSE 95 04/23/2021   GLUCOSE 109 (H) 04/17/2021   GLUCOSE 85 02/05/2021    Last seen for for this6 months ago.  Management since that visit includes; Recommended a low carb diet. Recheck A1c. Current symptoms include none and have been unchanged.  Prior visit with dietician: no Current diet: in general, a "healthy" diet   Current exercise: none  Pertinent Labs:    Component Value Date/Time   CHOL 251 (H) 02/05/2021 1441   TRIG 273 (H) 02/05/2021 1441   CHOLHDL 5.7 (H) 02/05/2021 1441   CREATININE 0.45 (L) 04/23/2021 0917   CREATININE 0.57 05/05/2017 1653    Wt Readings from Last 3 Encounters:  08/09/21 110 lb 6.4 oz (50.1 kg)  07/30/21 112 lb 6.4 oz (51 kg)  04/24/21 115 lb (52.2 kg)    -----------------------------------------------------------------------------------------  Follow up for Psoriatic arthritis ([place])  The patient was last seen for this 6 months ago. Changes made  at last visit include; F/b Rheum. Discussed her lab results previously. Failed Enbrel. Continue Humira.  She reports good compliance with treatment. She feels that condition is Worse. She is not having side effects. None.   Patient has not had a Humira shot since December, and states she is a lot worse without it. She is having trouble getting insurance to approve it.  ----------------------------------------------------------------------------------------- R facial  numbness in V2-V3 distribution x few months.  Medications: Outpatient Medications Prior to Visit  Medication Sig   azelastine (ASTELIN) 0.1 % nasal spray Place 1 spray into both nostrils daily as needed.   Cholecalciferol (VITAMIN D3 PO) Take 1,000 Int'l Units/day by mouth.   clobetasol ointment (TEMOVATE) 0.05 % APP TOPICALLY TO GLUTEAL AREA ONCE D PRF DERMATITIS   COLLAGEN PO Take 30 mLs by mouth.   diclofenac Sodium (VOLTAREN) 1 % GEL Apply 2 g topically 4 (four) times daily.   escitalopram (LEXAPRO) 20 MG tablet Take 1 tablet (20 mg total) by mouth daily.   gabapentin (NEURONTIN) 100 MG capsule Take 100 mg by mouth 2 (two) times daily as needed.   gabapentin (NEURONTIN) 300 MG capsule PRN   gentamicin ointment (GARAMYCIN) 0.1 % Apply 1 application topically 3 (three) times daily.   loratadine (CLARITIN) 10 MG tablet Take 10 mg by mouth daily.   losartan (COZAAR) 25 MG tablet Take 25 mg by mouth daily.   methocarbamol (ROBAXIN) 750 MG tablet methocarbamol 750 mg tablet  Take 1 tablet 3 times a day by oral route for 30 days.   omeprazole (PRILOSEC) 20 MG capsule Take 1 capsule (20 mg total) by mouth 2 (two) times daily before a meal.   ondansetron (ZOFRAN) 4 MG tablet TAKE ONE TABLET BY MOUTH EVERY EIGHT HOURS AS NEEDED FOR NAUSEA / VOMITING   oxyCODONE-acetaminophen (PERCOCET) 10-325 MG tablet Take 1 tablet by mouth every 4 (four) hours as needed for pain.   potassium chloride (KLOR-CON) 10 MEQ tablet Take 2 tablets (20 mEq total) by mouth 2 (two) times daily.   predniSONE (DELTASONE) 10 MG tablet Take 1 tablet by mouth daily.   rosuvastatin (CRESTOR) 5 MG tablet TAKE 1 TABLET(5 MG) BY MOUTH DAILY   triamcinolone cream (KENALOG) 0.1 % Apply topically 2 (two) times daily as needed.   HUMIRA PEN 40 MG/0.4ML PNKT every 14 (fourteen) days. Every other week (Patient not taking: Reported on 08/09/2021)   No facility-administered medications prior to visit.    Review of Systems  All other  systems reviewed and are negative.     Objective    BP 118/70 (BP Location: Left Arm, Cuff Size: Normal)    Pulse (!) 108    Temp 98.1 F (36.7 C) (Oral)    Resp 14    Ht $R'5\' 1"'DN$  (1.549 m)    Wt 110 lb 6.4 oz (50.1 kg)    SpO2 97%    BMI 20.86 kg/m    Physical Exam Vitals reviewed.  Constitutional:      General: She is not in acute distress.    Appearance: Normal appearance. She is well-developed. She is not diaphoretic.  HENT:     Head: Normocephalic and atraumatic.  Eyes:     General: No scleral icterus.    Conjunctiva/sclera: Conjunctivae normal.  Neck:     Thyroid: No thyromegaly.  Cardiovascular:     Rate and Rhythm: Normal rate and regular rhythm.     Pulses: Normal pulses.     Heart sounds: Normal heart sounds. No murmur heard.  Pulmonary:     Effort: Pulmonary effort is normal. No respiratory distress.     Breath sounds: Normal breath sounds. No wheezing, rhonchi or rales.  Musculoskeletal:     Cervical back: Neck supple.     Right lower leg: No edema.     Left lower leg: No edema.  Lymphadenopathy:     Cervical: No cervical adenopathy.  Skin:    General: Skin is warm and dry.     Findings: No rash.  Neurological:     Mental Status: She is alert and oriented to person, place, and time. Mental status is at baseline.  Psychiatric:        Mood and Affect: Mood normal.        Behavior: Behavior normal.      No results found for any visits on 08/09/21.  Assessment & Plan     Problem List Items Addressed This Visit       Cardiovascular and Mediastinum   Essential hypertension    Well controlled Continue current medications Recheck metabolic panel F/u in 6 months       Aortic atherosclerosis (HCC)    Continue statin and risk factor management      Senile purpura (HCC)    stable Continue to monitor        Respiratory   Pulmonary emphysema (HCC)    Chronic and stable Encourage tobacco cessation        Musculoskeletal and Integument    Psoriatic arthritis ([place])    F/b rheum Difficulty getting DMARD approved Failed enbrel Was on humira        Other   Hypercholesteremia - Primary    Previously well controlled Continue statin Repeat FLP and CMP      Relevant Orders   Comprehensive metabolic panel   Lipid panel   Current tobacco use    Continue to recommend cessation Agrees to resume wellbutrin 150 mg XL daily      Elevated glucose    A1c back to normal at last visit Recheck today      Relevant Orders   Hemoglobin A1c   Alcohol dependence, in remission ([place])    Sober >5 yrs      Right facial numbness    New problem x2-3 months Seems like trigeminal neuralgia in v2-v3 distribution No pain currently Upcoming appt with neuro      Recurrent major depressive disorder, in full remission (HCC)    Chronic and stable Continue lexapro at current dose      Relevant Medications   buPROPion (WELLBUTRIN XL) 150 MG 24 hr tablet     Return in about 6 months (around 02/06/2022) for CPE.      I, Lavon Paganini, MD, have reviewed all documentation for this visit. The documentation on 08/09/21 for the exam, diagnosis, procedures, and orders are all accurate and complete.   Aretta Stetzel, Dionne Bucy, MD, MPH [facility]

## 2021-08-09 NOTE — Assessment & Plan Note (Addendum)
New problem x2-3 months Seems like trigeminal neuralgia in v2-v3 distribution No pain currently Upcoming appt with neuro

## 2021-08-09 NOTE — Assessment & Plan Note (Addendum)
Continue to recommend cessation Agrees to resume wellbutrin 150 mg XL daily

## 2021-08-09 NOTE — Assessment & Plan Note (Signed)
Sober >5 yrs

## 2021-08-09 NOTE — Assessment & Plan Note (Signed)
Continue statin and risk factor management

## 2021-08-10 LAB — COMPREHENSIVE METABOLIC PANEL
ALT: 22 IU/L (ref 0–32)
AST: 15 IU/L (ref 0–40)
Albumin/Globulin Ratio: 2.2 (ref 1.2–2.2)
Albumin: 4.6 g/dL (ref 3.8–4.8)
Alkaline Phosphatase: 54 IU/L (ref 44–121)
BUN/Creatinine Ratio: 24 (ref 12–28)
BUN: 19 mg/dL (ref 8–27)
Bilirubin Total: 0.3 mg/dL (ref 0.0–1.2)
CO2: 24 mmol/L (ref 20–29)
Calcium: 10.1 mg/dL (ref 8.7–10.3)
Chloride: 103 mmol/L (ref 96–106)
Creatinine, Ser: 0.78 mg/dL (ref 0.57–1.00)
Globulin, Total: 2.1 g/dL (ref 1.5–4.5)
Glucose: 112 mg/dL — ABNORMAL HIGH (ref 70–99)
Potassium: 4.2 mmol/L (ref 3.5–5.2)
Sodium: 146 mmol/L — ABNORMAL HIGH (ref 134–144)
Total Protein: 6.7 g/dL (ref 6.0–8.5)
eGFR: 82 mL/min/{1.73_m2} (ref >59)

## 2021-08-10 LAB — LIPID PANEL
Chol/HDL Ratio: 3.1 ratio (ref 0.0–4.4)
Cholesterol, Total: 230 mg/dL — ABNORMAL HIGH (ref 100–199)
HDL: 75 mg/dL (ref >39)
LDL Chol Calc (NIH): 134 mg/dL — ABNORMAL HIGH (ref 0–99)
Triglycerides: 122 mg/dL (ref 0–149)
VLDL Cholesterol Cal: 21 mg/dL (ref 5–40)

## 2021-08-10 LAB — HEMOGLOBIN A1C
Est. average glucose Bld gHb Est-mCnc: 126 mg/dL
Hgb A1c MFr Bld: 6 % — ABNORMAL HIGH (ref 4.8–5.6)

## 2021-08-22 DIAGNOSIS — Z7960 Long term (current) use of unspecified immunomodulators and immunosuppressants: Secondary | ICD-10-CM | POA: Diagnosis not present

## 2021-08-22 DIAGNOSIS — R110 Nausea: Secondary | ICD-10-CM | POA: Diagnosis not present

## 2021-08-22 DIAGNOSIS — M539 Dorsopathy, unspecified: Secondary | ICD-10-CM | POA: Diagnosis not present

## 2021-08-22 DIAGNOSIS — R768 Other specified abnormal immunological findings in serum: Secondary | ICD-10-CM | POA: Diagnosis not present

## 2021-08-22 DIAGNOSIS — M5416 Radiculopathy, lumbar region: Secondary | ICD-10-CM | POA: Diagnosis not present

## 2021-08-22 DIAGNOSIS — M797 Fibromyalgia: Secondary | ICD-10-CM | POA: Diagnosis not present

## 2021-08-22 DIAGNOSIS — L4050 Arthropathic psoriasis, unspecified: Secondary | ICD-10-CM | POA: Diagnosis not present

## 2021-08-22 DIAGNOSIS — L409 Psoriasis, unspecified: Secondary | ICD-10-CM | POA: Diagnosis not present

## 2021-08-23 DIAGNOSIS — L400 Psoriasis vulgaris: Secondary | ICD-10-CM | POA: Diagnosis not present

## 2021-08-23 DIAGNOSIS — L720 Epidermal cyst: Secondary | ICD-10-CM | POA: Diagnosis not present

## 2021-09-05 DIAGNOSIS — G894 Chronic pain syndrome: Secondary | ICD-10-CM | POA: Insufficient documentation

## 2021-09-05 DIAGNOSIS — M8448XA Pathological fracture, other site, initial encounter for fracture: Secondary | ICD-10-CM | POA: Insufficient documentation

## 2021-09-05 DIAGNOSIS — M810 Age-related osteoporosis without current pathological fracture: Secondary | ICD-10-CM | POA: Insufficient documentation

## 2021-09-05 DIAGNOSIS — M5416 Radiculopathy, lumbar region: Secondary | ICD-10-CM | POA: Diagnosis not present

## 2021-09-05 DIAGNOSIS — M5481 Occipital neuralgia: Secondary | ICD-10-CM | POA: Diagnosis not present

## 2021-09-05 DIAGNOSIS — M25559 Pain in unspecified hip: Secondary | ICD-10-CM | POA: Diagnosis not present

## 2021-09-05 DIAGNOSIS — R110 Nausea: Secondary | ICD-10-CM | POA: Insufficient documentation

## 2021-09-05 DIAGNOSIS — M5412 Radiculopathy, cervical region: Secondary | ICD-10-CM | POA: Diagnosis not present

## 2021-09-05 DIAGNOSIS — Z79899 Other long term (current) drug therapy: Secondary | ICD-10-CM | POA: Diagnosis not present

## 2021-09-05 DIAGNOSIS — M5459 Other low back pain: Secondary | ICD-10-CM | POA: Diagnosis not present

## 2021-09-05 DIAGNOSIS — M8438XA Stress fracture, other site, initial encounter for fracture: Secondary | ICD-10-CM | POA: Diagnosis not present

## 2021-09-05 DIAGNOSIS — N281 Cyst of kidney, acquired: Secondary | ICD-10-CM | POA: Diagnosis not present

## 2021-09-10 DIAGNOSIS — G5603 Carpal tunnel syndrome, bilateral upper limbs: Secondary | ICD-10-CM | POA: Diagnosis not present

## 2021-09-10 DIAGNOSIS — E559 Vitamin D deficiency, unspecified: Secondary | ICD-10-CM | POA: Diagnosis not present

## 2021-09-10 DIAGNOSIS — F419 Anxiety disorder, unspecified: Secondary | ICD-10-CM | POA: Diagnosis not present

## 2021-09-10 DIAGNOSIS — G4700 Insomnia, unspecified: Secondary | ICD-10-CM | POA: Diagnosis not present

## 2021-09-10 DIAGNOSIS — R2990 Unspecified symptoms and signs involving the nervous system: Secondary | ICD-10-CM | POA: Diagnosis not present

## 2021-09-10 DIAGNOSIS — R202 Paresthesia of skin: Secondary | ICD-10-CM | POA: Diagnosis not present

## 2021-09-10 DIAGNOSIS — L4050 Arthropathic psoriasis, unspecified: Secondary | ICD-10-CM | POA: Diagnosis not present

## 2021-09-12 ENCOUNTER — Other Ambulatory Visit: Payer: Self-pay | Admitting: Neurology

## 2021-09-12 DIAGNOSIS — R202 Paresthesia of skin: Secondary | ICD-10-CM

## 2021-09-13 ENCOUNTER — Other Ambulatory Visit: Payer: Self-pay | Admitting: Family Medicine

## 2021-09-13 ENCOUNTER — Other Ambulatory Visit: Payer: Self-pay | Admitting: *Deleted

## 2021-09-13 DIAGNOSIS — Z87891 Personal history of nicotine dependence: Secondary | ICD-10-CM

## 2021-09-13 DIAGNOSIS — F17210 Nicotine dependence, cigarettes, uncomplicated: Secondary | ICD-10-CM

## 2021-09-13 NOTE — Telephone Encounter (Signed)
Patient reports she has been taking HCTZ daily. Patient is requesting refill of trazodone HCl '100mg'$ . Patient reports she has been taking this every night for help with sleep. Patient reports taking 1/4 of tablet nightly.  ?

## 2021-09-14 MED ORDER — TRAZODONE HCL 100 MG PO TABS: 50.0000 mg | ORAL_TABLET | Freq: Every day | ORAL | 5 refills | Status: DC

## 2021-09-21 ENCOUNTER — Ambulatory Visit
Admission: RE | Admit: 2021-09-21 | Discharge: 2021-09-21 | Disposition: A | Discharge status: Home / Self Care | Payer: PPO | Source: Ambulatory Visit | Attending: Neurology | Admitting: Neurology

## 2021-09-21 DIAGNOSIS — I6381 Other cerebral infarction due to occlusion or stenosis of small artery: Secondary | ICD-10-CM | POA: Diagnosis not present

## 2021-09-21 DIAGNOSIS — R200 Anesthesia of skin: Secondary | ICD-10-CM | POA: Diagnosis not present

## 2021-09-21 DIAGNOSIS — J3489 Other specified disorders of nose and nasal sinuses: Secondary | ICD-10-CM | POA: Insufficient documentation

## 2021-09-21 DIAGNOSIS — R202 Paresthesia of skin: Secondary | ICD-10-CM | POA: Diagnosis not present

## 2021-09-21 DIAGNOSIS — I6782 Cerebral ischemia: Secondary | ICD-10-CM | POA: Insufficient documentation

## 2021-09-21 DIAGNOSIS — J329 Chronic sinusitis, unspecified: Secondary | ICD-10-CM | POA: Diagnosis not present

## 2021-09-21 MED ORDER — GADOBUTROL 1 MMOL/ML IV SOLN
5.0000 mL | Freq: Once | INTRAVENOUS | Status: AC | PRN
  Administered 2021-09-21: 5 mL via INTRAVENOUS

## 2021-09-24 ENCOUNTER — Ambulatory Visit (INDEPENDENT_AMBULATORY_CARE_PROVIDER_SITE_OTHER): Payer: PPO

## 2021-09-24 VITALS — Wt 110.0 lb

## 2021-09-24 DIAGNOSIS — Z0000 Encounter for general adult medical examination without abnormal findings: Secondary | ICD-10-CM

## 2021-09-24 DIAGNOSIS — Z1231 Encounter for screening mammogram for malignant neoplasm of breast: Secondary | ICD-10-CM

## 2021-09-24 NOTE — Progress Notes (Signed)
?Virtual Visit via Telephone Note ? ?I connected with  April Best on 09/24/21 at  2:15 PM EDT by telephone and verified that I am speaking with the correct person using two identifiers. ? ?Location: ?Patient: home ?Provider: [facility] ?Persons participating in the virtual visit: patient/Nurse Health Advisor ?  ?I discussed the limitations, risks, security and privacy concerns of performing an evaluation and management service by telephone and the availability of in person appointments. The patient expressed understanding and agreed to proceed. ? ?Interactive audio and video telecommunications were attempted between this nurse and patient, however failed, due to patient having technical difficulties OR patient did not have access to video capability.  We continued and completed visit with audio only. ? ?Some vital signs may be absent or patient reported.  ? ?Dionisio David, LPN ? ?Subjective:  ? April Best is a 71 y.o. female who presents for Medicare Annual (Subsequent) preventive examination. ? ?Review of Systems    ? ?  ? ?   ?Objective:  ?  ?There were no vitals filed for this visit. ?There is no height or weight on file to calculate BMI. ? ? ?  07/30/2021  ?  3:17 PM 04/17/2021  ?  4:07 PM 04/15/2021  ? 11:23 AM 01/30/2021  ?  9:49 AM 09/18/2020  ?  2:24 PM 09/13/2019  ?  2:14 PM 07/08/2019  ? 10:25 AM  ?Advanced Directives  ?Does Patient Have a Medical Advance Directive? Yes Yes Yes Yes Yes Yes Yes  ?Type of Paramedic of [place];Living will [place];Living will Living will;Healthcare Power of [place];Living will [place];Living will [place];Living will [place];Living will  ?Does patient want to make changes to medical advance directive?   No - Patient declined No - Guardian declined   No - Patient declined  ?Copy of [place] in Chart?   No - copy  requested No - copy requested No - copy requested No - copy requested No - copy requested  ? ? ?Current Medications (verified) ?Outpatient Encounter Medications as of 09/24/2021  ?Medication Sig  ? ondansetron (ZOFRAN) 4 MG tablet Take by mouth.  ? ALPRAZolam (XANAX) 0.5 MG tablet Xanax 0.5 mg tablet ? After consent is signed take 1 tabs. Repeat only after discussed with staff. Do not take with opioid. ? bring this medication and take it 40  min prior to the injection , do not drive  ? ALPRAZolam (XANAX) 0.5 MG tablet Take by mouth.  ? amoxicillin (AMOXIL) 875 MG tablet Take 875 mg by mouth 2 (two) times daily.  ? azelastine (ASTELIN) 0.1 % nasal spray Place 1 spray into both nostrils daily as needed.  ? buPROPion (WELLBUTRIN XL) 150 MG 24 hr tablet Take 1 tablet (150 mg total) by mouth daily.  ? cefdinir (OMNICEF) 300 MG capsule Take 300 mg by mouth 2 (two) times daily.  ? Cholecalciferol (VITAMIN D3 PO) Take 1,000 Int'l Units/day by mouth.  ? Cholecalciferol (VITAMIN D3) 10 MCG (400 UNIT) CAPS   ? clobetasol ointment (TEMOVATE) 0.05 % APP TOPICALLY TO GLUTEAL AREA ONCE D PRF DERMATITIS  ? COLLAGEN PO Take 30 mLs by mouth.  ? diclofenac Sodium (VOLTAREN) 1 % GEL Apply 2 g topically 4 (four) times daily.  ? doxycycline (VIBRAMYCIN) 100 MG capsule Take 100 mg by mouth 2 (two) times daily.  ? DULoxetine (CYMBALTA) 20 MG capsule Cymbalta 20 mg capsule,delayed  release ? Take 1 capsule twice a day by oral route.  ? DULoxetine (CYMBALTA) 20 MG capsule Take 40 mg by mouth daily.  ? escitalopram (LEXAPRO) 20 MG tablet Take 1 tablet (20 mg total) by mouth daily.  ? fluticasone (FLONASE) 50 MCG/ACT nasal spray Place 2 sprays into both nostrils daily.  ? gabapentin (NEURONTIN) 100 MG capsule Take 100 mg by mouth 2 (two) times daily as needed.  ? gabapentin (NEURONTIN) 300 MG capsule PRN  ? gentamicin ointment (GARAMYCIN) 0.1 % Apply 1 application topically 3 (three) times daily.  ? HUMIRA PEN 40 MG/0.4ML PNKT every 14  (fourteen) days. Every other week (Patient not taking: Reported on 08/09/2021)  ? hydrochlorothiazide (HYDRODIURIL) 25 MG tablet TAKE 1 TABLET BY MOUTH DAILY  ? hydrocortisone 2.5 % ointment Apply topically 2 (two) times daily.  ? loratadine (CLARITIN) 10 MG tablet Take 10 mg by mouth daily.  ? losartan (COZAAR) 25 MG tablet Take 25 mg by mouth daily.  ? methocarbamol (ROBAXIN) 750 MG tablet methocarbamol 750 mg tablet ? Take 1 tablet 3 times a day by oral route for 30 days.  ? modafinil (PROVIGIL) 100 MG tablet Take 100 mg by mouth every morning.  ? omeprazole (PRILOSEC) 20 MG capsule Take 1 capsule (20 mg total) by mouth 2 (two) times daily before a meal.  ? ondansetron (ZOFRAN) 4 MG tablet TAKE ONE TABLET BY MOUTH EVERY EIGHT HOURS AS NEEDED FOR NAUSEA / VOMITING  ? oxyCODONE-acetaminophen (PERCOCET) 10-325 MG tablet Take 1 tablet by mouth every 4 (four) hours as needed for pain.  ? potassium chloride (KLOR-CON) 10 MEQ tablet Take 2 tablets (20 mEq total) by mouth 2 (two) times daily.  ? potassium chloride (KLOR-CON) 10 MEQ tablet   ? predniSONE (DELTASONE) 10 MG tablet Take 1 tablet by mouth daily.  ? predniSONE (DELTASONE) 10 MG tablet prednisone 10 mg tablet ? Take 1 tablet every day by oral route.  ? predniSONE (DELTASONE) 5 MG tablet Take 5 mg by mouth daily.  ? rosuvastatin (CRESTOR) 5 MG tablet TAKE 1 TABLET(5 MG) BY MOUTH DAILY  ? sulfamethoxazole-trimethoprim (BACTRIM DS) 800-160 MG tablet Take 1 tablet by mouth 2 (two) times daily.  ? traZODone (DESYREL) 100 MG tablet Take 0.5-1 tablets (50-100 mg total) by mouth at bedtime.  ? traZODone (DESYREL) 100 MG tablet Take by mouth.  ? triamcinolone cream (KENALOG) 0.1 % Apply topically 2 (two) times daily as needed.  ? ?No facility-administered encounter medications on file as of 09/24/2021.  ? ? ?Allergies (verified) ?Enbrel [etanercept], Modafinil, Augmentin  [amoxicillin-pot clavulanate], Cefdinir, and Lisinopril  ? ?History: ?Past Medical History:   ?Diagnosis Date  ? Allergic rhinitis   ? Allergy   ? Annular oral lichen planus   ? Anxiety   ? Arthritis   ? Depression   ? Deviated nasal septum   ? nasal obstruction  ? Guaiac positive stools   ? history  ? H. pylori infection   ? H/O degenerative disc disease   ? Hemochromatosis 12/14/2014  ? History of palpitations   ? Hyperlipidemia   ? Hypertension   ? Migraine   ? Osteoarthritis   ? Osteoporosis   ? Panic disorder   ? Psoriasis   ? Psoriatic arthritis ([place])   ? Rheumatoid arteritis ([place])   ? Tinnitus   ? ?Past Surgical History:  ?Procedure Laterality Date  ? ABDOMINAL HYSTERECTOMY  1983  ? Ovaries have been removed.  ? ABDOMINOPLASTY    ? APPENDECTOMY    ?  BREAST BIOPSY Right   ? neg- core  ? COLONOSCOPY  03/2006  ? COLONOSCOPY WITH PROPOFOL N/A 11/19/2017  ? Procedure: COLONOSCOPY WITH PROPOFOL;  Surgeon: Toledo, Benay Pike, MD;  Location: [facility] END[facility]COPY;  Service: Gastroenterology;  Laterality: N/A;  ? ENDOSCOPIC CONCHA BULLOSA RESECTION Right 01/30/2021  ? Procedure: ENDOSCOPIC CONCHA BULLOSA RESECTION;  Surgeon: Clyde Canterbury, MD;  Location: [facility];  Service: ENT;  Laterality: Right;  ? RHINOPLASTY  2002  ? SEPTOPLASTY N/A 01/30/2021  ? Procedure: SEPTOPLASTY;  Surgeon: Clyde Canterbury, MD;  Location: [facility];  Service: ENT;  Laterality: N/A;  ? UPPER GI ENDOSCOPY  2008  ? ?Family History  ?Problem Relation Age of Onset  ? Hypertension Mother   ? Hyperlipidemia Mother   ? Hypothyroidism Mother   ? Fibromyalgia Mother   ? Hearing loss Mother   ? Lung cancer Father   ? Coronary artery disease Father   ? Hyperlipidemia Father   ? Hypertension Father   ? Heart attack Father   ? Hearing loss Sister   ? Hyperlipidemia Sister   ? Hypertension Sister   ? Fibromyalgia Sister   ? Hemochromatosis Sister   ? ADD / ADHD Brother   ? Breast cancer Neg Hx   ? ?Social History  ? ?Socioeconomic History  ? Marital status: Married  ?  Spouse name: Not on file  ? Number of children: 1  ? Years of  education: Not on file  ? Highest education level: High school graduate  ?Occupational History  ? Occupation: retired  ?Tobacco Use  ? Smoking status: Every Day  ?  Packs/day: 1.00  ?  Years: 49.00  ?  Pack years: 49.00  ?  Types: Ciga

## 2021-09-24 NOTE — Patient Instructions (Addendum)
Ms. Farone , ?Thank you for taking time to come for your Medicare Wellness Visit. I appreciate your ongoing commitment to your health goals. Please review the following plan we discussed and let me know if I can assist you in the future.  ? ?Screening recommendations/referrals: ?Colonoscopy: 11/19/17 ?Mammogram: 08/03/19, referral sent ?Bone Density: has appointment 5/10 ?Recommended yearly ophthalmology/optometry visit for glaucoma screening and checkup ?Recommended yearly dental visit for hygiene and checkup ? ?Vaccinations: ?Influenza vaccine: 04/23/21 ?Pneumococcal vaccine: 07/08/18 ?Tdap vaccine: 02/21/10, due ?Shingles vaccine: Zostavax 11/04/13   ?Covid-19:07/23/19, 08/20/19 ? ?Advanced directives: yes, requested copy ? ?Conditions/risks identified: none ? ?Next appointment: Follow up in one year for your annual wellness visit - 09/26/22 @ 1pm by phone ? ? ?Preventive Care 70 Years and Older, Female ?Preventive care refers to lifestyle choices and visits with your health care provider that can promote health and wellness. ?What does preventive care include? ?A yearly physical exam. This is also called an annual well check. ?Dental exams once or twice a year. ?Routine eye exams. Ask your health care provider how often you should have your eyes checked. ?Personal lifestyle choices, including: ?Daily care of your teeth and gums. ?Regular physical activity. ?Eating a healthy diet. ?Avoiding tobacco and drug use. ?Limiting alcohol use. ?Practicing safe sex. ?Taking low-dose aspirin every day. ?Taking vitamin and mineral supplements as recommended by your health care provider. ?What happens during an annual well check? ?The services and screenings done by your health care provider during your annual well check will depend on your age, overall health, lifestyle risk factors, and family history of disease. ?Counseling  ?Your health care provider may ask you questions about your: ?Alcohol use. ?Tobacco use. ?Drug use. ?Emotional  well-being. ?Home and relationship well-being. ?Sexual activity. ?Eating habits. ?History of falls. ?Memory and ability to understand (cognition). ?Work and work Statistician. ?Reproductive health. ?Screening  ?You may have the following tests or measurements: ?Height, weight, and BMI. ?Blood pressure. ?Lipid and cholesterol levels. These may be checked every 5 years, or more frequently if you are over 21 years old. ?Skin check. ?Lung cancer screening. You may have this screening every year starting at age 51 if you have a 30-pack-year history of smoking and currently smoke or have quit within the past 15 years. ?Fecal occult blood test (FOBT) of the stool. You may have this test every year starting at age 72. ?Flexible sigmoidoscopy or colonoscopy. You may have a sigmoidoscopy every 5 years or a colonoscopy every 10 years starting at age 28. ?Hepatitis C blood test. ?Hepatitis B blood test. ?Sexually transmitted disease (STD) testing. ?Diabetes screening. This is done by checking your blood sugar (glucose) after you have not eaten for a while (fasting). You may have this done every 1-3 years. ?Bone density scan. This is done to screen for osteoporosis. You may have this done starting at age 55. ?Mammogram. This may be done every 1-2 years. Talk to your health care provider about how often you should have regular mammograms. ?Talk with your health care provider about your test results, treatment options, and if necessary, the need for more tests. ?Vaccines  ?Your health care provider may recommend certain vaccines, such as: ?Influenza vaccine. This is recommended every year. ?Tetanus, diphtheria, and acellular pertussis (Tdap, Td) vaccine. You may need a Td booster every 10 years. ?Zoster vaccine. You may need this after age 73. ?Pneumococcal 13-valent conjugate (PCV13) vaccine. One dose is recommended after age 102. ?Pneumococcal polysaccharide (PPSV23) vaccine. One dose is recommended after  age 23. ?Talk to your  health care provider about which screenings and vaccines you need and how often you need them. ?This information is not intended to replace advice given to you by your health care provider. Make sure you discuss any questions you have with your health care provider. ?Document Released: 06/30/2015 Document Revised: 02/21/2016 Document Reviewed: 04/04/2015 ?Elsevier Interactive Patient Education ? 2017 [place]. ? ?Fall Prevention in the Home ?Falls can cause injuries. They can happen to people of all ages. There are many things you can do to make your home safe and to help prevent falls. ?What can I do on the outside of my home? ?Regularly fix the edges of walkways and driveways and fix any cracks. ?Remove anything that might make you trip as you walk through a door, such as a raised step or threshold. ?Trim any bushes or trees on the path to your home. ?Use bright outdoor lighting. ?Clear any walking paths of anything that might make someone trip, such as rocks or tools. ?Regularly check to see if handrails are loose or broken. Make sure that both sides of any steps have handrails. ?Any raised decks and porches should have guardrails on the edges. ?Have any leaves, snow, or ice cleared regularly. ?Use sand or salt on walking paths during winter. ?Clean up any spills in your garage right away. This includes oil or grease spills. ?What can I do in the bathroom? ?Use night lights. ?Install grab bars by the toilet and in the tub and shower. Do not use towel bars as grab bars. ?Use non-skid mats or decals in the tub or shower. ?If you need to sit down in the shower, use a plastic, non-slip stool. ?Keep the floor dry. Clean up any water that spills on the floor as soon as it happens. ?Remove soap buildup in the tub or shower regularly. ?Attach bath mats securely with double-sided non-slip rug tape. ?Do not have throw rugs and other things on the floor that can make you trip. ?What can I do in the bedroom? ?Use night  lights. ?Make sure that you have a light by your bed that is easy to reach. ?Do not use any sheets or blankets that are too big for your bed. They should not hang down onto the floor. ?Have a firm chair that has side arms. You can use this for support while you get dressed. ?Do not have throw rugs and other things on the floor that can make you trip. ?What can I do in the kitchen? ?Clean up any spills right away. ?Avoid walking on wet floors. ?Keep items that you use a lot in easy-to-reach places. ?If you need to reach something above you, use a strong step stool that has a grab bar. ?Keep electrical cords out of the way. ?Do not use floor polish or wax that makes floors slippery. If you must use wax, use non-skid floor wax. ?Do not have throw rugs and other things on the floor that can make you trip. ?What can I do with my stairs? ?Do not leave any items on the stairs. ?Make sure that there are handrails on both sides of the stairs and use them. Fix handrails that are broken or loose. Make sure that handrails are as long as the stairways. ?Check any carpeting to make sure that it is firmly attached to the stairs. Fix any carpet that is loose or worn. ?Avoid having throw rugs at the top or bottom of the stairs. If you do  have throw rugs, attach them to the floor with carpet tape. ?Make sure that you have a light switch at the top of the stairs and the bottom of the stairs. If you do not have them, ask someone to add them for you. ?What else can I do to help prevent falls? ?Wear shoes that: ?Do not have high heels. ?Have rubber bottoms. ?Are comfortable and fit you well. ?Are closed at the toe. Do not wear sandals. ?If you use a stepladder: ?Make sure that it is fully opened. Do not climb a closed stepladder. ?Make sure that both sides of the stepladder are locked into place. ?Ask someone to hold it for you, if possible. ?Clearly mark and make sure that you can see: ?Any grab bars or handrails. ?First and last  steps. ?Where the edge of each step is. ?Use tools that help you move around (mobility aids) if they are needed. These include: ?Canes. ?Walkers. ?Scooters. ?Crutches. ?Turn on the lights when you go into a dar

## 2021-09-25 ENCOUNTER — Ambulatory Visit: Payer: PPO | Attending: Family Medicine

## 2021-09-27 DIAGNOSIS — M791 Myalgia, unspecified site: Secondary | ICD-10-CM | POA: Diagnosis not present

## 2021-09-27 DIAGNOSIS — M5481 Occipital neuralgia: Secondary | ICD-10-CM | POA: Diagnosis not present

## 2021-10-02 ENCOUNTER — Encounter: Payer: Self-pay | Admitting: Family Medicine

## 2021-10-04 ENCOUNTER — Other Ambulatory Visit: Payer: Self-pay

## 2021-10-04 DIAGNOSIS — R7989 Other specified abnormal findings of blood chemistry: Secondary | ICD-10-CM

## 2021-10-04 NOTE — Telephone Encounter (Signed)
Please order a TSH and free T4. This is the first step in confirmation.  If she is taking biotin, she needs to hold this for 5 days before getting labs.  Next steps pending results. ?

## 2021-10-08 DIAGNOSIS — R7989 Other specified abnormal findings of blood chemistry: Secondary | ICD-10-CM | POA: Diagnosis not present

## 2021-10-08 DIAGNOSIS — E069 Thyroiditis, unspecified: Secondary | ICD-10-CM | POA: Diagnosis not present

## 2021-10-09 LAB — T4, FREE: Free T4: 1.5 ng/dL (ref 0.82–1.77)

## 2021-10-09 LAB — TSH: TSH: 0.453 u[IU]/mL (ref 0.450–4.500)

## 2021-10-11 ENCOUNTER — Other Ambulatory Visit: Payer: Self-pay | Admitting: Family Medicine

## 2021-10-14 ENCOUNTER — Encounter: Payer: Self-pay | Admitting: Family Medicine

## 2021-10-17 ENCOUNTER — Ambulatory Visit (INDEPENDENT_AMBULATORY_CARE_PROVIDER_SITE_OTHER): Payer: PPO | Admitting: Family Medicine

## 2021-10-17 ENCOUNTER — Encounter: Payer: Self-pay | Admitting: Family Medicine

## 2021-10-17 VITALS — BP 140/89 | HR 90 | Temp 97.9°F | Resp 16 | Ht 61.0 in | Wt 113.0 lb

## 2021-10-17 DIAGNOSIS — L439 Lichen planus, unspecified: Secondary | ICD-10-CM

## 2021-10-17 DIAGNOSIS — R062 Wheezing: Secondary | ICD-10-CM | POA: Insufficient documentation

## 2021-10-17 DIAGNOSIS — E0590 Thyrotoxicosis, unspecified without thyrotoxic crisis or storm: Secondary | ICD-10-CM

## 2021-10-17 DIAGNOSIS — J449 Chronic obstructive pulmonary disease, unspecified: Secondary | ICD-10-CM | POA: Diagnosis not present

## 2021-10-17 MED ORDER — BREZTRI AEROSPHERE 160-9-4.8 MCG/ACT IN AERO: 2.0000 | INHALATION_SPRAY | Freq: Two times a day (BID) | RESPIRATORY_TRACT | 11 refills | Status: DC

## 2021-10-17 MED ORDER — MAGIC MOUTHWASH: ORAL | 0 refills | Status: DC

## 2021-10-17 MED ORDER — METHYLPREDNISOLONE 4 MG PO TBPK: ORAL_TABLET | ORAL | 0 refills | Status: DC

## 2021-10-17 NOTE — Assessment & Plan Note (Signed)
Acute on chronic, stable ?Will write for Rx mouthwash as well as high dose steroid taper to assist ?Continue to recommend oral care and follow up with dentist if needed ?

## 2021-10-17 NOTE — Assessment & Plan Note (Signed)
Chronic, exacerbated ?Acute wheezing noted in R basilar lobe ?Sample of Breztri provided; with Rx going forward if patient desires ?Previously encouraged to reduce tobacco intake ?Plans to reschedule low dose CT chest for lung cancer screening  ?

## 2021-10-17 NOTE — Progress Notes (Signed)
?  ?I,Tiffany J Bragg,acting as a scribe for Gwyneth Sprout, FNP.,have documented all relevant documentation on the behalf of Gwyneth Sprout, FNP,as directed by  Gwyneth Sprout, FNP while in the presence of Gwyneth Sprout, FNP.  ? ?Established patient visit ? ? ?Patient: April Best   DOB: 27-Mar-1951   71 y.o. Female  MRN: [id number] ?Visit Date: 10/17/2021 ? ?Today's healthcare provider: Gwyneth Sprout, FNP  ?Re Introduced to nurse practitioner role and practice setting.  All questions answered.  Discussed provider/patient relationship and expectations. ? ? ?Chief Complaint  ?Patient presents with  ? Oral Pain  ? ?Subjective  ?  ?Oral Pain  ?This is a recurrent problem. The current episode started 1 to 4 weeks ago. The problem occurs constantly. The problem has been gradually worsening. The pain is at a severity of 10/10. The pain is moderate. Associated symptoms include difficulty swallowing, facial pain, sinus pressure and thermal sensitivity. Pertinent negatives include no fever or oral bleeding. She has tried ice for the symptoms. The treatment provided moderate relief.   ? ? ?Medications: ?Outpatient Medications Prior to Visit  ?Medication Sig  ? azelastine (ASTELIN) 0.1 % nasal spray Place 1 spray into both nostrils daily as needed.  ? buPROPion (WELLBUTRIN XL) 150 MG 24 hr tablet Take 1 tablet (150 mg total) by mouth daily.  ? clobetasol ointment (TEMOVATE) 0.05 % APP TOPICALLY TO GLUTEAL AREA ONCE D PRF DERMATITIS  ? COLLAGEN PO Take 30 mLs by mouth.  ? diclofenac Sodium (VOLTAREN) 1 % GEL Apply 2 g topically 4 (four) times daily.  ? DULoxetine (CYMBALTA) 20 MG capsule Cymbalta 20 mg capsule,delayed release ? Take 1 capsule twice a day by oral route.  ? escitalopram (LEXAPRO) 20 MG tablet Take 1 tablet (20 mg total) by mouth daily.  ? fluticasone (FLONASE) 50 MCG/ACT nasal spray Place 2 sprays into both nostrils daily.  ? gabapentin (NEURONTIN) 100 MG capsule Take 100 mg by mouth 2 (two) times daily as  needed.  ? gabapentin (NEURONTIN) 300 MG capsule PRN  ? gentamicin ointment (GARAMYCIN) 0.1 % Apply 1 application. topically 3 (three) times daily.  ? hydrochlorothiazide (HYDRODIURIL) 25 MG tablet TAKE 1 TABLET BY MOUTH DAILY  ? hydrocortisone 2.5 % ointment Apply topically 2 (two) times daily.  ? loratadine (CLARITIN) 10 MG tablet Take 10 mg by mouth daily.  ? losartan (COZAAR) 25 MG tablet Take 25 mg by mouth daily.  ? methocarbamol (ROBAXIN) 750 MG tablet methocarbamol 750 mg tablet ? Take 1 tablet 3 times a day by oral route for 30 days.  ? omeprazole (PRILOSEC) 20 MG capsule Take 1 capsule (20 mg total) by mouth 2 (two) times daily before a meal.  ? ondansetron (ZOFRAN) 4 MG tablet TAKE ONE TABLET BY MOUTH EVERY EIGHT HOURS AS NEEDED FOR NAUSEA / VOMITING  ? ondansetron (ZOFRAN) 4 MG tablet Take by mouth.  ? oxyCODONE-acetaminophen (PERCOCET) 10-325 MG tablet Take 1 tablet by mouth every 4 (four) hours as needed for pain.  ? potassium chloride (KLOR-CON) 10 MEQ tablet Take 2 tablets (20 mEq total) by mouth 2 (two) times daily.  ? potassium chloride (KLOR-CON) 10 MEQ tablet   ? predniSONE (DELTASONE) 10 MG tablet Take 1 tablet by mouth daily.  ? predniSONE (DELTASONE) 10 MG tablet prednisone 10 mg tablet ? Take 1 tablet every day by oral route.  ? rosuvastatin (CRESTOR) 5 MG tablet TAKE 1 TABLET(5 MG) BY MOUTH DAILY  ? traZODone (DESYREL) 100 MG  tablet Take 0.5-1 tablets (50-100 mg total) by mouth at bedtime.  ? traZODone (DESYREL) 100 MG tablet Take by mouth.  ? triamcinolone cream (KENALOG) 0.1 % Apply topically 2 (two) times daily as needed.  ? [DISCONTINUED] ALPRAZolam (XANAX) 0.5 MG tablet Xanax 0.5 mg tablet ? After consent is signed take 1 tabs. Repeat only after discussed with staff. Do not take with opioid. ? bring this medication and take it 40  min prior to the injection , do not drive (Patient not taking: Reported on 09/24/2021)  ? [DISCONTINUED] ALPRAZolam (XANAX) 0.5 MG tablet Take by mouth.  (Patient not taking: Reported on 09/24/2021)  ? [DISCONTINUED] amoxicillin (AMOXIL) 875 MG tablet Take 875 mg by mouth 2 (two) times daily. (Patient not taking: Reported on 09/24/2021)  ? [DISCONTINUED] cefdinir (OMNICEF) 300 MG capsule Take 300 mg by mouth 2 (two) times daily. (Patient not taking: Reported on 09/24/2021)  ? [DISCONTINUED] Cholecalciferol (VITAMIN D3 PO) Take 1,000 Int'l Units/day by mouth.  ? [DISCONTINUED] Cholecalciferol (VITAMIN D3) 10 MCG (400 UNIT) CAPS   ? [DISCONTINUED] doxycycline (VIBRAMYCIN) 100 MG capsule Take 100 mg by mouth 2 (two) times daily. (Patient not taking: Reported on 09/24/2021)  ? [DISCONTINUED] HUMIRA PEN 40 MG/0.4ML PNKT every 14 (fourteen) days. Every other week (Patient not taking: Reported on 08/09/2021)  ? [DISCONTINUED] modafinil (PROVIGIL) 100 MG tablet Take 100 mg by mouth every morning. (Patient not taking: Reported on 09/24/2021)  ? [DISCONTINUED] predniSONE (DELTASONE) 5 MG tablet Take 5 mg by mouth daily.  ? [DISCONTINUED] sulfamethoxazole-trimethoprim (BACTRIM DS) 800-160 MG tablet Take 1 tablet by mouth 2 (two) times daily. (Patient not taking: Reported on 09/24/2021)  ? ?No facility-administered medications prior to visit.  ? ? ?Review of Systems  ?Constitutional:  Negative for fever.  ?HENT:  Positive for sinus pressure.   ? ? ?  Objective  ?  ?BP 140/89 (BP Location: Right Arm, Patient Position: Sitting, Cuff Size: Normal)   Pulse 90   Temp 97.9 ?F (36.6 ?C)   Resp 16   Ht '5\' 1"'$  (1.549 m)   Wt 113 lb (51.3 kg)   SpO2 100%   BMI 21.35 kg/m?  ? ? ?Physical Exam  ? ? ?No results found for any visits on 10/17/21. ? Assessment & Plan  ?  ? ?Problem List Items Addressed This Visit   ? ?  ? Respiratory  ? Chronic obstructive pulmonary disease (HCC)  ?  Chronic, exacerbated ?Acute wheezing noted in R basilar lobe ?Sample of Breztri provided; with Rx going forward if patient desires ?Previously encouraged to reduce tobacco intake ?Plans to reschedule low dose CT  chest for lung cancer screening  ? ?  ?  ? Relevant Medications  ? methylPREDNISolone (MEDROL DOSEPAK) 4 MG TBPK tablet  ? magic mouthwash SOLN  ? Budeson-Glycopyrrol-Formoterol (BREZTRI AEROSPHERE) 160-9-4.8 MCG/ACT AERO  ?  ? Digestive  ? Lichen planus of tongue - Primary  ?  Acute on chronic, stable ?Will write for Rx mouthwash as well as high dose steroid taper to assist ?Continue to recommend oral care and follow up with dentist if needed ? ?  ?  ? Relevant Medications  ? methylPREDNISolone (MEDROL DOSEPAK) 4 MG TBPK tablet  ? magic mouthwash SOLN  ?  ? Endocrine  ? Hyperthyroidism  ?  Acute, improving, undiagnosed ?TSH was low in 3/23 at Neuro; improved in 4/23 with PCP ?Encouraged to wait 1 month following last labs for additional recheck ?Referral placed for further endocrine work up per pt  request ? ? ?  ?  ? Relevant Orders  ? Ambulatory referral to Endocrinology  ? ? ? ?Return in about 4 weeks (around 11/14/2021), or if symptoms worsen or fail to improve, for chonic disease management.  ?   ? ?I, Gwyneth Sprout, FNP, have reviewed all documentation for this visit. The documentation on 10/17/21 for the exam, diagnosis, procedures, and orders are all accurate and complete. ? ? ? ?Gwyneth Sprout, FNP  ?[place] ?[phone] (phone) ?[phone] (fax) ? ?[facility]  ?

## 2021-10-17 NOTE — Assessment & Plan Note (Signed)
Acute, improving, undiagnosed ?TSH was low in 3/23 at Neuro; improved in 4/23 with PCP ?Encouraged to wait 1 month following last labs for additional recheck ?Referral placed for further endocrine work up per pt request ? ?

## 2021-10-24 DIAGNOSIS — M810 Age-related osteoporosis without current pathological fracture: Secondary | ICD-10-CM | POA: Diagnosis not present

## 2021-10-31 DIAGNOSIS — M810 Age-related osteoporosis without current pathological fracture: Secondary | ICD-10-CM | POA: Diagnosis not present

## 2021-10-31 DIAGNOSIS — R7989 Other specified abnormal findings of blood chemistry: Secondary | ICD-10-CM | POA: Diagnosis not present

## 2021-11-03 ENCOUNTER — Other Ambulatory Visit: Payer: Self-pay

## 2021-11-03 ENCOUNTER — Ambulatory Visit
Admission: RE | Admit: 2021-11-03 | Discharge: 2021-11-03 | Disposition: A | Discharge status: Home / Self Care | Payer: PPO | Source: Ambulatory Visit | Attending: Emergency Medicine | Admitting: Emergency Medicine

## 2021-11-03 VITALS — BP 149/91 | HR 97 | Temp 98.1°F | Resp 18

## 2021-11-03 DIAGNOSIS — I10 Essential (primary) hypertension: Secondary | ICD-10-CM | POA: Diagnosis not present

## 2021-11-03 DIAGNOSIS — N390 Urinary tract infection, site not specified: Secondary | ICD-10-CM | POA: Diagnosis not present

## 2021-11-03 LAB — POCT URINALYSIS DIP (MANUAL ENTRY)
Blood, UA: NEGATIVE
Glucose, UA: NEGATIVE mg/dL
Nitrite, UA: POSITIVE — AB
Protein Ur, POC: 30 mg/dL — AB
Spec Grav, UA: 1.025 (ref 1.010–1.025)
Urobilinogen, UA: 0.2 E.U./dL
pH, UA: 5.5 (ref 5.0–8.0)

## 2021-11-03 MED ORDER — NITROFURANTOIN MONOHYD MACRO 100 MG PO CAPS
100.0000 mg | ORAL_CAPSULE | Freq: Two times a day (BID) | ORAL | 0 refills | Status: DC
Start: 2021-11-03 — End: 2022-02-04

## 2021-11-03 NOTE — ED Triage Notes (Signed)
Patient reports symptoms started on Wednesday.  Patient reports burning pain at end of urinary stream, lower back pain and legs hurting.  Legs are hurting, urinary incontinence, and loose stools

## 2021-11-03 NOTE — ED Provider Notes (Signed)
Roderic Palau    CSN: [id number] Arrival date & time: 11/03/21  1111      History   Chief Complaint Chief Complaint  Patient presents with   Urinary Frequency    UTI - Entered by patient   Urinary Tract Infection    HPI WINNI EHRHARD is a 71 y.o. female.  Patient presents with dysuria x3 days.  She also reports low back pain.  Her medical history includes frequent UTIs and urinary incontinence; she is followed by urology.  No fever, chills, abdominal pain, hematuria, flank pain, vaginal discharge, pelvic pain, or other symptoms.  No treatments at home.   The history is provided by the patient and medical records.   Past Medical History:  Diagnosis Date   Allergic rhinitis    Allergy    Annular oral lichen planus    Anxiety    Arthritis    Depression    Deviated nasal septum    nasal obstruction   Guaiac positive stools    history   H. 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   History of palpitations    Hyperlipidemia    Hypertension    Migraine    Osteoarthritis    Osteoporosis    Panic disorder    Psoriasis    Psoriatic arthritis ([place])    Rheumatoid arteritis ([place])    Tinnitus     Patient Active Problem List   Diagnosis Date Noted   Lichen planus of tongue 10/17/2021   Hyperthyroidism 10/17/2021   Wheezing 10/17/2021   Chronic obstructive pulmonary disease ([place]) 10/17/2021   Bilateral sacral insufficiency fracture 09/05/2021   Cervico-occipital neuralgia 09/05/2021   Hip pain 09/05/2021   Lumbar radiculopathy 09/05/2021   Nausea 09/05/2021   Osteoporosis 09/05/2021   Chronic pain syndrome 09/05/2021   Right facial numbness 08/09/2021   Aortic atherosclerosis ([place]) 08/09/2021   Recurrent major depressive disorder, in full remission ([place]) 08/09/2021   Senile purpura ([place]) 08/09/2021   Fibromyalgia 04/24/2021   Chronic fatigue 04/05/2021   Skin rash 04/05/2021   Hot flashes 04/05/2021    Pharyngoesophageal dysphagia 04/05/2021   Raised antibody titer 03/01/2020   Psoriatic arthritis ([place]) 01/25/2020   ANA positive 01/19/2020   Renal cyst 12/01/2019   Proteinuria 12/01/2019   Alcohol dependence, in remission ([place]) 09/14/2019   Low back pain 07/12/2019   Pulmonary emphysema ([place]) 07/24/2018   Elevated glucose 02/13/2015   [name] ([place]) 12/14/2014   Family history of hemochromatosis 11/02/2014   Hypercholesteremia 11/02/2014   Essential hypertension 11/02/2014   Current tobacco use 11/02/2014   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   ABDOMINOPLASTY     APPENDECTOMY     BREAST BIOPSY Right    neg- core   COLONOSCOPY  03/2006   COLONOSCOPY WITH PROPOFOL N/A 11/19/2017   Procedure: COLONOSCOPY WITH PROPOFOL;  Surgeon: Toledo, Benay Pike, MD;  Location: [facility];  Service: Gastroenterology;  Laterality: N/A;   ENDOSCOPIC CONCHA BULLOSA RESECTION Right 01/30/2021   Procedure: ENDOSCOPIC CONCHA BULLOSA RESECTION;  Surgeon: Clyde Canterbury, MD;  Location: [facility];  Service: ENT;  Laterality: Right;   RHINOPLASTY  2002   SEPTOPLASTY N/A 01/30/2021   Procedure: SEPTOPLASTY;  Surgeon: Clyde Canterbury, MD;  Location: [facility];  Service: ENT;  Laterality: N/A;   UPPER GI ENDOSCOPY  2008  OB History     Gravida  1   Para  1   Term      Preterm      AB      Living         SAB      IAB      Ectopic      Multiple      Live Births               Home Medications    Prior to Admission medications   Medication Sig Start Date End Date Taking? Authorizing Provider  nitrofurantoin, macrocrystal-monohydrate, (MACROBID) 100 MG capsule Take 1 capsule (100 mg total) by mouth 2 (two) times daily. 11/03/21  Yes Sharion Balloon, NP  azelastine (ASTELIN) 0.1 % nasal spray Place 1 spray into both  nostrils daily as needed.    [provider]  Budeson-Glycopyrrol-Formoterol (BREZTRI AEROSPHERE) 160-9-4.8 MCG/ACT AERO Inhale 2 puffs into the lungs 2 (two) times daily. 10/17/21   Gwyneth Sprout, FNP  buPROPion (WELLBUTRIN XL) 150 MG 24 hr tablet Take 1 tablet (150 mg total) by mouth daily. 08/09/21   Virginia Crews, MD  clobetasol ointment (TEMOVATE) 0.05 % APP TOPICALLY TO GLUTEAL AREA ONCE D PRF DERMATITIS 10/07/18   Trinna Post, PA-C  COLLAGEN PO Take 30 mLs by mouth.    [provider]  diclofenac Sodium (VOLTAREN) 1 % GEL Apply 2 g topically 4 (four) times daily. 09/07/20   [provider]  DULoxetine (CYMBALTA) 20 MG capsule Cymbalta 20 mg capsule,delayed release  Take 1 capsule twice a day by oral route.    [provider]  escitalopram (LEXAPRO) 20 MG tablet Take 1 tablet (20 mg total) by mouth daily. 08/03/21   Mecum, Erin E, PA-C  fluticasone (FLONASE) 50 MCG/ACT nasal spray Place 2 sprays into both nostrils daily. 05/07/21   [provider]  gabapentin (NEURONTIN) 100 MG capsule Take 100 mg by mouth 2 (two) times daily as needed. 11/05/19   [provider]  gabapentin (NEURONTIN) 300 MG capsule PRN    [provider]  gentamicin ointment (GARAMYCIN) 0.1 % Apply 1 application. topically 3 (three) times daily.    [provider]  hydrochlorothiazide (HYDRODIURIL) 25 MG tablet TAKE 1 TABLET BY MOUTH DAILY 09/13/21   Bacigalupo, Dionne Bucy, MD  hydrocortisone 2.5 % ointment Apply topically 2 (two) times daily. 03/29/21   [provider]  loratadine (CLARITIN) 10 MG tablet Take 10 mg by mouth daily.    [provider]  losartan (COZAAR) 25 MG tablet Take 25 mg by mouth daily. 12/31/19   [provider]  magic mouthwash SOLN Traditional magic mouthwash compound per pharmacy formulary. Take 15 mL, swish and swallow, up to 4 time/day Patient not taking: Reported on 11/03/2021 10/17/21   Gwyneth Sprout, FNP  methocarbamol (ROBAXIN) 750 MG tablet methocarbamol 750 mg tablet  Take 1 tablet 3 times a day by oral route for 30 days.    [provider]  methylPREDNISolone (MEDROL DOSEPAK) 4 MG TBPK tablet Take as prescribed on package. Patient not taking: Reported on 11/03/2021 10/17/21   Gwyneth Sprout, FNP  omeprazole (PRILOSEC) 20 MG capsule Take 1 capsule (20 mg total) by mouth 2 (two) times daily before a meal. 04/05/21   Gwyneth Sprout, FNP  ondansetron (ZOFRAN) 4 MG tablet TAKE ONE TABLET BY MOUTH EVERY EIGHT HOURS AS NEEDED FOR NAUSEA / VOMITING 04/07/20   Trinna Post,  PA-C  ondansetron (ZOFRAN) 4 MG tablet Take by mouth. 08/22/21   [provider]  oxyCODONE-acetaminophen (PERCOCET) 10-325 MG tablet Take 1 tablet by mouth every 4 (four) hours as needed for pain.    [provider]  potassium chloride (KLOR-CON) 10 MEQ tablet Take 2 tablets (20 mEq total) by mouth 2 (two) times daily. 04/23/21   Gwyneth Sprout, FNP  potassium chloride (KLOR-CON) 10 MEQ tablet  05/17/21   [provider]  predniSONE (DELTASONE) 10 MG tablet Take 1 tablet by mouth daily. 06/07/21   [provider]  predniSONE (DELTASONE) 10 MG tablet prednisone 10 mg tablet  Take 1 tablet every day by oral route.    [provider]  rosuvastatin (CRESTOR) 5 MG tablet TAKE 1 TABLET(5 MG) BY MOUTH DAILY 06/13/21   Tally Joe T, FNP  traZODone (DESYREL) 100 MG tablet Take 0.5-1 tablets (50-100 mg total) by mouth at bedtime. 09/14/21   Virginia Crews, MD  traZODone (DESYREL) 100 MG tablet Take by mouth.    [provider]  triamcinolone cream (KENALOG) 0.1 % Apply topically 2 (two) times daily as needed. 04/02/21   [provider]    Family History Family History  Problem Relation Age of Onset   Hypertension Mother    Hyperlipidemia Mother    Hypothyroidism Mother    Fibromyalgia Mother    Hearing loss Mother    Lung cancer Father     Coronary artery disease Father    Hyperlipidemia Father    Hypertension Father    Heart attack Father    Hearing loss Sister    Hyperlipidemia Sister    Hypertension Sister    Fibromyalgia Sister    Hemochromatosis Sister    ADD / ADHD Brother    Breast cancer Neg Hx     Social History Social History   Tobacco Use   Smoking status: Every Day    Packs/day: 1.00    Years: 49.00    Pack years: 49.00    Types: Cigarettes   Smokeless tobacco: Never   Tobacco comments:    pt declines smoking cessation ( smoked since age 10)  Vaping Use   Vaping Use: Never used  Substance Use Topics   Alcohol use: Not Currently    Alcohol/week: 0.0 standard drinks   Drug use: No     Allergies   Enbrel [etanercept], Modafinil, Augmentin  [amoxicillin-pot clavulanate], Cefdinir, and Lisinopril   Review of Systems Review of Systems  Constitutional:  Negative for chills and fever.  Gastrointestinal:  Negative for abdominal pain, nausea and vomiting.  Genitourinary:  Positive for dysuria. Negative for flank pain, hematuria, pelvic pain and vaginal discharge.  Musculoskeletal:  Positive for back pain. Negative for gait problem.  Skin:  Negative for color change and rash.  All other systems reviewed and are negative.   Physical Exam Triage Vital Signs ED Triage Vitals  Enc Vitals Group     BP      Pulse      Resp      Temp      Temp src      SpO2      Weight      Height      Head Circumference      Peak Flow      Pain Score      Pain Loc      Pain Edu?      Excl. in [place]?    No data found.  Updated Vital Signs BP (!) 149/91   Pulse 97   Temp 98.1 F (36.7 C)   Resp 18   SpO2 98%   Visual Acuity Right Eye Distance:   Left Eye Distance:   Bilateral Distance:    Right Eye Near:   Left Eye Near:    Bilateral Near:     Physical Exam Vitals and nursing note reviewed.  Constitutional:      General: She is not in acute distress.    Appearance: Normal appearance. She  is well-developed. She is not ill-appearing.  HENT:     Mouth/Throat:     Mouth: Mucous membranes are moist.  Cardiovascular:     Rate and Rhythm: Normal rate and regular rhythm.     Heart sounds: Normal heart sounds.  Pulmonary:     Effort: Pulmonary effort is normal. No respiratory distress.     Breath sounds: Normal breath sounds.  Abdominal:     General: Bowel sounds are normal.     Palpations: Abdomen is soft.     Tenderness: There is no abdominal tenderness. There is no right CVA tenderness, left CVA tenderness, guarding or rebound.  Musculoskeletal:     Cervical back: Neck supple.  Skin:    General: Skin is warm and dry.  Neurological:     Mental Status: She is alert.  Psychiatric:        Mood and Affect: Mood normal.        Behavior: Behavior normal.     UC Treatments / Results  Labs (all labs ordered are listed, but only abnormal results are displayed) Labs Reviewed  POCT URINALYSIS DIP (MANUAL ENTRY) - Abnormal; Notable for the following components:      Result Value   Clarity, UA cloudy (*)    Bilirubin, UA small (*)    Ketones, POC UA trace (5) (*)    Protein Ur, POC =30 (*)    Nitrite, UA Positive (*)    Leukocytes, UA Small (1+) (*)    All other components within normal limits  URINE CULTURE    EKG   Radiology No results found.  Procedures Procedures (including critical care time)  Medications Ordered in UC Medications - No data to display  Initial Impression / Assessment and Plan / UC Course  I have reviewed the triage vital signs and the nursing notes.  Pertinent labs & imaging results that were available during my care of the patient were reviewed by me and considered in my medical decision making (see chart for details).    UTI.  Elevated blood pressure with HTN.  Treating with Macrobid. Her GFR was 82 on 08/09/2021.  Urine culture pending. Discussed with patient that we will call her if the urine culture shows the need to change or  discontinue the antibiotic. Instructed her to follow up with her PCP or urologist next week.  Also discussed with patient that her blood pressure is elevated today and needs to be rechecked by PCP in 2 to 4 weeks.  Education provided on managing hypertension.  Patient agrees to plan of care.     Final Clinical Impressions(s) / UC Diagnoses   Final diagnoses:  Urinary tract infection without hematuria, site unspecified  Elevated blood pressure reading in office with diagnosis of hypertension     Discharge Instructions      Take the antibiotic as directed.  The urine culture is pending.  We will call you if it shows the need to change or discontinue your  antibiotic.   Follow up with your primary care provider or urologist next week.    Your blood pressure is elevated today at 149/91.  Please have this rechecked by your primary care provider in 2-4 weeks.          ED Prescriptions     Medication Sig Dispense Auth. Provider   nitrofurantoin, macrocrystal-monohydrate, (MACROBID) 100 MG capsule Take 1 capsule (100 mg total) by mouth 2 (two) times daily. 10 capsule Sharion Balloon, NP      PDMP not reviewed this encounter.   Sharion Balloon, NP 11/03/21 1200

## 2021-11-03 NOTE — Discharge Instructions (Addendum)
Take the antibiotic as directed.  The urine culture is pending.  We will call you if it shows the need to change or discontinue your antibiotic.   Follow up with your primary care provider or urologist next week.    Your blood pressure is elevated today at 149/91.  Please have this rechecked by your primary care provider in 2-4 weeks.

## 2021-11-06 LAB — URINE CULTURE: Culture: >=100000 — AB

## 2021-11-07 DIAGNOSIS — M8438XA Stress fracture, other site, initial encounter for fracture: Secondary | ICD-10-CM | POA: Diagnosis not present

## 2021-11-07 DIAGNOSIS — Z79899 Other long term (current) drug therapy: Secondary | ICD-10-CM | POA: Diagnosis not present

## 2021-11-07 DIAGNOSIS — G894 Chronic pain syndrome: Secondary | ICD-10-CM | POA: Diagnosis not present

## 2021-11-07 DIAGNOSIS — R110 Nausea: Secondary | ICD-10-CM | POA: Diagnosis not present

## 2021-11-07 DIAGNOSIS — M5481 Occipital neuralgia: Secondary | ICD-10-CM | POA: Diagnosis not present

## 2021-11-07 DIAGNOSIS — M25559 Pain in unspecified hip: Secondary | ICD-10-CM | POA: Diagnosis not present

## 2021-11-07 DIAGNOSIS — M5412 Radiculopathy, cervical region: Secondary | ICD-10-CM | POA: Diagnosis not present

## 2021-11-07 DIAGNOSIS — M5459 Other low back pain: Secondary | ICD-10-CM | POA: Diagnosis not present

## 2021-11-07 DIAGNOSIS — M810 Age-related osteoporosis without current pathological fracture: Secondary | ICD-10-CM | POA: Diagnosis not present

## 2021-11-07 DIAGNOSIS — N281 Cyst of kidney, acquired: Secondary | ICD-10-CM | POA: Diagnosis not present

## 2021-11-07 DIAGNOSIS — M5416 Radiculopathy, lumbar region: Secondary | ICD-10-CM | POA: Diagnosis not present

## 2021-11-08 ENCOUNTER — Other Ambulatory Visit: Payer: Self-pay | Admitting: Family Medicine

## 2021-11-08 DIAGNOSIS — E7800 Pure hypercholesterolemia, unspecified: Secondary | ICD-10-CM

## 2021-11-20 ENCOUNTER — Encounter (INDEPENDENT_AMBULATORY_CARE_PROVIDER_SITE_OTHER): Payer: Self-pay

## 2021-12-10 ENCOUNTER — Other Ambulatory Visit: Payer: Self-pay | Admitting: Student

## 2021-12-10 DIAGNOSIS — M810 Age-related osteoporosis without current pathological fracture: Secondary | ICD-10-CM | POA: Diagnosis not present

## 2021-12-10 DIAGNOSIS — J3489 Other specified disorders of nose and nasal sinuses: Secondary | ICD-10-CM | POA: Diagnosis not present

## 2021-12-10 DIAGNOSIS — R202 Paresthesia of skin: Secondary | ICD-10-CM | POA: Diagnosis not present

## 2021-12-10 DIAGNOSIS — I6381 Other cerebral infarction due to occlusion or stenosis of small artery: Secondary | ICD-10-CM | POA: Diagnosis not present

## 2021-12-10 DIAGNOSIS — R2990 Unspecified symptoms and signs involving the nervous system: Secondary | ICD-10-CM | POA: Diagnosis not present

## 2021-12-10 DIAGNOSIS — H43391 Other vitreous opacities, right eye: Secondary | ICD-10-CM | POA: Diagnosis not present

## 2021-12-16 ENCOUNTER — Other Ambulatory Visit: Payer: Self-pay | Admitting: Physician Assistant

## 2021-12-16 DIAGNOSIS — F410 Panic disorder [episodic paroxysmal anxiety] without agoraphobia: Secondary | ICD-10-CM

## 2021-12-17 NOTE — Telephone Encounter (Signed)
Requested Prescriptions  Pending Prescriptions Disp Refills  . escitalopram (LEXAPRO) 20 MG tablet [Pharmacy Med Name: ESCITALOPRAM OXALATE 20 MG TAB] 90 tablet 0    Sig: TAKE ONE TABLET BY MOUTH EVERY DAY     Psychiatry:  Antidepressants - SSRI Passed - 12/16/2021 11:19 AM      Passed - Completed PHQ-2 or PHQ-9 in the last 360 days      Passed - Valid encounter within last 6 months    Recent Outpatient Visits          2 months ago Lichen planus of tongue   [facility] Gwyneth Sprout, FNP   4 months ago [facility] [place], Dionne Bucy, MD   7 months ago Hospital discharge follow-up   [facility] Gwyneth Sprout, FNP   8 months ago Skin rash   [facility] Gwyneth Sprout, FNP   10 months ago Encounter for annual physical exam   [facility], Dionne Bucy, MD

## 2021-12-25 ENCOUNTER — Ambulatory Visit
Admission: RE | Admit: 2021-12-25 | Discharge: 2021-12-25 | Disposition: A | Discharge status: Home / Self Care | Payer: PPO | Source: Ambulatory Visit | Attending: Student | Admitting: Student

## 2021-12-25 DIAGNOSIS — I6782 Cerebral ischemia: Secondary | ICD-10-CM | POA: Insufficient documentation

## 2021-12-25 DIAGNOSIS — R202 Paresthesia of skin: Secondary | ICD-10-CM | POA: Insufficient documentation

## 2021-12-25 DIAGNOSIS — Z8673 Personal history of transient ischemic attack (TIA), and cerebral infarction without residual deficits: Secondary | ICD-10-CM | POA: Diagnosis not present

## 2021-12-26 DIAGNOSIS — M797 Fibromyalgia: Secondary | ICD-10-CM | POA: Diagnosis not present

## 2021-12-26 DIAGNOSIS — L409 Psoriasis, unspecified: Secondary | ICD-10-CM | POA: Diagnosis not present

## 2021-12-26 DIAGNOSIS — L4050 Arthropathic psoriasis, unspecified: Secondary | ICD-10-CM | POA: Diagnosis not present

## 2021-12-26 DIAGNOSIS — M539 Dorsopathy, unspecified: Secondary | ICD-10-CM | POA: Diagnosis not present

## 2022-01-28 ENCOUNTER — Inpatient Hospital Stay: Payer: PPO | Attending: Internal Medicine

## 2022-01-28 DIAGNOSIS — R5383 Other fatigue: Secondary | ICD-10-CM | POA: Diagnosis not present

## 2022-01-28 DIAGNOSIS — F17210 Nicotine dependence, cigarettes, uncomplicated: Secondary | ICD-10-CM | POA: Insufficient documentation

## 2022-01-28 DIAGNOSIS — Z79899 Other long term (current) drug therapy: Secondary | ICD-10-CM | POA: Insufficient documentation

## 2022-01-28 DIAGNOSIS — D751 Secondary polycythemia: Secondary | ICD-10-CM | POA: Diagnosis not present

## 2022-01-28 DIAGNOSIS — M810 Age-related osteoporosis without current pathological fracture: Secondary | ICD-10-CM | POA: Diagnosis not present

## 2022-01-28 LAB — CBC WITH DIFFERENTIAL/PLATELET
Abs Immature Granulocytes: 0.44 10*3/uL — ABNORMAL HIGH (ref 0.00–0.07)
Basophils Absolute: 0.1 10*3/uL (ref 0.0–0.1)
Basophils Relative: 1 %
Eosinophils Absolute: 0.2 10*3/uL (ref 0.0–0.5)
Eosinophils Relative: 2 %
HCT: 47.8 % — ABNORMAL HIGH (ref 36.0–46.0)
Hemoglobin: 15.3 g/dL — ABNORMAL HIGH (ref 12.0–15.0)
Immature Granulocytes: 4 %
Lymphocytes Relative: 31 %
Lymphs Abs: 3.5 10*3/uL (ref 0.7–4.0)
MCH: 32.8 pg (ref 26.0–34.0)
MCHC: 32 g/dL (ref 30.0–36.0)
MCV: 102.4 fL — ABNORMAL HIGH (ref 80.0–100.0)
Monocytes Absolute: 1 10*3/uL (ref 0.1–1.0)
Monocytes Relative: 9 %
Neutro Abs: 6.1 10*3/uL (ref 1.7–7.7)
Neutrophils Relative %: 53 %
Platelets: 319 10*3/uL (ref 150–400)
RBC: 4.67 MIL/uL (ref 3.87–5.11)
RDW: 12.9 % (ref 11.5–15.5)
WBC: 11.4 10*3/uL — ABNORMAL HIGH (ref 4.0–10.5)
nRBC: 0 % (ref 0.0–0.2)

## 2022-01-28 LAB — IRON AND TIBC
Iron: 131 ug/dL (ref 28–170)
Saturation Ratios: 42 % — ABNORMAL HIGH (ref 10.4–31.8)
TIBC: 312 ug/dL (ref 250–450)
UIBC: 181 ug/dL

## 2022-01-28 LAB — FERRITIN: Ferritin: 96 ng/mL (ref 11–307)

## 2022-01-30 ENCOUNTER — Ambulatory Visit: Payer: PPO | Admitting: Internal Medicine

## 2022-01-31 DIAGNOSIS — R110 Nausea: Secondary | ICD-10-CM | POA: Diagnosis not present

## 2022-01-31 DIAGNOSIS — M5412 Radiculopathy, cervical region: Secondary | ICD-10-CM | POA: Diagnosis not present

## 2022-01-31 DIAGNOSIS — Z79899 Other long term (current) drug therapy: Secondary | ICD-10-CM | POA: Diagnosis not present

## 2022-01-31 DIAGNOSIS — M8438XA Stress fracture, other site, initial encounter for fracture: Secondary | ICD-10-CM | POA: Diagnosis not present

## 2022-01-31 DIAGNOSIS — M810 Age-related osteoporosis without current pathological fracture: Secondary | ICD-10-CM | POA: Diagnosis not present

## 2022-01-31 DIAGNOSIS — G894 Chronic pain syndrome: Secondary | ICD-10-CM | POA: Diagnosis not present

## 2022-01-31 DIAGNOSIS — M5459 Other low back pain: Secondary | ICD-10-CM | POA: Diagnosis not present

## 2022-01-31 DIAGNOSIS — N281 Cyst of kidney, acquired: Secondary | ICD-10-CM | POA: Diagnosis not present

## 2022-01-31 DIAGNOSIS — M5416 Radiculopathy, lumbar region: Secondary | ICD-10-CM | POA: Diagnosis not present

## 2022-01-31 DIAGNOSIS — M25559 Pain in unspecified hip: Secondary | ICD-10-CM | POA: Diagnosis not present

## 2022-01-31 DIAGNOSIS — M5481 Occipital neuralgia: Secondary | ICD-10-CM | POA: Diagnosis not present

## 2022-02-04 ENCOUNTER — Inpatient Hospital Stay: Payer: PPO

## 2022-02-04 ENCOUNTER — Encounter: Payer: Self-pay | Admitting: Internal Medicine

## 2022-02-04 ENCOUNTER — Inpatient Hospital Stay (HOSPITAL_BASED_OUTPATIENT_CLINIC_OR_DEPARTMENT_OTHER): Payer: PPO | Admitting: Internal Medicine

## 2022-02-04 NOTE — Assessment & Plan Note (Addendum)
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  Patient continues to be asymptomatic; no evidence of organ dysfunction/overload. STABLE; phlebotomy today.  # Psoriasis arthritis-on? Enbrel-cost issue [Dr.Patel;[facility]; Rheum- STABLE.   #Mild erythrocytosis hematocrit 47-likely secondary to smoking; recommend smoking cessation. STABLE.   # Active smoker- [facility]-10/22/2020 CT scan reviewed negative for any malignancy.  Again counseled to quit smoking-cutting down.   DISPOSITION:  # HOLD Phlebotomy today # 6 months with labs [facility], iron studies, ferritin; 1 week prior, MD/ possible phlebotomy- Dr.B

## 2022-02-04 NOTE — Progress Notes (Signed)
[facility] [place] OFFICE PROGRESS NOTE  Patient Care Team: Bacigalupo, Dionne Bucy, MD as PCP - General (Family Medicine) Cammie Sickle, MD as Consulting Physician (Internal Medicine) Lonia Farber, MD as Consulting Physician (Internal Medicine) Allyson Sabal, NP as Nurse Practitioner (Pain Medicine) [place], [place] (Optometry) Quintin Alto, MD as Consulting Physician (Rheumatology) Vladimir Crofts, MD as Consulting Physician (Neurology) [name], MD (Urology) Thornton Park, MD as Referring Physician (Orthopedic Surgery) Pilar Jarvis, MD as Referring Physician (Pain Medicine) Angola, Jimmy J, MD as Referring Physician (Physical Medicine and Rehabilitation) Clyde Canterbury, MD as Referring Physician (Otolaryngology)   SUMMARY OF HEMATOLOGIC/ONCOLOGIC HISTORY:  # April 2015- 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 Manuella Ghazi Cymbalta  INTERVAL HISTORY: Alone.  Ambulating independently.  A very pleasant 71 year-old female patient with above history of  [name] following screening  without any end organ dysfunction is here for follow-up /phlebotomy.   Denies any worsening weight loss or abdominal swelling or leg swelling.  Unfortunately she continues to smoke.  Review of Systems  Constitutional:  Positive for malaise/fatigue. Negative for chills, diaphoresis, fever and weight loss.  HENT:  Negative for nosebleeds and sore throat.   Eyes:  Negative for double vision.  Respiratory:  Negative for cough, hemoptysis, sputum production, shortness of breath and wheezing.   Cardiovascular:  Negative for chest pain, palpitations, orthopnea and leg  swelling.  Gastrointestinal:  Negative for abdominal pain, blood in stool, constipation, diarrhea, heartburn, melena, nausea and vomiting.  Genitourinary:  Negative for dysuria, frequency and urgency.  Musculoskeletal:  Positive for back pain, joint pain, myalgias and neck pain.  Skin: Negative.  Negative for itching and rash.  Neurological:  Negative for dizziness, tingling, focal weakness, weakness and headaches.  Endo/Heme/Allergies:  Bruises/bleeds easily.  Psychiatric/Behavioral:  Negative for depression. The patient is not nervous/anxious and does not have insomnia.       PAST MEDICAL HISTORY :  Past Medical History:  Diagnosis Date   Allergic rhinitis    Allergy    Annular oral lichen planus    Anxiety    Arthritis    Depression    Deviated nasal septum    nasal obstruction   Guaiac positive stools    history   H. 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   History of palpitations    Hyperlipidemia    Hypertension    Migraine    Osteoarthritis    Osteoporosis    Panic disorder    Psoriasis    Psoriatic arthritis ([place])    Rheumatoid arteritis ([place])    Tinnitus     PAST SURGICAL HISTORY :   Past Surgical History:  Procedure Laterality Date   ABDOMINAL HYSTERECTOMY  1983   Ovaries have been removed.   ABDOMINOPLASTY     APPENDECTOMY     BREAST BIOPSY Right    neg- core   COLONOSCOPY  03/2006   COLONOSCOPY WITH PROPOFOL N/A 11/19/2017   Procedure: COLONOSCOPY WITH PROPOFOL;  Surgeon: Toledo, Benay Pike, MD;  Location: [facility] ENDOSCOPY;  Service: Gastroenterology;  Laterality: N/A;   ENDOSCOPIC CONCHA BULLOSA RESECTION Right 01/30/2021   Procedure: ENDOSCOPIC CONCHA BULLOSA RESECTION;  Surgeon: Clyde Canterbury, MD;  Location: [place];  Service: ENT;  Laterality: Right;   RHINOPLASTY  2002   SEPTOPLASTY N/A 01/30/2021   Procedure: SEPTOPLASTY;  Surgeon: Clyde Canterbury, MD;  Location: [place];  Service: ENT;   Laterality: N/A;   UPPER GI ENDOSCOPY  2008    FAMILY HISTORY :   Family History  Problem Relation Age of Onset   Hypertension Mother    Hyperlipidemia Mother    Hypothyroidism Mother    Fibromyalgia Mother    Hearing loss Mother    Lung cancer Father    Coronary artery disease Father    Hyperlipidemia Father    Hypertension Father    Heart attack Father    Hearing loss Sister    Hyperlipidemia Sister    Hypertension Sister    Fibromyalgia Sister    Hemochromatosis Sister    ADD / ADHD Brother    Breast cancer Neg Hx     SOCIAL HISTORY:   Social History   Tobacco Use   Smoking status: Every Day    Packs/day: 1.00    Years: 49.00    Total pack years: 49.00    Types: Cigarettes   Smokeless tobacco: Never   Tobacco comments:    pt declines smoking cessation ( smoked since age 59)  Vaping Use   Vaping Use: Never used  Substance Use Topics   Alcohol use: Not Currently    Alcohol/week: 0.0 standard drinks of alcohol   Drug use: No    ALLERGIES:  is allergic to enbrel [etanercept], modafinil, augmentin  [amoxicillin-pot clavulanate], cefdinir, and lisinopril.  MEDICATIONS:  Current Outpatient Medications  Medication Sig Dispense Refill   azelastine (ASTELIN) 0.1 % nasal spray Place 1 spray into both nostrils daily as needed.     Budeson-Glycopyrrol-Formoterol (BREZTRI AEROSPHERE) 160-9-4.8 MCG/ACT AERO Inhale 2 puffs into the lungs 2 (two) times daily. 10.7 g 11   clobetasol ointment (TEMOVATE) 0.05 % APP TOPICALLY TO GLUTEAL AREA ONCE D PRF DERMATITIS 30 g 1   COLLAGEN PO Take 30 mLs by mouth.     diclofenac Sodium (VOLTAREN) 1 % GEL Apply 2 g topically 4 (four) times daily.     DULoxetine (CYMBALTA) 20 MG capsule Cymbalta 20 mg capsule,delayed release  Take 1 capsule twice a day by oral route.     escitalopram (LEXAPRO) 20 MG tablet TAKE ONE TABLET BY MOUTH EVERY DAY 90 tablet 0   fluticasone (FLONASE) 50 MCG/ACT nasal spray Place 2 sprays into both nostrils  daily.     gabapentin (NEURONTIN) 100 MG capsule Take 100 mg by mouth 2 (two) times daily as needed.     gabapentin (NEURONTIN) 300 MG capsule Take 300 mg by mouth as needed (qhs). PRN     gentamicin ointment (GARAMYCIN) 0.1 % Apply 1 application. topically 3 (three) times daily.     hydrochlorothiazide (HYDRODIURIL) 25 MG tablet TAKE 1 TABLET BY MOUTH DAILY 90 tablet 1   hydrocortisone 2.5 % ointment Apply topically 2 (two) times daily.     loratadine (CLARITIN) 10 MG tablet Take 10 mg by mouth daily.     losartan (COZAAR) 25 MG tablet Take 25 mg by mouth daily.     methocarbamol (ROBAXIN) 750 MG tablet methocarbamol 750 mg tablet  Take 1 tablet 3 times a day by oral route for 30 days.     omeprazole (PRILOSEC) 20 MG capsule Take 1 capsule (20 mg total) by mouth 2 (two) times daily before a meal. 60 capsule 2   ondansetron (ZOFRAN) 4 MG tablet TAKE  ONE TABLET BY MOUTH EVERY EIGHT HOURS AS NEEDED FOR NAUSEA / VOMITING 30 tablet 0   oxyCODONE-acetaminophen (PERCOCET) 10-325 MG tablet Take 1 tablet by mouth every 4 (four) hours as needed for pain.     potassium chloride (KLOR-CON) 10 MEQ tablet Take 2 tablets (20 mEq total) by mouth 2 (two) times daily. 360 tablet 0   predniSONE (DELTASONE) 10 MG tablet Take 1 tablet by mouth daily.     rosuvastatin (CRESTOR) 5 MG tablet TAKE 1 TABLET BY MOUTH DAILY 90 tablet 1   traZODone (DESYREL) 100 MG tablet Take 0.5-1 tablets (50-100 mg total) by mouth at bedtime. 30 tablet 5   triamcinolone cream (KENALOG) 0.1 % Apply topically 2 (two) times daily as needed.     magic mouthwash SOLN Traditional magic mouthwash compound per pharmacy formulary. Take 15 mL, swish and swallow, up to 4 time/day (Patient not taking: Reported on 11/03/2021) 237 mL 0   No current facility-administered medications for this visit.    PHYSICAL EXAMINATION: ECOG PERFORMANCE STATUS: 0 - Asymptomatic  BP 108/69 (BP Location: Left Arm, Patient Position: Sitting, Cuff Size: Normal)    Pulse 97   Temp 98.9 F (37.2 C) (Tympanic)   Ht '5\' 1"'$  (1.549 m)   Wt 115 lb 3.2 oz (52.3 kg)   SpO2 93%   BMI 21.77 kg/m   Filed Weights   02/04/22 1301  Weight: 115 lb 3.2 oz (52.3 kg)    Physical Exam Constitutional:      Comments: Ambulating independently.  Alone.  HENT:     Head: Normocephalic and atraumatic.     Mouth/Throat:     Pharynx: No oropharyngeal exudate.  Eyes:     Pupils: Pupils are equal, round, and reactive to light.  Cardiovascular:     Rate and Rhythm: Normal rate and regular rhythm.  Pulmonary:     Effort: Pulmonary effort is normal. No respiratory distress.     Breath sounds: Normal breath sounds. No wheezing.  Abdominal:     General: Bowel sounds are normal. There is no distension.     Palpations: Abdomen is soft. There is no mass.     Tenderness: There is no abdominal tenderness. There is no guarding or rebound.  Musculoskeletal:        General: No tenderness. Normal range of motion.     Cervical back: Normal range of motion and neck supple.  Skin:    General: Skin is warm.  Neurological:     Mental Status: She is alert and oriented to person, place, and time.  Psychiatric:        Mood and Affect: Affect normal.      LABORATORY DATA:  I have reviewed the data as listed    Component Value Date/Time   NA 146 (H) 08/09/2021 1402   K 4.2 08/09/2021 1402   CL 103 08/09/2021 1402   CO2 24 08/09/2021 1402   GLUCOSE 112 (H) 08/09/2021 1402   GLUCOSE 109 (H) 04/17/2021 1614   BUN 19 08/09/2021 1402   CREATININE 0.78 08/09/2021 1402   CREATININE 0.57 05/05/2017 1653   CALCIUM 10.1 08/09/2021 1402   PROT 6.7 08/09/2021 1402   ALBUMIN 4.6 08/09/2021 1402   AST 15 08/09/2021 1402   ALT 22 08/09/2021 1402   ALKPHOS 54 08/09/2021 1402   BILITOT 0.3 08/09/2021 1402   GFRNONAA >60 04/17/2021 1614   GFRNONAA 97 05/05/2017 1653   GFRAA 83 01/20/2020 1327   GFRAA 113 05/05/2017 1653  No results found for: "SPEP", "UPEP"  Lab Results   Component Value Date   WBC 11.4 (H) 01/28/2022   NEUTROABS 6.1 01/28/2022   HGB 15.3 (H) 01/28/2022   HCT 47.8 (H) 01/28/2022   MCV 102.4 (H) 01/28/2022   PLT 319 01/28/2022      Chemistry      Component Value Date/Time   NA 146 (H) 08/09/2021 1402   K 4.2 08/09/2021 1402   CL 103 08/09/2021 1402   CO2 24 08/09/2021 1402   BUN 19 08/09/2021 1402   CREATININE 0.78 08/09/2021 1402   CREATININE 0.57 05/05/2017 1653   GLU 102 09/05/2014 0000      Component Value Date/Time   CALCIUM 10.1 08/09/2021 1402   ALKPHOS 54 08/09/2021 1402   AST 15 08/09/2021 1402   ALT 22 08/09/2021 1402   BILITOT 0.3 08/09/2021 1402       ASSESSMENT & PLAN:   [name] ([place]) # HEREDITARY  Hemochromatosis/ compound heterozygous C282Y & H63D- [> ferritin:150; Iron sa-50%]-July  iron saturations 51%; ferritin 126.  Patient continues to be asymptomatic; no evidence of organ dysfunction/overload. STABLE; phlebotomy today.  # Psoriasis arthritis-on? Enbrel-cost issue [Dr.Patel;[facility]; Rheum- STABLE.   #Mild erythrocytosis hematocrit 47-likely secondary to smoking; recommend smoking cessation. STABLE.   # Active smoker- [facility]-10/22/2020 CT scan reviewed negative for any malignancy.  Again counseled to quit smoking-cutting down.   DISPOSITION:  # HOLD Phlebotomy today # 6 months with labs [facility], iron studies, ferritin; 1 week prior, MD/ possible phlebotomy- Dr.B      Cammie Sickle, MD 02/04/2022 1:40 PM

## 2022-02-12 ENCOUNTER — Other Ambulatory Visit: Payer: Self-pay | Admitting: Family Medicine

## 2022-02-12 DIAGNOSIS — Z1231 Encounter for screening mammogram for malignant neoplasm of breast: Secondary | ICD-10-CM

## 2022-02-14 DIAGNOSIS — J310 Chronic rhinitis: Secondary | ICD-10-CM | POA: Diagnosis not present

## 2022-02-14 DIAGNOSIS — J3489 Other specified disorders of nose and nasal sinuses: Secondary | ICD-10-CM | POA: Diagnosis not present

## 2022-02-22 NOTE — Progress Notes (Deleted)
Complete physical exam   Patient: April Best   DOB: 1950-07-18   71 y.o. Female  MRN: [id number] Visit Date: 02/25/2022  Today's healthcare provider: Eulis Foster, MD   No chief complaint on file.  Subjective    April Best is a 71 y.o. female who presents today for a complete physical exam.  She reports consuming a {diet types:17450} diet. {Exercise:19826} She generally feels {well/fairly well/poorly:18703}. She reports sleeping {well/fairly well/poorly:18703}. She {does/does not:200015} have additional problems to discuss today.  HPI  Patient had AWV done 10/04/2021.  Past Medical History:  Diagnosis Date   Allergic rhinitis    Allergy    Annular oral lichen planus    Anxiety    Arthritis    Depression    Deviated nasal septum    nasal obstruction   Guaiac positive stools    history   H. 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   History of palpitations    Hyperlipidemia    Hypertension    Migraine    Osteoarthritis    Osteoporosis    Panic disorder    Psoriasis    Psoriatic arthritis ([place])    Rheumatoid arteritis ([place])    Tinnitus    Past Surgical History:  Procedure Laterality Date   ABDOMINAL HYSTERECTOMY  1983   Ovaries have been removed.   ABDOMINOPLASTY     APPENDECTOMY     BREAST BIOPSY Right    neg- core   COLONOSCOPY  03/2006   COLONOSCOPY WITH PROPOFOL N/A 11/19/2017   Procedure: COLONOSCOPY WITH PROPOFOL;  Surgeon: Toledo, Benay Pike, MD;  Location: [facility] ENDOSCOPY;  Service: Gastroenterology;  Laterality: N/A;   ENDOSCOPIC CONCHA BULLOSA RESECTION Right 01/30/2021   Procedure: ENDOSCOPIC CONCHA BULLOSA RESECTION;  Surgeon: Clyde Canterbury, MD;  Location: [place];  Service: ENT;  Laterality: Right;   RHINOPLASTY  2002   SEPTOPLASTY N/A 01/30/2021   Procedure: SEPTOPLASTY;  Surgeon: Clyde Canterbury, MD;  Location: [place];  Service: ENT;  Laterality: N/A;   UPPER  GI ENDOSCOPY  2008   Social History   Socioeconomic History   Marital status: Married    Spouse name: Not on file   Number of children: 1   Years of education: Not on file   Highest education level: High school graduate  Occupational History   Occupation: retired  Tobacco Use   Smoking status: Every Day    Packs/day: 1.00    Years: 49.00    Total pack years: 49.00    Types: Cigarettes   Smokeless tobacco: Never   Tobacco comments:    pt declines smoking cessation ( smoked since age 40)  Vaping Use   Vaping Use: Never used  Substance and Sexual Activity   Alcohol use: Not Currently    Alcohol/week: 0.0 standard drinks of alcohol   Drug use: No   Sexual activity: Yes    Birth control/protection: Surgical  Other Topics Concern   Not on file  Social History Narrative   Not on file   Social Determinants of Health   Financial Resource Strain: Low Risk  (09/24/2021)   Overall Financial Resource Strain (CARDIA)    Difficulty of Paying Living Expenses: Not hard at all  Food Insecurity: No Food Insecurity (09/24/2021)   Hunger Vital Sign    Worried About Running Out of Food in the Last Year: Never true    Ran Out of Food in the Last  Year: Never true  Transportation Needs: No Transportation Needs (09/24/2021)   PRAPARE - Hydrologist (Medical): No    Lack of Transportation (Non-Medical): No  Physical Activity: Inactive (09/18/2020)   Exercise Vital Sign    Days of Exercise per Week: 0 days    Minutes of Exercise per Session: 0 min  Stress: Stress Concern Present (09/24/2021)   [place]    Feeling of Stress : To some extent  Social Connections: Moderately Integrated (09/24/2021)   Social Connection and Isolation Panel [NHANES]    Frequency of Communication with Friends and Family: More than three times a week    Frequency of Social Gatherings with Friends and Family: Three times a  week    Attends Religious Services: Never    Active Member of Clubs or Organizations: Yes    Attends Music therapist: More than 4 times per year    Marital Status: Married  Human resources officer Violence: Not At Risk (09/24/2021)   Humiliation, Afraid, Rape, and Kick questionnaire    Fear of Current or Ex-Partner: No    Emotionally Abused: No    Physically Abused: No    Sexually Abused: No   Family Status  Relation Name Status   Mother  Deceased   Father  Deceased   Sister  Alive   Sister  (Not Specified)   Brother  Alive   Neg Hx  (Not Specified)   Family History  Problem Relation Age of Onset   Hypertension Mother    Hyperlipidemia Mother    Hypothyroidism Mother    Fibromyalgia Mother    Hearing loss Mother    Lung cancer Father    Coronary artery disease Father    Hyperlipidemia Father    Hypertension Father    Heart attack Father    Hearing loss Sister    Hyperlipidemia Sister    Hypertension Sister    Fibromyalgia Sister    Hemochromatosis Sister    ADD / ADHD Brother    Breast cancer Neg Hx    Allergies  Allergen Reactions   Enbrel [Etanercept] Hives    Other reaction(s): Muscle Cramps   Modafinil Rash   Augmentin  [Amoxicillin-Pot Clavulanate] Nausea And Vomiting    as stated (XR).   Cefdinir Diarrhea and Other (See Comments)    Other reaction(s): GI intolerance, Other (see comments) Gives her Cdiff Gives her Cdiff    Lisinopril Cough    Patient Care Team: Virginia Crews, MD as PCP - General (Family Medicine) Cammie Sickle, MD as Consulting Physician (Internal Medicine) Lonia Farber, MD as Consulting Physician (Internal Medicine) Allyson Sabal, NP as Nurse Practitioner (Pain Medicine) Pa, [place] (Optometry) Quintin Alto, MD as Consulting Physician (Rheumatology) Vladimir Crofts, MD as Consulting Physician (Neurology) [name], MD (Urology) Thornton Park, MD as Referring Physician (Orthopedic  Surgery) Pilar Jarvis, MD as Referring Physician (Pain Medicine) [place], Jimmy J, MD as Referring Physician (Physical Medicine and Rehabilitation) Clyde Canterbury, MD as Referring Physician (Otolaryngology)   Medications: Outpatient Medications Prior to Visit  Medication Sig   azelastine (ASTELIN) 0.1 % nasal spray Place 1 spray into both nostrils daily as needed.   Budeson-Glycopyrrol-Formoterol (BREZTRI AEROSPHERE) 160-9-4.8 MCG/ACT AERO Inhale 2 puffs into the lungs 2 (two) times daily.   clobetasol ointment (TEMOVATE) 0.05 % APP TOPICALLY TO GLUTEAL AREA ONCE D PRF DERMATITIS   COLLAGEN PO Take 30 mLs by  mouth.   diclofenac Sodium (VOLTAREN) 1 % GEL Apply 2 g topically 4 (four) times daily.   DULoxetine (CYMBALTA) 20 MG capsule Cymbalta 20 mg capsule,delayed release  Take 1 capsule twice a day by oral route.   escitalopram (LEXAPRO) 20 MG tablet TAKE ONE TABLET BY MOUTH EVERY DAY   fluticasone (FLONASE) 50 MCG/ACT nasal spray Place 2 sprays into both nostrils daily.   gabapentin (NEURONTIN) 100 MG capsule Take 100 mg by mouth 2 (two) times daily as needed.   gabapentin (NEURONTIN) 300 MG capsule Take 300 mg by mouth as needed (qhs). PRN   gentamicin ointment (GARAMYCIN) 0.1 % Apply 1 application. topically 3 (three) times daily.   hydrochlorothiazide (HYDRODIURIL) 25 MG tablet TAKE 1 TABLET BY MOUTH DAILY   hydrocortisone 2.5 % ointment Apply topically 2 (two) times daily.   loratadine (CLARITIN) 10 MG tablet Take 10 mg by mouth daily.   losartan (COZAAR) 25 MG tablet Take 25 mg by mouth daily.   magic mouthwash SOLN Traditional magic mouthwash compound per pharmacy formulary. Take 15 mL, swish and swallow, up to 4 time/day (Patient not taking: Reported on 11/03/2021)   methocarbamol (ROBAXIN) 750 MG tablet methocarbamol 750 mg tablet  Take 1 tablet 3 times a day by oral route for 30 days.   omeprazole (PRILOSEC) 20 MG capsule Take 1 capsule (20 mg total) by mouth 2 (two) times  daily before a meal.   ondansetron (ZOFRAN) 4 MG tablet TAKE ONE TABLET BY MOUTH EVERY EIGHT HOURS AS NEEDED FOR NAUSEA / VOMITING   oxyCODONE-acetaminophen (PERCOCET) 10-325 MG tablet Take 1 tablet by mouth every 4 (four) hours as needed for pain.   potassium chloride (KLOR-CON) 10 MEQ tablet Take 2 tablets (20 mEq total) by mouth 2 (two) times daily.   predniSONE (DELTASONE) 10 MG tablet Take 1 tablet by mouth daily.   rosuvastatin (CRESTOR) 5 MG tablet TAKE 1 TABLET BY MOUTH DAILY   traZODone (DESYREL) 100 MG tablet Take 0.5-1 tablets (50-100 mg total) by mouth at bedtime.   triamcinolone cream (KENALOG) 0.1 % Apply topically 2 (two) times daily as needed.   No facility-administered medications prior to visit.    Review of Systems  {Labs  Heme  Chem  Endocrine  Serology  Results Review (optional):23779}  Objective    There were no vitals taken for this visit. {Show previous vital signs (optional):23777}   Physical Exam  ***  Last depression screening scores    08/09/2021    1:16 PM 04/23/2021    8:40 AM 02/05/2021    1:48 PM  PHQ 2/9 Scores  PHQ - 2 Score 0 0 0  PHQ- 9 Score 4 0 3   Last fall risk screening    09/24/2021    2:26 PM  [place] in the past year? 0  Number falls in past yr: 0  Injury with Fall? 0  Risk for fall due to : No Fall Risks  Follow up Falls evaluation completed   Last Audit-C alcohol use screening    09/24/2021    2:23 PM  Alcohol Use Disorder Test (AUDIT)  1. How often do you have a drink containing alcohol? 0  2. How many drinks containing alcohol do you have on a typical day when you are drinking? 0  3. How often do you have six or more drinks on one occasion? 0  AUDIT-C Score 0   A score of 3 or more in women, and 4 or more  in men indicates increased risk for alcohol abuse, EXCEPT if all of the points are from question 1   No results found for any visits on 02/25/22.  Assessment & Plan    Routine Health Maintenance and  Physical Exam  Exercise Activities and Dietary recommendations  Goals      DIET - EAT MORE FRUITS AND VEGETABLES     Exercise 150 minutes per week (moderate activity)     Recommend to exercise for 3 days a week for at least 30 minutes at a time.      Quit smoking / using tobacco        Immunization History  Administered Date(s) Administered   Fluad Quad(high Dose 65+) 04/23/2021   Influenza Split 02/21/2010, 04/22/2012, 07/15/2016   Influenza, High Dose Seasonal PF 03/05/2017, 07/08/2018   Influenza,inj,Quad PF,6+ Mos 03/23/2015   Moderna Sars-Covid-2 Vaccination 07/23/2019, 08/20/2019   Pneumococcal Conjugate-13 01/15/2017   Pneumococcal Polysaccharide-23 04/22/2012, 07/08/2018   Tdap 02/21/2010   Zoster, Live 11/04/2013    Health Maintenance  Topic Date Due   Zoster Vaccines- Shingrix (1 of 2) Never done   COVID-19 Vaccine (3 - Moderna risk series) 09/17/2019   TETANUS/TDAP  02/22/2020   MAMMOGRAM  08/02/2021   DEXA SCAN  09/05/2021   INFLUENZA VACCINE  01/15/2022   COLONOSCOPY (Pts 45-30yr Insurance coverage will need to be confirmed)  11/20/2027   Pneumonia Vaccine 71 Years old  Completed   Hepatitis C Screening  Completed   HPV VACCINES  Aged Out    Discussed health benefits of physical activity, and encouraged her to engage in regular exercise appropriate for her age and condition.  ***  No follow-ups on file.     {provider attestation***:1}   MEulis Foster MD  [phone](phone) [phone](fax)  [place]

## 2022-02-24 ENCOUNTER — Emergency Department: Payer: PPO

## 2022-02-24 ENCOUNTER — Inpatient Hospital Stay
Admission: EM | Admit: 2022-02-24 | Discharge: 2022-02-26 | DRG: 287 | Disposition: A | Discharge status: Home / Self Care | Payer: PPO | Attending: Internal Medicine | Admitting: Internal Medicine

## 2022-02-24 ENCOUNTER — Inpatient Hospital Stay: Payer: PPO

## 2022-02-24 ENCOUNTER — Encounter
Admission: EM | Disposition: A | Discharge status: Home / Self Care | Payer: Self-pay | Source: Home / Self Care | Attending: Internal Medicine

## 2022-02-24 ENCOUNTER — Inpatient Hospital Stay (HOSPITAL_COMMUNITY)
Admit: 2022-02-24 | Discharge: 2022-02-24 | Disposition: A | Discharge status: Home / Self Care | Payer: PPO | Attending: Cardiovascular Disease | Admitting: Cardiovascular Disease

## 2022-02-24 ENCOUNTER — Encounter: Payer: Self-pay | Admitting: Emergency Medicine

## 2022-02-24 ENCOUNTER — Other Ambulatory Visit: Payer: Self-pay

## 2022-02-24 DIAGNOSIS — F410 Panic disorder [episodic paroxysmal anxiety] without agoraphobia: Secondary | ICD-10-CM | POA: Diagnosis not present

## 2022-02-24 DIAGNOSIS — Z7951 Long term (current) use of inhaled steroids: Secondary | ICD-10-CM | POA: Diagnosis not present

## 2022-02-24 DIAGNOSIS — J449 Chronic obstructive pulmonary disease, unspecified: Secondary | ICD-10-CM

## 2022-02-24 DIAGNOSIS — M069 Rheumatoid arthritis, unspecified: Secondary | ICD-10-CM

## 2022-02-24 DIAGNOSIS — R0789 Other chest pain: Secondary | ICD-10-CM | POA: Diagnosis present

## 2022-02-24 DIAGNOSIS — Z90710 Acquired absence of both cervix and uterus: Secondary | ICD-10-CM | POA: Diagnosis not present

## 2022-02-24 DIAGNOSIS — I10 Essential (primary) hypertension: Secondary | ICD-10-CM

## 2022-02-24 DIAGNOSIS — R911 Solitary pulmonary nodule: Secondary | ICD-10-CM | POA: Diagnosis present

## 2022-02-24 DIAGNOSIS — Z801 Family history of malignant neoplasm of trachea, bronchus and lung: Secondary | ICD-10-CM | POA: Diagnosis not present

## 2022-02-24 DIAGNOSIS — F418 Other specified anxiety disorders: Secondary | ICD-10-CM

## 2022-02-24 DIAGNOSIS — Z8249 Family history of ischemic heart disease and other diseases of the circulatory system: Secondary | ICD-10-CM

## 2022-02-24 DIAGNOSIS — J9811 Atelectasis: Secondary | ICD-10-CM | POA: Diagnosis not present

## 2022-02-24 DIAGNOSIS — Z79899 Other long term (current) drug therapy: Secondary | ICD-10-CM | POA: Diagnosis not present

## 2022-02-24 DIAGNOSIS — Z720 Tobacco use: Secondary | ICD-10-CM | POA: Diagnosis not present

## 2022-02-24 DIAGNOSIS — J439 Emphysema, unspecified: Secondary | ICD-10-CM | POA: Diagnosis not present

## 2022-02-24 DIAGNOSIS — R9431 Abnormal electrocardiogram [ECG] [EKG]: Secondary | ICD-10-CM

## 2022-02-24 DIAGNOSIS — M810 Age-related osteoporosis without current pathological fracture: Secondary | ICD-10-CM | POA: Diagnosis present

## 2022-02-24 DIAGNOSIS — I2510 Atherosclerotic heart disease of native coronary artery without angina pectoris: Principal | ICD-10-CM | POA: Diagnosis present

## 2022-02-24 DIAGNOSIS — F32A Depression, unspecified: Secondary | ICD-10-CM | POA: Diagnosis present

## 2022-02-24 DIAGNOSIS — Z20822 Contact with and (suspected) exposure to covid-19: Secondary | ICD-10-CM | POA: Diagnosis not present

## 2022-02-24 DIAGNOSIS — F1011 Alcohol abuse, in remission: Secondary | ICD-10-CM | POA: Diagnosis present

## 2022-02-24 DIAGNOSIS — K219 Gastro-esophageal reflux disease without esophagitis: Secondary | ICD-10-CM | POA: Diagnosis present

## 2022-02-24 DIAGNOSIS — R079 Chest pain, unspecified: Secondary | ICD-10-CM

## 2022-02-24 DIAGNOSIS — E7800 Pure hypercholesterolemia, unspecified: Secondary | ICD-10-CM

## 2022-02-24 DIAGNOSIS — T380X5A Adverse effect of glucocorticoids and synthetic analogues, initial encounter: Secondary | ICD-10-CM | POA: Diagnosis not present

## 2022-02-24 DIAGNOSIS — N2889 Other specified disorders of kidney and ureter: Secondary | ICD-10-CM | POA: Diagnosis not present

## 2022-02-24 DIAGNOSIS — I213 ST elevation (STEMI) myocardial infarction of unspecified site: Secondary | ICD-10-CM | POA: Diagnosis not present

## 2022-02-24 DIAGNOSIS — R109 Unspecified abdominal pain: Secondary | ICD-10-CM | POA: Diagnosis not present

## 2022-02-24 DIAGNOSIS — R1010 Upper abdominal pain, unspecified: Secondary | ICD-10-CM | POA: Diagnosis present

## 2022-02-24 DIAGNOSIS — I2119 ST elevation (STEMI) myocardial infarction involving other coronary artery of inferior wall: Secondary | ICD-10-CM | POA: Diagnosis not present

## 2022-02-24 DIAGNOSIS — D72829 Elevated white blood cell count, unspecified: Secondary | ICD-10-CM | POA: Diagnosis not present

## 2022-02-24 DIAGNOSIS — F17210 Nicotine dependence, cigarettes, uncomplicated: Secondary | ICD-10-CM | POA: Diagnosis present

## 2022-02-24 DIAGNOSIS — I200 Unstable angina: Secondary | ICD-10-CM | POA: Diagnosis not present

## 2022-02-24 DIAGNOSIS — R7989 Other specified abnormal findings of blood chemistry: Secondary | ICD-10-CM | POA: Diagnosis not present

## 2022-02-24 DIAGNOSIS — E039 Hypothyroidism, unspecified: Secondary | ICD-10-CM | POA: Diagnosis present

## 2022-02-24 DIAGNOSIS — Z7952 Long term (current) use of systemic steroids: Secondary | ICD-10-CM | POA: Diagnosis not present

## 2022-02-24 DIAGNOSIS — K5730 Diverticulosis of large intestine without perforation or abscess without bleeding: Secondary | ICD-10-CM | POA: Diagnosis not present

## 2022-02-24 DIAGNOSIS — J90 Pleural effusion, not elsewhere classified: Secondary | ICD-10-CM | POA: Diagnosis not present

## 2022-02-24 DIAGNOSIS — E876 Hypokalemia: Secondary | ICD-10-CM

## 2022-02-24 HISTORY — PX: CORONARY/GRAFT ACUTE MI REVASCULARIZATION: CATH118305

## 2022-02-24 HISTORY — PX: LEFT HEART CATH AND CORONARY ANGIOGRAPHY: CATH118249

## 2022-02-24 LAB — CBC WITH DIFFERENTIAL/PLATELET
Abs Immature Granulocytes: 0.06 10*3/uL (ref 0.00–0.07)
Basophils Absolute: 0.1 10*3/uL (ref 0.0–0.1)
Basophils Relative: 1 %
Eosinophils Absolute: 0.5 10*3/uL (ref 0.0–0.5)
Eosinophils Relative: 5 %
HCT: 47.1 % — ABNORMAL HIGH (ref 36.0–46.0)
Hemoglobin: 15 g/dL (ref 12.0–15.0)
Immature Granulocytes: 1 %
Lymphocytes Relative: 35 %
Lymphs Abs: 3.7 10*3/uL (ref 0.7–4.0)
MCH: 32.1 pg (ref 26.0–34.0)
MCHC: 31.8 g/dL (ref 30.0–36.0)
MCV: 100.9 fL — ABNORMAL HIGH (ref 80.0–100.0)
Monocytes Absolute: 1.1 10*3/uL — ABNORMAL HIGH (ref 0.1–1.0)
Monocytes Relative: 10 %
Neutro Abs: 5.3 10*3/uL (ref 1.7–7.7)
Neutrophils Relative %: 48 %
Platelets: 362 10*3/uL (ref 150–400)
RBC: 4.67 MIL/uL (ref 3.87–5.11)
RDW: 12.2 % (ref 11.5–15.5)
WBC: 10.7 10*3/uL — ABNORMAL HIGH (ref 4.0–10.5)
nRBC: 0 % (ref 0.0–0.2)

## 2022-02-24 LAB — TROPONIN I (HIGH SENSITIVITY)
Troponin I (High Sensitivity): 5 ng/L (ref <18)
Troponin I (High Sensitivity): 6 ng/L (ref <18)

## 2022-02-24 LAB — COMPREHENSIVE METABOLIC PANEL
ALT: 15 U/L (ref 0–44)
AST: 19 U/L (ref 15–41)
Albumin: 3.9 g/dL (ref 3.5–5.0)
Alkaline Phosphatase: 48 U/L (ref 38–126)
Anion gap: 7 (ref 5–15)
BUN: 13 mg/dL (ref 8–23)
CO2: 30 mmol/L (ref 22–32)
Calcium: 8.8 mg/dL — ABNORMAL LOW (ref 8.9–10.3)
Chloride: 103 mmol/L (ref 98–111)
Creatinine, Ser: 0.68 mg/dL (ref 0.44–1.00)
GFR, Estimated: >60 mL/min (ref >60)
Glucose, Bld: 131 mg/dL — ABNORMAL HIGH (ref 70–99)
Potassium: 3.5 mmol/L (ref 3.5–5.1)
Sodium: 140 mmol/L (ref 135–145)
Total Bilirubin: 0.4 mg/dL (ref 0.3–1.2)
Total Protein: 6.5 g/dL (ref 6.5–8.1)

## 2022-02-24 LAB — D-DIMER, QUANTITATIVE: D-Dimer, Quant: 0.73 ug/mL-FEU — ABNORMAL HIGH (ref 0.00–0.50)

## 2022-02-24 LAB — LIPID PANEL
Cholesterol: 185 mg/dL (ref 0–200)
HDL: 44 mg/dL (ref >40)
LDL Cholesterol: 120 mg/dL — ABNORMAL HIGH (ref 0–99)
Total CHOL/HDL Ratio: 4.2 RATIO
Triglycerides: 103 mg/dL (ref <150)
VLDL: 21 mg/dL (ref 0–40)

## 2022-02-24 LAB — RESP PANEL BY RT-PCR (FLU A&B, COVID) ARPGX2
Influenza A by PCR: NEGATIVE
Influenza B by PCR: NEGATIVE
SARS Coronavirus 2 by RT PCR: NEGATIVE

## 2022-02-24 LAB — AMYLASE: Amylase: 36 U/L (ref 28–100)

## 2022-02-24 LAB — HEMOGLOBIN A1C
Hgb A1c MFr Bld: 6 % — ABNORMAL HIGH (ref 4.8–5.6)
Mean Plasma Glucose: 125.5 mg/dL

## 2022-02-24 LAB — MRSA NEXT GEN BY PCR, NASAL: MRSA by PCR Next Gen: NOT DETECTED

## 2022-02-24 LAB — LIPASE, BLOOD: Lipase: 25 U/L (ref 11–51)

## 2022-02-24 LAB — APTT: aPTT: 30 seconds (ref 24–36)

## 2022-02-24 LAB — BRAIN NATRIURETIC PEPTIDE: B Natriuretic Peptide: 83.5 pg/mL (ref 0.0–100.0)

## 2022-02-24 LAB — PROTIME-INR
INR: 1 (ref 0.8–1.2)
Prothrombin Time: 12.7 seconds (ref 11.4–15.2)

## 2022-02-24 SURGERY — CORONARY/GRAFT ACUTE MI REVASCULARIZATION
Anesthesia: Moderate Sedation

## 2022-02-24 MED ORDER — HYDROCHLOROTHIAZIDE 25 MG PO TABS
25.0000 mg | ORAL_TABLET | Freq: Every day | ORAL | Status: DC
  Administered 2022-02-24 – 2022-02-25 (×2): 25 mg via ORAL
  Filled 2022-02-24 (×2): qty 1

## 2022-02-24 MED ORDER — DM-GUAIFENESIN ER 30-600 MG PO TB12: 1.0000 | ORAL_TABLET | Freq: Two times a day (BID) | ORAL | Status: DC | PRN

## 2022-02-24 MED ORDER — HEPARIN SODIUM (PORCINE) 5000 UNIT/ML IJ SOLN: 60.0000 [IU]/kg | Freq: Once | INTRAMUSCULAR | Status: DC

## 2022-02-24 MED ORDER — ONDANSETRON HCL 4 MG PO TABS
4.0000 mg | ORAL_TABLET | Freq: Three times a day (TID) | ORAL | Status: DC | PRN
Start: 2022-02-24 — End: 2022-02-26

## 2022-02-24 MED ORDER — SODIUM CHLORIDE 0.9 % IV BOLUS (SEPSIS): 1000.0000 mL | Freq: Once | INTRAVENOUS | Status: DC

## 2022-02-24 MED ORDER — OXYCODONE-ACETAMINOPHEN 5-325 MG PO TABS
1.0000 | ORAL_TABLET | ORAL | Status: DC | PRN
  Administered 2022-02-24 – 2022-02-25 (×5): 1 via ORAL
  Filled 2022-02-24 (×5): qty 1

## 2022-02-24 MED ORDER — VERAPAMIL HCL 2.5 MG/ML IV SOLN
INTRAVENOUS | Status: DC | PRN
  Administered 2022-02-24: 2.5 mg via INTRA_ARTERIAL

## 2022-02-24 MED ORDER — IOHEXOL 350 MG/ML SOLN
100.0000 mL | Freq: Once | INTRAVENOUS | Status: AC | PRN
  Administered 2022-02-24: 100 mL via INTRAVENOUS

## 2022-02-24 MED ORDER — NOREPINEPHRINE 4 MG/250ML-% IV SOLN: 0.0000 ug/min | INTRAVENOUS | Status: DC

## 2022-02-24 MED ORDER — SODIUM CHLORIDE 0.9% FLUSH
3.0000 mL | INTRAVENOUS | Status: DC | PRN
  Administered 2022-02-24: 3 mL via INTRAVENOUS

## 2022-02-24 MED ORDER — POTASSIUM CHLORIDE CRYS ER 20 MEQ PO TBCR
20.0000 meq | EXTENDED_RELEASE_TABLET | Freq: Two times a day (BID) | ORAL | Status: DC
  Administered 2022-02-24 – 2022-02-26 (×5): 20 meq via ORAL
  Filled 2022-02-24 (×5): qty 1

## 2022-02-24 MED ORDER — IOHEXOL 9 MG/ML PO SOLN
500.0000 mL | ORAL | Status: AC
  Administered 2022-02-24 (×2): 500 mL via ORAL

## 2022-02-24 MED ORDER — ACETAMINOPHEN 325 MG PO TABS: 650.0000 mg | ORAL_TABLET | Freq: Four times a day (QID) | ORAL | Status: DC | PRN

## 2022-02-24 MED ORDER — LIDOCAINE HCL 1 % IJ SOLN
INTRAMUSCULAR | Status: AC
  Filled 2022-02-24: qty 20

## 2022-02-24 MED ORDER — GABAPENTIN 100 MG PO CAPS
100.0000 mg | ORAL_CAPSULE | Freq: Two times a day (BID) | ORAL | Status: DC
  Administered 2022-02-24 – 2022-02-26 (×5): 100 mg via ORAL
  Filled 2022-02-24 (×5): qty 1

## 2022-02-24 MED ORDER — LOSARTAN POTASSIUM 50 MG PO TABS
25.0000 mg | ORAL_TABLET | Freq: Every day | ORAL | Status: DC
Start: 2022-02-24 — End: 2022-02-26
  Administered 2022-02-24 – 2022-02-26 (×3): 25 mg via ORAL
  Filled 2022-02-24 (×3): qty 1

## 2022-02-24 MED ORDER — HEPARIN (PORCINE) IN NACL 1000-0.9 UT/500ML-% IV SOLN
INTRAVENOUS | Status: DC | PRN
  Administered 2022-02-24 (×2): 500 mL

## 2022-02-24 MED ORDER — CHLORHEXIDINE GLUCONATE CLOTH 2 % EX PADS
6.0000 | MEDICATED_PAD | Freq: Every day | CUTANEOUS | Status: DC
  Administered 2022-02-24 – 2022-02-25 (×2): 6 via TOPICAL

## 2022-02-24 MED ORDER — SODIUM CHLORIDE 0.9% FLUSH
3.0000 mL | Freq: Two times a day (BID) | INTRAVENOUS | Status: DC
  Administered 2022-02-24 – 2022-02-26 (×5): 3 mL via INTRAVENOUS

## 2022-02-24 MED ORDER — PERFLUTREN LIPID MICROSPHERE
1.0000 mL | INTRAVENOUS | Status: AC | PRN
  Administered 2022-02-24: 3 mL via INTRAVENOUS

## 2022-02-24 MED ORDER — PREDNISONE 10 MG PO TABS
10.0000 mg | ORAL_TABLET | Freq: Every day | ORAL | Status: DC
  Administered 2022-02-24 – 2022-02-26 (×3): 10 mg via ORAL
  Filled 2022-02-24 (×3): qty 1

## 2022-02-24 MED ORDER — HEPARIN SODIUM (PORCINE) 1000 UNIT/ML IJ SOLN
INTRAMUSCULAR | Status: AC
  Filled 2022-02-24: qty 10

## 2022-02-24 MED ORDER — HEPARIN SODIUM (PORCINE) 5000 UNIT/ML IJ SOLN
INTRAMUSCULAR | Status: AC
  Filled 2022-02-24: qty 1

## 2022-02-24 MED ORDER — ACETAMINOPHEN 325 MG PO TABS: 650.0000 mg | ORAL_TABLET | ORAL | Status: DC | PRN

## 2022-02-24 MED ORDER — ORAL CARE MOUTH RINSE: 15.0000 mL | OROMUCOSAL | Status: DC | PRN

## 2022-02-24 MED ORDER — IOHEXOL 350 MG/ML SOLN: 100.0000 mL | Freq: Once | INTRAVENOUS | Status: DC | PRN

## 2022-02-24 MED ORDER — NITROGLYCERIN 0.4 MG SL SUBL
0.4000 mg | SUBLINGUAL_TABLET | Freq: Once | SUBLINGUAL | Status: AC
  Administered 2022-02-24: 0.4 mg via SUBLINGUAL
  Filled 2022-02-24: qty 1

## 2022-02-24 MED ORDER — PANTOPRAZOLE SODIUM 40 MG PO TBEC
40.0000 mg | DELAYED_RELEASE_TABLET | Freq: Every day | ORAL | Status: DC
  Administered 2022-02-24 – 2022-02-25 (×2): 40 mg via ORAL
  Filled 2022-02-24 (×2): qty 1

## 2022-02-24 MED ORDER — GABAPENTIN 300 MG PO CAPS: 300.0000 mg | ORAL_CAPSULE | Freq: Every day | ORAL | Status: DC

## 2022-02-24 MED ORDER — ONDANSETRON HCL 4 MG/2ML IJ SOLN
4.0000 mg | Freq: Three times a day (TID) | INTRAMUSCULAR | Status: DC | PRN
  Administered 2022-02-24: 4 mg via INTRAVENOUS
  Filled 2022-02-24: qty 2

## 2022-02-24 MED ORDER — ASPIRIN 81 MG PO CHEW
324.0000 mg | CHEWABLE_TABLET | Freq: Once | ORAL | Status: AC
  Administered 2022-02-24: 324 mg via ORAL
  Filled 2022-02-24: qty 4

## 2022-02-24 MED ORDER — HEPARIN (PORCINE) IN NACL 1000-0.9 UT/500ML-% IV SOLN
INTRAVENOUS | Status: AC
  Filled 2022-02-24: qty 1000

## 2022-02-24 MED ORDER — MOMETASONE FURO-FORMOTEROL FUM 100-5 MCG/ACT IN AERO
2.0000 | INHALATION_SPRAY | Freq: Two times a day (BID) | RESPIRATORY_TRACT | Status: DC
  Administered 2022-02-24 – 2022-02-26 (×5): 2 via RESPIRATORY_TRACT
  Filled 2022-02-24: qty 8.8

## 2022-02-24 MED ORDER — LIDOCAINE HCL (PF) 1 % IJ SOLN
INTRAMUSCULAR | Status: DC | PRN
  Administered 2022-02-24: 2 mL

## 2022-02-24 MED ORDER — IOHEXOL 300 MG/ML  SOLN
INTRAMUSCULAR | Status: DC | PRN
  Administered 2022-02-24: 50 mL

## 2022-02-24 MED ORDER — SODIUM CHLORIDE 0.9 % IV SOLN: INTRAVENOUS | Status: AC

## 2022-02-24 MED ORDER — NICOTINE 21 MG/24HR TD PT24
21.0000 mg | MEDICATED_PATCH | Freq: Every day | TRANSDERMAL | Status: DC
  Administered 2022-02-25: 21 mg via TRANSDERMAL
  Filled 2022-02-24 (×2): qty 1

## 2022-02-24 MED ORDER — ASPIRIN 81 MG PO CHEW
81.0000 mg | CHEWABLE_TABLET | Freq: Every day | ORAL | Status: DC
  Administered 2022-02-24 – 2022-02-26 (×3): 81 mg via ORAL
  Filled 2022-02-24 (×3): qty 1

## 2022-02-24 MED ORDER — MIDAZOLAM HCL 2 MG/2ML IJ SOLN
INTRAMUSCULAR | Status: DC | PRN
  Administered 2022-02-24: 1 mg via INTRAVENOUS

## 2022-02-24 MED ORDER — SODIUM CHLORIDE 0.9 % IV SOLN
250.0000 mL | INTRAVENOUS | Status: DC | PRN
Start: 2022-02-24 — End: 2022-02-26

## 2022-02-24 MED ORDER — MIDAZOLAM HCL 2 MG/2ML IJ SOLN
INTRAMUSCULAR | Status: AC
  Filled 2022-02-24: qty 2

## 2022-02-24 MED ORDER — SODIUM CHLORIDE 0.9 % IV SOLN: INTRAVENOUS | Status: DC

## 2022-02-24 MED ORDER — OXYCODONE-ACETAMINOPHEN 10-325 MG PO TABS: 1.0000 | ORAL_TABLET | ORAL | Status: DC | PRN

## 2022-02-24 MED ORDER — FENTANYL CITRATE (PF) 100 MCG/2ML IJ SOLN
INTRAMUSCULAR | Status: AC
  Filled 2022-02-24: qty 2

## 2022-02-24 MED ORDER — ROSUVASTATIN CALCIUM 10 MG PO TABS
10.0000 mg | ORAL_TABLET | Freq: Every day | ORAL | Status: DC
Start: 2022-02-24 — End: 2022-02-26
  Administered 2022-02-24 – 2022-02-26 (×3): 10 mg via ORAL
  Filled 2022-02-24 (×3): qty 1

## 2022-02-24 MED ORDER — HEPARIN BOLUS VIA INFUSION
3200.0000 [IU] | Freq: Once | INTRAVENOUS | Status: AC
  Administered 2022-02-24: 3200 [IU] via INTRAVENOUS
  Filled 2022-02-24: qty 3200

## 2022-02-24 MED ORDER — GABAPENTIN 300 MG PO CAPS: 300.0000 mg | ORAL_CAPSULE | Freq: Every evening | ORAL | Status: DC | PRN

## 2022-02-24 MED ORDER — HEPARIN (PORCINE) 25000 UT/250ML-% IV SOLN
700.0000 [IU]/h | INTRAVENOUS | Status: DC
  Administered 2022-02-24 (×2): 700 [IU]/h via INTRAVENOUS
  Filled 2022-02-24 (×2): qty 250

## 2022-02-24 MED ORDER — ALBUTEROL SULFATE (2.5 MG/3ML) 0.083% IN NEBU: 3.0000 mL | INHALATION_SOLUTION | RESPIRATORY_TRACT | Status: DC | PRN

## 2022-02-24 MED ORDER — ESCITALOPRAM OXALATE 20 MG PO TABS
20.0000 mg | ORAL_TABLET | Freq: Every day | ORAL | Status: DC
  Administered 2022-02-24 – 2022-02-26 (×3): 20 mg via ORAL
  Filled 2022-02-24 (×3): qty 1

## 2022-02-24 MED ORDER — FLUTICASONE PROPIONATE 50 MCG/ACT NA SUSP
2.0000 | Freq: Every day | NASAL | Status: DC
Start: 2022-02-24 — End: 2022-02-26
  Administered 2022-02-24 – 2022-02-26 (×3): 2 via NASAL
  Filled 2022-02-24: qty 16

## 2022-02-24 MED ORDER — SODIUM CHLORIDE 0.9 % IV BOLUS (SEPSIS)
1000.0000 mL | Freq: Once | INTRAVENOUS | Status: AC
  Administered 2022-02-24: 1000 mL via INTRAVENOUS

## 2022-02-24 MED ORDER — OXYCODONE HCL 5 MG PO TABS
5.0000 mg | ORAL_TABLET | ORAL | Status: DC | PRN
  Administered 2022-02-24 – 2022-02-26 (×4): 5 mg via ORAL
  Filled 2022-02-24 (×4): qty 1

## 2022-02-24 MED ORDER — MORPHINE SULFATE (PF) 2 MG/ML IV SOLN
2.0000 mg | INTRAVENOUS | Status: DC | PRN
  Administered 2022-02-24 – 2022-02-25 (×3): 2 mg via INTRAVENOUS
  Filled 2022-02-24 (×3): qty 1

## 2022-02-24 MED ORDER — ATROPINE SULFATE 1 MG/10ML IJ SOSY
PREFILLED_SYRINGE | INTRAMUSCULAR | Status: AC
  Filled 2022-02-24: qty 10

## 2022-02-24 MED ORDER — HYDRALAZINE HCL 20 MG/ML IJ SOLN: 5.0000 mg | INTRAMUSCULAR | Status: DC | PRN

## 2022-02-24 MED ORDER — VERAPAMIL HCL 2.5 MG/ML IV SOLN
INTRAVENOUS | Status: AC
  Filled 2022-02-24: qty 2

## 2022-02-24 MED ORDER — NITROGLYCERIN 0.4 MG SL SUBL
0.4000 mg | SUBLINGUAL_TABLET | SUBLINGUAL | Status: DC | PRN
  Administered 2022-02-24 (×3): 0.4 mg via SUBLINGUAL
  Filled 2022-02-24 (×3): qty 1

## 2022-02-24 MED ORDER — TRAZODONE HCL 50 MG PO TABS
50.0000 mg | ORAL_TABLET | Freq: Every day | ORAL | Status: DC
  Administered 2022-02-24 – 2022-02-25 (×2): 50 mg via ORAL
  Filled 2022-02-24 (×2): qty 1

## 2022-02-24 SURGICAL SUPPLY — 13 items
BAND ZEPHYR COMPRESS 30 LONG (HEMOSTASIS) IMPLANT
CATH INFINITI 5FR JK (CATHETERS) IMPLANT
CATH LAUNCHER 5F EBU3.5 (CATHETERS) IMPLANT
COVER PRB 48X5XTLSCP FOLD TPE (BAG) IMPLANT
COVER PROBE 5X48 (BAG) ×1
DRAPE BRACHIAL (DRAPES) IMPLANT
GLIDESHEATH SLEND SS 6F .021 (SHEATH) IMPLANT
GUIDEWIRE INQWIRE 1.5J.035X260 (WIRE) IMPLANT
INQWIRE 1.5J .035X260CM (WIRE) ×1
PACK CARDIAC CATH (CUSTOM PROCEDURE TRAY) ×1 IMPLANT
PROTECTION STATION PRESSURIZED (MISCELLANEOUS) ×1
SET ATX SIMPLICITY (MISCELLANEOUS) IMPLANT
STATION PROTECTION PRESSURIZED (MISCELLANEOUS) IMPLANT

## 2022-02-24 NOTE — Assessment & Plan Note (Addendum)
Etiology is not clear.  EKG showed ST elevation in inferior leads and precordial leads.  Urgent cardiac cath was performed by Dr. Sophronia Simas of cardiology.  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  Other differential diagnoses include pericarditis given history of rheumatoid arthritis and PE given positive D-dimer.  -Admitted to stepdown as inpatient -As needed nitroglycerin, morphine -Aspirin -[place] -Check A1c and FLP -Follow-up CT angiogram to rule out PE --> negative for PE -Follow-up lower extremity Doppler to rule out DVT --> negative for DVT

## 2022-02-24 NOTE — Assessment & Plan Note (Signed)
Stable -Bronchodilators 

## 2022-02-24 NOTE — Assessment & Plan Note (Signed)
-   IV hydralazine as needed -HCTZ and Cozaar

## 2022-02-24 NOTE — ED Notes (Signed)
Cardiologist at bedside.  

## 2022-02-24 NOTE — H&P (Signed)
History and Physical    April Best NLG:[id number] DOB: 12/04/1950 DOA: 02/24/2022  Referring MD/NP/PA:   PCP: Virginia Crews, MD   Patient coming from:  The patient is coming from home.  At baseline, pt is independent for most of ADL.        Chief Complaint: chest pain  HPI: April Best is a 71 y.o. female with medical history significant of hypertension, hyperlipidemia, COPD, rheumatoid arthritis and psoriasis, GERD, depression with anxiety, panic disorder, hereditary hemochromatosis, tobacco abuse, alcohol abuse in remission, chronic pain, hypothyroidism, who presents with chest pain.  Patient states that her chest pain started yesterday, which is located in the central and the right side of chest, sharp, 10 out of 10 in severity, radiating to the right back, pleuritic, aggravated with deep breath.  Associated with mild shortness breath.  Patient has mild dry cough, no fever or chills.  Patient states that she had nausea, mild vomiting and loose stool BM yesterday which has resolved.  Currently no nausea, vomiting or diarrhea.  Patient reports upper abdominal pain which is aching, mild, nonradiating.  No symptoms of UTI.  Patient was found to have ST elevation in inferior leads and precordial leads, code STEMI was called.  Dr. Fletcher Anon of cardiology did urgent cardiac cath.    Prox RCA lesion is 10% stenosed.   Prox LAD to Mid LAD lesion is 30% stenosed.   The left ventricular systolic function is normal.   LV end diastolic pressure is moderately elevated.   The left ventricular ejection fraction is 55-65% by visual estimate.   1.  Mild nonobstructive coronary artery disease. 2.  Normal LV systolic function and moderately elevated left ventricular end-diastolic pressure.   Recommendations: No culprit is identified for the patient's chest pain and mildly abnormal EKG. If patient's symptoms persist, recommend further work-up for noncardiac chest pain.  Data reviewed  independently and ED Course: pt was found to have troponin level 5, INR 1.1 BNP 83.5, lipase 25, amylase 36, negative COVID PCR, D-dimer +0.73, GFR> 60, temperature normal, blood pressure 153/70, heart rate 66, RR 22, oxygen saturation 96% on room air.  Chest x-ray showed vascular congestion and interstitial edema.  Patient is admitted to stepdown as inpatient.   EKG: I have personally reviewed.  Sinus rhythm, QTc 428, ST elevation in inferior leads and precordial leads.  Poor R wave progression   Review of Systems:   General: no fevers, chills, no body weight gain, has fatigue HEENT: no blurry vision, hearing changes or sore throat Respiratory: has dyspnea, coughing, no wheezing CV: has chest pain, no palpitations GI: no nausea, vomiting, has abdominal pain, no diarrhea, constipation GU: no dysuria, burning on urination, increased urinary frequency, hematuria  Ext: no leg edema Neuro: no unilateral weakness, numbness, or tingling, no vision change or hearing loss Skin: no rash, no skin tear. MSK: No muscle spasm, no deformity, no limitation of range of movement in spin Heme: No easy bruising.  Travel history: No recent long distant travel.   Allergy:  Allergies  Allergen Reactions   Enbrel [Etanercept] Hives    Other reaction(s): Muscle Cramps   Modafinil Rash   Augmentin  [Amoxicillin-Pot Clavulanate] Nausea And Vomiting    as stated (XR).   Cefdinir Diarrhea and Other (See Comments)    Other reaction(s): GI intolerance, Other (see comments) Gives her Cdiff Gives her Cdiff    Lisinopril Cough    Past Medical History:  Diagnosis Date   Allergic rhinitis  Allergy    Annular oral lichen planus    Anxiety    Arthritis    Depression    Deviated nasal septum    nasal obstruction   Guaiac positive stools    history   H. 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   History of palpitations    Hyperlipidemia    Hypertension     Migraine    Osteoarthritis    Osteoporosis    Panic disorder    Psoriasis    Psoriatic arthritis ([place])    Rheumatoid arteritis ([place])    Tinnitus     Past Surgical History:  Procedure Laterality Date   ABDOMINAL HYSTERECTOMY  1983   Ovaries have been removed.   ABDOMINOPLASTY     APPENDECTOMY     BREAST BIOPSY Right    neg- core   COLONOSCOPY  03/2006   COLONOSCOPY WITH PROPOFOL N/A 11/19/2017   Procedure: COLONOSCOPY WITH PROPOFOL;  Surgeon: Toledo, Benay Pike, MD;  Location: [facility];  Service: Gastroenterology;  Laterality: N/A;   ENDOSCOPIC CONCHA BULLOSA RESECTION Right 01/30/2021   Procedure: ENDOSCOPIC CONCHA BULLOSA RESECTION;  Surgeon: Clyde Canterbury, MD;  Location: [place];  Service: ENT;  Laterality: Right;   RHINOPLASTY  2002   SEPTOPLASTY N/A 01/30/2021   Procedure: SEPTOPLASTY;  Surgeon: Clyde Canterbury, MD;  Location: [place];  Service: ENT;  Laterality: N/A;   UPPER GI ENDOSCOPY  2008    Social History:  reports that she has been smoking cigarettes. She has a 49.00 pack-year smoking history. She has never used smokeless tobacco. She reports that she does not currently use alcohol. She reports that she does not use drugs.  Family History:  Family History  Problem Relation Age of Onset   Hypertension Mother    Hyperlipidemia Mother    Hypothyroidism Mother    Fibromyalgia Mother    Hearing loss Mother    Lung cancer Father    Coronary artery disease Father    Hyperlipidemia Father    Hypertension Father    Heart attack Father    Hearing loss Sister    Hyperlipidemia Sister    Hypertension Sister    Fibromyalgia Sister    Hemochromatosis Sister    ADD / ADHD Brother    Breast cancer Neg Hx      Prior to Admission medications   Medication Sig Start Date End Date Taking? Authorizing Provider  azelastine (ASTELIN) 0.1 % nasal spray Place 1 spray into both nostrils daily as needed.    [provider]   Budeson-Glycopyrrol-Formoterol (BREZTRI AEROSPHERE) 160-9-4.8 MCG/ACT AERO Inhale 2 puffs into the lungs 2 (two) times daily. 10/17/21   Gwyneth Sprout, FNP  clobetasol ointment (TEMOVATE) 0.05 % APP TOPICALLY TO GLUTEAL AREA ONCE D PRF DERMATITIS 10/07/18   Trinna Post, PA-C  COLLAGEN PO Take 30 mLs by mouth.    [provider]  diclofenac Sodium (VOLTAREN) 1 % GEL Apply 2 g topically 4 (four) times daily. 09/07/20   [provider]  DULoxetine (CYMBALTA) 20 MG capsule Cymbalta 20 mg capsule,delayed release  Take 1 capsule twice a day by oral route.    [provider]  escitalopram (LEXAPRO) 20 MG tablet TAKE ONE TABLET BY MOUTH EVERY DAY 12/17/21   Bacigalupo, Dionne Bucy, MD  fluticasone (FLONASE) 50 MCG/ACT nasal spray Place 2 sprays into both nostrils daily. 05/07/21   [provider]  gabapentin (NEURONTIN) 100 MG capsule Take  100 mg by mouth 2 (two) times daily as needed. 11/05/19   [provider]  gabapentin (NEURONTIN) 300 MG capsule Take 300 mg by mouth as needed (qhs). PRN    [provider]  gentamicin ointment (GARAMYCIN) 0.1 % Apply 1 application. topically 3 (three) times daily.    [provider]  hydrochlorothiazide (HYDRODIURIL) 25 MG tablet TAKE 1 TABLET BY MOUTH DAILY 09/13/21   Bacigalupo, Dionne Bucy, MD  hydrocortisone 2.5 % ointment Apply topically 2 (two) times daily. 03/29/21   [provider]  loratadine (CLARITIN) 10 MG tablet Take 10 mg by mouth daily.    [provider]  losartan (COZAAR) 25 MG tablet Take 25 mg by mouth daily. 12/31/19   [provider]  magic mouthwash SOLN Traditional magic mouthwash compound per pharmacy formulary. Take 15 mL, swish and swallow, up to 4 time/day Patient not taking: Reported on 11/03/2021 10/17/21   Gwyneth Sprout, FNP  methocarbamol (ROBAXIN) 750 MG tablet methocarbamol 750 mg tablet  Take 1 tablet 3 times a day by oral route for 30 days.    [provider]  omeprazole (PRILOSEC) 20 MG capsule Take 1 capsule (20 mg total) by mouth 2 (two) times daily before a meal. 04/05/21   Gwyneth Sprout, FNP  ondansetron (ZOFRAN) 4 MG tablet TAKE ONE TABLET BY MOUTH EVERY EIGHT HOURS AS NEEDED FOR NAUSEA / VOMITING 04/07/20   Trinna Post, PA-C  oxyCODONE-acetaminophen (PERCOCET) 10-325 MG tablet Take 1 tablet by mouth every 4 (four) hours as needed for pain.    [provider]  potassium chloride (KLOR-CON) 10 MEQ tablet Take 2 tablets (20 mEq total) by mouth 2 (two) times daily. 04/23/21   Gwyneth Sprout, FNP  predniSONE (DELTASONE) 10 MG tablet Take 1 tablet by mouth daily. 06/07/21   [provider]  rosuvastatin (CRESTOR) 5 MG tablet TAKE 1 TABLET BY MOUTH DAILY 11/09/21   Virginia Crews, MD  traZODone (DESYREL) 100 MG tablet Take 0.5-1 tablets (50-100 mg total) by mouth at bedtime. 09/14/21   Virginia Crews, MD  triamcinolone cream (KENALOG) 0.1 % Apply topically 2 (two) times daily as needed. 04/02/21   [provider]    Physical Exam: Vitals:   02/24/22 0945 02/24/22 1000 02/24/22 1030 02/24/22 1054  BP: (!) 170/71 (!) 161/66 (!) 177/76   Pulse: 69 60 63   Resp: (!) 28 19 (!) 21   Temp:    98.6 F (37 C)  TempSrc:    Oral  SpO2: 95% 97% 98%   Weight:      Height:       General: Not in acute distress HEENT:       Eyes: PERRL, EOMI, no scleral icterus.       ENT: No discharge from the ears and nose, no pharynx injection, no tonsillar enlargement.        Neck: No JVD, no bruit, no mass felt. Heme: No neck lymph node enlargement. Cardiac: S1/S2, RRR, No murmurs, No gallops or rubs. Respiratory: No rales, wheezing, rhonchi or rubs. GI: Soft, nondistended, has mild tenderness in upper abdomen, no rebound pain, no organomegaly, BS present. GU: No hematuria Ext: No pitting leg edema bilaterally. 1+DP/PT pulse bilaterally. Musculoskeletal: No joint deformities, No joint redness or warmth, no  limitation of ROM in spin. Skin: No rashes.  Neuro: Alert, oriented X3, cranial nerves II-XII grossly intact, moves all extremities normally. Psych: Patient is not psychotic, no suicidal or hemocidal ideation.  Labs on Admission: I have personally reviewed following labs and imaging studies  CBC: Recent Labs  Lab 02/24/22 0617  WBC 10.7*  NEUTROABS 5.3  HGB 15.0  HCT 47.1*  MCV 100.9*  PLT 540   Basic Metabolic Panel: Recent Labs  Lab 02/24/22 0617  NA 140  K 3.5  CL 103  CO2 30  GLUCOSE 131*  BUN 13  CREATININE 0.68  CALCIUM 8.8*   GFR: Estimated Creatinine Clearance: 49.9 mL/min (by C-G formula based on SCr of 0.68 mg/dL). Liver Function Tests: Recent Labs  Lab 02/24/22 0617  AST 19  ALT 15  ALKPHOS 48  BILITOT 0.4  PROT 6.5  ALBUMIN 3.9   Recent Labs  Lab 02/24/22 0829  LIPASE 25  AMYLASE 36   No results for input(s): "AMMONIA" in the last 168 hours. Coagulation Profile: Recent Labs  Lab 02/24/22 0617  INR 1.0   Cardiac Enzymes: No results for input(s): "CKTOTAL", "CKMB", "CKMBINDEX", "TROPONINI" in the last 168 hours. BNP (last 3 results) No results for input(s): "PROBNP" in the last 8760 hours. HbA1C: Recent Labs    02/24/22 0617  HGBA1C 6.0*   CBG: No results for input(s): "GLUCAP" in the last 168 hours. Lipid Profile: Recent Labs    02/24/22 0617  CHOL 185  HDL 44  LDLCALC 120*  TRIG 103  CHOLHDL 4.2   Thyroid Function Tests: No results for input(s): "TSH", "T4TOTAL", "FREET4", "T3FREE", "THYROIDAB" in the last 72 hours. Anemia Panel: No results for input(s): "VITAMINB12", "FOLATE", "FERRITIN", "TIBC", "IRON", "RETICCTPCT" in the last 72 hours. Urine analysis:    Component Value Date/Time   APPEARANCEUR Hazy (A) 04/24/2021 1309   GLUCOSEU Negative 04/24/2021 1309   BILIRUBINUR small (A) 11/03/2021 1128   BILIRUBINUR Negative 04/24/2021 1309   KETONESUR trace (5) (A) 11/03/2021 1128   PROTEINUR =30 (A) 11/03/2021 1128    PROTEINUR Negative 04/24/2021 1309   UROBILINOGEN 0.2 11/03/2021 1128   NITRITE Positive (A) 11/03/2021 1128   NITRITE Negative 04/24/2021 1309   LEUKOCYTESUR Small (1+) (A) 11/03/2021 1128   LEUKOCYTESUR Negative 04/24/2021 1309   Sepsis Labs: '@LABRCNTIP'$ (procalcitonin:4,lacticidven:4) ) Recent Results (from the past 240 hour(s))  Resp Panel by RT-PCR (Flu A&B, Covid) Anterior Nasal Swab     Status: None   Collection Time: 02/24/22  6:17 AM   Specimen: Anterior Nasal Swab  Result Value Ref Range Status   SARS Coronavirus 2 by RT PCR NEGATIVE NEGATIVE Final    Comment: (NOTE) SARS-CoV-2 target nucleic acids are NOT DETECTED.  The SARS-CoV-2 RNA is generally detectable in upper respiratory specimens during the acute phase of infection. The lowest concentration of SARS-CoV-2 viral copies this assay can detect is 138 copies/mL. A negative result does not preclude SARS-Cov-2 infection and should not be used as the sole basis for treatment or other patient management decisions. A negative result may occur with  improper specimen collection/handling, submission of specimen other than nasopharyngeal swab, presence of viral mutation(s) within the areas targeted by this assay, and inadequate number of viral copies(<138 copies/mL). A negative result must be combined with clinical observations, patient history, and epidemiological information. The expected result is Negative.  Fact Sheet for Patients:  EntrepreneurPulse.com.au  Fact Sheet for Healthcare Providers:  IncredibleEmployment.be  This test is no t yet approved or cleared by the [place] FDA and  has been authorized for detection and/or diagnosis of SARS-CoV-2 by FDA under an Emergency Use Authorization (EUA). This EUA will remain  in effect (meaning this test can  be used) for the duration of the COVID-19 declaration under Section 564(b)(1) of the Act, 21 U.S.C.section 360bbb-3(b)(1),  unless the authorization is terminated  or revoked sooner.       Influenza A by PCR NEGATIVE NEGATIVE Final   Influenza B by PCR NEGATIVE NEGATIVE Final    Comment: (NOTE) The Xpert Xpress SARS-CoV-2/FLU/RSV plus assay is intended as an aid in the diagnosis of influenza from Nasopharyngeal swab specimens and should not be used as a sole basis for treatment. Nasal washings and aspirates are unacceptable for Xpert Xpress SARS-CoV-2/FLU/RSV testing.  Fact Sheet for Patients: EntrepreneurPulse.com.au  Fact Sheet for Healthcare Providers: IncredibleEmployment.be  This test is not yet approved or cleared by the [place] FDA and has been authorized for detection and/or diagnosis of SARS-CoV-2 by FDA under an Emergency Use Authorization (EUA). This EUA will remain in effect (meaning this test can be used) for the duration of the COVID-19 declaration under Section 564(b)(1) of the Act, 21 U.S.C. section 360bbb-3(b)(1), unless the authorization is terminated or revoked.  Performed at [facility], [address] [place], [place] [address]   MRSA Next Gen by PCR, Nasal     Status: None   Collection Time: 02/24/22  7:46 AM   Specimen: Nasal Mucosa; Nasal Swab  Result Value Ref Range Status   MRSA by PCR Next Gen NOT DETECTED NOT DETECTED Final    Comment: (NOTE) The GeneXpert MRSA Assay (FDA approved for NASAL specimens only), is one component of a comprehensive MRSA colonization surveillance program. It is not intended to diagnose MRSA infection nor to guide or monitor treatment for MRSA infections. Test performance is not FDA approved in patients less than 56 years old. Performed at [facility], [address], [place], [place] [address]      Radiological Exams on Admission: CARDIAC CATHETERIZATION  Result Date: 02/24/2022   Prox RCA lesion is 10% stenosed.   Prox LAD to Mid LAD lesion is 30% stenosed.   The left  ventricular systolic function is normal.   LV end diastolic pressure is moderately elevated.   The left ventricular ejection fraction is 55-65% by visual estimate. 1.  Mild nonobstructive coronary artery disease. 2.  Normal LV systolic function and moderately elevated left ventricular end-diastolic pressure. Recommendations: No culprit is identified for the patient's chest pain and mildly abnormal EKG. If patient's symptoms persist, recommend further work-up for noncardiac chest pain.   DG Chest Port 1 View  Result Date: 02/24/2022 CLINICAL DATA:  Chest pain or shortness of breath. EXAM: PORTABLE CHEST 1 VIEW COMPARISON:  PA Lat 10/11/2020. FINDINGS: There is mild cardiomegaly, mild perihilar vascular congestion and interstitial edema. Trace pleural effusions are beginning to form. No airspace infiltrate is seen. Mediastinal configuration is unchanged. There is mild aortic calcification and tortuosity. Translucent electrical pads overlie the left chest. There are multiple overlying monitor wires. There is mild thoracic dextroscoliosis with degenerative changes of the midthoracic spine. IMPRESSION: Perihilar vascular congestion and mild interstitial edema consistent with CHF or fluid overload. Progress chest films recommended depending on clinical response. Aortic atherosclerosis. Electronically Signed   By: Telford Nab M.D.   On: 02/24/2022 06:54      Assessment/Plan Principal Problem:   Chest pain Active Problems:   Essential hypertension   Hypercholesteremia   Upper abdominal pain   Current tobacco use   Chronic obstructive pulmonary disease (HCC)   Depression with anxiety   Leukocytosis   RA (rheumatoid arthritis) (HCC)   Assessment and Plan: *  Chest pain Etiology is not clear.  EKG showed ST elevation in inferior leads and precordial leads.  Urgent cardiac cath was performed by Dr. Sophronia Simas of cardiology.  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  Other differential diagnoses  include pericarditis given history of rheumatoid arthritis and PE given positive D-dimer.  -Admitted to stepdown as inpatient -As needed nitroglycerin, morphine -Aspirin -Cresto -Check A1c and FLP -Follow-up CT angiogram to rule out PE -Follow-up lower extremity Doppler to rule out DVT  Essential hypertension - IV hydralazine as needed -HCTZ and Cozaar  Hypercholesteremia - Crestor  Upper abdominal pain Etiology is not clear.  Lipase normal 25, amylase normal 36.  Liver function normal. -As needed Zofran and morphine -Follow-up CT scan of abdomen/pelvis  Current tobacco use - Nicotine patch  Chronic obstructive pulmonary disease (HCC) Stable -Bronchodilators  Leukocytosis WBC 10.7, mild.  No signs of infection.  Likely reactive or due to steroid use -Follow-up with CBC  RA (rheumatoid arthritis) ([place]) Patient has been on prednisone, has not been taking it for 2 weeks.  She feels like she needs to restart prednisone. -Will restart prednisone 10 mg daily          DVT ppx: SCD  Code Status: Full code  Family Communication:    Yes, patient's  daughter  at bed side.      Disposition Plan:  Anticipate discharge back to previous environment  Consults called:  Dr. Fletcher Anon of Card  Admission status and Level of care: Stepdown:  as inpt       Dispo: The patient is from: Home              Anticipated d/c is to: Home              Anticipated d/c date is: 2 days              Patient currently is not medically stable to d/c.    Severity of Illness:  The appropriate patient status for this patient is INPATIENT. Inpatient status is judged to be reasonable and necessary in order to provide the required intensity of service to ensure the patient's safety. The patient's presenting symptoms, physical exam findings, and initial radiographic and laboratory data in the context of their chronic comorbidities is felt to place them at high risk for further clinical  deterioration. Furthermore, it is not anticipated that the patient will be medically stable for discharge from the hospital within 2 midnights of admission.   * I certify that at the point of admission it is my clinical judgment that the patient will require inpatient hospital care spanning beyond 2 midnights from the point of admission due to high intensity of service, high risk for further deterioration and high frequency of surveillance required.*       Date of Service 02/24/2022    Ivor Costa [facility]   If 7PM-7AM, please contact night-coverage [email or web address] 02/24/2022, 11:13 AM

## 2022-02-24 NOTE — ED Notes (Signed)
CALLED CODE STEMI TO [facility] ([place])

## 2022-02-24 NOTE — Consult Note (Signed)
Cardiology Consultation   Patient ID: April Best MRN: [id number]; DOB: 07-27-1950  Admit date: 02/24/2022 Date of Consult: 02/24/2022  PCP:  Virginia Crews, MD   [place] Providers Cardiologist:  None    Patient Profile:   April Best is a 71 y.o. 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 Leonides Schanz.  History of Present Illness:   April Best is a 71 year old female with no prior cardiac history.  She presented to the ED with intermittent chest pain since yesterday.  The pain is substernal with some radiation to the back.  Its described as burning sensation and bandlike feeling around the chest area.  No other associated symptoms.  No nausea, vomiting or diaphoresis.  The patient had an EKG done on arrival which showed 1 mm of ST elevation in the inferior leads and thus a code STEMI was activated.  She was given aspirin and unfractionated heparin.  Given her symptoms and EKG changes, I recommended proceeding with emergent cardiac catheterization and possible PCI.  Past Medical History:  Diagnosis Date   Allergic rhinitis    Allergy    Annular oral lichen planus    Anxiety    Arthritis    Depression    Deviated nasal septum    nasal obstruction   Guaiac positive stools    history   H. 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   ABDOMINOPLASTY     APPENDECTOMY     BREAST BIOPSY  Right    neg- core   COLONOSCOPY  03/2006   COLONOSCOPY WITH PROPOFOL N/A 11/19/2017   Procedure: COLONOSCOPY WITH PROPOFOL;  Surgeon: [place], Benay Pike, MD;  Location: [facility] END[facility]COPY;  Service: Gastroenterology;  Laterality: N/A;   ENDOSCOPIC CONCHA BULLOSA RESECTION Right 01/30/2021   Procedure: ENDOSCOPIC CONCHA BULLOSA RESECTION;  Surgeon: Clyde Canterbury, MD;  Location: [place];  Service: ENT;  Laterality: Right;   RHINOPLASTY  2002   SEPTOPLASTY N/A 01/30/2021   Procedure: SEPTOPLASTY;  Surgeon: Clyde Canterbury, MD;  Location: [place];  Service: ENT;  Laterality: N/A;   UPPER GI ENDOSCOPY  2008     Home Medications:  Prior to Admission medications   Medication Sig Start Date End Date Taking? Authorizing Provider  azelastine (ASTELIN) 0.1 % nasal spray Place 1 spray into both nostrils daily as needed.    [provider]  Budeson-Glycopyrrol-Formoterol (BREZTRI AEROSPHERE) 160-9-4.8 MCG/ACT AERO Inhale 2 puffs into the lungs 2 (two) times daily. 10/17/21   Gwyneth Sprout, FNP  clobetasol ointment (TEMOVATE) 0.05 % APP TOPICALLY TO GLUTEAL AREA ONCE D PRF DERMATITIS 10/07/18   Trinna Post, PA-C  COLLAGEN PO Take 30 mLs by mouth.    [provider]  diclofenac Sodium (VOLTAREN) 1 % GEL Apply 2 g topically 4 (four) times daily. 09/07/20   [provider]  DULoxetine (CYMBALTA) 20 MG capsule Cymbalta 20 mg capsule,delayed release  Take 1 capsule twice a day by oral route.    [provider]  escitalopram (LEXAPRO) 20 MG tablet TAKE ONE TABLET BY MOUTH EVERY DAY 12/17/21   Bacigalupo, Dionne Bucy, MD  fluticasone (FLONASE) 50 MCG/ACT nasal spray Place 2 sprays into both nostrils daily. 05/07/21   [provider]  gabapentin (NEURONTIN) 100 MG capsule Take 100 mg by mouth 2 (two) times daily as needed. 11/05/19   [provider]  gabapentin (NEURONTIN) 300 MG capsule Take 300 mg by mouth as needed (qhs). PRN    [provider]  gentamicin ointment (GARAMYCIN) 0.1 % Apply 1 application. topically 3 (three) times daily.    [provider]  hydrochlorothiazide (HYDRODIURIL) 25 MG tablet TAKE 1 TABLET BY MOUTH DAILY 09/13/21   Bacigalupo, Dionne Bucy, MD  hydrocortisone 2.5 % ointment Apply topically 2 (two) times daily. 03/29/21   [provider]  loratadine (CLARITIN) 10 MG tablet Take 10 mg by mouth daily.    [provider]  losartan (COZAAR) 25 MG tablet Take 25 mg by mouth daily. 12/31/19   [provider]  magic mouthwash SOLN Traditional magic mouthwash compound per pharmacy formulary. Take 15 mL, swish and swallow, up to 4 time/day Patient not taking: Reported on 11/03/2021 10/17/21   Gwyneth Sprout, FNP  methocarbamol (ROBAXIN) 750 MG tablet methocarbamol 750 mg tablet  Take 1 tablet 3 times a day by oral route for 30 days.    [provider]  omeprazole (PRILOSEC) 20 MG capsule Take 1 capsule (20 mg total) by mouth 2 (two) times daily before a meal. 04/05/21   Gwyneth Sprout, FNP  ondansetron (ZOFRAN) 4 MG tablet TAKE ONE TABLET BY MOUTH EVERY EIGHT HOURS AS NEEDED FOR NAUSEA / VOMITING 04/07/20   Trinna Post, PA-C  oxyCODONE-acetaminophen (PERCOCET) 10-325 MG tablet Take 1 tablet by mouth every 4 (four) hours as needed for pain.    [provider]  potassium chloride (KLOR-CON) 10 MEQ tablet Take 2 tablets (20 mEq total) by mouth 2 (two) times daily. 04/23/21   Gwyneth Sprout, FNP  predniSONE (DELTASONE) 10 MG tablet Take 1 tablet by mouth daily. 06/07/21   [provider]  rosuvastatin (CRESTOR) 5 MG tablet TAKE 1 TABLET BY MOUTH DAILY 11/09/21   Virginia Crews, MD  traZODone (DESYREL) 100 MG tablet Take 0.5-1 tablets (50-100 mg total) by mouth at bedtime. 09/14/21   Virginia Crews, MD  triamcinolone cream (KENALOG) 0.1 % Apply topically 2 (two) times daily as needed. 04/02/21   [provider]    Inpatient  Medications: Scheduled Meds:  Chlorhexidine Gluconate Cloth  6 each Topical Q0600   Continuous Infusions:  sodium chloride     heparin 700 Units/hr (02/24/22 4259)   norepinephrine (LEVOPHED) Adult infusion     sodium chloride     PRN Meds: acetaminophen, albuterol, dextromethorphan-guaiFENesin, hydrALAZINE, morphine injection, nitroGLYCERIN, ondansetron (ZOFRAN) IV  Allergies:    Allergies  Allergen Reactions   Enbrel [Etanercept] Hives    Other reaction(s): Muscle Cramps   Modafinil Rash   Augmentin  [Amoxicillin-Pot Clavulanate] Nausea And Vomiting    as stated (XR).   Cefdinir Diarrhea and Other (See Comments)    Other reaction(s): GI intolerance, Other (see comments) Gives her Cdiff Gives her Cdiff    Lisinopril Cough    Social History:   Social History   Socioeconomic History   Marital status:  Married    Spouse name: Not on file   Number of children: 1   Years of education: Not on file   Highest education level: High school graduate  Occupational History   Occupation: retired  Tobacco Use   Smoking status: Every Day    Packs/day: 1.00    Years: 49.00    Total pack years: 49.00    Types: Cigarettes   Smokeless tobacco: Never   Tobacco comments:    pt declines smoking cessation ( smoked since age 10)  Vaping Use   Vaping Use: Never used  Substance and Sexual Activity   Alcohol use: Not Currently    Alcohol/week: 0.0 standard drinks of alcohol   Drug use: No   Sexual activity: Yes    Birth control/protection: Surgical  Other Topics Concern   Not on file  Social History Narrative   Not on file   Social Determinants of Health   Financial Resource Strain: Low Risk  (09/24/2021)   Overall Financial Resource Strain (CARDIA)    Difficulty of Paying Living Expenses: Not hard at all  Food Insecurity: No Food Insecurity (09/24/2021)   Hunger Vital Sign    Worried About Running Out of Food in the Last Year: Never true    [place] in the Last Year:  Never true  Transportation Needs: No Transportation Needs (09/24/2021)   PRAPARE - Hydrologist (Medical): No    Lack of Transportation (Non-Medical): No  Physical Activity: Inactive (09/18/2020)   Exercise Vital Sign    Days of Exercise per Week: 0 days    Minutes of Exercise per Session: 0 min  Stress: Stress Concern Present (09/24/2021)   [place]    Feeling of Stress : To some extent  Social Connections: Moderately Integrated (09/24/2021)   Social Connection and Isolation Panel [NHANES]    Frequency of Communication with Friends and Family: More than three times a week    Frequency of Social Gatherings with Friends and Family: Three times a week    Attends Religious Services: Never    Active Member of Clubs or Organizations: Yes    Attends Archivist Meetings: More than 4 times per year    Marital Status: Married  Human resources officer Violence: Not At Risk (09/24/2021)   Humiliation, Afraid, Rape, and Kick questionnaire    Fear of Current or Ex-Partner: No    Emotionally Abused: No    Physically Abused: No    Sexually Abused: No    Family History:    Family History  Problem Relation Age of Onset   Hypertension Mother    Hyperlipidemia Mother    Hypothyroidism Mother    Fibromyalgia Mother    Hearing loss Mother    Lung cancer Father    Coronary artery disease Father    Hyperlipidemia Father    Hypertension Father    Heart attack Father    Hearing loss Sister    Hyperlipidemia Sister    Hypertension Sister    Fibromyalgia Sister    Hemochromatosis Sister    ADD / ADHD Brother    Breast cancer Neg Hx      ROS:  Please see the history of present illness.   All other ROS reviewed and negative.     Physical Exam/Data:   Vitals:   02/24/22 0715 02/24/22 0720 02/24/22 0725 02/24/22 0733  BP: (!) 161/69 (!) 155/74 (!) 153/70 (!) 153/73  Pulse: 67 64 66   Resp: 16 16  (!) 22 17  Temp:    97.6 F (36.4 C)  TempSrc:    Oral  SpO2: 94% 96% 96% 94%  Weight:    55.9 kg  Height:    '4\' 11"'$  (1.499 m)   No intake or output data in the 24 hours ending 02/24/22 0749    02/24/2022    7:33 AM 02/24/2022    6:24 AM 02/04/2022    1:01 PM  Last 3 Weights  Weight (lbs) 123 lb 3.8 oz 119 lb 0.8 oz   115 lb 115 lb 3.2 oz  Weight (kg) 55.9 kg 54 kg   52.164 kg 52.254 kg     Body mass index is 24.89 kg/m.  General:  Well nourished, well developed, in no acute distress HEENT: normal Neck: no JVD Vascular: No carotid bruits; Distal pulses 2+ bilaterally Cardiac:  normal S1, S2; RRR; no murmur  Lungs:  clear to auscultation bilaterally, no wheezing, rhonchi or rales  Abd: soft, nontender, no hepatomegaly  Ext: no edema Musculoskeletal:  No deformities, BUE and BLE strength normal and equal Skin: warm and dry  Neuro:  CNs 2-12 intact, no focal abnormalities noted Psych:  Normal affect   EKG:  The EKG was personally reviewed and demonstrates: Sinus rhythm with 1 mm of ST elevation in the inferior leads. Telemetry:  Telemetry was personally reviewed and demonstrates:    Relevant CV Studies:   Laboratory Data:  High Sensitivity Troponin:   Recent Labs  Lab 02/24/22 0617  TROPONINIHS 5     Chemistry Recent Labs  Lab 02/24/22 0617  NA 140  K 3.5  CL 103  CO2 30  GLUCOSE 131*  BUN 13  CREATININE 0.68  CALCIUM 8.8*  GFRNONAA >60  ANIONGAP 7    Recent Labs  Lab 02/24/22 0617  PROT 6.5  ALBUMIN 3.9  AST 19  ALT 15  ALKPHOS 48  BILITOT 0.4   Lipids  Recent Labs  Lab 02/24/22 0617  CHOL 185  TRIG 103  HDL 44  LDLCALC 120*  CHOLHDL 4.2    Hematology Recent Labs  Lab 02/24/22 0617  WBC 10.7*  RBC 4.67  HGB 15.0  HCT 47.1*  MCV 100.9*  MCH 32.1  MCHC 31.8  RDW 12.2  PLT 362   Thyroid No results for input(s): "TSH", "FREET4" in the last 168 hours.  BNPNo results for input(s): "BNP", "PROBNP" in the last 168 hours.  DDimer  No results for input(s): "DDIMER" in the last 168 hours.   Radiology/Studies:  CARDIAC CATHETERIZATION  Result Date: 02/24/2022   Prox RCA lesion is 10% stenosed.   Prox LAD to Mid LAD lesion is 30% stenosed.   The left ventricular systolic function is normal.   LV end diastolic pressure is moderately elevated.   The left ventricular ejection fraction is 55-65% by visual estimate. 1.  Mild nonobstructive coronary artery disease. 2.  Normal LV systolic function and moderately elevated left ventricular end-diastolic pressure. Recommendations: No culprit is identified for the patient's chest pain and mildly abnormal EKG. If patient's symptoms persist, recommend further work-up for noncardiac chest pain.   DG Chest Port 1 View  Result Date: 02/24/2022 CLINICAL DATA:  Chest pain or shortness of breath. EXAM: PORTABLE CHEST 1 VIEW COMPARISON:  PA Lat 10/11/2020. FINDINGS: There is mild cardiomegaly, mild perihilar vascular congestion and interstitial edema. Trace pleural effusions are beginning to form. No airspace infiltrate is seen. Mediastinal configuration is  unchanged. There is mild aortic calcification and tortuosity. Translucent electrical pads overlie the left chest. There are multiple overlying monitor wires. There is mild thoracic dextroscoliosis with degenerative changes of the midthoracic spine. IMPRESSION: Perihilar vascular congestion and mild interstitial edema consistent with CHF or fluid overload. Progress chest films recommended depending on clinical response. Aortic atherosclerosis. Electronically Signed   By: Telford Nab M.D.   On: 02/24/2022 06:54     Assessment and Plan:   Chest pain with abnormal EKG: Initially, we suspected inferior STEMI based on her symptoms and EKG changes.  I proceeded with an emergent cardiac catheterization via the right radial artery which showed mild nonobstructive coronary artery disease.  No culprit is identified for her chest pain.  Ejection fraction  was normal.  Recommend medical therapy for mild nonobstructive coronary artery disease.  The exact etiology for her symptoms is not entirely clear.  I requested amylase and lipase.  Consider further imaging of chest and abdomen if she continues to be symptomatic.  The patient left the Cath Lab and she was not having any symptoms at that time.  I also requested an echocardiogram. Essential hypertension: Outpatient medications with losartan and hydrochlorothiazide.     For questions or updates, please contact [place] Please consult [email or web address] for contact info under    Signed, Kathlyn [place], MD  02/24/2022 7:49 AM

## 2022-02-24 NOTE — Assessment & Plan Note (Signed)
Patient has been on prednisone, has not been taking it for 2 weeks.  She feels like she needs to restart prednisone. -Will restart prednisone 10 mg daily

## 2022-02-24 NOTE — ED Triage Notes (Signed)
Pt arrived via POV from home, pt c/o chest pain since yesterday, worse this morning, + N/V, +dizziness, + shortness of breath, pt clenching chest.   EKG on arrival showed STEMI, Dr. Leonides Schanz notified and activated CODE STEMI. Pt taken to room 4

## 2022-02-24 NOTE — Assessment & Plan Note (Signed)
WBC 10.7, mild.  No signs of infection.  Likely reactive or due to steroid use -Follow-up with CBC

## 2022-02-24 NOTE — ED Provider Notes (Signed)
[facility] Provider Note    Event Date/Time   First MD Initiated Contact with Patient 02/24/22 [phone]     (approximate)   History   Chest Pain and Code STEMI   HPI  April Best is a 71 y.o. female with history of hypertension, hyperlipidemia, tobacco use, psoriatic arthritis who presents to the emergency department with complaints of bandlike chest pain that goes all around her chest and down both arms that started yesterday morning when she woke up from sleep.  Pain now going on for about 24 hours.  Has had shortness of breath and dizziness.  No nausea, vomiting or diaphoresis.  No history of cardiac disease.  Does not have a cardiologist.   History provided by patient and husband.    Past Medical History:  Diagnosis Date   Allergic rhinitis    Allergy    Annular oral lichen planus    Anxiety    Arthritis    Depression    Deviated nasal septum    nasal obstruction   Guaiac positive stools    history   H. 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   ABDOMINOPLASTY     APPENDECTOMY     BREAST BIOPSY Right    neg- core   COLONOSCOPY  03/2006   COLONOSCOPY WITH PROPOFOL N/A 11/19/2017   Procedure: COLONOSCOPY WITH PROPOFOL;  Surgeon: Toledo, Benay Pike, MD;  Location: [facility] ENDOSCOPY;  Service: Gastroenterology;  Laterality: N/A;   ENDOSCOPIC CONCHA BULLOSA RESECTION Right 01/30/2021   Procedure: ENDOSCOPIC CONCHA BULLOSA RESECTION;  Surgeon: Clyde Canterbury, MD;  Location: [place];  Service: ENT;  Laterality: Right;   RHINOPLASTY  2002   SEPTOPLASTY N/A 01/30/2021    Procedure: SEPTOPLASTY;  Surgeon: Clyde Canterbury, MD;  Location: [place];  Service: ENT;  Laterality: N/A;   UPPER GI ENDOSCOPY  2008    MEDICATIONS:  Prior to Admission medications   Medication Sig Start Date End Date Taking? Authorizing Provider  azelastine (ASTELIN) 0.1 % nasal spray Place 1 spray into both nostrils daily as needed.    [provider]  Budeson-Glycopyrrol-Formoterol (BREZTRI AEROSPHERE) 160-9-4.8 MCG/ACT AERO Inhale 2 puffs into the lungs 2 (two) times daily. 10/17/21   Gwyneth Sprout, FNP  clobetasol ointment (TEMOVATE) 0.05 % APP TOPICALLY TO GLUTEAL AREA ONCE D PRF DERMATITIS 10/07/18   Trinna Post, PA-C  COLLAGEN PO Take 30 mLs by mouth.    [provider]  diclofenac Sodium (VOLTAREN) 1 % GEL Apply 2 g topically 4 (four) times daily. 09/07/20   [provider]  DULoxetine (CYMBALTA) 20 MG capsule Cymbalta 20 mg capsule,delayed release  Take 1 capsule twice a day by oral route.    [provider]  escitalopram (LEXAPRO) 20 MG tablet TAKE ONE TABLET BY MOUTH EVERY DAY 12/17/21   Bacigalupo, Dionne Bucy, MD  fluticasone (FLONASE) 50 MCG/ACT nasal spray Place 2 sprays into both nostrils daily. 05/07/21   [provider]  gabapentin (NEURONTIN) 100 MG capsule Take 100  mg by mouth 2 (two) times daily as needed. 11/05/19   [provider]  gabapentin (NEURONTIN) 300 MG capsule Take 300 mg by mouth as needed (qhs). PRN    [provider]  gentamicin ointment (GARAMYCIN) 0.1 % Apply 1 application. topically 3 (three) times daily.    [provider]  hydrochlorothiazide (HYDRODIURIL) 25 MG tablet TAKE 1 TABLET BY MOUTH DAILY 09/13/21   Bacigalupo, Dionne Bucy, MD  hydrocortisone 2.5 % ointment Apply topically 2 (two) times daily. 03/29/21   [provider]  loratadine (CLARITIN) 10 MG tablet Take 10 mg by mouth daily.    [provider]  losartan (COZAAR) 25 MG tablet Take 25 mg by mouth  daily. 12/31/19   [provider]  magic mouthwash SOLN Traditional magic mouthwash compound per pharmacy formulary. Take 15 mL, swish and swallow, up to 4 time/day Patient not taking: Reported on 11/03/2021 10/17/21   Gwyneth Sprout, FNP  methocarbamol (ROBAXIN) 750 MG tablet methocarbamol 750 mg tablet  Take 1 tablet 3 times a day by oral route for 30 days.    [provider]  omeprazole (PRILOSEC) 20 MG capsule Take 1 capsule (20 mg total) by mouth 2 (two) times daily before a meal. 04/05/21   Gwyneth Sprout, FNP  ondansetron (ZOFRAN) 4 MG tablet TAKE ONE TABLET BY MOUTH EVERY EIGHT HOURS AS NEEDED FOR NAUSEA / VOMITING 04/07/20   Trinna Post, PA-C  oxyCODONE-acetaminophen (PERCOCET) 10-325 MG tablet Take 1 tablet by mouth every 4 (four) hours as needed for pain.    [provider]  potassium chloride (KLOR-CON) 10 MEQ tablet Take 2 tablets (20 mEq total) by mouth 2 (two) times daily. 04/23/21   Gwyneth Sprout, FNP  predniSONE (DELTASONE) 10 MG tablet Take 1 tablet by mouth daily. 06/07/21   [provider]  rosuvastatin (CRESTOR) 5 MG tablet TAKE 1 TABLET BY MOUTH DAILY 11/09/21   Virginia Crews, MD  traZODone (DESYREL) 100 MG tablet Take 0.5-1 tablets (50-100 mg total) by mouth at bedtime. 09/14/21   Virginia Crews, MD  triamcinolone cream (KENALOG) 0.1 % Apply topically 2 (two) times daily as needed. 04/02/21   [provider]    Physical Exam   Triage Vital Signs: ED Triage Vitals  Enc Vitals Group     BP      Pulse      Resp      Temp      Temp src      SpO2      Weight      Height      Head Circumference      Peak Flow      Pain Score      Pain Loc      Pain Edu?      Excl. in [place]?     Most recent vital signs: Vitals:   02/24/22 0656 02/24/22 0703  BP:    Pulse:    Resp:    Temp:    SpO2: 91% 96%    CONSTITUTIONAL: Alert and oriented and responds appropriately to questions.  Elderly, appears  uncomfortable HEAD: Normocephalic, atraumatic EYES: Conjunctivae clear, pupils appear equal, sclera nonicteric ENT: normal nose; moist mucous membranes NECK: Supple, normal ROM CARD: RRR; S1 and S2 appreciated; no murmurs, no clicks, no rubs, no gallops RESP: Normal chest excursion without splinting or tachypnea; breath sounds clear and equal bilaterally; no wheezes, no rhonchi, no rales, no hypoxia or respiratory distress, speaking  full sentences ABD/GI: Normal bowel sounds; non-distended; soft, non-tender, no rebound, no guarding, no peritoneal signs BACK: The back appears normal EXT: Normal ROM in all joints; no deformity noted, no edema; no cyanosis, no calf tenderness or calf swelling SKIN: Normal color for age and race; warm; no rash on exposed skin NEURO: Moves all extremities equally, normal speech PSYCH: The patient's mood and manner are appropriate.   ED Results / Procedures / Treatments   LABS: (all labs ordered are listed, but only abnormal results are displayed) Labs Reviewed  CBC WITH DIFFERENTIAL/PLATELET - Abnormal; Notable for the following components:      Result Value   WBC 10.7 (*)    HCT 47.1 (*)    MCV 100.9 (*)    Monocytes Absolute 1.1 (*)    All other components within normal limits  COMPREHENSIVE METABOLIC PANEL - Abnormal; Notable for the following components:   Glucose, Bld 131 (*)    Calcium 8.8 (*)    All other components within normal limits  LIPID PANEL - Abnormal; Notable for the following components:   LDL Cholesterol 120 (*)    All other components within normal limits  RESP PANEL BY RT-PCR (FLU A&B, COVID) ARPGX2  PROTIME-INR  APTT  HEMOGLOBIN A1C  HEPARIN LEVEL (UNFRACTIONATED)  TROPONIN I (HIGH SENSITIVITY)     EKG:    Date: 02/24/2022  Rate: 61  Rhythm: normal sinus rhythm  QRS Axis: normal  Intervals: normal  ST/T Wave abnormalities: normal  Conduction Disutrbances: none  Narrative Interpretation: ST elevation in inferior  leads without reciprocal changes, no old for comparison    RADIOLOGY: My personal review and interpretation of imaging: Chest x-ray shows mild edema.  I have personally reviewed all radiology reports.   DG Chest Port 1 View  Result Date: 02/24/2022 CLINICAL DATA:  Chest pain or shortness of breath. EXAM: PORTABLE CHEST 1 VIEW COMPARISON:  PA Lat 10/11/2020. FINDINGS: There is mild cardiomegaly, mild perihilar vascular congestion and interstitial edema. Trace pleural effusions are beginning to form. No airspace infiltrate is seen. Mediastinal configuration is unchanged. There is mild aortic calcification and tortuosity. Translucent electrical pads overlie the left chest. There are multiple overlying monitor wires. There is mild thoracic dextroscoliosis with degenerative changes of the midthoracic spine. IMPRESSION: Perihilar vascular congestion and mild interstitial edema consistent with CHF or fluid overload. Progress chest films recommended depending on clinical response. Aortic atherosclerosis. Electronically Signed   By: Telford Nab M.D.   On: 02/24/2022 06:54     PROCEDURES:  Critical Care performed: Yes, see critical care procedure note(s)   CRITICAL CARE Performed by: Cyril Mourning Harris Penton   Total critical care time: 35 minutes  Critical care time was exclusive of separately billable procedures and treating other patients.  Critical care was necessary to treat or prevent imminent or life-threatening deterioration.  Critical care was time spent personally by me on the following activities: development of treatment plan with patient and/or surrogate as well as nursing, discussions with consultants, evaluation of patient's response to treatment, examination of patient, obtaining history from patient or surrogate, ordering and performing treatments and interventions, ordering and review of laboratory studies, ordering and review of radiographic studies, pulse oximetry and re-evaluation of  patient's condition.   Marland Kitchen1-3 Lead EKG Interpretation  Performed by: Kalah Pflum, Delice Bison, DO Authorized by: Lashala Laser, Delice Bison, DO     Interpretation: normal     ECG rate:  61   ECG rate assessment: normal     Rhythm: sinus rhythm  Ectopy: none     Conduction: normal       IMPRESSION / MDM / ASSESSMENT AND PLAN / ED COURSE  I reviewed the triage vital signs and the nursing notes.    Patient here with chest pain and shortness of breath.  EKGs demonstrate inferior MI.  The patient is on the cardiac monitor to evaluate for evidence of arrhythmia and/or significant heart rate changes.   DIFFERENTIAL DIAGNOSIS (includes but not limited to):   STEMI, CHF, PE, dissection   Patient's presentation is most consistent with acute presentation with potential threat to life or bodily function.   PLAN: We will obtain CBC, BMP, troponin, chest x-ray.  Will give aspirin and 1 nitroglycerin if blood pressures are normal given this is an inferior MI.  She is on cardiac monitoring.  We will give heparin bolus and infusion.  Code STEMI has been activated.     MEDICATIONS GIVEN IN ED: Medications  0.9 %  sodium chloride infusion (has no administration in time range)  heparin ADULT infusion 100 units/mL (25000 units/257m) (700 Units/hr Intravenous New Bag/Given 02/24/22 02542  sodium chloride 0.9 % bolus 1,000 mL ( Intravenous MAR Hold 02/24/22 0650)  norepinephrine (LEVOPHED) '4mg'$  in 2568m(0.016 mg/mL) premix infusion (has no administration in time range)  lidocaine (PF) (XYLOCAINE) 1 % injection (2 mLs Infiltration Given 02/24/22 0702)  Heparin (Porcine) in NaCl 1000-0.9 UT/500ML-% SOLN (500 mLs  Given 02/24/22 0702)  midazolam (VERSED) injection (1 mg Intravenous Given 02/24/22 0705)  verapamil (ISOPTIN) injection (2.5 mg Intra-arterial Given 02/24/22 0708)  iohexol (OMNIPAQUE) 300 MG/ML solution (50 mLs  Given 02/24/22 0726)  aspirin chewable tablet 324 mg (324 mg Oral Given 02/24/22 0618)   nitroGLYCERIN (NITROSTAT) SL tablet 0.4 mg (0.4 mg Sublingual Given 02/24/22 0624)  heparin bolus via infusion 3,200 Units (3,200 Units Intravenous Bolus from Bag 02/24/22 0632)  sodium chloride 0.9 % bolus 1,000 mL (1,000 mLs Intravenous New Bag/Given 02/24/22 0634)     ED COURSE: Systolic blood pressure dropped into the 90s after 1 nitroglycerin.  We will bolus IV fluids and hold further nitro or morphine.  Patient did not have significant pain relief after nitroglycerin.   6:40 AM  Pt's pressures improved with IV fluid.  We will continue to monitor.  Levophed at bedside if needed.   6:47 AM  Pt transported to the Cath Lab now.   Patient's labs show mild leukocytosis.  Normal coags.  Normal electrolytes.  Troponin negative.  Chest x-ray concerning for CHF exacerbation when reviewed and interpreted by myself and the radiologist.  We will add on BNP.  CONSULTS:  6:29 AM  Spoke with Dr. ArFletcher [facility] who is on the way to the ED.   OUTSIDE RECORDS REVIEWED: Reviewed patient's last office visit with rheumatology on 12/26/2021.       FINAL CLINICAL IMPRESSION(S) / ED DIAGNOSES   Final diagnoses:  Acute ST elevation myocardial infarction (STEMI) of inferior wall (HCMesic    Rx / DC Orders   ED Discharge Orders     None        Note:  This document was prepared using Dragon voice recognition software and may include unintentional dictation errors.   Jamelle Goldston, KrDelice BisonDO 02/24/22 [phone]

## 2022-02-24 NOTE — Assessment & Plan Note (Addendum)
Etiology is not clear.  Lipase normal 25, amylase normal 36.  Liver function normal. -As needed Zofran and morphine -Follow-up CT scan of abdomen/pelvis  Addendum: CT scan showed enhancement in the wall of the gallbladder along with pericholecystic fluid. Possibility of acute cholecystitis is not excluded.  -will get US-RUQ

## 2022-02-24 NOTE — Progress Notes (Signed)
[place] for heparin infusion Indication: ACS/STEMI  Allergies  Allergen Reactions   Enbrel [Etanercept] Hives    Other reaction(s): Muscle Cramps   Modafinil Rash   Augmentin  [Amoxicillin-Pot Clavulanate] Nausea And Vomiting    as stated (XR).   Cefdinir Diarrhea and Other (See Comments)    Other reaction(s): GI intolerance, Other (see comments) Gives her Cdiff Gives her Cdiff    Lisinopril Cough    Patient Measurements: Height: '5\' 1"'$  (154.9 cm) Weight: 54 kg (119 lb 0.8 oz) IBW/kg (Calculated) : 47.8 Heparin Dosing Weight: 54 kg  Vital Signs: Temp: 97.9 F (36.6 C) (09/10 0626) Temp Source: Oral (09/10 0626) BP: 155/71 (09/10 0624) Pulse Rate: 60 (09/10 0624)  Labs: No results for input(s): "HGB", "HCT", "PLT", "APTT", "LABPROT", "INR", "HEPARINUNFRC", "HEPRLOWMOCWT", "CREATININE", "CKTOTAL", "CKMB", "TROPONINIHS" in the last 72 hours.  CrCl cannot be calculated (Patient's most recent lab result is older than the maximum 21 days allowed.).   Medical History: Past Medical History:  Diagnosis Date   Allergic rhinitis    Allergy    Annular oral lichen planus    Anxiety    Arthritis    Depression    Deviated nasal septum    nasal obstruction   Guaiac positive stools    history   H. 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   Goal of Therapy:  Heparin level 0.3-0.7 units/ml Monitor platelets by anticoagulation protocol: Yes   Plan:  Bolus 3200 units x 1 Start heparin infusion at 700 units/hr Will check HL in 8 hr after start of infusion CBC daily while on  heparin  Renda Rolls, PharmD, [facility] 02/24/2022 6:28 AM

## 2022-02-24 NOTE — Assessment & Plan Note (Signed)
CT scan showed a 5 mm nodular density in right middle lobe.  -f/u with PCP

## 2022-02-24 NOTE — Progress Notes (Signed)
  Chaplain On-Call responded to Code STEMI notification at 0616 hours to ED room 4. The medical team was assessing the patient prior to transport to the Cath Lab.  Chaplain met patient's husband Octavia Bruckner outside the room, and provided spiritual and emotional support for him. Chaplain accompanied Tim to the Special Procedures hallway waiting area, and offered supportive listening, conversation, and prayer. Tim stated that he and the patient have a strong faith, and they find support at the [employer] in [place].  Chaplain will inform the next Chaplain about this encounter for follow-up support.  Chaplain Pollyann Samples M.Div., [facility]

## 2022-02-24 NOTE — Assessment & Plan Note (Signed)
-  Nicotine patch 

## 2022-02-24 NOTE — Plan of Care (Signed)
Discussed with patient plan of care for the evening, pain management and procedures with some teach back displayed.  Problem: Education: Goal: Knowledge of General Education information will improve Description: Including pain rating scale, medication(s)/side effects and non-pharmacologic comfort measures Outcome: Progressing   Problem: Health Behavior/Discharge Planning: Goal: Ability to manage health-related needs will improve Outcome: Progressing

## 2022-02-24 NOTE — Assessment & Plan Note (Signed)
-   Crestor 

## 2022-02-25 ENCOUNTER — Encounter: Payer: Self-pay | Admitting: Cardiovascular Disease

## 2022-02-25 ENCOUNTER — Ambulatory Visit: Payer: PPO | Admitting: Family Medicine

## 2022-02-25 ENCOUNTER — Encounter: Payer: PPO | Admitting: Family Medicine

## 2022-02-25 ENCOUNTER — Inpatient Hospital Stay: Payer: PPO

## 2022-02-25 ENCOUNTER — Telehealth: Payer: Self-pay

## 2022-02-25 DIAGNOSIS — R079 Chest pain, unspecified: Secondary | ICD-10-CM | POA: Diagnosis not present

## 2022-02-25 DIAGNOSIS — J449 Chronic obstructive pulmonary disease, unspecified: Secondary | ICD-10-CM | POA: Diagnosis not present

## 2022-02-25 DIAGNOSIS — K219 Gastro-esophageal reflux disease without esophagitis: Secondary | ICD-10-CM

## 2022-02-25 DIAGNOSIS — M069 Rheumatoid arthritis, unspecified: Secondary | ICD-10-CM | POA: Diagnosis not present

## 2022-02-25 LAB — ECHOCARDIOGRAM COMPLETE
AR max vel: 2.07 cm2
AV Area VTI: 2.15 cm2
AV Area mean vel: 2.2 cm2
AV Mean grad: 6 mmHg
AV Peak grad: 12.5 mmHg
Ao pk vel: 1.77 m/s
Area-P 1/2: 3.03 cm2
Calc EF: 53.9 %
Height: 59 in
S' Lateral: 2.44 cm
Single Plane A2C EF: 54.3 %
Single Plane A4C EF: 51.5 %
Weight: 1971.79 oz

## 2022-02-25 LAB — GLUCOSE, CAPILLARY
Glucose-Capillary: 109 mg/dL — ABNORMAL HIGH (ref 70–99)
Glucose-Capillary: 95 mg/dL (ref 70–99)
Glucose-Capillary: 96 mg/dL (ref 70–99)
Glucose-Capillary: 185 mg/dL — ABNORMAL HIGH (ref 70–99)

## 2022-02-25 MED ORDER — PANTOPRAZOLE SODIUM 40 MG PO TBEC
40.0000 mg | DELAYED_RELEASE_TABLET | Freq: Two times a day (BID) | ORAL | Status: DC
  Administered 2022-02-25 – 2022-02-26 (×2): 40 mg via ORAL
  Filled 2022-02-25 (×2): qty 1

## 2022-02-25 MED ORDER — DULOXETINE HCL 20 MG PO CPEP
20.0000 mg | ORAL_CAPSULE | Freq: Two times a day (BID) | ORAL | Status: DC
  Administered 2022-02-25 – 2022-02-26 (×2): 20 mg via ORAL
  Filled 2022-02-25 (×2): qty 1

## 2022-02-25 MED ORDER — DOCUSATE SODIUM 100 MG PO CAPS: 100.0000 mg | ORAL_CAPSULE | Freq: Two times a day (BID) | ORAL | Status: DC

## 2022-02-25 MED ORDER — AMLODIPINE BESYLATE 5 MG PO TABS
5.0000 mg | ORAL_TABLET | Freq: Every day | ORAL | Status: DC
  Administered 2022-02-25 – 2022-02-26 (×2): 5 mg via ORAL
  Filled 2022-02-25 (×2): qty 1

## 2022-02-25 MED ORDER — POLYETHYLENE GLYCOL 3350 17 G PO PACK
17.0000 g | PACK | Freq: Every day | ORAL | Status: DC | PRN
Start: 2022-02-25 — End: 2022-02-26

## 2022-02-25 NOTE — Progress Notes (Signed)
[facility] [place] NAME: April Best    MR#:  [id number]  DATE OF BIRTH:  [birth year]  SUBJECTIVE:  patient came in with armholes right lateral rib cage pain radiating to the back along with some symptoms of acid reflux for last few weeks. She underwent origin cardiac cath given EKG change. Takes Prilosec over-the-counter. Denies any nausea vomiting.   VITALS:  Blood pressure 135/69, pulse 84, temperature 98.6 F (37 C), temperature source Oral, resp. rate 15, height '4\' 11"'$  (1.499 m), weight 55.9 kg, SpO2 92 %.  PHYSICAL EXAMINATION:   GENERAL:  71 y.o.-year-old patient lying in the bed with no acute distress.  LUNGS: Normal breath sounds bilaterally, no wheezing, rales, rhonchi.  CARDIOVASCULAR: S1, S2 normal. No murmurs, rubs, or gallops.  ABDOMEN: Soft, nontender, nondistended. Bowel sounds present.  EXTREMITIES: No  edema b/l.    NEUROLOGIC: nonfocal  patient is alert and awake SKIN: No obvious rash, lesion, or ulcer.   LABORATORY PANEL:  CBC Recent Labs  Lab 02/24/22 0617  WBC 10.7*  HGB 15.0  HCT 47.1*  PLT 362    Chemistries  Recent Labs  Lab 02/24/22 0617  NA 140  K 3.5  CL 103  CO2 30  GLUCOSE 131*  BUN 13  CREATININE 0.68  CALCIUM 8.8*  AST 19  ALT 15  ALKPHOS 48  BILITOT 0.4   Cardiac Enzymes No results for input(s): "TROPONINI" in the last 168 hours. RADIOLOGY:  ECHOCARDIOGRAM COMPLETE  Result Date: 02/25/2022    ECHOCARDIOGRAM REPORT   Patient Name:   April Best [facility] Date of Exam: 02/24/2022 Medical Rec #:  [id number]        Height:       59.0 in Accession #:    [id number]       Weight:       123.2 lb Date of Birth:  [birth year]        BSA:          1.501 m Patient Age:    71 years         BP:           177/76 mmHg Patient Gender: F                HR:           61 bpm. Exam Location:  [facility] Procedure: 2D Echo and Intracardiac Opacification Agent Indications:     Chest Pain  History:         Patient has  no prior history of Echocardiogram examinations.                  Risk Factors:Dyslipidemia and Diabetes.  Sonographer:     L. Thornton-Maynard Referring Phys:  [place] Diagnosing Phys: Kathlyn Sacramento MD  Sonographer Comments: Suboptimal parasternal window. IMPRESSIONS  1. Left ventricular ejection fraction, by estimation, is 60 to 65%. The left ventricle has normal function. The left ventricle has no regional wall motion abnormalities. Left ventricular diastolic parameters were normal.  2. Right ventricular systolic function is normal. The right ventricular size is normal. Tricuspid regurgitation signal is inadequate for assessing PA pressure.  3. The mitral valve is normal in structure. No evidence of mitral valve regurgitation. No evidence of mitral stenosis.  4. The aortic valve is normal in structure. Aortic valve regurgitation is not visualized. No aortic stenosis is present.  5. The inferior vena cava is normal in size with greater  than 50% respiratory variability, suggesting right atrial pressure of 3 mmHg. FINDINGS  Left Ventricle: Left ventricular ejection fraction, by estimation, is 60 to 65%. The left ventricle has normal function. The left ventricle has no regional wall motion abnormalities. Definity contrast agent was given IV to delineate the left ventricular  endocardial borders. The left ventricular internal cavity size was normal in size. There is no left ventricular hypertrophy. Left ventricular diastolic parameters were normal. Right Ventricle: The right ventricular size is normal. No increase in right ventricular wall thickness. Right ventricular systolic function is normal. Tricuspid regurgitation signal is inadequate for assessing PA pressure. Left Atrium: Left atrial size was normal in size. Right Atrium: Right atrial size was normal in size. Pericardium: There is no evidence of pericardial effusion. Mitral Valve: The mitral valve is normal in structure. No evidence of mitral  valve regurgitation. No evidence of mitral valve stenosis. Tricuspid Valve: The tricuspid valve is normal in structure. Tricuspid valve regurgitation is not demonstrated. No evidence of tricuspid stenosis. Aortic Valve: The aortic valve is normal in structure. Aortic valve regurgitation is not visualized. No aortic stenosis is present. Aortic valve mean gradient measures 6.0 mmHg. Aortic valve peak gradient measures 12.5 mmHg. Aortic valve area, by VTI measures 2.15 cm. Pulmonic Valve: The pulmonic valve was normal in structure. Pulmonic valve regurgitation is not visualized. No evidence of pulmonic stenosis. Aorta: The aortic root is normal in size and structure. Venous: The inferior vena cava is normal in size with greater than 50% respiratory variability, suggesting right atrial pressure of 3 mmHg. IAS/Shunts: No atrial level shunt detected by color flow Doppler.  LEFT VENTRICLE PLAX 2D LVIDd:         3.56 cm     Diastology LVIDs:         2.44 cm     LV e' medial:    7.40 cm/s LV PW:         0.98 cm     LV E/e' medial:  9.9 LV IVS:        0.94 cm     LV e' lateral:   8.16 cm/s LVOT diam:     2.10 cm     LV E/e' lateral: 9.0 LV SV:         74 LV SV Index:   49 LVOT Area:     3.46 cm  LV Volumes (MOD) LV vol d, MOD A2C: 54.7 ml LV vol d, MOD A4C: 67.9 ml LV vol s, MOD A2C: 25.0 ml LV vol s, MOD A4C: 32.9 ml LV SV MOD A2C:     29.7 ml LV SV MOD A4C:     67.9 ml LV SV MOD BP:      33.7 ml RIGHT VENTRICLE RV S prime:     15.10 cm/s TAPSE (M-mode): 2.3 cm LEFT ATRIUM             Index        RIGHT ATRIUM           Index LA Vol (A2C):   46.5 ml 30.97 ml/m  RA Area:     15.70 cm LA Vol (A4C):   44.5 ml 29.64 ml/m  RA Volume:   39.60 ml  26.38 ml/m LA Biplane Vol: 50.0 ml 33.30 ml/m  AORTIC VALVE                     PULMONIC VALVE AV Area (Vmax):    2.07 cm  PV Vmax:       0.89 m/s AV Area (Vmean):   2.20 cm      PV Peak grad:  3.2 mmHg AV Area (VTI):     2.15 cm AV Vmax:           177.00 cm/s AV Vmean:           115.000 cm/s AV VTI:            0.343 m AV Peak Grad:      12.5 mmHg AV Mean Grad:      6.0 mmHg LVOT Vmax:         106.00 cm/s LVOT Vmean:        72.900 cm/s LVOT VTI:          0.213 m LVOT/AV VTI ratio: 0.62  AORTA Ao Root diam: 3.00 cm MITRAL VALVE MV Area (PHT): 3.03 cm    SHUNTS MV Decel Time: 250 msec    Systemic VTI:  0.21 m MV E velocity: 73.30 cm/s  Systemic Diam: 2.10 cm MV A velocity: 64.30 cm/s MV E/A ratio:  1.14 Kathlyn Sacramento MD Electronically signed by Kathlyn Sacramento MD Signature Date/Time: 02/25/2022/10:59:25 AM    Final    US ABDOMEN LIMITED RUQ (LIVER/GB)  Result Date: 02/25/2022 CLINICAL DATA:  Pain EXAM: ULTRASOUND ABDOMEN LIMITED RIGHT UPPER QUADRANT COMPARISON:  02/24/2022 FINDINGS: Gallbladder: Gallbladder wall appears thickened measuring 4.3 mm. No gallstones, pericholecystic fluid, or sonographic Murphy's sign. Common bile duct: Diameter: 3.9 mm Liver: No focal lesion identified. Within normal limits in parenchymal echogenicity. Portal vein is patent on color Doppler imaging with normal direction of blood flow towards the liver. Other: None. IMPRESSION: Gallbladder wall thickening. No gallstones, pericholecystic fluid or sonographic Murphy's sign. If there is a high clinical concern for acute cholecystitis and further imaging is clinically indicated consider nuclear medicine hepatic biliary scan to assess patency of the cystic duct. Electronically Signed   By: Kerby Moors M.D.   On: 02/25/2022 06:58   CT ABDOMEN PELVIS W CONTRAST  Result Date: 02/24/2022 CLINICAL DATA:  Abdominal pain EXAM: CT ABDOMEN AND PELVIS WITH CONTRAST TECHNIQUE: Multidetector CT imaging of the abdomen and pelvis was performed using the standard protocol following bolus administration of intravenous contrast. RADIATION DOSE REDUCTION: This exam was performed according to the departmental dose-optimization program which includes automated exposure control, adjustment of the mA and/or kV according to  patient size and/or use of iterative reconstruction technique. CONTRAST:  176m OMNIPAQUE IOHEXOL 350 MG/ML SOLN COMPARISON:  Noncontrast CT done on 12/10/2019 FINDINGS: Lower chest: Increased interstitial markings in the subpleural region may suggest scarring. There is small patchy infiltrate in the lingula in image 53 of series 5, there is 5 mm nodule in right middle lobe. This finding was not distinctly seen on the previous study. Coronary artery calcifications are seen. Hepatobiliary: No focal abnormalities are seen in liver. There is no dilation of bile ducts. There is enhancement in the wall of the gallbladder. There is fluid adjacent to the gallbladder. There is no significant dilation of bile ducts. Pancreas: No focal abnormalities are seen. Spleen: Unremarkable. Adrenals/Urinary Tract: Adrenals are unremarkable. There is no hydronephrosis. There are no renal or ureteral stones. There is 2.3 cm fluid density structure in the lower pole of left kidney consistent with renal cysts. There are few subcentimeter low-density lesions in the right renal cortex. No follow-up imaging is recommended. Ureters are unremarkable. Urinary bladder is unremarkable. Stomach/Bowel: Stomach is moderately distended. Small bowel loops are  not dilated. Appendix is not seen. There is no focal pericecal inflammation. There is no significant focal wall thickening in colon. Scattered diverticula are seen without signs of focal acute diverticulitis. Vascular/Lymphatic: Atherosclerotic plaques and calcifications are seen in aorta and its major branches. Reproductive: Uterus is not seen. Other: There is no ascites or pneumoperitoneum. Umbilical hernia containing fat is seen. Musculoskeletal: Pectus excavatum deformity is noted. IMPRESSION: There is enhancement in the wall of the gallbladder along with pericholecystic fluid. Possibility of acute cholecystitis is not excluded. Follow-up gallbladder sonogram may be considered. There is no  evidence of intestinal obstruction or pneumoperitoneum. There is no hydronephrosis. Coronary artery calcifications are seen. Scarring in the periphery of lower lung fields may suggest chronic interstitial lung disease with fibrosis. Linear densities in lingula may suggest scarring or subsegmental atelectasis. There is 5 mm nodular density in right middle lobe. No follow-up needed if patient is low-risk.This recommendation follows the consensus statement: Guidelines for Management of Incidental Pulmonary Nodules Detected on CT Images: From the Fleischner Society 2017; Radiology 2017; 284:228-243. Diverticulosis of colon without signs of diverticulitis. Renal cysts. Other findings as described in the body of the report. Electronically Signed   By: Elmer Picker M.D.   On: 02/24/2022 15:34   CT Angio Chest Pulmonary Embolism (PE) W or WO Contrast  Result Date: 02/24/2022 CLINICAL DATA:  Positive D-dimer. EXAM: CT ANGIOGRAPHY CHEST WITH CONTRAST TECHNIQUE: Multidetector CT imaging of the chest was performed using the standard protocol during bolus administration of intravenous contrast. Multiplanar CT image reconstructions and MIPs were obtained to evaluate the vascular anatomy. RADIATION DOSE REDUCTION: This exam was performed according to the departmental dose-optimization program which includes automated exposure control, adjustment of the mA and/or kV according to patient size and/or use of iterative reconstruction technique. CONTRAST:  129m OMNIPAQUE IOHEXOL 350 MG/ML SOLN COMPARISON:  CT of the chest 08/08/2020 FINDINGS: Cardiovascular: Satisfactory opacification of the pulmonary arteries to the segmental level. No evidence of pulmonary embolism. Normal heart size. No pericardial effusion. There are atherosclerotic calcifications of the aorta. Mediastinum/Nodes: No enlarged mediastinal, hilar, or axillary lymph nodes. Thyroid gland, trachea, and esophagus demonstrate no significant findings.  Lungs/Pleura: Mild emphysematous changes are again seen. There is a small amount of atelectasis in the lung bases. There is no new focal lung infiltrate, pleural effusion or pneumothorax. Upper Abdomen: No acute abnormality. Musculoskeletal: There is pectus deformity of the chest, unchanged. Degenerative changes are seen throughout the spine. There is vertebral body hemangioma at T5. Review of the MIP images confirms the above findings. IMPRESSION: 1. No evidence for pulmonary embolism. No acute cardiopulmonary process. 2. Aortic Atherosclerosis (ICD10-I70.0) and Emphysema (ICD10-J43.9). Electronically Signed   By: ARonney AstersM.D.   On: 02/24/2022 15:25   [place]Venous Img Lower Bilateral (DVT)  Result Date: 02/24/2022 CLINICAL DATA:  Elevated D-dimer EXAM: BILATERAL LOWER EXTREMITY VENOUS DOPPLER ULTRASOUND TECHNIQUE: Gray-scale sonography with graded compression, as well as color Doppler and duplex ultrasound were performed to evaluate the lower extremity deep venous systems from the level of the common femoral vein and including the common femoral, femoral, profunda femoral, popliteal and calf veins including the posterior tibial, peroneal and gastrocnemius veins when visible. The superficial great saphenous vein was also interrogated. Spectral Doppler was utilized to evaluate flow at rest and with distal augmentation maneuvers in the common femoral, femoral and popliteal veins. COMPARISON:  None Available. FINDINGS: RIGHT LOWER EXTREMITY Common Femoral Vein: No evidence of thrombus. Normal compressibility, respiratory phasicity and response to augmentation.  Saphenofemoral Junction: No evidence of thrombus. Normal compressibility and flow on color Doppler imaging. Profunda Femoral Vein: No evidence of thrombus. Normal compressibility and flow on color Doppler imaging. Femoral Vein: No evidence of thrombus. Normal compressibility, respiratory phasicity and response to augmentation. Popliteal Vein: No evidence  of thrombus. Normal compressibility, respiratory phasicity and response to augmentation. Calf Veins: No evidence of thrombus. Normal compressibility and flow on color Doppler imaging. Superficial Great Saphenous Vein: No evidence of thrombus. Normal compressibility. Venous Reflux:  None. Other Findings:  None. LEFT LOWER EXTREMITY Common Femoral Vein: No evidence of thrombus. Normal compressibility, respiratory phasicity and response to augmentation. Saphenofemoral Junction: No evidence of thrombus. Normal compressibility and flow on color Doppler imaging. Profunda Femoral Vein: No evidence of thrombus. Normal compressibility and flow on color Doppler imaging. Femoral Vein: No evidence of thrombus. Normal compressibility, respiratory phasicity and response to augmentation. Popliteal Vein: No evidence of thrombus. Normal compressibility, respiratory phasicity and response to augmentation. Calf Veins: No evidence of thrombus. Normal compressibility and flow on color Doppler imaging. Superficial Great Saphenous Vein: No evidence of thrombus. Normal compressibility. Venous Reflux:  None. Other Findings:  None. IMPRESSION: No evidence of deep venous thrombosis in either lower extremity. Electronically Signed   By: Elmer Picker M.D.   On: 02/24/2022 13:10   CARDIAC CATHETERIZATION  Result Date: 02/24/2022   Prox RCA lesion is 10% stenosed.   Prox LAD to Mid LAD lesion is 30% stenosed.   The left ventricular systolic function is normal.   LV end diastolic pressure is moderately elevated.   The left ventricular ejection fraction is 55-65% by visual estimate. 1.  Mild nonobstructive coronary artery disease. 2.  Normal LV systolic function and moderately elevated left ventricular end-diastolic pressure. Recommendations: No culprit is identified for the patient's chest pain and mildly abnormal EKG. If patient's symptoms persist, recommend further work-up for noncardiac chest pain.   DG Chest Port 1 View  Result  Date: 02/24/2022 CLINICAL DATA:  Chest pain or shortness of breath. EXAM: PORTABLE CHEST 1 VIEW COMPARISON:  PA Lat 10/11/2020. FINDINGS: There is mild cardiomegaly, mild perihilar vascular congestion and interstitial edema. Trace pleural effusions are beginning to form. No airspace infiltrate is seen. Mediastinal configuration is unchanged. There is mild aortic calcification and tortuosity. Translucent electrical pads overlie the left chest. There are multiple overlying monitor wires. There is mild thoracic dextroscoliosis with degenerative changes of the midthoracic spine. IMPRESSION: Perihilar vascular congestion and mild interstitial edema consistent with CHF or fluid overload. Progress chest films recommended depending on clinical response. Aortic atherosclerosis. Electronically Signed   By: Telford Nab M.D.   On: 02/24/2022 06:54    Assessment and Plan  THYRA YINGER is a 71 y.o. female with medical history significant of hypertension, hyperlipidemia, COPD, rheumatoid arthritis and psoriasis, GERD, depression with anxiety, panic disorder, hereditary hemochromatosis, tobacco abuse, alcohol abuse in remission, chronic pain, hypothyroidism, who presents with chest pain.   Chest pain with abnormal EKG showing ST elevation in inferior and frequently admitted -- patient underwent urgent cardiac cath found to have mild non-obstructive CAD -- continue aspirin and statins  Right upper quadrant pain radiating from the back along with acid reflux symptoms -- increased Protonix to BID -- abnormal ultrasound abdomen showed gallbladder thickening. No pedicle stick fluid or gallbladder sludge. -- Patient is appears to be frustrated with her symptoms and will consider doing HIDA scan -- G.I. consultation for possible  EGD--Dr Vicente Males to see pt  Hypertension -- continue Cozaar --  will will change hydrochlorothiazide to amlodipine to see if it helps with symptoms of possible diffuse esophageal  spasm  Tobacco abuse -nicotine patch-  Hyperlipidemia -- Crestor  History of rheumatoid arthritis -- on PO prednisone -- follows with Dr. Posey Pronto at [facility], [place]   Procedures: Cardiac cath Family communication :none Consults : cardiology, G.I. CODE STATUS: full DVT Prophylaxis : SCD Level of care: Med-Surg Status is: Inpatient Remains inpatient appropriate because:. HIDA scan order for tomorrow  transfer out of ICU to [place] THIS PATIENT: 35 minutes.  >50% time spent on counselling and coordination of care  Note: This dictation was prepared with Dragon dictation along with smaller phrase technology. Any transcriptional errors that result from this process are unintentional.  Fritzi Mandes M.D    [facility]   CC: Primary care physician; Virginia Crews, MD

## 2022-02-25 NOTE — Telephone Encounter (Signed)
Copied from [place] [id number]. Topic: Medicare AWV >> Feb 25, 2022  8:33 AM Marcellus Scott wrote: Reason for CRM: Pt is in the hospital and cannot come into appointment today.  Pt is requesting a call back to reschedule.  Please advise.

## 2022-02-25 NOTE — Progress Notes (Signed)
Rounding Note    Patient Name: April Best Date of Encounter: 02/25/2022  [place] Cardiologist: None consult done by Dr. Fletcher Anon  Subjective   Patient seen on a.m. rounds.  Continued to complain of chest discomfort on the right side as well as a band around her chest.  She also notes pain that radiates into her back and her right shoulder.  She states that she was medicated this morning with mild relief.  She denies any shortness of breath and is continued on 2 L O2 via nasal cannula.  Inpatient Medications    Scheduled Meds:  aspirin  81 mg Oral Daily   Chlorhexidine Gluconate Cloth  6 each Topical Q0600   escitalopram  20 mg Oral Daily   fluticasone  2 spray Each Nare Daily   gabapentin  100 mg Oral BID   hydrochlorothiazide  25 mg Oral Daily   losartan  25 mg Oral Daily   mometasone-formoterol  2 puff Inhalation BID   nicotine  21 mg Transdermal Daily   pantoprazole  40 mg Oral Daily   potassium chloride  20 mEq Oral BID   predniSONE  10 mg Oral Daily   rosuvastatin  10 mg Oral Daily   sodium chloride flush  3 mL Intravenous Q12H   traZODone  50-100 mg Oral QHS   Continuous Infusions:  sodium chloride Stopped (02/24/22 0915)   sodium chloride     PRN Meds: sodium chloride, acetaminophen, albuterol, dextromethorphan-guaiFENesin, gabapentin, hydrALAZINE, morphine injection, nitroGLYCERIN, ondansetron (ZOFRAN) IV, ondansetron, mouth rinse, oxyCODONE-acetaminophen **AND** oxyCODONE, sodium chloride flush   Vital Signs    Vitals:   02/25/22 0453 02/25/22 0500 02/25/22 0600 02/25/22 0700  BP:   (!) 144/61 (!) 149/69  Pulse:  (!) 59 61 62  Resp: '16 18 20 13  '$ Temp:      TempSrc:      SpO2:  92% 97% 97%  Weight:      Height:        Intake/Output Summary (Last 24 hours) at 02/25/2022 0748 Last data filed at 02/25/2022 6967 Gross per 24 hour  Intake 1891.67 ml  Output 1000 ml  Net 891.67 ml      02/24/2022    7:33 AM 02/24/2022    6:24 AM  02/04/2022    1:01 PM  Last 3 Weights  Weight (lbs) 123 lb 3.8 oz 119 lb 0.8 oz   115 lb 115 lb 3.2 oz  Weight (kg) 55.9 kg 54 kg   52.164 kg 52.254 kg      Telemetry    Sinus rhythm rate of 60s- Personally Reviewed  ECG    No new tracings- Personally Reviewed  Physical Exam   GEN: No acute distress.   Neck: No JVD or carotid bruits Cardiac: RRR, no murmurs, rubs, or gallops.  Right radial cath site pulse 2+ without hematoma or bruising noted. Respiratory: Clear upper lobes with diminished bases to auscultation bilaterally.  Respirations are unlabored at rest and during conversation on 2 L of O2 via nasal cannula GI: Soft, mildly tender to light palpation, non-distended  MS: No edema; No deformity. Neuro:  Nonfocal  Psych: Normal affect   Labs    High Sensitivity Troponin:   Recent Labs  Lab 02/24/22 0617 02/24/22 0829  TROPONINIHS 5 6     Chemistry Recent Labs  Lab 02/24/22 0617  NA 140  K 3.5  CL 103  CO2 30  GLUCOSE 131*  BUN 13  CREATININE 0.68  CALCIUM 8.8*  PROT 6.5  ALBUMIN 3.9  AST 19  ALT 15  ALKPHOS 48  BILITOT 0.4  GFRNONAA >60  ANIONGAP 7    Lipids  Recent Labs  Lab 02/24/22 0617  CHOL 185  TRIG 103  HDL 44  LDLCALC 120*  CHOLHDL 4.2    Hematology Recent Labs  Lab 02/24/22 0617  WBC 10.7*  RBC 4.67  HGB 15.0  HCT 47.1*  MCV 100.9*  MCH 32.1  MCHC 31.8  RDW 12.2  PLT 362   Thyroid No results for input(s): "TSH", "FREET4" in the last 168 hours.  BNP Recent Labs  Lab 02/24/22 0829  BNP 83.5    DDimer  Recent Labs  Lab 02/24/22 1000  DDIMER 0.73*     Radiology    US ABDOMEN LIMITED RUQ (LIVER/GB)  Result Date: 02/25/2022 CLINICAL DATA:  Pain EXAM: ULTRASOUND ABDOMEN LIMITED RIGHT UPPER QUADRANT COMPARISON:  02/24/2022 FINDINGS: Gallbladder: Gallbladder wall appears thickened measuring 4.3 mm. No gallstones, pericholecystic fluid, or sonographic Murphy's sign. Common bile duct: Diameter: 3.9 mm Liver: No focal  lesion identified. Within normal limits in parenchymal echogenicity. Portal vein is patent on color Doppler imaging with normal direction of blood flow towards the liver. Other: None. IMPRESSION: Gallbladder wall thickening. No gallstones, pericholecystic fluid or sonographic Murphy's sign. If there is a high clinical concern for acute cholecystitis and further imaging is clinically indicated consider nuclear medicine hepatic biliary scan to assess patency of the cystic duct. Electronically Signed   By: Kerby Moors M.D.   On: 02/25/2022 06:58   CT ABDOMEN PELVIS W CONTRAST  Result Date: 02/24/2022 CLINICAL DATA:  Abdominal pain EXAM: CT ABDOMEN AND PELVIS WITH CONTRAST TECHNIQUE: Multidetector CT imaging of the abdomen and pelvis was performed using the standard protocol following bolus administration of intravenous contrast. RADIATION DOSE REDUCTION: This exam was performed according to the departmental dose-optimization program which includes automated exposure control, adjustment of the mA and/or kV according to patient size and/or use of iterative reconstruction technique. CONTRAST:  113m OMNIPAQUE IOHEXOL 350 MG/ML SOLN COMPARISON:  Noncontrast CT done on 12/10/2019 FINDINGS: Lower chest: Increased interstitial markings in the subpleural region may suggest scarring. There is small patchy infiltrate in the lingula in image 53 of series 5, there is 5 mm nodule in right middle lobe. This finding was not distinctly seen on the previous study. Coronary artery calcifications are seen. Hepatobiliary: No focal abnormalities are seen in liver. There is no dilation of bile ducts. There is enhancement in the wall of the gallbladder. There is fluid adjacent to the gallbladder. There is no significant dilation of bile ducts. Pancreas: No focal abnormalities are seen. Spleen: Unremarkable. Adrenals/Urinary Tract: Adrenals are unremarkable. There is no hydronephrosis. There are no renal or ureteral stones. There is  2.3 cm fluid density structure in the lower pole of left kidney consistent with renal cysts. There are few subcentimeter low-density lesions in the right renal cortex. No follow-up imaging is recommended. Ureters are unremarkable. Urinary bladder is unremarkable. Stomach/Bowel: Stomach is moderately distended. Small bowel loops are not dilated. Appendix is not seen. There is no focal pericecal inflammation. There is no significant focal wall thickening in colon. Scattered diverticula are seen without signs of focal acute diverticulitis. Vascular/Lymphatic: Atherosclerotic plaques and calcifications are seen in aorta and its major branches. Reproductive: Uterus is not seen. Other: There is no ascites or pneumoperitoneum. Umbilical hernia containing fat is seen. Musculoskeletal: Pectus excavatum deformity is noted. IMPRESSION: There is enhancement in the  wall of the gallbladder along with pericholecystic fluid. Possibility of acute cholecystitis is not excluded. Follow-up gallbladder sonogram may be considered. There is no evidence of intestinal obstruction or pneumoperitoneum. There is no hydronephrosis. Coronary artery calcifications are seen. Scarring in the periphery of lower lung fields may suggest chronic interstitial lung disease with fibrosis. Linear densities in lingula may suggest scarring or subsegmental atelectasis. There is 5 mm nodular density in right middle lobe. No follow-up needed if patient is low-risk.This recommendation follows the consensus statement: Guidelines for Management of Incidental Pulmonary Nodules Detected on CT Images: From the Fleischner Society 2017; Radiology 2017; 284:228-243. Diverticulosis of colon without signs of diverticulitis. Renal cysts. Other findings as described in the body of the report. Electronically Signed   By: Elmer Picker M.D.   On: 02/24/2022 15:34   CT Angio Chest Pulmonary Embolism (PE) W or WO Contrast  Result Date: 02/24/2022 CLINICAL DATA:   Positive D-dimer. EXAM: CT ANGIOGRAPHY CHEST WITH CONTRAST TECHNIQUE: Multidetector CT imaging of the chest was performed using the standard protocol during bolus administration of intravenous contrast. Multiplanar CT image reconstructions and MIPs were obtained to evaluate the vascular anatomy. RADIATION DOSE REDUCTION: This exam was performed according to the departmental dose-optimization program which includes automated exposure control, adjustment of the mA and/or kV according to patient size and/or use of iterative reconstruction technique. CONTRAST:  163m OMNIPAQUE IOHEXOL 350 MG/ML SOLN COMPARISON:  CT of the chest 08/08/2020 FINDINGS: Cardiovascular: Satisfactory opacification of the pulmonary arteries to the segmental level. No evidence of pulmonary embolism. Normal heart size. No pericardial effusion. There are atherosclerotic calcifications of the aorta. Mediastinum/Nodes: No enlarged mediastinal, hilar, or axillary lymph nodes. Thyroid gland, trachea, and esophagus demonstrate no significant findings. Lungs/Pleura: Mild emphysematous changes are again seen. There is a small amount of atelectasis in the lung bases. There is no new focal lung infiltrate, pleural effusion or pneumothorax. Upper Abdomen: No acute abnormality. Musculoskeletal: There is pectus deformity of the chest, unchanged. Degenerative changes are seen throughout the spine. There is vertebral body hemangioma at T5. Review of the MIP images confirms the above findings. IMPRESSION: 1. No evidence for pulmonary embolism. No acute cardiopulmonary process. 2. Aortic Atherosclerosis (ICD10-I70.0) and Emphysema (ICD10-J43.9). Electronically Signed   By: ARonney AstersM.D.   On: 02/24/2022 15:25   [place]Venous Img Lower Bilateral (DVT)  Result Date: 02/24/2022 CLINICAL DATA:  Elevated D-dimer EXAM: BILATERAL LOWER EXTREMITY VENOUS DOPPLER ULTRASOUND TECHNIQUE: Gray-scale sonography with graded compression, as well as color Doppler and duplex  ultrasound were performed to evaluate the lower extremity deep venous systems from the level of the common femoral vein and including the common femoral, femoral, profunda femoral, popliteal and calf veins including the posterior tibial, peroneal and gastrocnemius veins when visible. The superficial great saphenous vein was also interrogated. Spectral Doppler was utilized to evaluate flow at rest and with distal augmentation maneuvers in the common femoral, femoral and popliteal veins. COMPARISON:  None Available. FINDINGS: RIGHT LOWER EXTREMITY Common Femoral Vein: No evidence of thrombus. Normal compressibility, respiratory phasicity and response to augmentation. Saphenofemoral Junction: No evidence of thrombus. Normal compressibility and flow on color Doppler imaging. Profunda Femoral Vein: No evidence of thrombus. Normal compressibility and flow on color Doppler imaging. Femoral Vein: No evidence of thrombus. Normal compressibility, respiratory phasicity and response to augmentation. Popliteal Vein: No evidence of thrombus. Normal compressibility, respiratory phasicity and response to augmentation. Calf Veins: No evidence of thrombus. Normal compressibility and flow on color Doppler imaging. Superficial Great Saphenous  Vein: No evidence of thrombus. Normal compressibility. Venous Reflux:  None. Other Findings:  None. LEFT LOWER EXTREMITY Common Femoral Vein: No evidence of thrombus. Normal compressibility, respiratory phasicity and response to augmentation. Saphenofemoral Junction: No evidence of thrombus. Normal compressibility and flow on color Doppler imaging. Profunda Femoral Vein: No evidence of thrombus. Normal compressibility and flow on color Doppler imaging. Femoral Vein: No evidence of thrombus. Normal compressibility, respiratory phasicity and response to augmentation. Popliteal Vein: No evidence of thrombus. Normal compressibility, respiratory phasicity and response to augmentation. Calf Veins: No  evidence of thrombus. Normal compressibility and flow on color Doppler imaging. Superficial Great Saphenous Vein: No evidence of thrombus. Normal compressibility. Venous Reflux:  None. Other Findings:  None. IMPRESSION: No evidence of deep venous thrombosis in either lower extremity. Electronically Signed   By: Elmer Picker M.D.   On: 02/24/2022 13:10   CARDIAC CATHETERIZATION  Result Date: 02/24/2022   Prox RCA lesion is 10% stenosed.   Prox LAD to Mid LAD lesion is 30% stenosed.   The left ventricular systolic function is normal.   LV end diastolic pressure is moderately elevated.   The left ventricular ejection fraction is 55-65% by visual estimate. 1.  Mild nonobstructive coronary artery disease. 2.  Normal LV systolic function and moderately elevated left ventricular end-diastolic pressure. Recommendations: No culprit is identified for the patient's chest pain and mildly abnormal EKG. If patient's symptoms persist, recommend further work-up for noncardiac chest pain.   DG Chest Port 1 View  Result Date: 02/24/2022 CLINICAL DATA:  Chest pain or shortness of breath. EXAM: PORTABLE CHEST 1 VIEW COMPARISON:  PA Lat 10/11/2020. FINDINGS: There is mild cardiomegaly, mild perihilar vascular congestion and interstitial edema. Trace pleural effusions are beginning to form. No airspace infiltrate is seen. Mediastinal configuration is unchanged. There is mild aortic calcification and tortuosity. Translucent electrical pads overlie the left chest. There are multiple overlying monitor wires. There is mild thoracic dextroscoliosis with degenerative changes of the midthoracic spine. IMPRESSION: Perihilar vascular congestion and mild interstitial edema consistent with CHF or fluid overload. Progress chest films recommended depending on clinical response. Aortic atherosclerosis. Electronically Signed   By: Telford Nab M.D.   On: 02/24/2022 06:54    Cardiac Studies  LHC 02/24/22   Prox RCA lesion is 10%  stenosed.   Prox LAD to Mid LAD lesion is 30% stenosed.   The left ventricular systolic function is normal.   LV end diastolic pressure is moderately elevated.   The left ventricular ejection fraction is 55-65% by visual estimate.   1.  Mild nonobstructive coronary artery disease. 2.  Normal LV systolic function and moderately elevated left ventricular end-diastolic pressure.   Recommendations: No culprit is identified for the patient's chest pain and mildly abnormal EKG. If patient's symptoms persist, recommend further work-up for noncardiac chest pain.  Patient Profile     71 y.o. female with a history of essential hypertension, hyperlipidemia, hereditary thrombocytosis, tobacco use, and Psoriatic arthritis, who is being seen and elevated for chest pain and abnormal EKG.   Assessment & Plan    Chest pain with an abnormal EKG -Initially suspected inferior STEMI based on symptoms and EKG changes -Underwent left heart catheterization which revealed mild nonobstructive coronary artery disease -Normal LV systolic function -Recommendation was to further work-up noncardiac chest pain -Continue aspirin and statin therapy for secondary prevention  2.    Essential hypertension -Blood pressure 149/69 prior to any medications -Continue HCTZ 25 mg daily and losartan 25 mg daily -  Up titration of losartan if continued with elevated blood pressures -Blood pressure has been well controlled and soft in the early morning hours and is likely elevated currently due to pain -Vital signs per unit protocol  3.     Abdominal pain of unclear etiology -Lipase 25 amylase 36 LFTs WNL -As needed Zofran and morphine -CT of the abdomen revealed the possibility of acute cholecystitis and follow-up gallbladder sonogram, no evidence of intestinal obstruction, no hydronephrosis, 5 mm nodular density in the right middle lobe, diverticulosis of the colon without signs of diverticulitis, renal cyst -Ultrasound  gallbladder showed gallbladder wall thickening, no gallstones, if there is high clinical concern for acute cholecystitis and further imaging as clinically indicated considered nuclear medicine hepatobiliary scan to assess patency of cystic duct -Management per IM  4.     Hyperlipidemia -LDL of 120 on 02/24/2022 -Continue statin therapy     For questions or updates, please contact [place] Please consult [email or web address] for contact info under        Signed, Maddi Collar, NP  02/25/2022, 7:48 AM

## 2022-02-26 ENCOUNTER — Inpatient Hospital Stay: Payer: PPO

## 2022-02-26 ENCOUNTER — Encounter: Payer: Self-pay | Admitting: Internal Medicine

## 2022-02-26 DIAGNOSIS — R079 Chest pain, unspecified: Secondary | ICD-10-CM | POA: Diagnosis not present

## 2022-02-26 DIAGNOSIS — R1010 Upper abdominal pain, unspecified: Secondary | ICD-10-CM

## 2022-02-26 MED ORDER — HYDROMORPHONE HCL 1 MG/ML IJ SOLN
1.0000 mg | Freq: Once | INTRAMUSCULAR | Status: AC
  Administered 2022-02-26: 1 mg via INTRAVENOUS
  Filled 2022-02-26: qty 1

## 2022-02-26 MED ORDER — PANTOPRAZOLE SODIUM 40 MG PO TBEC: 40.0000 mg | DELAYED_RELEASE_TABLET | Freq: Two times a day (BID) | ORAL | 1 refills | Status: DC

## 2022-02-26 MED ORDER — NITROGLYCERIN 0.4 MG SL SUBL: 0.4000 mg | SUBLINGUAL_TABLET | SUBLINGUAL | 12 refills | Status: DC | PRN

## 2022-02-26 MED ORDER — TECHNETIUM TC 99M MEBROFENIN IV KIT
5.0000 | PACK | Freq: Once | INTRAVENOUS | Status: AC
  Administered 2022-02-26: 5.14 via INTRAVENOUS

## 2022-02-26 MED ORDER — ASPIRIN 81 MG PO CHEW: 81.0000 mg | CHEWABLE_TABLET | Freq: Every day | ORAL | 0 refills | Status: DC

## 2022-02-26 MED ORDER — AMLODIPINE BESYLATE 5 MG PO TABS: 5.0000 mg | ORAL_TABLET | Freq: Every day | ORAL | 0 refills | Status: DC

## 2022-02-26 MED ORDER — POTASSIUM CHLORIDE CRYS ER 10 MEQ PO TBCR: 20.0000 meq | EXTENDED_RELEASE_TABLET | Freq: Every day | ORAL | 0 refills | Status: DC

## 2022-02-26 NOTE — Progress Notes (Signed)
Pt off unit for scan and testing.

## 2022-02-26 NOTE — Progress Notes (Signed)
D/C instructions reviewed, paperwork given to pt, spouse at bedside. No distress noted.

## 2022-02-26 NOTE — Discharge Summary (Addendum)
Physician Discharge Summary   Patient: [place] Best MRN: [id number] DOB: 19-Aug-1950  Admit date:     02/24/2022  Discharge date: 02/26/22  Discharge Physician: Fritzi Mandes   PCP: Virginia Crews, MD   Recommendations at discharge:   patient to follow-up with PCP in 1 to 2 week follow-up with Dr. [name] as needed  Discharge Diagnoses: Principal Problem:   Chest pain of uncertain etiology Active Problems:   Essential hypertension   Hypercholesteremia   Upper abdominal pain   Current tobacco use   Chronic obstructive pulmonary disease ([place])   Depression with anxiety   Leukocytosis   RA (rheumatoid arthritis) ([place])   Lung nodule   Gastroesophageal reflux disease  Hospital Course: April Best is a 71 y.o. female with medical history significant of hypertension, hyperlipidemia, COPD, rheumatoid arthritis and psoriasis, GERD, depression with anxiety, panic disorder, hereditary hemochromatosis, tobacco abuse, alcohol abuse in remission, chronic pain, hypothyroidism, who presents with chest pain.   Chest pain with abnormal EKG showing ST elevation in inferior and frequently admitted -- patient underwent urgent cardiac cath found to have mild non-obstructive CAD -- continue aspirin and statins   Right upper quadrant pain radiating from the back along with acid reflux symptoms -- increased Protonix to BID -- abnormal ultrasound abdomen showed gallbladder thickening. No pedicle stick fluid or gallbladder sludge. -- Patient is appears to be frustrated with her symptoms --- HIDA scan-- negative -- G.I. consultation for possible  EGD--Dr Vicente Males -- recommends no urgent indication for any G.I. workup. Patient to follow-up with Dr. [name] as outpatient. Continue Protonix BID.   Hypertension -- continue Cozaar -- will will change hydrochlorothiazide to amlodipine to see if it helps with symptoms of possible diffuse esophageal spasm   Tobacco abuse -nicotine patch-    Hyperlipidemia -- Crestor   History of rheumatoid arthritis/chronic pain -- on PO prednisone -- follows with Dr. Posey Pronto at [facility]     Procedures: Cardiac cath Family communication :none Consults : cardiology, G.I. CODE STATUS: full DVT Prophylaxis : SCD    Pain control - [place] was reviewed. and patient was instructed, not to drive, operate heavy machinery, perform activities at heights, swimming or participation in water activities or provide baby-sitting services while on Pain, Sleep and Anxiety Medications; until their outpatient Physician has advised to do so again. Also recommended to not to take more than prescribed Pain, Sleep and Anxiety Medications.   Disposition: Home Diet recommendation:  Discharge Diet Orders (From admission, onward)     Start     Ordered   02/26/22 0000  Diet - low sodium heart healthy        02/26/22 1552           Cardiac diet DISCHARGE MEDICATION: Allergies as of 02/26/2022       Reactions   Enbrel [etanercept] Hives   Other reaction(s): Muscle Cramps   Modafinil Rash   Augmentin  [amoxicillin-pot Clavulanate] Nausea And Vomiting   as stated (XR).   Cefdinir Diarrhea, Other (See Comments)   Other reaction(s): GI intolerance, Other (see comments) Gives her Cdiff Gives her Cdiff   Lisinopril Cough        Medication List     STOP taking these medications    hydrochlorothiazide 25 MG tablet Commonly known as: HYDRODIURIL   omeprazole 20 MG capsule Commonly known as: PRILOSEC Replaced by: pantoprazole 40 MG tablet       TAKE these medications    amLODipine  5 MG tablet Commonly known as: NORVASC Take 1 tablet (5 mg total) by mouth daily. Start taking on: February 27, 2022   aspirin 81 MG chewable tablet Chew 1 tablet (81 mg total) by mouth daily. Start taking on: February 27, 2022   azelastine 0.1 % nasal spray Commonly known as: ASTELIN Place 1 spray into  both nostrils daily as needed.   Breztri Aerosphere 160-9-4.8 MCG/ACT Aero Generic drug: Budeson-Glycopyrrol-Formoterol Inhale 2 puffs into the lungs 2 (two) times daily.   clobetasol ointment 0.05 % Commonly known as: TEMOVATE APP TOPICALLY TO GLUTEAL AREA ONCE D PRF DERMATITIS   COLLAGEN PO Take 30 mLs by mouth.   diclofenac Sodium 1 % Gel Commonly known as: VOLTAREN Apply 2 g topically 4 (four) times daily.   DULoxetine 20 MG capsule Commonly known as: CYMBALTA Cymbalta 20 mg capsule,delayed release  Take 1 capsule twice a day by oral route.   escitalopram 20 MG tablet Commonly known as: LEXAPRO TAKE ONE TABLET BY MOUTH EVERY DAY   fluticasone 50 MCG/ACT nasal spray Commonly known as: FLONASE Place 2 sprays into both nostrils daily.   gabapentin 300 MG capsule Commonly known as: NEURONTIN Take 300 mg by mouth as needed (qhs). PRN   gabapentin 100 MG capsule Commonly known as: NEURONTIN Take 100 mg by mouth 2 (two) times daily as needed.   gentamicin ointment 0.1 % Commonly known as: GARAMYCIN Apply 1 application. topically 3 (three) times daily.   hydrocortisone 2.5 % ointment Apply topically 2 (two) times daily.   loratadine 10 MG tablet Commonly known as: CLARITIN Take 10 mg by mouth daily.   losartan 25 MG tablet Commonly known as: COZAAR Take 25 mg by mouth daily.   methocarbamol 750 MG tablet Commonly known as: ROBAXIN methocarbamol 750 mg tablet  Take 1 tablet 3 times a day by oral route for 30 days.   nitroGLYCERIN 0.4 MG SL tablet Commonly known as: NITROSTAT Place 1 tablet (0.4 mg total) under the tongue every 5 (five) minutes as needed for chest pain.   ondansetron 4 MG tablet Commonly known as: ZOFRAN TAKE ONE TABLET BY MOUTH EVERY EIGHT HOURS AS NEEDED FOR NAUSEA / VOMITING   oxyCODONE-acetaminophen 10-325 MG tablet Commonly known as: PERCOCET Take 1 tablet by mouth every 4 (four) hours as needed for pain.   pantoprazole 40 MG  tablet Commonly known as: PROTONIX Take 1 tablet (40 mg total) by mouth 2 (two) times daily before a meal. Replaces: omeprazole 20 MG capsule   potassium chloride 10 MEQ tablet Commonly known as: KLOR-CON M Take 2 tablets (20 mEq total) by mouth daily. What changed: when to take this   predniSONE 10 MG tablet Commonly known as: DELTASONE Take 1 tablet by mouth daily.   rosuvastatin 5 MG tablet Commonly known as: CRESTOR TAKE 1 TABLET BY MOUTH DAILY   traZODone 100 MG tablet Commonly known as: DESYREL Take 0.5-1 tablets (50-100 mg total) by mouth at bedtime.   triamcinolone cream 0.1 % Commonly known as: KENALOG Apply topically 2 (two) times daily as needed.        Follow-up Information     Bacigalupo, Dionne Bucy, MD. Schedule an appointment as soon as possible for a visit in 1 week(s).   Specialty: Family Medicine Contact information: [address] [place] [address] [place] [address] [phone]         Efrain Sella, MD. Schedule an appointment as soon as possible for a visit.   Specialty: Gastroenterology Why: As  needed, If symptoms worsen Contact information: [place] [address] [phone]                Discharge Exam: Filed Weights   02/24/22 6301 02/24/22 0624 02/24/22 0733  Weight: 52.2 kg 54 kg 55.9 kg     Condition at discharge: fair  The results of significant diagnostics from this hospitalization (including imaging, microbiology, ancillary and laboratory) are listed below for reference.   Imaging Studies: NM Hepato W/EF  Result Date: 02/26/2022 CLINICAL DATA:  Abdominal pain. EXAM: NUCLEAR MEDICINE HEPATOBILIARY IMAGING WITH GALLBLADDER EF TECHNIQUE: Sequential images of the abdomen were obtained out to 60 minutes following intravenous administration of radiopharmaceutical. After oral ingestion of Ensure, gallbladder ejection fraction was determined. At 60 min, normal ejection fraction is greater than 33%.  RADIOPHARMACEUTICALS:  5.14 mCi Tc-41m Choletec IV COMPARISON:  Abdominal sonogram 02/25/2022 FINDINGS: Prompt uptake and biliary excretion of activity by the liver is seen. Gallbladder activity is visualized, consistent with patency of cystic duct. Biliary activity passes into small bowel, consistent with patent common bile duct. Calculated gallbladder ejection fraction is 77%. (Normal gallbladder ejection fraction with Ensure is greater than 33%.) IMPRESSION: 1. Patent cystic duct without evidence for acute cholecystitis. 2. Normal gallbladder ejection fraction. Electronically Signed   By: TKerby MoorsM.D.   On: 02/26/2022 11:11   ECHOCARDIOGRAM COMPLETE  Result Date: 02/25/2022    ECHOCARDIOGRAM REPORT   Patient Name:   [facility]2022 Medical Rec #:  [id number]       Height:       59.0 in Accession #:    [id number]      Weight:       123.2 lb Date of Birth:  606-08-1950       BSA:          1.501 m Patient Age:    78years         BP:           177/76 mmHg Patient Gender: F                HR:           61 bpm. Exam Location:  [facility] Procedure: 2D Echo and Intracardiac Opacification Agent Indications:     Chest Pain  History:         Patient has no prior history of Echocardiogram examinations.                  Risk Factors:Dyslipidemia and Diabetes.  Sonographer:     L. Thornton-Maynard Referring Phys:  [id number] Phys: MKathlyn SacramentoMD  Sonographer Comments: Suboptimal parasternal window. IMPRESSIONS  1. Left ventricular ejection fraction, by estimation, is 60 to 65%. The left ventricle has normal function. The left ventricle has no regional wall motion abnormalities. Left ventricular diastolic parameters were normal.  2. Right ventricular systolic function is normal. The right ventricular size is normal. Tricuspid regurgitation signal is inadequate for assessing PA pressure.  3. The mitral valve is normal in structure. No evidence of mitral valve  regurgitation. No evidence of mitral stenosis.  4. The aortic valve is normal in structure. Aortic valve regurgitation is not visualized. No aortic stenosis is present.  5. The inferior vena cava is normal in size with greater than 50% respiratory variability, suggesting right atrial pressure of 3 mmHg. FINDINGS  Left Ventricle: Left ventricular ejection fraction, by estimation, is 60 to  65%. The left ventricle has normal function. The left ventricle has no regional wall motion abnormalities. Definity contrast agent was given IV to delineate the left ventricular  endocardial borders. The left ventricular internal cavity size was normal in size. There is no left ventricular hypertrophy. Left ventricular diastolic parameters were normal. Right Ventricle: The right ventricular size is normal. No increase in right ventricular wall thickness. Right ventricular systolic function is normal. Tricuspid regurgitation signal is inadequate for assessing PA pressure. Left Atrium: Left atrial size was normal in size. Right Atrium: Right atrial size was normal in size. Pericardium: There is no evidence of pericardial effusion. Mitral Valve: The mitral valve is normal in structure. No evidence of mitral valve regurgitation. No evidence of mitral valve stenosis. Tricuspid Valve: The tricuspid valve is normal in structure. Tricuspid valve regurgitation is not demonstrated. No evidence of tricuspid stenosis. Aortic Valve: The aortic valve is normal in structure. Aortic valve regurgitation is not visualized. No aortic stenosis is present. Aortic valve mean gradient measures 6.0 mmHg. Aortic valve peak gradient measures 12.5 mmHg. Aortic valve area, by VTI measures 2.15 cm. Pulmonic Valve: The pulmonic valve was normal in structure. Pulmonic valve regurgitation is not visualized. No evidence of pulmonic stenosis. Aorta: The aortic root is normal in size and structure. Venous: The inferior vena cava is normal in size with greater than  50% respiratory variability, suggesting right atrial pressure of 3 mmHg. IAS/Shunts: No atrial level shunt detected by color flow Doppler.  LEFT VENTRICLE PLAX 2D LVIDd:         3.56 cm     Diastology LVIDs:         2.44 cm     LV e' medial:    7.40 cm/s LV PW:         0.98 cm     LV E/e' medial:  9.9 LV IVS:        0.94 cm     LV e' lateral:   8.16 cm/s LVOT diam:     2.10 cm     LV E/e' lateral: 9.0 LV SV:         74 LV SV Index:   49 LVOT Area:     3.46 cm  LV Volumes (MOD) LV vol d, MOD A2C: 54.7 ml LV vol d, MOD A4C: 67.9 ml LV vol s, MOD A2C: 25.0 ml LV vol s, MOD A4C: 32.9 ml LV SV MOD A2C:     29.7 ml LV SV MOD A4C:     67.9 ml LV SV MOD BP:      33.7 ml RIGHT VENTRICLE RV S prime:     15.10 cm/s TAPSE (M-mode): 2.3 cm LEFT ATRIUM             Index        RIGHT ATRIUM           Index LA Vol (A2C):   46.5 ml 30.97 ml/m  RA Area:     15.70 cm LA Vol (A4C):   44.5 ml 29.64 ml/m  RA Volume:   39.60 ml  26.38 ml/m LA Biplane Vol: 50.0 ml 33.30 ml/m  AORTIC VALVE                     PULMONIC VALVE AV Area (Vmax):    2.07 cm      PV Vmax:       0.89 m/s AV Area (Vmean):   2.20 cm      PV  Peak grad:  3.2 mmHg AV Area (VTI):     2.15 cm AV Vmax:           177.00 cm/s AV Vmean:          115.000 cm/s AV VTI:            0.343 m AV Peak Grad:      12.5 mmHg AV Mean Grad:      6.0 mmHg LVOT Vmax:         106.00 cm/s LVOT Vmean:        72.900 cm/s LVOT VTI:          0.213 m LVOT/AV VTI ratio: 0.62  AORTA Ao Root diam: 3.00 cm MITRAL VALVE MV Area (PHT): 3.03 cm    SHUNTS MV Decel Time: 250 msec    Systemic VTI:  0.21 m MV E velocity: 73.30 cm/s  Systemic Diam: 2.10 cm MV A velocity: 64.30 cm/s MV E/A ratio:  1.14 Kathlyn Sacramento MD Electronically signed by Kathlyn Sacramento MD Signature Date/Time: 02/25/2022/10:59:25 AM    Final    US ABDOMEN LIMITED RUQ (LIVER/GB)  Result Date: 02/25/2022 CLINICAL DATA:  Pain EXAM: ULTRASOUND ABDOMEN LIMITED RIGHT UPPER QUADRANT COMPARISON:  02/24/2022 FINDINGS: Gallbladder:  Gallbladder wall appears thickened measuring 4.3 mm. No gallstones, pericholecystic fluid, or sonographic Murphy's sign. Common bile duct: Diameter: 3.9 mm Liver: No focal lesion identified. Within normal limits in parenchymal echogenicity. Portal vein is patent on color Doppler imaging with normal direction of blood flow towards the liver. Other: None. IMPRESSION: Gallbladder wall thickening. No gallstones, pericholecystic fluid or sonographic Murphy's sign. If there is a high clinical concern for acute cholecystitis and further imaging is clinically indicated consider nuclear medicine hepatic biliary scan to assess patency of the cystic duct. Electronically Signed   By: Kerby Moors M.D.   On: 02/25/2022 06:58   CT ABDOMEN PELVIS W CONTRAST  Result Date: 02/24/2022 CLINICAL DATA:  Abdominal pain EXAM: CT ABDOMEN AND PELVIS WITH CONTRAST TECHNIQUE: Multidetector CT imaging of the abdomen and pelvis was performed using the standard protocol following bolus administration of intravenous contrast. RADIATION DOSE REDUCTION: This exam was performed according to the departmental dose-optimization program which includes automated exposure control, adjustment of the mA and/or kV according to patient size and/or use of iterative reconstruction technique. CONTRAST:  122m OMNIPAQUE IOHEXOL 350 MG/ML SOLN COMPARISON:  Noncontrast CT done on 12/10/2019 FINDINGS: Lower chest: Increased interstitial markings in the subpleural region may suggest scarring. There is small patchy infiltrate in the lingula in image 53 of series 5, there is 5 mm nodule in right middle lobe. This finding was not distinctly seen on the previous study. Coronary artery calcifications are seen. Hepatobiliary: No focal abnormalities are seen in liver. There is no dilation of bile ducts. There is enhancement in the wall of the gallbladder. There is fluid adjacent to the gallbladder. There is no significant dilation of bile ducts. Pancreas: No focal  abnormalities are seen. Spleen: Unremarkable. Adrenals/Urinary Tract: Adrenals are unremarkable. There is no hydronephrosis. There are no renal or ureteral stones. There is 2.3 cm fluid density structure in the lower pole of left kidney consistent with renal cysts. There are few subcentimeter low-density lesions in the right renal cortex. No follow-up imaging is recommended. Ureters are unremarkable. Urinary bladder is unremarkable. Stomach/Bowel: Stomach is moderately distended. Small bowel loops are not dilated. Appendix is not seen. There is no focal pericecal inflammation. There is no significant focal wall thickening in colon. Scattered diverticula  are seen without signs of focal acute diverticulitis. Vascular/Lymphatic: Atherosclerotic plaques and calcifications are seen in aorta and its major branches. Reproductive: Uterus is not seen. Other: There is no ascites or pneumoperitoneum. Umbilical hernia containing fat is seen. Musculoskeletal: Pectus excavatum deformity is noted. IMPRESSION: There is enhancement in the wall of the gallbladder along with pericholecystic fluid. Possibility of acute cholecystitis is not excluded. Follow-up gallbladder sonogram may be considered. There is no evidence of intestinal obstruction or pneumoperitoneum. There is no hydronephrosis. Coronary artery calcifications are seen. Scarring in the periphery of lower lung fields may suggest chronic interstitial lung disease with fibrosis. Linear densities in lingula may suggest scarring or subsegmental atelectasis. There is 5 mm nodular density in right middle lobe. No follow-up needed if patient is low-risk.This recommendation follows the consensus statement: Guidelines for Management of Incidental Pulmonary Nodules Detected on CT Images: From the Fleischner Society 2017; Radiology 2017; 284:228-243. Diverticulosis of colon without signs of diverticulitis. Renal cysts. Other findings as described in the body of the report.  Electronically Signed   By: Elmer Picker M.D.   On: 02/24/2022 15:34   CT Angio Chest Pulmonary Embolism (PE) W or WO Contrast  Result Date: 02/24/2022 CLINICAL DATA:  Positive D-dimer. EXAM: CT ANGIOGRAPHY CHEST WITH CONTRAST TECHNIQUE: Multidetector CT imaging of the chest was performed using the standard protocol during bolus administration of intravenous contrast. Multiplanar CT image reconstructions and MIPs were obtained to evaluate the vascular anatomy. RADIATION DOSE REDUCTION: This exam was performed according to the departmental dose-optimization program which includes automated exposure control, adjustment of the mA and/or kV according to patient size and/or use of iterative reconstruction technique. CONTRAST:  154m OMNIPAQUE IOHEXOL 350 MG/ML SOLN COMPARISON:  CT of the chest 08/08/2020 FINDINGS: Cardiovascular: Satisfactory opacification of the pulmonary arteries to the segmental level. No evidence of pulmonary embolism. Normal heart size. No pericardial effusion. There are atherosclerotic calcifications of the aorta. Mediastinum/Nodes: No enlarged mediastinal, hilar, or axillary lymph nodes. Thyroid gland, trachea, and esophagus demonstrate no significant findings. Lungs/Pleura: Mild emphysematous changes are again seen. There is a small amount of atelectasis in the lung bases. There is no new focal lung infiltrate, pleural effusion or pneumothorax. Upper Abdomen: No acute abnormality. Musculoskeletal: There is pectus deformity of the chest, unchanged. Degenerative changes are seen throughout the spine. There is vertebral body hemangioma at T5. Review of the MIP images confirms the above findings. IMPRESSION: 1. No evidence for pulmonary embolism. No acute cardiopulmonary process. 2. Aortic Atherosclerosis (ICD10-I70.0) and Emphysema (ICD10-J43.9). Electronically Signed   By: ARonney AstersM.D.   On: 02/24/2022 15:25   UKoreaVenous Img Lower Bilateral (DVT)  Result Date:  02/24/2022 CLINICAL DATA:  Elevated D-dimer EXAM: BILATERAL LOWER EXTREMITY VENOUS DOPPLER ULTRASOUND TECHNIQUE: Gray-scale sonography with graded compression, as well as color Doppler and duplex ultrasound were performed to evaluate the lower extremity deep venous systems from the level of the common femoral vein and including the common femoral, femoral, profunda femoral, popliteal and calf veins including the posterior tibial, peroneal and gastrocnemius veins when visible. The superficial great saphenous vein was also interrogated. Spectral Doppler was utilized to evaluate flow at rest and with distal augmentation maneuvers in the common femoral, femoral and popliteal veins. COMPARISON:  None Available. FINDINGS: RIGHT LOWER EXTREMITY Common Femoral Vein: No evidence of thrombus. Normal compressibility, respiratory phasicity and response to augmentation. Saphenofemoral Junction: No evidence of thrombus. Normal compressibility and flow on color Doppler imaging. Profunda Femoral Vein: No evidence of thrombus. Normal compressibility  and flow on color Doppler imaging. Femoral Vein: No evidence of thrombus. Normal compressibility, respiratory phasicity and response to augmentation. Popliteal Vein: No evidence of thrombus. Normal compressibility, respiratory phasicity and response to augmentation. Calf Veins: No evidence of thrombus. Normal compressibility and flow on color Doppler imaging. Superficial Great Saphenous Vein: No evidence of thrombus. Normal compressibility. Venous Reflux:  None. Other Findings:  None. LEFT LOWER EXTREMITY Common Femoral Vein: No evidence of thrombus. Normal compressibility, respiratory phasicity and response to augmentation. Saphenofemoral Junction: No evidence of thrombus. Normal compressibility and flow on color Doppler imaging. Profunda Femoral Vein: No evidence of thrombus. Normal compressibility and flow on color Doppler imaging. Femoral Vein: No evidence of thrombus. Normal  compressibility, respiratory phasicity and response to augmentation. Popliteal Vein: No evidence of thrombus. Normal compressibility, respiratory phasicity and response to augmentation. Calf Veins: No evidence of thrombus. Normal compressibility and flow on color Doppler imaging. Superficial Great Saphenous Vein: No evidence of thrombus. Normal compressibility. Venous Reflux:  None. Other Findings:  None. IMPRESSION: No evidence of deep venous thrombosis in either lower extremity. Electronically Signed   By: Elmer Picker M.D.   On: 02/24/2022 13:10   CARDIAC CATHETERIZATION  Result Date: 02/24/2022   Prox RCA lesion is 10% stenosed.   Prox LAD to Mid LAD lesion is 30% stenosed.   The left ventricular systolic function is normal.   LV end diastolic pressure is moderately elevated.   The left ventricular ejection fraction is 55-65% by visual estimate. 1.  Mild nonobstructive coronary artery disease. 2.  Normal LV systolic function and moderately elevated left ventricular end-diastolic pressure. Recommendations: No culprit is identified for the patient's chest pain and mildly abnormal EKG. If patient's symptoms persist, recommend further work-up for noncardiac chest pain.   DG Chest Port 1 View  Result Date: 02/24/2022 CLINICAL DATA:  Chest pain or shortness of breath. EXAM: PORTABLE CHEST 1 VIEW COMPARISON:  PA Lat 10/11/2020. FINDINGS: There is mild cardiomegaly, mild perihilar vascular congestion and interstitial edema. Trace pleural effusions are beginning to form. No airspace infiltrate is seen. Mediastinal configuration is unchanged. There is mild aortic calcification and tortuosity. Translucent electrical pads overlie the left chest. There are multiple overlying monitor wires. There is mild thoracic dextroscoliosis with degenerative changes of the midthoracic spine. IMPRESSION: Perihilar vascular congestion and mild interstitial edema consistent with CHF or fluid overload. Progress chest films  recommended depending on clinical response. Aortic atherosclerosis. Electronically Signed   By: Telford Nab M.D.   On: 02/24/2022 06:54    Microbiology: Results for orders placed or performed during the hospital encounter of 02/24/22  Resp Panel by RT-PCR (Flu A&B, Covid) Anterior Nasal Swab     Status: None   Collection Time: 02/24/22  6:17 AM   Specimen: Anterior Nasal Swab  Result Value Ref Range Status   SARS Coronavirus 2 by RT PCR NEGATIVE NEGATIVE Final    Comment: (NOTE) SARS-CoV-2 target nucleic acids are NOT DETECTED.  The SARS-CoV-2 RNA is generally detectable in upper respiratory specimens during the acute phase of infection. The lowest concentration of SARS-CoV-2 viral copies this assay can detect is 138 copies/mL. A negative result does not preclude SARS-Cov-2 infection and should not be used as the sole basis for treatment or other patient management decisions. A negative result may occur with  improper specimen collection/handling, submission of specimen other than nasopharyngeal swab, presence of viral mutation(s) within the areas targeted by this assay, and inadequate number of viral copies(<138 copies/mL). A negative result must  be combined with clinical observations, patient history, and epidemiological information. The expected result is Negative.  Fact Sheet for Patients:  EntrepreneurPulse.com.au  Fact Sheet for Healthcare Providers:  IncredibleEmployment.be  This test is no t yet approved or cleared by the [place] FDA and  has been authorized for detection and/or diagnosis of SARS-CoV-2 by FDA under an Emergency Use Authorization (EUA). This EUA will remain  in effect (meaning this test can be used) for the duration of the COVID-19 declaration under Section 564(b)(1) of the Act, 21 U.S.C.section 360bbb-3(b)(1), unless the authorization is terminated  or revoked sooner.       Influenza A by PCR NEGATIVE  NEGATIVE Final   Influenza B by PCR NEGATIVE NEGATIVE Final    Comment: (NOTE) The Xpert Xpress SARS-CoV-2/FLU/RSV plus assay is intended as an aid in the diagnosis of influenza from Nasopharyngeal swab specimens and should not be used as a sole basis for treatment. Nasal washings and aspirates are unacceptable for Xpert Xpress SARS-CoV-2/FLU/RSV testing.  Fact Sheet for Patients: EntrepreneurPulse.com.au  Fact Sheet for Healthcare Providers: IncredibleEmployment.be  This test is not yet approved or cleared by the [place] FDA and has been authorized for detection and/or diagnosis of SARS-CoV-2 by FDA under an Emergency Use Authorization (EUA). This EUA will remain in effect (meaning this test can be used) for the duration of the COVID-19 declaration under Section 564(b)(1) of the Act, 21 U.S.C. section 360bbb-3(b)(1), unless the authorization is terminated or revoked.  Performed at [facility], [address] [place], [place] [address]   MRSA Next Gen by PCR, Nasal     Status: None   Collection Time: 02/24/22  7:46 AM   Specimen: Nasal Mucosa; Nasal Swab  Result Value Ref Range Status   MRSA by PCR Next Gen NOT DETECTED NOT DETECTED Final    Comment: (NOTE) The GeneXpert MRSA Assay (FDA approved for NASAL specimens only), is one component of a comprehensive MRSA colonization surveillance program. It is not intended to diagnose MRSA infection nor to guide or monitor treatment for MRSA infections. Test performance is not FDA approved in patients less than 69 years old. Performed at [facility], [address] [place], [place] [address]     Labs: CBC: Recent Labs  Lab 02/24/22 0617  WBC 10.7*  NEUTROABS 5.3  HGB 15.0  HCT 47.1*  MCV 100.9*  PLT 010   Basic Metabolic Panel: Recent Labs  Lab 02/24/22 0617  NA 140  K 3.5  CL 103  CO2 30  GLUCOSE 131*  BUN 13  CREATININE 0.68  CALCIUM 8.8*    Liver Function Tests: Recent Labs  Lab 02/24/22 0617  AST 19  ALT 15  ALKPHOS 48  BILITOT 0.4  PROT 6.5  ALBUMIN 3.9   CBG: Recent Labs  Lab 02/24/22 0738 02/25/22 0736 02/25/22 1059 02/25/22 1515  GLUCAP 109* 95 96 185*    Discharge time spent: greater than 30 minutes.  Signed: Fritzi Mandes, MD [facility] 02/26/2022

## 2022-02-26 NOTE — Consult Note (Signed)
Jonathon Bellows , MD [address], [place], [place], [place], [address] [address] [place], [place], [place], [address] Phone: [phone]  Fax: [phone]  Consultation  Referring Provider:  Dr Posey Pronto  Primary Care Physician:  Virginia Crews, MD Primary Gastroenterologist:  Dr. [name]          Reason for Consultation:     Abdominal pain   Date of Admission:  02/24/2022 Date of Consultation:  02/26/2022         HPI:   April Best is a 71 y.o. female follows with [facility] clinic gastroenterology history of C. difficile diarrhea back in 2021, early thrombocytosis hyperlipidemia.  EGD in 2008 showed normal esophagus normal duodenum.  History of rheumatoid arthritis.  Admitted to hospital on 02/24/2022 with chest pain on the right side of her chest  There is concern for STEMI status post cardiac catheterization, no lesion was found to explain the chest pain she had a CT scan of the abdomen that showed enhancement in the wall of the gallbladder along with pericholecystic fluid otherwise stomach is moderately distended no significant wall thickening in the colon no evidence of diverticulitis.  Right upper quadrant ultrasound was performed subsequently showed gallbladder wall thickening no gallstones.  She is undergoing a HIDA scan.  I have been consulted to perform an EGD to evaluate for the abdominal pain/chest pain. She states that she has had right-sided abdominal pain for many months.  Worse when she lies in certain positions.  Associated with numbness in her feet sometimes numbness in her arms.  She states she has a history of osteoporosis.  When asked to point out the location of the pain she pointed towards the right lower ribs and back.  No relation to food intake.  No relation to bowel movements.  02/24/2022 lipase, amylase were normal.  CMP showed no elevated liver function tests creatinine was 0.68 and hemoglobin is 15 g.   Past Medical History:  Diagnosis Date    Allergic rhinitis    Allergy    Annular oral lichen planus    Anxiety    Arthritis    Depression    Deviated nasal septum    nasal obstruction   Guaiac positive stools    history   H. 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   ABDOMINOPLASTY     APPENDECTOMY     BREAST BIOPSY Right    neg- core   COLONOSCOPY  03/2006   COLONOSCOPY WITH PROPOFOL N/A 11/19/2017   Procedure: COLONOSCOPY WITH PROPOFOL;  Surgeon: Toledo, Benay Pike, MD;  Location: [facility] ENDOSCOPY;  Service: Gastroenterology;  Laterality: N/A;   CORONARY/GRAFT ACUTE MI REVASCULARIZATION N/A 02/24/2022   Procedure: Coronary/Graft Acute MI Revascularization;  Surgeon: Wellington Hampshire, MD;  Location: [facility];  Service: Cardiovascular;  Laterality: N/A;   ENDOSCOPIC CONCHA BULLOSA RESECTION Right 01/30/2021   Procedure: ENDOSCOPIC CONCHA BULLOSA RESECTION;  Surgeon: Clyde Canterbury, MD;  Location: [facility];  Service: ENT;  Laterality: Right;   LEFT  HEART CATH AND CORONARY ANGIOGRAPHY N/A 02/24/2022   Procedure: LEFT HEART CATH AND CORONARY ANGIOGRAPHY;  Surgeon: Wellington Hampshire, MD;  Location: [facility] [place] [facility];  Service: Cardiovascular;  Laterality: N/A;   RHINOPLASTY  2002   SEPTOPLASTY N/A 01/30/2021   Procedure: SEPTOPLASTY;  Surgeon: Clyde Canterbury, MD;  Location: [facility];  Service: ENT;  Laterality: N/A;   UPPER GI ENDOSCOPY  2008    Prior to Admission medications   Medication Sig Start Date End Date Taking? Authorizing Provider  azelastine (ASTELIN) 0.1 % nasal spray Place 1 spray into both nostrils daily as needed.    Yes [provider]  clobetasol ointment (TEMOVATE) 0.05 % APP TOPICALLY TO GLUTEAL AREA ONCE D PRF DERMATITIS 10/07/18  Yes Pollak, Adriana M, PA-C  COLLAGEN PO Take 30 mLs by mouth.   Yes [provider]  diclofenac Sodium (VOLTAREN) 1 % GEL Apply 2 g topically 4 (four) times daily. 09/07/20  Yes [provider]  DULoxetine (CYMBALTA) 20 MG capsule Cymbalta 20 mg capsule,delayed release  Take 1 capsule twice a day by oral route.   Yes [provider]  escitalopram (LEXAPRO) 20 MG tablet TAKE ONE TABLET BY MOUTH EVERY DAY 12/17/21  Yes Bacigalupo, Dionne Bucy, MD  gabapentin (NEURONTIN) 100 MG capsule Take 100 mg by mouth 2 (two) times daily as needed. 11/05/19  Yes [provider]  gabapentin (NEURONTIN) 300 MG capsule Take 300 mg by mouth as needed (qhs). PRN   Yes [provider]  gentamicin ointment (GARAMYCIN) 0.1 % Apply 1 application. topically 3 (three) times daily.   Yes [provider]  hydrochlorothiazide (HYDRODIURIL) 25 MG tablet TAKE 1 TABLET BY MOUTH DAILY 09/13/21  Yes Bacigalupo, Dionne Bucy, MD  hydrocortisone 2.5 % ointment Apply topically 2 (two) times daily. 03/29/21  Yes [provider]  loratadine (CLARITIN) 10 MG tablet Take 10 mg by mouth daily.   Yes [provider]  losartan (COZAAR) 25 MG tablet Take 25 mg by mouth daily. 12/31/19  Yes [provider]  methocarbamol (ROBAXIN) 750 MG tablet methocarbamol 750 mg tablet  Take 1 tablet 3 times a day by oral route for 30 days.   Yes [provider]  omeprazole (PRILOSEC) 20 MG capsule Take 1 capsule (20 mg total) by mouth 2 (two) times daily before a meal. 04/05/21  Yes Gwyneth Sprout, FNP  ondansetron (ZOFRAN) 4 MG tablet TAKE ONE TABLET BY MOUTH EVERY EIGHT HOURS AS NEEDED FOR NAUSEA / VOMITING 04/07/20  Yes Carles Collet M, PA-C  oxyCODONE-acetaminophen (PERCOCET) 10-325 MG tablet Take 1 tablet by mouth every 4 (four) hours as needed  for pain.   Yes [provider]  potassium chloride (KLOR-CON) 10 MEQ tablet Take 2 tablets (20 mEq total) by mouth 2 (two) times daily. 04/23/21  Yes Gwyneth Sprout, FNP  predniSONE (DELTASONE) 10 MG tablet Take 1 tablet by mouth daily. 06/07/21  Yes [provider]  rosuvastatin (CRESTOR) 5 MG tablet TAKE 1 TABLET BY MOUTH DAILY 11/09/21  Yes Bacigalupo, Dionne Bucy, MD  traZODone (DESYREL) 100 MG tablet Take 0.5-1 tablets (50-100 mg total) by mouth at bedtime. 09/14/21  Yes Bacigalupo, Dionne Bucy, MD  triamcinolone cream (KENALOG) 0.1 % Apply topically 2 (two) times daily as needed. 04/02/21  Yes [provider]  Budeson-Glycopyrrol-Formoterol (BREZTRI AEROSPHERE) 160-9-4.8 MCG/ACT AERO Inhale 2 puffs into the lungs 2 (two) times daily. Patient not taking: Reported on 02/24/2022 10/17/21   Gwyneth Sprout, FNP  fluticasone (FLONASE) 50 MCG/ACT nasal spray Place 2 sprays into both nostrils daily. 05/07/21   [provider]    Family History  Problem Relation Age of Onset   Hypertension Mother    Hyperlipidemia Mother    Hypothyroidism Mother    Fibromyalgia Mother    Hearing loss Mother    Lung cancer Father    Coronary artery disease Father    Hyperlipidemia Father    Hypertension Father    Heart attack Father    Hearing loss Sister    Hyperlipidemia Sister    Hypertension Sister    Fibromyalgia Sister    Hemochromatosis Sister    ADD / ADHD Brother    Breast cancer Neg Hx      Social History   Tobacco Use   Smoking status: Every Day    Packs/day: 1.00    Years: 49.00    Total pack years: 49.00    Types: Cigarettes   Smokeless tobacco: Never   Tobacco comments:    pt declines smoking cessation ( smoked since age 42)  Vaping Use   Vaping Use: Never used  Substance Use Topics   Alcohol use: Not Currently    Alcohol/week: 0.0 standard drinks of alcohol   Drug use: No    Allergies as of 02/24/2022 - Review Complete 02/24/2022  Allergen  Reaction Noted   Enbrel [etanercept] Hives 06/17/2020   Modafinil Rash 04/19/2021   Augmentin  [amoxicillin-pot clavulanate] Nausea And Vomiting 11/02/2014   Cefdinir Diarrhea and Other (See Comments) 01/19/2020   Lisinopril Cough 01/04/2015    Review of Systems:    All systems reviewed and negative except where noted in HPI.   Physical Exam:  Vital signs in last 24 hours: Temp:  [97.8 F (36.6 C)-99 F (37.2 C)] 98.4 F (36.9 C) (09/12 0400) Pulse Rate:  [63-89] 72 (09/12 0600) Resp:  [12-46] 14 (09/12 0800) BP: (117-139)/(59-70) 117/70 (09/12 0800) SpO2:  [90 %-96 %] 91 % (09/12 0600) Last BM Date : 02/25/22 General:   Pleasant, cooperative in NAD Head:  Normocephalic and atraumatic. Eyes:   No icterus.   Conjunctiva pink. PERRLA. Ears:  Normal auditory acuity. Neck:  Supple; no masses or thyroidomegaly Lungs: Respirations even and unlabored. Lungs clear to auscultation bilaterally.   No wheezes, crackles, or rhonchi.  Heart:  Regular rate and rhythm;  Without murmur, clicks, rubs or gallops Abdomen:  Soft, nondistended, nontender. Normal bowel sounds. No appreciable masses or hepatomegaly.  No rebound or guarding.  Neurologic:  Alert and oriented x3;  grossly normal neurologically. Tenderness noted over the lower right lower ribs in the mid scapular area mid axillary line and midclavicular line location application of pressure over the right lower ribs over the lower border replicated the pain that she experiences.  I flexed at her right hip the pain eased out of  Psych:  Alert and cooperative. Normal affect.  LAB RESULTS: Recent Labs    02/24/22 0617  WBC 10.7*  HGB 15.0  HCT 47.1*  PLT 362   BMET Recent Labs    02/24/22 0617  NA 140  K 3.5  CL 103  CO2 30  GLUCOSE 131*  BUN 13  CREATININE 0.68  CALCIUM 8.8*   LFT Recent Labs    02/24/22 0617  PROT 6.5  ALBUMIN 3.9  AST 19  ALT 15  ALKPHOS 48  BILITOT 0.4   PT/INR Recent Labs    02/24/22 0617   LABPROT 12.7  INR 1.0  STUDIES: ECHOCARDIOGRAM COMPLETE  Result Date: 02/25/2022    ECHOCARDIOGRAM REPORT   Patient Name:   KINSIE BELFORD [facility] Date of Exam: 02/24/2022 Medical Rec #:  [id number]        Height:       59.0 in Accession #:    [id number]       Weight:       123.2 lb Date of Birth:  [birth year]        BSA:          1.501 m Patient Age:    47 years         BP:           177/76 mmHg Patient Gender: F                HR:           61 bpm. Exam Location:  [facility] Procedure: 2D Echo and Intracardiac Opacification Agent Indications:     Chest Pain  History:         Patient has no prior history of Echocardiogram examinations.                  Risk Factors:Dyslipidemia and Diabetes.  Sonographer:     L. Thornton-Maynard Referring Phys:  [place] Diagnosing Phys: Kathlyn Sacramento MD  Sonographer Comments: Suboptimal parasternal window. IMPRESSIONS  1. Left ventricular ejection fraction, by estimation, is 60 to 65%. The left ventricle has normal function. The left ventricle has no regional wall motion abnormalities. Left ventricular diastolic parameters were normal.  2. Right ventricular systolic function is normal. The right ventricular size is normal. Tricuspid regurgitation signal is inadequate for assessing PA pressure.  3. The mitral valve is normal in structure. No evidence of mitral valve regurgitation. No evidence of mitral stenosis.  4. The aortic valve is normal in structure. Aortic valve regurgitation is not visualized. No aortic stenosis is present.  5. The inferior vena cava is normal in size with greater than 50% respiratory variability, suggesting right atrial pressure of 3 mmHg. FINDINGS  Left Ventricle: Left ventricular ejection fraction, by estimation, is 60 to 65%. The left ventricle has normal function. The left ventricle has no regional wall motion abnormalities. Definity contrast agent was given IV to delineate the left ventricular  endocardial borders. The left ventricular  internal cavity size was normal in size. There is no left ventricular hypertrophy. Left ventricular diastolic parameters were normal. Right Ventricle: The right ventricular size is normal. No increase in right ventricular wall thickness. Right ventricular systolic function is normal. Tricuspid regurgitation signal is inadequate for assessing PA pressure. Left Atrium: Left atrial size was normal in size. Right Atrium: Right atrial size was normal in size. Pericardium: There is no evidence of pericardial effusion. Mitral Valve: The mitral valve is normal in structure. No evidence of mitral valve regurgitation. No evidence of mitral valve stenosis. Tricuspid Valve: The tricuspid valve is normal in structure. Tricuspid valve regurgitation is not demonstrated. No evidence of tricuspid stenosis. Aortic Valve: The aortic valve is normal in structure. Aortic valve regurgitation is not visualized. No aortic stenosis is present. Aortic valve mean gradient measures 6.0 mmHg. Aortic valve peak gradient measures 12.5 mmHg. Aortic valve area, by VTI measures 2.15 cm. Pulmonic Valve: The pulmonic valve was normal in structure. Pulmonic valve regurgitation is not visualized. No evidence of pulmonic stenosis. Aorta: The aortic root is normal in size and structure. Venous: The inferior vena cava is normal in size with greater than  50% respiratory variability, suggesting right atrial pressure of 3 mmHg. IAS/Shunts: No atrial level shunt detected by color flow Doppler.  LEFT VENTRICLE PLAX 2D LVIDd:         3.56 cm     Diastology LVIDs:         2.44 cm     LV e' medial:    7.40 cm/s LV PW:         0.98 cm     LV E/e' medial:  9.9 LV IVS:        0.94 cm     LV e' lateral:   8.16 cm/s LVOT diam:     2.10 cm     LV E/e' lateral: 9.0 LV SV:         74 LV SV Index:   49 LVOT Area:     3.46 cm  LV Volumes (MOD) LV vol d, MOD A2C: 54.7 ml LV vol d, MOD A4C: 67.9 ml LV vol s, MOD A2C: 25.0 ml LV vol s, MOD A4C: 32.9 ml LV SV MOD A2C:      29.7 ml LV SV MOD A4C:     67.9 ml LV SV MOD BP:      33.7 ml RIGHT VENTRICLE RV S prime:     15.10 cm/s TAPSE (M-mode): 2.3 cm LEFT ATRIUM             Index        RIGHT ATRIUM           Index LA Vol (A2C):   46.5 ml 30.97 ml/m  RA Area:     15.70 cm LA Vol (A4C):   44.5 ml 29.64 ml/m  RA Volume:   39.60 ml  26.38 ml/m LA Biplane Vol: 50.0 ml 33.30 ml/m  AORTIC VALVE                     PULMONIC VALVE AV Area (Vmax):    2.07 cm      PV Vmax:       0.89 m/s AV Area (Vmean):   2.20 cm      PV Peak grad:  3.2 mmHg AV Area (VTI):     2.15 cm AV Vmax:           177.00 cm/s AV Vmean:          115.000 cm/s AV VTI:            0.343 m AV Peak Grad:      12.5 mmHg AV Mean Grad:      6.0 mmHg LVOT Vmax:         106.00 cm/s LVOT Vmean:        72.900 cm/s LVOT VTI:          0.213 m LVOT/AV VTI ratio: 0.62  AORTA Ao Root diam: 3.00 cm MITRAL VALVE MV Area (PHT): 3.03 cm    SHUNTS MV Decel Time: 250 msec    Systemic VTI:  0.21 m MV E velocity: 73.30 cm/s  Systemic Diam: 2.10 cm MV A velocity: 64.30 cm/s MV E/A ratio:  1.14 Kathlyn Sacramento MD Electronically signed by Kathlyn Sacramento MD Signature Date/Time: 02/25/2022/10:59:25 AM    Final    US ABDOMEN LIMITED RUQ (LIVER/GB)  Result Date: 02/25/2022 CLINICAL DATA:  Pain EXAM: ULTRASOUND ABDOMEN LIMITED RIGHT UPPER QUADRANT COMPARISON:  02/24/2022 FINDINGS: Gallbladder: Gallbladder wall appears thickened measuring 4.3 mm. No gallstones, pericholecystic fluid, or sonographic Murphy's sign. Common bile duct: Diameter: 3.9 mm  Liver: No focal lesion identified. Within normal limits in parenchymal echogenicity. Portal vein is patent on color Doppler imaging with normal direction of blood flow towards the liver. Other: None. IMPRESSION: Gallbladder wall thickening. No gallstones, pericholecystic fluid or sonographic Murphy's sign. If there is a high clinical concern for acute cholecystitis and further imaging is clinically indicated consider nuclear medicine hepatic biliary scan  to assess patency of the cystic duct. Electronically Signed   By: Kerby Moors M.D.   On: 02/25/2022 06:58   CT ABDOMEN PELVIS W CONTRAST  Result Date: 02/24/2022 CLINICAL DATA:  Abdominal pain EXAM: CT ABDOMEN AND PELVIS WITH CONTRAST TECHNIQUE: Multidetector CT imaging of the abdomen and pelvis was performed using the standard protocol following bolus administration of intravenous contrast. RADIATION DOSE REDUCTION: This exam was performed according to the departmental dose-optimization program which includes automated exposure control, adjustment of the mA and/or kV according to patient size and/or use of iterative reconstruction technique. CONTRAST:  13m OMNIPAQUE IOHEXOL 350 MG/ML SOLN COMPARISON:  Noncontrast CT done on 12/10/2019 FINDINGS: Lower chest: Increased interstitial markings in the subpleural region may suggest scarring. There is small patchy infiltrate in the lingula in image 53 of series 5, there is 5 mm nodule in right middle lobe. This finding was not distinctly seen on the previous study. Coronary artery calcifications are seen. Hepatobiliary: No focal abnormalities are seen in liver. There is no dilation of bile ducts. There is enhancement in the wall of the gallbladder. There is fluid adjacent to the gallbladder. There is no significant dilation of bile ducts. Pancreas: No focal abnormalities are seen. Spleen: Unremarkable. Adrenals/Urinary Tract: Adrenals are unremarkable. There is no hydronephrosis. There are no renal or ureteral stones. There is 2.3 cm fluid density structure in the lower pole of left kidney consistent with renal cysts. There are few subcentimeter low-density lesions in the right renal cortex. No follow-up imaging is recommended. Ureters are unremarkable. Urinary bladder is unremarkable. Stomach/Bowel: Stomach is moderately distended. Small bowel loops are not dilated. Appendix is not seen. There is no focal pericecal inflammation. There is no significant focal wall  thickening in colon. Scattered diverticula are seen without signs of focal acute diverticulitis. Vascular/Lymphatic: Atherosclerotic plaques and calcifications are seen in aorta and its major branches. Reproductive: Uterus is not seen. Other: There is no ascites or pneumoperitoneum. Umbilical hernia containing fat is seen. Musculoskeletal: Pectus excavatum deformity is noted. IMPRESSION: There is enhancement in the wall of the gallbladder along with pericholecystic fluid. Possibility of acute cholecystitis is not excluded. Follow-up gallbladder sonogram may be considered. There is no evidence of intestinal obstruction or pneumoperitoneum. There is no hydronephrosis. Coronary artery calcifications are seen. Scarring in the periphery of lower lung fields may suggest chronic interstitial lung disease with fibrosis. Linear densities in lingula may suggest scarring or subsegmental atelectasis. There is 5 mm nodular density in right middle lobe. No follow-up needed if patient is low-risk.This recommendation follows the consensus statement: Guidelines for Management of Incidental Pulmonary Nodules Detected on CT Images: From the Fleischner Society 2017; Radiology 2017; 284:228-243. Diverticulosis of colon without signs of diverticulitis. Renal cysts. Other findings as described in the body of the report. Electronically Signed   By: PElmer PickerM.D.   On: 02/24/2022 15:34   CT Angio Chest Pulmonary Embolism (PE) W or WO Contrast  Result Date: 02/24/2022 CLINICAL DATA:  Positive D-dimer. EXAM: CT ANGIOGRAPHY CHEST WITH CONTRAST TECHNIQUE: Multidetector CT imaging of the chest was performed using the standard protocol during bolus administration  of intravenous contrast. Multiplanar CT image reconstructions and MIPs were obtained to evaluate the vascular anatomy. RADIATION DOSE REDUCTION: This exam was performed according to the departmental dose-optimization program which includes automated exposure control,  adjustment of the mA and/or kV according to patient size and/or use of iterative reconstruction technique. CONTRAST:  180m OMNIPAQUE IOHEXOL 350 MG/ML SOLN COMPARISON:  CT of the chest 08/08/2020 FINDINGS: Cardiovascular: Satisfactory opacification of the pulmonary arteries to the segmental level. No evidence of pulmonary embolism. Normal heart size. No pericardial effusion. There are atherosclerotic calcifications of the aorta. Mediastinum/Nodes: No enlarged mediastinal, hilar, or axillary lymph nodes. Thyroid gland, trachea, and esophagus demonstrate no significant findings. Lungs/Pleura: Mild emphysematous changes are again seen. There is a small amount of atelectasis in the lung bases. There is no new focal lung infiltrate, pleural effusion or pneumothorax. Upper Abdomen: No acute abnormality. Musculoskeletal: There is pectus deformity of the chest, unchanged. Degenerative changes are seen throughout the spine. There is vertebral body hemangioma at T5. Review of the MIP images confirms the above findings. IMPRESSION: 1. No evidence for pulmonary embolism. No acute cardiopulmonary process. 2. Aortic Atherosclerosis (ICD10-I70.0) and Emphysema (ICD10-J43.9). Electronically Signed   By: ARonney AstersM.D.   On: 02/24/2022 15:25   [place]Venous Img Lower Bilateral (DVT)  Result Date: 02/24/2022 CLINICAL DATA:  Elevated D-dimer EXAM: BILATERAL LOWER EXTREMITY VENOUS DOPPLER ULTRASOUND TECHNIQUE: Gray-scale sonography with graded compression, as well as color Doppler and duplex ultrasound were performed to evaluate the lower extremity deep venous systems from the level of the common femoral vein and including the common femoral, femoral, profunda femoral, popliteal and calf veins including the posterior tibial, peroneal and gastrocnemius veins when visible. The superficial great saphenous vein was also interrogated. Spectral Doppler was utilized to evaluate flow at rest and with distal augmentation maneuvers in the  common femoral, femoral and popliteal veins. COMPARISON:  None Available. FINDINGS: RIGHT LOWER EXTREMITY Common Femoral Vein: No evidence of thrombus. Normal compressibility, respiratory phasicity and response to augmentation. Saphenofemoral Junction: No evidence of thrombus. Normal compressibility and flow on color Doppler imaging. Profunda Femoral Vein: No evidence of thrombus. Normal compressibility and flow on color Doppler imaging. Femoral Vein: No evidence of thrombus. Normal compressibility, respiratory phasicity and response to augmentation. Popliteal Vein: No evidence of thrombus. Normal compressibility, respiratory phasicity and response to augmentation. Calf Veins: No evidence of thrombus. Normal compressibility and flow on color Doppler imaging. Superficial Great Saphenous Vein: No evidence of thrombus. Normal compressibility. Venous Reflux:  None. Other Findings:  None. LEFT LOWER EXTREMITY Common Femoral Vein: No evidence of thrombus. Normal compressibility, respiratory phasicity and response to augmentation. Saphenofemoral Junction: No evidence of thrombus. Normal compressibility and flow on color Doppler imaging. Profunda Femoral Vein: No evidence of thrombus. Normal compressibility and flow on color Doppler imaging. Femoral Vein: No evidence of thrombus. Normal compressibility, respiratory phasicity and response to augmentation. Popliteal Vein: No evidence of thrombus. Normal compressibility, respiratory phasicity and response to augmentation. Calf Veins: No evidence of thrombus. Normal compressibility and flow on color Doppler imaging. Superficial Great Saphenous Vein: No evidence of thrombus. Normal compressibility. Venous Reflux:  None. Other Findings:  None. IMPRESSION: No evidence of deep venous thrombosis in either lower extremity. Electronically Signed   By: PElmer PickerM.D.   On: 02/24/2022 13:10      Impression / Plan:   BVALTA DILLONis a 71y.o. y/o female whom I have  been asked to see for right-sided abdominal pain. Her history and examination  is consistent with musculoskeletal pain.  She has marked tenderness over the lower border of the ribs in the mid scapular, midaxillary and midclavicular line on the right side.  She has associated numbness in her arms and legs on occasions.  She also has a history of osteoporosis and states she has back problems.  I suspect she has issues with her back may be a  nerve compression.  I do not believe this pain warrants any further GI work-up at this point.  Would recommend her to follow-up with her primary care physician to have her back evaluated and she could also see her gastroenterologist if there is concern of any GI issues in the future Dr. [name]  I will sign off.  Please call me if any further GI concerns or questions.  We would like to thank you for the opportunity to participate in the care of OTTIS SARNOWSKI.   Thank you for involving me in the care of this patient.      LOS: 2 days   Jonathon Bellows, MD  02/26/2022, 10:01 AM

## 2022-02-26 NOTE — TOC Initial Note (Signed)
Transition of Care [facility]) - Initial/Assessment Note    Patient Details  Name: April Best MRN: [id number] Date of Birth: 06/08/1951  Transition of Care [facility]) CM/SW Contact:    Shelbie Hutching, RN Phone Number: 02/26/2022, 4:53 PM  Clinical Narrative:                   Transition of Care [facility]) Screening Note   Patient Details  Name: April Best Date of Birth: Jul 09, 1950   Transition of Care [facility]) CM/SW Contact:    Shelbie Hutching, RN Phone Number: 02/26/2022, 4:53 PM    Transition of Care Department [facility] has reviewed patient and no TOC needs have been identified at this time. We will continue to monitor patient advancement through interdisciplinary progression rounds. If new patient transition needs arise, please place a TOC consult.         Patient Goals and CMS Choice        Expected Discharge Plan and Services           Expected Discharge Date: 02/26/22                                    Prior Living Arrangements/Services                       Activities of Daily Living Home Assistive Devices/Equipment: None ADL Screening (condition at time of admission) Patient's cognitive ability adequate to safely complete daily activities?: Yes Is the patient deaf or have difficulty hearing?: No Does the patient have difficulty seeing, even when wearing glasses/contacts?: Yes Does the patient have difficulty concentrating, remembering, or making decisions?: No Patient able to express need for assistance with ADLs?: Yes Does the patient have difficulty dressing or bathing?: No Independently performs ADLs?: Yes (appropriate for developmental age) Does the patient have difficulty walking or climbing stairs?: No Weakness of Legs: None Weakness of Arms/Hands: None  Permission Sought/Granted                  Emotional Assessment              Admission diagnosis:  Acute ST elevation myocardial infarction (STEMI) of inferior wall  (HCC) [I21.19] Unstable angina ([place]) [I20.0] Patient Active Problem List   Diagnosis Date Noted   Gastroesophageal reflux disease    Chest pain of uncertain etiology 78/29/5621   Depression with anxiety 02/24/2022   RA (rheumatoid arthritis) ([place]) 02/24/2022   Upper abdominal pain 02/24/2022   Lung nodule 30/86/5784   Lichen planus of tongue 10/17/2021   Hyperthyroidism 10/17/2021   Wheezing 10/17/2021   Chronic obstructive pulmonary disease ([place]) 10/17/2021   Bilateral sacral insufficiency fracture 09/05/2021   Cervico-occipital neuralgia 09/05/2021   Hip pain 09/05/2021   Lumbar radiculopathy 09/05/2021   Nausea 09/05/2021   Osteoporosis 09/05/2021   Chronic pain syndrome 09/05/2021   Right facial numbness 08/09/2021   Aortic atherosclerosis ([place]) 08/09/2021   Recurrent major depressive disorder, in full remission ([place]) 08/09/2021   Senile purpura ([place]) 08/09/2021   Fibromyalgia 04/24/2021   Leukocytosis 04/23/2021   Chronic fatigue 04/05/2021   Skin rash 04/05/2021   Hot flashes 04/05/2021   Pharyngoesophageal dysphagia 04/05/2021   Raised antibody titer 03/01/2020   Psoriatic arthritis ([place]) 01/25/2020   ANA positive 01/19/2020   Renal cyst 12/01/2019   Proteinuria 12/01/2019   Low back pain 07/12/2019   Pulmonary emphysema ([place]) 07/24/2018  Elevated glucose 02/13/2015   [name] ([place]) 12/14/2014   Family history of hemochromatosis 11/02/2014   Hypercholesteremia 11/02/2014   Essential hypertension 11/02/2014   Current tobacco use 11/02/2014   Episodic paroxysmal anxiety disorder 10/02/2007   PCP:  Virginia Crews, MD Pharmacy:   [facility] [place] [place] [facility] AT [address] [place] [address] Phone: [phone] Fax: [phone]  [facility] [place], [place] [address] [place] [address] Phone: [phone] Fax:  [phone]     Social Determinants of Health (SDOH) Interventions    Readmission Risk Interventions     No data to display

## 2022-02-26 NOTE — Progress Notes (Signed)
Patient complained of Rt flank pain10/10.dilaudid '1mg'$  given as per MD at 0219.

## 2022-02-27 ENCOUNTER — Telehealth: Payer: Self-pay

## 2022-02-27 NOTE — Telephone Encounter (Signed)
Transition Care Management Follow-up Telephone Call Date of discharge and from where:TCM DC [facility] 02-26-22 Dx: Chest pain of uncertain etiology  How have you been since you were released from the hospital? Doing ok  Any questions or concerns? No  Items Reviewed: Did the pt receive and understand the discharge instructions provided? Yes  Medications obtained and verified? Yes  Other? No  Any new allergies since your discharge? No  Dietary orders reviewed? Yes Do you have support at home? Yes   Home Care and Equipment/Supplies: Were home health services ordered? no If so, what is the name of the agency? na  Has the agency set up a time to come to the patient's home? not applicable Were any new equipment or medical supplies ordered?  No What is the name of the medical supply agency? na Were you able to get the supplies/equipment? not applicable Do you have any questions related to the use of the equipment or supplies? No  Functional Questionnaire: (I = Independent and D = Dependent) ADLs: I  Bathing/Dressing- I  Meal Prep- I  Eating- I  Maintaining continence- I  Transferring/Ambulation- I  Managing Meds- I  Follow up appointments reviewed:  PCP Hospital f/u appt confirmed? Yes  [facility] No  pt is making fu appt with GI for endoscopy  Are transportation arrangements needed? No  If their condition worsens, is the pt aware to call PCP or go to the Emergency Dept.? Yes Was the patient provided with contact information for the PCP's office or ED? Yes Was to pt encouraged to call back with questions or concerns? Yes

## 2022-03-04 DIAGNOSIS — R0789 Other chest pain: Secondary | ICD-10-CM | POA: Diagnosis not present

## 2022-03-04 DIAGNOSIS — G894 Chronic pain syndrome: Secondary | ICD-10-CM | POA: Diagnosis not present

## 2022-03-04 DIAGNOSIS — R1011 Right upper quadrant pain: Secondary | ICD-10-CM | POA: Diagnosis not present

## 2022-03-04 DIAGNOSIS — L400 Psoriasis vulgaris: Secondary | ICD-10-CM | POA: Diagnosis not present

## 2022-03-04 DIAGNOSIS — D692 Other nonthrombocytopenic purpura: Secondary | ICD-10-CM | POA: Diagnosis not present

## 2022-03-04 DIAGNOSIS — R1319 Other dysphagia: Secondary | ICD-10-CM | POA: Diagnosis not present

## 2022-03-04 DIAGNOSIS — K219 Gastro-esophageal reflux disease without esophagitis: Secondary | ICD-10-CM | POA: Diagnosis not present

## 2022-03-05 NOTE — Progress Notes (Unsigned)
Established patient visit   Patient: April Best   DOB: [birth year]   71 y.o. Female  MRN: [id number] Visit Date: 03/06/2022  Today's healthcare provider: Gwyneth Sprout, FNP   No chief complaint on file.  Subjective    HPI  Follow up Hospitalization  Patient was admitted to [facility] on 02/24/22 and discharged on 02/26/22. She was treated for STEMI. Treatment for this included ***. Telephone follow up was done on 9/13 She reports {excellent/good/fair:19992} compliance with treatment. She reports this condition is {resolved/improved/worsened:23923}.  ----------------------------------------------------------------------------------------- -   Medications: Outpatient Medications Prior to Visit  Medication Sig   amLODipine (NORVASC) 5 MG tablet Take 1 tablet (5 mg total) by mouth daily.   aspirin 81 MG chewable tablet Chew 1 tablet (81 mg total) by mouth daily.   azelastine (ASTELIN) 0.1 % nasal spray Place 1 spray into both nostrils daily as needed.   Budeson-Glycopyrrol-Formoterol (BREZTRI AEROSPHERE) 160-9-4.8 MCG/ACT AERO Inhale 2 puffs into the lungs 2 (two) times daily.   clobetasol ointment (TEMOVATE) 0.05 % APP TOPICALLY TO GLUTEAL AREA ONCE D PRF DERMATITIS   COLLAGEN PO Take 30 mLs by mouth.   diclofenac Sodium (VOLTAREN) 1 % GEL Apply 2 g topically 4 (four) times daily.   DULoxetine (CYMBALTA) 20 MG capsule Cymbalta 20 mg capsule,delayed release  Take 1 capsule twice a day by oral route.   escitalopram (LEXAPRO) 20 MG tablet TAKE ONE TABLET BY MOUTH EVERY DAY   fluticasone (FLONASE) 50 MCG/ACT nasal spray Place 2 sprays into both nostrils daily.   gabapentin (NEURONTIN) 100 MG capsule Take 100 mg by mouth 2 (two) times daily as needed.   gabapentin (NEURONTIN) 100 MG capsule Take 1 capsule by mouth every morning.   gabapentin (NEURONTIN) 300 MG capsule Take 300 mg by mouth as needed (qhs). PRN   gentamicin ointment (GARAMYCIN) 0.1 % Apply 1 application. topically  3 (three) times daily.   hydrocortisone 2.5 % ointment Apply topically 2 (two) times daily.   loratadine (CLARITIN) 10 MG tablet Take 10 mg by mouth daily.   losartan (COZAAR) 25 MG tablet Take 25 mg by mouth daily.   methocarbamol (ROBAXIN) 750 MG tablet methocarbamol 750 mg tablet  Take 1 tablet 3 times a day by oral route for 30 days.   nitroGLYCERIN (NITROSTAT) 0.4 MG SL tablet Place 1 tablet (0.4 mg total) under the tongue every 5 (five) minutes as needed for chest pain.   ondansetron (ZOFRAN) 4 MG tablet TAKE ONE TABLET BY MOUTH EVERY EIGHT HOURS AS NEEDED FOR NAUSEA / VOMITING   oxyCODONE-acetaminophen (PERCOCET) 10-325 MG tablet Take 1 tablet by mouth every 4 (four) hours as needed for pain.   pantoprazole (PROTONIX) 40 MG tablet Take 1 tablet (40 mg total) by mouth 2 (two) times daily before a meal.   potassium chloride (KLOR-CON M) 10 MEQ tablet Take 2 tablets (20 mEq total) by mouth daily.   predniSONE (DELTASONE) 10 MG tablet Take 1 tablet by mouth daily.   predniSONE (DELTASONE) 5 MG tablet Take by mouth.   rosuvastatin (CRESTOR) 5 MG tablet TAKE 1 TABLET BY MOUTH DAILY   traZODone (DESYREL) 100 MG tablet Take 0.5-1 tablets (50-100 mg total) by mouth at bedtime.   triamcinolone cream (KENALOG) 0.1 % Apply topically 2 (two) times daily as needed.   No facility-administered medications prior to visit.    Review of Systems  {Labs  Heme  Chem  Endocrine  Serology  Results Review (optional):23779}   Objective  There were no vitals taken for this visit. {Show previous vital signs (optional):23777}  Physical Exam  ***  No results found for any visits on 03/06/22.  Assessment & Plan     ***  No follow-ups on file.      {provider attestation***:1}   Gwyneth Sprout, [place] [phone] (phone) [phone] (fax)  [place]

## 2022-03-06 ENCOUNTER — Ambulatory Visit
Admission: RE | Admit: 2022-03-06 | Discharge: 2022-03-06 | Disposition: A | Discharge status: Home / Self Care | Payer: PPO | Source: Ambulatory Visit | Attending: Family Medicine | Admitting: Family Medicine

## 2022-03-06 ENCOUNTER — Encounter: Payer: Self-pay | Admitting: Family Medicine

## 2022-03-06 ENCOUNTER — Ambulatory Visit (INDEPENDENT_AMBULATORY_CARE_PROVIDER_SITE_OTHER): Payer: PPO | Admitting: Family Medicine

## 2022-03-06 VITALS — BP 120/69 | HR 77 | Resp 16 | Ht 59.0 in | Wt 116.0 lb

## 2022-03-06 DIAGNOSIS — B370 Candidal stomatitis: Secondary | ICD-10-CM | POA: Diagnosis not present

## 2022-03-06 DIAGNOSIS — Z09 Encounter for follow-up examination after completed treatment for conditions other than malignant neoplasm: Secondary | ICD-10-CM | POA: Diagnosis not present

## 2022-03-06 DIAGNOSIS — B3781 Candidal esophagitis: Secondary | ICD-10-CM | POA: Diagnosis not present

## 2022-03-06 DIAGNOSIS — I252 Old myocardial infarction: Secondary | ICD-10-CM

## 2022-03-06 DIAGNOSIS — Z1231 Encounter for screening mammogram for malignant neoplasm of breast: Secondary | ICD-10-CM | POA: Insufficient documentation

## 2022-03-06 MED ORDER — NYSTATIN 100000 UNIT/ML MT SUSP: 5.0000 mL | Freq: Four times a day (QID) | OROMUCOSAL | 0 refills | Status: DC

## 2022-03-06 MED ORDER — AMLODIPINE BESYLATE 5 MG PO TABS: 5.0000 mg | ORAL_TABLET | Freq: Every day | ORAL | 1 refills | Status: DC

## 2022-03-06 MED ORDER — LOSARTAN POTASSIUM 25 MG PO TABS: 25.0000 mg | ORAL_TABLET | Freq: Every day | ORAL | 1 refills | Status: DC

## 2022-03-06 NOTE — Assessment & Plan Note (Signed)
Seen in patient for atypical CP with EKG changes.

## 2022-03-06 NOTE — Assessment & Plan Note (Signed)
Seen inpatient by Dr Fletcher Anon for cath with STEMI noted on EKG with reciprocal changes Does not have a cardiology Is planning to work on smoking cessation

## 2022-03-06 NOTE — Assessment & Plan Note (Signed)
Acute, stable Recommend antifungal rinse and swallow to assist Seek referral to ENT if necessary

## 2022-03-07 NOTE — Progress Notes (Signed)
Hi Charlane  Normal mammogram; repeat in 1 year.  Please let us know if you have any questions.  Thank you,  Sabri Teal, FNP 

## 2022-03-19 ENCOUNTER — Ambulatory Visit: Payer: PPO | Admitting: Anesthesiology

## 2022-03-19 ENCOUNTER — Encounter
Admission: RE | Disposition: A | Discharge status: Home / Self Care | Payer: Self-pay | Source: Home / Self Care | Attending: Gastroenterology

## 2022-03-19 ENCOUNTER — Ambulatory Visit
Admission: RE | Admit: 2022-03-19 | Discharge: 2022-03-19 | Disposition: A | Discharge status: Home / Self Care | Payer: PPO | Attending: Gastroenterology | Admitting: Gastroenterology

## 2022-03-19 ENCOUNTER — Encounter: Payer: Self-pay | Admitting: Gastroenterology

## 2022-03-19 DIAGNOSIS — L4050 Arthropathic psoriasis, unspecified: Secondary | ICD-10-CM | POA: Insufficient documentation

## 2022-03-19 DIAGNOSIS — F32A Depression, unspecified: Secondary | ICD-10-CM | POA: Diagnosis not present

## 2022-03-19 DIAGNOSIS — J449 Chronic obstructive pulmonary disease, unspecified: Secondary | ICD-10-CM | POA: Diagnosis not present

## 2022-03-19 DIAGNOSIS — Z8619 Personal history of other infectious and parasitic diseases: Secondary | ICD-10-CM | POA: Diagnosis not present

## 2022-03-19 DIAGNOSIS — R1013 Epigastric pain: Secondary | ICD-10-CM | POA: Insufficient documentation

## 2022-03-19 DIAGNOSIS — F17210 Nicotine dependence, cigarettes, uncomplicated: Secondary | ICD-10-CM | POA: Diagnosis not present

## 2022-03-19 DIAGNOSIS — K219 Gastro-esophageal reflux disease without esophagitis: Secondary | ICD-10-CM | POA: Diagnosis not present

## 2022-03-19 DIAGNOSIS — J309 Allergic rhinitis, unspecified: Secondary | ICD-10-CM | POA: Diagnosis not present

## 2022-03-19 DIAGNOSIS — R1319 Other dysphagia: Secondary | ICD-10-CM | POA: Diagnosis not present

## 2022-03-19 DIAGNOSIS — R1310 Dysphagia, unspecified: Secondary | ICD-10-CM | POA: Diagnosis not present

## 2022-03-19 DIAGNOSIS — I252 Old myocardial infarction: Secondary | ICD-10-CM | POA: Diagnosis not present

## 2022-03-19 DIAGNOSIS — I10 Essential (primary) hypertension: Secondary | ICD-10-CM | POA: Diagnosis not present

## 2022-03-19 DIAGNOSIS — F17200 Nicotine dependence, unspecified, uncomplicated: Secondary | ICD-10-CM | POA: Insufficient documentation

## 2022-03-19 DIAGNOSIS — F410 Panic disorder [episodic paroxysmal anxiety] without agoraphobia: Secondary | ICD-10-CM | POA: Insufficient documentation

## 2022-03-19 DIAGNOSIS — K2289 Other specified disease of esophagus: Secondary | ICD-10-CM | POA: Diagnosis not present

## 2022-03-19 DIAGNOSIS — E785 Hyperlipidemia, unspecified: Secondary | ICD-10-CM | POA: Diagnosis not present

## 2022-03-19 HISTORY — DX: Acute myocardial infarction, unspecified: I21.9

## 2022-03-19 HISTORY — PX: ESOPHAGOGASTRODUODENOSCOPY: SHX5428

## 2022-03-19 SURGERY — EGD (ESOPHAGOGASTRODUODENOSCOPY)
Anesthesia: General

## 2022-03-19 MED ORDER — PROPOFOL 500 MG/50ML IV EMUL
INTRAVENOUS | Status: DC | PRN
  Administered 2022-03-19: 125 ug/kg/min via INTRAVENOUS

## 2022-03-19 MED ORDER — PROPOFOL 10 MG/ML IV BOLUS
INTRAVENOUS | Status: DC | PRN
  Administered 2022-03-19: 60 mg via INTRAVENOUS

## 2022-03-19 MED ORDER — GLYCOPYRROLATE 0.2 MG/ML IJ SOLN
INTRAMUSCULAR | Status: DC | PRN
  Administered 2022-03-19: .2 mg via INTRAVENOUS

## 2022-03-19 MED ORDER — SODIUM CHLORIDE 0.9 % IV SOLN: INTRAVENOUS | Status: DC

## 2022-03-19 MED ORDER — PROPOFOL 1000 MG/100ML IV EMUL
INTRAVENOUS | Status: AC
  Filled 2022-03-19: qty 100

## 2022-03-19 MED ORDER — LIDOCAINE HCL (CARDIAC) PF 100 MG/5ML IV SOSY
PREFILLED_SYRINGE | INTRAVENOUS | Status: DC | PRN
  Administered 2022-03-19: 40 mg via INTRAVENOUS

## 2022-03-19 MED ORDER — DEXMEDETOMIDINE HCL IN NACL 200 MCG/50ML IV SOLN
INTRAVENOUS | Status: DC | PRN
  Administered 2022-03-19: 4 ug via INTRAVENOUS

## 2022-03-19 MED ORDER — LIDOCAINE HCL (PF) 2 % IJ SOLN
INTRAMUSCULAR | Status: AC
  Filled 2022-03-19: qty 5

## 2022-03-19 NOTE — Anesthesia Postprocedure Evaluation (Signed)
Anesthesia Post Note  Patient: April Best  Procedure(s) Performed: ESOPHAGOGASTRODUODENOSCOPY (EGD)  Patient location during evaluation: Endoscopy Anesthesia Type: General Level of consciousness: awake and alert Pain management: pain level controlled Vital Signs Assessment: post-procedure vital signs reviewed and stable Respiratory status: spontaneous breathing, nonlabored ventilation, respiratory function stable and patient connected to nasal cannula oxygen Cardiovascular status: blood pressure returned to baseline and stable Postop Assessment: no apparent nausea or vomiting Anesthetic complications: no   No notable events documented.   Last Vitals:  Vitals:   03/19/22 1030 03/19/22 1034  BP: (!) 155/82 (!) 163/79  Pulse: 62 62  Resp: 13 (!) 21  Temp:    SpO2:  97%    Last Pain:  Vitals:   03/19/22 1015  TempSrc: Temporal  PainSc:                  Precious Haws Rheanne Cortopassi

## 2022-03-19 NOTE — Transfer of Care (Signed)
Immediate Anesthesia Transfer of Care Note  Patient: April Best  Procedure(s) Performed: Procedure(s): ESOPHAGOGASTRODUODENOSCOPY (EGD) (N/A)  Patient Location: PACU and Endoscopy Unit  Anesthesia Type:General  Level of Consciousness: sedated  Airway & Oxygen Therapy: Patient Spontanous Breathing and Patient connected to nasal cannula oxygen  Post-op Assessment: Report given to RN and Post -op Vital signs reviewed and stable  Post vital signs: Reviewed and stable  Last Vitals:  Vitals:   03/19/22 0902 03/19/22 1015  BP: (!) 183/72 132/73  Pulse:  76  Resp:  20  Temp:  (!) 35.8 C  SpO2:  58%    Complications: No apparent anesthesia complications

## 2022-03-19 NOTE — H&P (Signed)
Outpatient short stay form Pre-procedure 03/19/2022  Lesly Rubenstein, MD  Primary Physician: Virginia Crews, MD  Reason for visit:  Dysphagia  History of present illness:    71 y/o lady with history of COPD, chronic pain on opioids, and RA here for EGD for dysphagia/dyspepsia. No blood thinners. No family history of GI malignancies. No neck surgeries.    Current Facility-Administered Medications:    0.9 %  sodium chloride infusion, , Intravenous, Continuous, Kashira Behunin, Hilton Cork, MD, Last Rate: 20 mL/hr at 03/19/22 0911, New Bag at 03/19/22 0911  Medications Prior to Admission  Medication Sig Dispense Refill Last Dose   amLODipine (NORVASC) 5 MG tablet Take 1 tablet (5 mg total) by mouth daily. 90 tablet 1 03/18/2022   aspirin 81 MG chewable tablet Chew 1 tablet (81 mg total) by mouth daily. 30 tablet 0 03/18/2022   DULoxetine (CYMBALTA) 20 MG capsule Cymbalta 20 mg capsule,delayed release  Take 1 capsule twice a day by oral route.   03/18/2022   escitalopram (LEXAPRO) 20 MG tablet TAKE ONE TABLET BY MOUTH EVERY DAY 90 tablet 0 03/18/2022   losartan (COZAAR) 25 MG tablet Take 1 tablet (25 mg total) by mouth daily. 90 tablet 1 03/18/2022   oxyCODONE-acetaminophen (PERCOCET) 10-325 MG tablet Take 1 tablet by mouth every 4 (four) hours as needed for pain.   03/19/2022   pantoprazole (PROTONIX) 40 MG tablet Take 1 tablet (40 mg total) by mouth 2 (two) times daily before a meal. 60 tablet 1 03/18/2022   potassium chloride (KLOR-CON M) 10 MEQ tablet Take 2 tablets (20 mEq total) by mouth daily. 30 tablet 0 03/18/2022   predniSONE (DELTASONE) 5 MG tablet Take by mouth.   03/18/2022   rosuvastatin (CRESTOR) 5 MG tablet TAKE 1 TABLET BY MOUTH DAILY 90 tablet 1 03/18/2022   azelastine (ASTELIN) 0.1 % nasal spray Place 1 spray into both nostrils daily as needed.      Budeson-Glycopyrrol-Formoterol (BREZTRI AEROSPHERE) 160-9-4.8 MCG/ACT AERO Inhale 2 puffs into the lungs 2 (two) times daily. 10.7 g  11    clobetasol ointment (TEMOVATE) 0.05 % APP TOPICALLY TO GLUTEAL AREA ONCE D PRF DERMATITIS 30 g 1    COLLAGEN PO Take 30 mLs by mouth.      diclofenac Sodium (VOLTAREN) 1 % GEL Apply 2 g topically 4 (four) times daily.      fluticasone (FLONASE) 50 MCG/ACT nasal spray Place 2 sprays into both nostrils daily.      gabapentin (NEURONTIN) 100 MG capsule Take 100 mg by mouth 2 (two) times daily as needed.      gabapentin (NEURONTIN) 100 MG capsule Take 1 capsule by mouth every morning.      gabapentin (NEURONTIN) 300 MG capsule Take 300 mg by mouth as needed (qhs). PRN      gentamicin ointment (GARAMYCIN) 0.1 % Apply 1 application. topically 3 (three) times daily.      hydrocortisone 2.5 % ointment Apply topically 2 (two) times daily.      loratadine (CLARITIN) 10 MG tablet Take 10 mg by mouth daily.      methocarbamol (ROBAXIN) 750 MG tablet methocarbamol 750 mg tablet  Take 1 tablet 3 times a day by oral route for 30 days.      nitroGLYCERIN (NITROSTAT) 0.4 MG SL tablet Place 1 tablet (0.4 mg total) under the tongue every 5 (five) minutes as needed for chest pain. 30 tablet 12    nystatin (MYCOSTATIN) 100000 UNIT/ML suspension Use as directed 5 mLs (  500,000 Units total) in the mouth or throat 4 (four) times daily. 473 mL 0    ondansetron (ZOFRAN) 4 MG tablet TAKE ONE TABLET BY MOUTH EVERY EIGHT HOURS AS NEEDED FOR NAUSEA / VOMITING 30 tablet 0    predniSONE (DELTASONE) 10 MG tablet Take 1 tablet by mouth daily.      traZODone (DESYREL) 100 MG tablet Take 0.5-1 tablets (50-100 mg total) by mouth at bedtime. 30 tablet 5    triamcinolone cream (KENALOG) 0.1 % Apply topically 2 (two) times daily as needed.        Allergies  Allergen Reactions   Enbrel [Etanercept] Hives    Other reaction(s): Muscle Cramps   Modafinil Rash   Augmentin  [Amoxicillin-Pot Clavulanate] Nausea And Vomiting    as stated (XR).   Cefdinir Diarrhea and Other (See Comments)    Other reaction(s): GI intolerance,  Other (see comments) Gives her Cdiff Gives her Cdiff    Lisinopril Cough     Past Medical History:  Diagnosis Date   Allergic rhinitis    Allergy    Annular oral lichen planus    Anxiety    Arthritis    Depression    Deviated nasal septum    nasal obstruction   Guaiac positive stools    history   H. 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   History of palpitations    Hyperlipidemia    Hypertension    Migraine    Myocardial infarction ([place])    Osteoarthritis    Osteoporosis    Panic disorder    Psoriasis    Psoriatic arthritis ([place])    Rheumatoid arteritis ([place])    Tinnitus     Review of systems:  Otherwise negative.    Physical Exam  Gen: Alert, oriented. Appears stated age.  HEENT: PERRLA. Lungs: No respiratory distress CV: RRR Abd: soft, benign, no masses Ext: No edema    Planned procedures: Proceed with EGD. The patient understands the nature of the planned procedure, indications, risks, alternatives and potential complications including but not limited to bleeding, infection, perforation, damage to internal organs and possible oversedation/side effects from anesthesia. The patient agrees and gives consent to proceed.  Please refer to procedure notes for findings, recommendations and patient disposition/instructions.     Lesly Rubenstein, MD [facility] Gastroenterology

## 2022-03-19 NOTE — Anesthesia Preprocedure Evaluation (Signed)
Anesthesia Evaluation  Patient identified by MRN, date of birth, ID band Patient awake    Reviewed: Allergy & Precautions, NPO status , Patient's Chart, lab work & pertinent test results  History of Anesthesia Complications Negative for: history of anesthetic complications  Airway Mallampati: III  TM Distance: <3 FB Neck ROM: full    Dental  (+) Chipped, Poor Dentition   Pulmonary neg shortness of breath, COPD, Current Smoker and Patient abstained from smoking.,    Pulmonary exam normal        Cardiovascular Exercise Tolerance: Good hypertension, (-) angina+ Past MI  Normal cardiovascular exam     Neuro/Psych  Headaches,  Neuromuscular disease negative psych ROS   GI/Hepatic Neg liver ROS, GERD  Controlled,  Endo/Other  Hyperthyroidism   Renal/GU Renal disease  negative genitourinary   Musculoskeletal   Abdominal   Peds  Hematology negative hematology ROS (+)   Anesthesia Other Findings Past Medical History: No date: Allergic rhinitis No date: Allergy No date: Annular oral lichen planus No date: Anxiety No date: Arthritis No date: Depression No date: Deviated nasal septum     Comment:  nasal obstruction No date: Guaiac positive stools     Comment:  history No date: H. pylori infection No date: H/O degenerative disc disease 12/14/2014: Hemochromatosis No date: History of palpitations No date: Hyperlipidemia No date: Hypertension No date: Migraine No date: Myocardial infarction ([place]) No date: Osteoarthritis No date: Osteoporosis No date: Panic disorder No date: Psoriasis No date: Psoriatic arthritis ([place]) No date: Rheumatoid arteritis ([place]) No date: Tinnitus  Past Surgical History: 1983: ABDOMINAL HYSTERECTOMY     Comment:  Ovaries have been removed. No date: ABDOMINOPLASTY No date: APPENDECTOMY No date: BREAST BIOPSY; Right     Comment:  neg- core 03/2006: COLONOSCOPY 11/19/2017: COLONOSCOPY  WITH PROPOFOL; N/A     Comment:  Procedure: COLONOSCOPY WITH PROPOFOL;  Surgeon: [place],               Benay Pike, MD;  Location: [facility] ENDOSCOPY;  Service:               Gastroenterology;  Laterality: N/A; 02/24/2022: CORONARY/GRAFT ACUTE MI REVASCULARIZATION; N/A     Comment:  Procedure: Coronary/Graft Acute MI Revascularization;                Surgeon: Wellington Hampshire, MD;  Location: [place]               CV LAB;  Service: Cardiovascular;  Laterality: N/A; 01/30/2021: ENDOSCOPIC CONCHA BULLOSA RESECTION; Right     Comment:  Procedure: ENDOSCOPIC CONCHA BULLOSA RESECTION;                Surgeon: Clyde Canterbury, MD;  Location: [place];  Service: ENT;  Laterality: Right; 02/24/2022: LEFT HEART CATH AND CORONARY ANGIOGRAPHY; N/A     Comment:  Procedure: LEFT HEART CATH AND CORONARY ANGIOGRAPHY;                Surgeon: Wellington Hampshire, MD;  Location: [place]               CV LAB;  Service: Cardiovascular;  Laterality: N/A; 2002: RHINOPLASTY 01/30/2021: SEPTOPLASTY; N/A     Comment:  Procedure: SEPTOPLASTY;  Surgeon: Clyde Canterbury, MD;                Location: [place];  Service: ENT;  Laterality: N/A; 2008: UPPER GI ENDOSCOPY  BMI    Body Mass Index: 23.23 kg/m      Reproductive/Obstetrics negative OB ROS                             Anesthesia Physical Anesthesia Plan  ASA: 3  Anesthesia Plan: General   Post-op Pain Management:    Induction: Intravenous  PONV Risk Score and Plan: Propofol infusion and TIVA  Airway Management Planned: Natural Airway and Nasal Cannula  Additional Equipment:   Intra-op Plan:   Post-operative Plan:   Informed Consent: I have reviewed the patients History and Physical, chart, labs and discussed the procedure including the risks, benefits and alternatives for the proposed anesthesia with the patient or authorized representative who has indicated his/her  understanding and acceptance.     Dental Advisory Given  Plan Discussed with: Anesthesiologist, CRNA and Surgeon  Anesthesia Plan Comments: (Patient consented for risks of anesthesia including but not limited to:  - adverse reactions to medications - risk of airway placement if required - damage to eyes, teeth, lips or other oral mucosa - nerve damage due to positioning  - sore throat or hoarseness - Damage to heart, brain, nerves, lungs, other parts of body or loss of life  Patient voiced understanding.)        Anesthesia Quick Evaluation

## 2022-03-19 NOTE — Anesthesia Procedure Notes (Signed)
Date/Time: 03/19/2022 10:05 AM  Performed by: Doreen Salvage, CRNAPre-anesthesia Checklist: Patient identified, Emergency Drugs available, Suction available and Patient being monitored Patient Re-evaluated:Patient Re-evaluated prior to induction Oxygen Delivery Method: Nasal cannula Induction Type: IV induction Dental Injury: Teeth and Oropharynx as per pre-operative assessment  Comments: Nasal cannula with etCO2 monitoring

## 2022-03-19 NOTE — Op Note (Signed)
[facility] Division Gastroenterology Patient Name: April Best Procedure Date: 03/19/2022 9:42 AM MRN: [id number] Account #: [id number] Date of Birth: [birth year] Admit Type: Outpatient Age: 71 Room: [facility] ENDO ROOM 1 Gender: Female Note Status: Finalized Instrument Name: Upper Endoscope [phone] Procedure:             Upper GI endoscopy Indications:           Epigastric abdominal pain, Dysphagia Providers:             Andrey Farmer MD, MD Referring MD:          Andrey Farmer MD, MD (Referring MD), Dionne Bucy.                         Bacigalupo (Referring MD) Medicines:             Monitored Anesthesia Care Complications:         No immediate complications. Estimated blood loss:                         Minimal. Procedure:             Pre-Anesthesia Assessment:                        - Prior to the procedure, a History and Physical was                         performed, and patient medications and allergies were                         reviewed. The patient is competent. The risks and                         benefits of the procedure and the sedation options and                         risks were discussed with the patient. All questions                         were answered and informed consent was obtained.                         Patient identification and proposed procedure were                         verified by the physician, the nurse, the                         anesthesiologist, the anesthetist and the technician                         in the endoscopy suite. Mental Status Examination:                         alert and oriented. Airway Examination: normal                         oropharyngeal airway and neck mobility. Respiratory  Examination: clear to auscultation. CV Examination:                         normal. Prophylactic Antibiotics: The patient does not                         require prophylactic antibiotics. Prior                          Anticoagulants: The patient has taken no previous                         anticoagulant or antiplatelet agents except for                         aspirin. ASA Grade Assessment: III - A patient with                         severe systemic disease. After reviewing the risks and                         benefits, the patient was deemed in satisfactory                         condition to undergo the procedure. The anesthesia                         plan was to use monitored anesthesia care (MAC).                         Immediately prior to administration of medications,                         the patient was re-assessed for adequacy to receive                         sedatives. The heart rate, respiratory rate, oxygen                         saturations, blood pressure, adequacy of pulmonary                         ventilation, and response to care were monitored                         throughout the procedure. The physical status of the                         patient was re-assessed after the procedure.                        After obtaining informed consent, the endoscope was                         passed under direct vision. Throughout the procedure,                         the patient's blood pressure, pulse, and oxygen  saturations were monitored continuously. The Endoscope                         was introduced through the mouth, and advanced to the                         second part of duodenum. The upper GI endoscopy was                         accomplished without difficulty. The patient tolerated                         the procedure well. Findings:      A hypertonic lower esophageal sphincter was found. There was a       distinctive feeling of a "pop" as the LES was traversed.      Normal mucosa was found in the entire esophagus. Biopsies were obtained       from the proximal and distal esophagus with cold forceps for histology       of  suspected eosinophilic esophagitis. Estimated blood loss was minimal.      The entire examined stomach was normal.      The examined duodenum was normal. Impression:            - Hypertonic lower esophageal sphincter.                        - Normal mucosa was found in the entire esophagus.                         Biopsied.                        - Normal stomach.                        - Normal examined duodenum. Recommendation:        - Discharge patient to home.                        - Resume previous diet.                        - Continue present medications.                        - Await pathology results.                        - Perform ambulatory esophageal manometry.                        - Return to referring physician as previously                         scheduled. Procedure Code(s):     --- Professional ---                        [phone], Esophagogastroduodenoscopy, flexible,                         transoral; with biopsy, single or multiple  Diagnosis Code(s):     --- Professional ---                        K22.8, Other specified diseases of esophagus                        R10.13, Epigastric pain                        R13.10, Dysphagia, unspecified CPT copyright 2019 American Medical Association. All rights reserved. The codes documented in this report are preliminary and upon coder review may  be revised to meet current compliance requirements. Andrey Farmer MD, MD 03/19/2022 10:16:29 AM Number of Addenda: 0 Note Initiated On: 03/19/2022 9:42 AM Estimated Blood Loss:  Estimated blood loss was minimal.      [facility]

## 2022-03-19 NOTE — Interval H&P Note (Signed)
History and Physical Interval Note:  03/19/2022 9:56 AM  April Best  has presented today for surgery, with the diagnosis of Atypical chest pain (R07.89) Esophageal dysphagia (R13.19) Abdominal pain, RUQ (right upper quadrant) (R10.11) Gastroesophageal reflux disease, unspecified whether esophagitis present (K21.9).  The various methods of treatment have been discussed with the patient and family. After consideration of risks, benefits and other options for treatment, the patient has consented to  Procedure(s): ESOPHAGOGASTRODUODENOSCOPY (EGD) (N/A) as a surgical intervention.  The patient's history has been reviewed, patient examined, no change in status, stable for surgery.  I have reviewed the patient's chart and labs.  Questions were answered to the patient's satisfaction.     Lesly Rubenstein  Ok to proceed with EGD

## 2022-03-20 ENCOUNTER — Encounter: Payer: Self-pay | Admitting: Gastroenterology

## 2022-03-20 LAB — SURGICAL PATHOLOGY

## 2022-04-22 ENCOUNTER — Other Ambulatory Visit: Payer: Self-pay | Admitting: Family Medicine

## 2022-04-22 DIAGNOSIS — F410 Panic disorder [episodic paroxysmal anxiety] without agoraphobia: Secondary | ICD-10-CM

## 2022-04-25 ENCOUNTER — Ambulatory Visit: Payer: PPO | Attending: Cardiology | Admitting: Cardiology

## 2022-04-25 ENCOUNTER — Encounter: Payer: Self-pay | Admitting: Cardiology

## 2022-04-25 VITALS — BP 142/74 | HR 69 | Ht 59.0 in | Wt 116.6 lb

## 2022-04-25 DIAGNOSIS — Z01810 Encounter for preprocedural cardiovascular examination: Secondary | ICD-10-CM | POA: Diagnosis not present

## 2022-04-25 DIAGNOSIS — E7800 Pure hypercholesterolemia, unspecified: Secondary | ICD-10-CM

## 2022-04-25 DIAGNOSIS — I2510 Atherosclerotic heart disease of native coronary artery without angina pectoris: Secondary | ICD-10-CM

## 2022-04-25 DIAGNOSIS — I10 Essential (primary) hypertension: Secondary | ICD-10-CM | POA: Diagnosis not present

## 2022-04-25 DIAGNOSIS — F17200 Nicotine dependence, unspecified, uncomplicated: Secondary | ICD-10-CM

## 2022-04-25 MED ORDER — EZETIMIBE 10 MG PO TABS: 10.0000 mg | ORAL_TABLET | Freq: Every day | ORAL | 5 refills | Status: DC

## 2022-04-25 NOTE — Patient Instructions (Signed)
Medication Instructions:   Your physician has recommended you make the following change in your medication:    START taking Ezetimibe (Zetia) 10 MG once a day.  *If you need a refill on your cardiac medications before your next appointment, please call your pharmacy*   Lab Work:  Your physician recommends that you return for a FASTING lipid profile:  IN 5 MONTHS  - You will need to be fasting. Please do not have anything to eat or drink after midnight the morning you have the lab work. You may only have water or black coffee with no cream or sugar.   - Please go to the [facility]. You will check in at the front desk to the right as you walk into the atrium. Valet Parking is offered if needed. - No appointment needed. You may go any day between 7 am and 6 pm.    Follow-Up: At [facility], you and your health needs are our priority.  As part of our continuing mission to provide you with exceptional heart care, we have created designated Provider Care Teams.  These Care Teams include your primary Cardiologist (physician) and Advanced Practice Providers (APPs -  Physician Assistants and Nurse Practitioners) who all work together to provide you with the care you need, when you need it.  We recommend signing up for the patient portal called "MyChart".  Sign up information is provided on this After Visit Summary.  MyChart is used to connect with patients for Virtual Visits (Telemedicine).  Patients are able to view lab/test results, encounter notes, upcoming appointments, etc.  Non-urgent messages can be sent to your provider as well.   To learn more about what you can do with MyChart, go to [email or web address].    Your next appointment:   5 month(s)  The format for your next appointment:   In Person  Provider:   You may see Kate Sable, MD or one of the following Advanced Practice Providers on your designated Care Team:   Murray Hodgkins, NP Christell Faith,  PA-C Cadence Kathlen Mody, PA-C Gerrie Nordmann, NP    Other Instructions   Important Information About Sugar

## 2022-04-25 NOTE — Progress Notes (Signed)
Cardiology Office Note:    Date:  04/25/2022   ID:  April Best, DOB 03/08/1951, MRN [id number]  PCP:  Virginia Crews, MD   [place] Providers Cardiologist:  Kate Sable, MD     Referring MD: Gwyneth Sprout, FNP   Chief Complaint  Patient presents with   New Patient (Initial Visit)    ST elevation, STEMI, Hospital follow     History of Present Illness:    April Best is a 71 y.o. female with a hx of hypertension, hyperlipidemia, nonobstructive CAD (30% pLAD, 10% RCA), current smoker x40+ years presenting for hospital follow-up.  Seen in the hospital 2 months ago with symptoms of chest pain.  Inferior ST elevations noted on EKG.  Emergent left heart cath obtained, showed nonobstructive coronary artery disease with mild 30% LAD disease, minimal proximal RCA disease 10%.  Troponins were normal x2, EKG changes have stayed the same.  Echo 02/24/2022 showed normal EF 60 to 65%  Etiology for chest pain deemed noncardiac.  Likely GERD versus esophageal spasm.  Norvasc 5 mg daily was started.  Protonix also ordered to regimen.  She has generalized joint pain, arthritis, this has been going on for years before starting Crestor use.  Also has degenerative joint disease, has some chest tenderness with palpation.  Past Medical History:  Diagnosis Date   Allergic rhinitis    Allergy    Annular oral lichen planus    Anxiety    Arthritis    Depression    Deviated nasal septum    nasal obstruction   Guaiac positive stools    history   H. 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    ABDOMINOPLASTY     APPENDECTOMY     BREAST BIOPSY Right    neg- core   COLONOSCOPY  03/2006   COLONOSCOPY WITH PROPOFOL N/A 11/19/2017   Procedure: COLONOSCOPY WITH PROPOFOL;  Surgeon: Toledo, Benay Pike, MD;  Location: [facility] ENDOSCOPY;  Service: Gastroenterology;  Laterality: N/A;   CORONARY/GRAFT ACUTE MI REVASCULARIZATION N/A 02/24/2022   Procedure: Coronary/Graft Acute MI Revascularization;  Surgeon: Wellington Hampshire, MD;  Location: [place] CV LAB;  Service: Cardiovascular;  Laterality: N/A;   ENDOSCOPIC CONCHA BULLOSA RESECTION Right 01/30/2021   Procedure: ENDOSCOPIC CONCHA BULLOSA RESECTION;  Surgeon: Clyde Canterbury, MD;  Location: [place];  Service: ENT;  Laterality: Right;   ESOPHAGOGASTRODUODENOSCOPY N/A 03/19/2022   Procedure: ESOPHAGOGASTRODUODENOSCOPY (EGD);  Surgeon: Lesly Rubenstein, MD;  Location: [facility] ENDOSCOPY;  Service: Gastroenterology;  Laterality: N/A;   LEFT HEART CATH AND CORONARY ANGIOGRAPHY N/A 02/24/2022   Procedure: LEFT HEART CATH AND CORONARY ANGIOGRAPHY;  Surgeon: Wellington Hampshire, MD;  Location: [place] [facility];  Service: Cardiovascular;  Laterality: N/A;   RHINOPLASTY  2002   SEPTOPLASTY N/A 01/30/2021   Procedure: SEPTOPLASTY;  Surgeon: Clyde Canterbury, MD;  Location: [place];  Service: ENT;  Laterality: N/A;   UPPER GI ENDOSCOPY  2008    Current Medications: Current Meds  Medication Sig  amLODipine (NORVASC) 5 MG tablet Take 1 tablet (5 mg total) by mouth daily.   aspirin 81 MG chewable tablet Chew 1 tablet (81 mg total) by mouth daily.   azelastine (ASTELIN) 0.1 % nasal spray Place 1 spray into both nostrils daily as needed.   Budeson-Glycopyrrol-Formoterol (BREZTRI AEROSPHERE) 160-9-4.8 MCG/ACT AERO Inhale 2 puffs into the lungs 2 (two) times daily.   clobetasol ointment (TEMOVATE) 0.05 % APP TOPICALLY TO GLUTEAL AREA ONCE D PRF DERMATITIS   COLLAGEN PO Take 30 mLs by mouth.   diclofenac Sodium (VOLTAREN) 1 % GEL Apply 2  g topically 4 (four) times daily.   DULoxetine (CYMBALTA) 20 MG capsule Cymbalta 20 mg capsule,delayed release  Take 1 capsule twice a day by oral route.   escitalopram (LEXAPRO) 20 MG tablet TAKE ONE TABLET BY MOUTH EVERY DAY   ezetimibe (ZETIA) 10 MG tablet Take 1 tablet (10 mg total) by mouth daily.   fluticasone (FLONASE) 50 MCG/ACT nasal spray Place 2 sprays into both nostrils daily.   gabapentin (NEURONTIN) 100 MG capsule Take 100 mg by mouth 2 (two) times daily as needed.   gabapentin (NEURONTIN) 100 MG capsule Take 1 capsule by mouth every morning.   gabapentin (NEURONTIN) 300 MG capsule Take 300 mg by mouth as needed (qhs). PRN   gentamicin ointment (GARAMYCIN) 0.1 % Apply 1 application. topically 3 (three) times daily.   hydrocortisone 2.5 % ointment Apply topically 2 (two) times daily.   loratadine (CLARITIN) 10 MG tablet Take 10 mg by mouth daily.   losartan (COZAAR) 25 MG tablet Take 1 tablet (25 mg total) by mouth daily.   methocarbamol (ROBAXIN) 750 MG tablet methocarbamol 750 mg tablet  Take 1 tablet 3 times a day by oral route for 30 days.   nitroGLYCERIN (NITROSTAT) 0.4 MG SL tablet Place 1 tablet (0.4 mg total) under the tongue every 5 (five) minutes as needed for chest pain.   nystatin (MYCOSTATIN) 100000 UNIT/ML suspension Use as directed 5 mLs (500,000 Units total) in the mouth or throat 4 (four) times daily.   ondansetron (ZOFRAN) 4 MG tablet TAKE ONE TABLET BY MOUTH EVERY EIGHT HOURS AS NEEDED FOR NAUSEA / VOMITING   oxyCODONE-acetaminophen (PERCOCET) 10-325 MG tablet Take 1 tablet by mouth every 4 (four) hours as needed for pain.   pantoprazole (PROTONIX) 40 MG tablet Take 1 tablet (40 mg total) by mouth 2 (two) times daily before a meal.   potassium chloride (KLOR-CON M) 10 MEQ tablet Take 2 tablets (20 mEq total) by mouth daily.   predniSONE (DELTASONE) 10 MG tablet Take 1 tablet by mouth daily.   rosuvastatin (CRESTOR) 5 MG tablet TAKE 1 TABLET BY MOUTH DAILY    traZODone (DESYREL) 100 MG tablet Take 0.5-1 tablets (50-100 mg total) by mouth at bedtime.   triamcinolone cream (KENALOG) 0.1 % Apply topically 2 (two) times daily as needed.     Allergies:   Enbrel [etanercept], Modafinil, Augmentin  [amoxicillin-pot clavulanate], Cefdinir, and Lisinopril   Social History   Socioeconomic History   Marital status: Married    Spouse name: Not on file   Number of children: 1   Years of education: Not on file   Highest education level: High school graduate  Occupational History   Occupation: retired  Tobacco Use   Smoking status: Every Day    Packs/day: 1.00    Years: 49.00    Total pack years: 49.00    Types: Cigarettes   Smokeless tobacco: Never   Tobacco comments:  pt declines smoking cessation ( smoked since age 54)  Vaping Use   Vaping Use: Never used  Substance and Sexual Activity   Alcohol use: Not Currently    Alcohol/week: 0.0 standard drinks of alcohol   Drug use: No   Sexual activity: Yes    Birth control/protection: Surgical  Other Topics Concern   Not on file  Social History Narrative   Not on file   Social Determinants of Health   Financial Resource Strain: Low Risk  (09/24/2021)   Overall Financial Resource Strain (CARDIA)    Difficulty of Paying Living Expenses: Not hard at all  Food Insecurity: No Food Insecurity (02/24/2022)   Hunger Vital Sign    Worried About Running Out of Food in the Last Year: Never true    [place] in the Last Year: Never true  Transportation Needs: Unknown (02/24/2022)   PRAPARE - Transportation    Lack of Transportation (Medical): Patient refused    Lack of Transportation (Non-Medical): Patient refused  Physical Activity: Inactive (09/18/2020)   Exercise Vital Sign    Days of Exercise per Week: 0 days    Minutes of Exercise per Session: 0 min  Stress: Stress Concern Present (09/24/2021)   [place]    Feeling of  Stress : To some extent  Social Connections: Moderately Integrated (09/24/2021)   Social Connection and Isolation Panel [NHANES]    Frequency of Communication with Friends and Family: More than three times a week    Frequency of Social Gatherings with Friends and Family: Three times a week    Attends Religious Services: Never    Active Member of Clubs or Organizations: Yes    Attends Music therapist: More than 4 times per year    Marital Status: Married     Family History: The patient's family history includes ADD / ADHD in her brother; Coronary artery disease in her father; Fibromyalgia in her mother and sister; Hearing loss in her mother and sister; Heart attack in her father; Hemochromatosis in her sister; Hyperlipidemia in her father, mother, and sister; Hypertension in her father, mother, and sister; Hypothyroidism in her mother; Lung cancer in her father. There is no history of Breast cancer.  ROS:   Please see the history of present illness.     All other systems reviewed and are negative.  EKGs/Labs/Other Studies Reviewed:    The following studies were reviewed today:   EKG:  EKG is  ordered today.  The ekg ordered today demonstrates normal sinus rhythm, low voltage QRS, inferior ST elevation which is unchanged from prior ECG   Recent Labs: 10/08/2021: TSH 0.453 02/24/2022: ALT 15; B Natriuretic Peptide 83.5; BUN 13; Creatinine, Ser 0.68; Hemoglobin 15.0; Platelets 362; Potassium 3.5; Sodium 140  Recent Lipid Panel    Component Value Date/Time   CHOL 185 02/24/2022 0617   CHOL 230 (H) 08/09/2021 1402   TRIG 103 02/24/2022 0617   HDL 44 02/24/2022 0617   HDL 75 08/09/2021 1402   CHOLHDL 4.2 02/24/2022 0617   VLDL 21 02/24/2022 0617   LDLCALC 120 (H) 02/24/2022 0617   LDLCALC 134 (H) 08/09/2021 1402     Risk Assessment/Calculations:     HYPERTENSION CONTROL Vitals:   04/25/22 1010 04/25/22 1017  BP: (!) 142/74 (!) 142/74    The patient's blood  pressure is elevated above target today.  In order to address the patient's elevated BP: Blood pressure will be monitored  at home to determine if medication changes need to be made.            Physical Exam:    VS:  BP (!) 142/74 (BP Location: Right Arm)   Pulse 69   Ht '4\' 11"'$  (1.499 m)   Wt 116 lb 9.6 oz (52.9 kg)   SpO2 92%   BMI 23.55 kg/m     Wt Readings from Last 3 Encounters:  04/25/22 116 lb 9.6 oz (52.9 kg)  03/19/22 115 lb (52.2 kg)  03/06/22 116 lb (52.6 kg)     GEN:  Well nourished, well developed in no acute distress HEENT: Normal NECK: No JVD; No carotid bruits CARDIAC: RRR, no murmurs, rubs, gallops RESPIRATORY:  Clear to auscultation without rales, wheezing or rhonchi  ABDOMEN: Soft, non-tender, non-distended MUSCULOSKELETAL:  No edema; tenderness with chest palpation SKIN: Warm and dry NEUROLOGIC:  Alert and oriented x 3 PSYCHIATRIC:  Normal affect   ASSESSMENT:    1. Coronary artery disease involving native coronary artery of native heart, unspecified whether angina present   2. Primary hypertension   3. Pure hypercholesterolemia   4. Smoking   5. Pre-procedural cardiovascular examination    PLAN:    In order of problems listed above:  Nonobstructive CAD, mild LAD, minimal RCA disease.  Musculoskeletal chest discomfort/tenderness with palpation.  Continue aspirin, Crestor.  Add Zetia.  EF 60 to 65%.  Hypertension, BP usually controlled.  Continue lisinopril, Norvasc as prescribed. Hyperlipidemia, start Zetia, continue Crestor.  Several musculoskeletal pain issues, trying to avoid side effects statins if possible.  Repeat lipid panel in 5 months. Current smoker, smoking cessation advised Preop evaluation, okay to have dental cleaning from a cardiac perspective.  Patient did not have an MI, no obstructive disease noted on left heart cath, troponin was normal, echo with no wall motion abnormalities.  Changes on EKG are chronic.  Follow-up in 6  months       Medication Adjustments/Labs and Tests Ordered: Current medicines are reviewed at length with the patient today.  Concerns regarding medicines are outlined above.  Orders Placed This Encounter  Procedures   Lipid panel   Meds ordered this encounter  Medications   ezetimibe (ZETIA) 10 MG tablet    Sig: Take 1 tablet (10 mg total) by mouth daily.    Dispense:  30 tablet    Refill:  5    Patient Instructions  Medication Instructions:   Your physician has recommended you make the following change in your medication:    START taking Ezetimibe (Zetia) 10 MG once a day.  *If you need a refill on your cardiac medications before your next appointment, please call your pharmacy*   Lab Work:  Your physician recommends that you return for a FASTING lipid profile:  IN 5 MONTHS  - You will need to be fasting. Please do not have anything to eat or drink after midnight the morning you have the lab work. You may only have water or black coffee with no cream or sugar.   - Please go to the [facility]. You will check in at the front desk to the right as you walk into the atrium. Valet Parking is offered if needed. - No appointment needed. You may go any day between 7 am and 6 pm.    Follow-Up: At [facility], you and your health needs are our priority.  As part of our continuing mission to provide you with exceptional heart care, we have  created designated Provider Care Teams.  These Care Teams include your primary Cardiologist (physician) and Advanced Practice Providers (APPs -  Physician Assistants and Nurse Practitioners) who all work together to provide you with the care you need, when you need it.  We recommend signing up for the patient portal called "MyChart".  Sign up information is provided on this After Visit Summary.  MyChart is used to connect with patients for Virtual Visits (Telemedicine).  Patients are able to view lab/test results, encounter notes,  upcoming appointments, etc.  Non-urgent messages can be sent to your provider as well.   To learn more about what you can do with MyChart, go to [email or web address].    Your next appointment:   5 month(s)  The format for your next appointment:   In Person  Provider:   You may see Kate Sable, MD or one of the following Advanced Practice Providers on your designated Care Team:   Murray Hodgkins, NP Christell Faith, PA-C Cadence Kathlen Mody, PA-C Gerrie Nordmann, NP    Other Instructions   Important Information About Sugar         Signed, Kate Sable, MD  04/25/2022 11:39 AM    [place]

## 2022-04-26 NOTE — Addendum Note (Signed)
Addended by: Janan Ridge on: 04/26/2022 11:54 AM   Modules accepted: Orders

## 2022-05-02 DIAGNOSIS — M5412 Radiculopathy, cervical region: Secondary | ICD-10-CM | POA: Diagnosis not present

## 2022-05-02 DIAGNOSIS — M797 Fibromyalgia: Secondary | ICD-10-CM | POA: Diagnosis not present

## 2022-05-02 DIAGNOSIS — M810 Age-related osteoporosis without current pathological fracture: Secondary | ICD-10-CM | POA: Diagnosis not present

## 2022-05-02 DIAGNOSIS — M5481 Occipital neuralgia: Secondary | ICD-10-CM | POA: Diagnosis not present

## 2022-05-02 DIAGNOSIS — M25559 Pain in unspecified hip: Secondary | ICD-10-CM | POA: Diagnosis not present

## 2022-05-02 DIAGNOSIS — M5459 Other low back pain: Secondary | ICD-10-CM | POA: Diagnosis not present

## 2022-05-02 DIAGNOSIS — Z79899 Other long term (current) drug therapy: Secondary | ICD-10-CM | POA: Diagnosis not present

## 2022-05-02 DIAGNOSIS — M5416 Radiculopathy, lumbar region: Secondary | ICD-10-CM | POA: Diagnosis not present

## 2022-05-02 DIAGNOSIS — R768 Other specified abnormal immunological findings in serum: Secondary | ICD-10-CM | POA: Diagnosis not present

## 2022-05-02 DIAGNOSIS — G894 Chronic pain syndrome: Secondary | ICD-10-CM | POA: Diagnosis not present

## 2022-05-02 DIAGNOSIS — L409 Psoriasis, unspecified: Secondary | ICD-10-CM | POA: Diagnosis not present

## 2022-05-02 DIAGNOSIS — M539 Dorsopathy, unspecified: Secondary | ICD-10-CM | POA: Diagnosis not present

## 2022-05-02 DIAGNOSIS — N281 Cyst of kidney, acquired: Secondary | ICD-10-CM | POA: Diagnosis not present

## 2022-05-02 DIAGNOSIS — M8438XA Stress fracture, other site, initial encounter for fracture: Secondary | ICD-10-CM | POA: Diagnosis not present

## 2022-05-02 DIAGNOSIS — L4050 Arthropathic psoriasis, unspecified: Secondary | ICD-10-CM | POA: Diagnosis not present

## 2022-05-02 DIAGNOSIS — R110 Nausea: Secondary | ICD-10-CM | POA: Diagnosis not present

## 2022-05-10 ENCOUNTER — Other Ambulatory Visit: Payer: Self-pay | Admitting: Family Medicine

## 2022-05-10 DIAGNOSIS — E7800 Pure hypercholesterolemia, unspecified: Secondary | ICD-10-CM

## 2022-05-15 DIAGNOSIS — M7062 Trochanteric bursitis, left hip: Secondary | ICD-10-CM | POA: Diagnosis not present

## 2022-05-15 DIAGNOSIS — M7061 Trochanteric bursitis, right hip: Secondary | ICD-10-CM | POA: Diagnosis not present

## 2022-05-30 DIAGNOSIS — M791 Myalgia, unspecified site: Secondary | ICD-10-CM | POA: Diagnosis not present

## 2022-05-30 DIAGNOSIS — M5481 Occipital neuralgia: Secondary | ICD-10-CM | POA: Diagnosis not present

## 2022-06-12 NOTE — Progress Notes (Deleted)
Complete physical exam   Patient: April Best   DOB: 11/21/1950   71 y.o. Female  MRN: [id number] Visit Date: 06/18/2022  Today's healthcare provider: Lavon Paganini, MD   No chief complaint on file.  Subjective    April Best is a 71 y.o. female who presents today for a complete physical exam.  She reports consuming a {diet types:17450} diet. {Exercise:19826} She generally feels {well/fairly well/poorly:18703}. She reports sleeping {well/fairly well/poorly:18703}. She {does/does not:200015} have additional problems to discuss today.  HPI  09/24/21 AWV  Past Medical History:  Diagnosis Date   Allergic rhinitis    Allergy    Annular oral lichen planus    Anxiety    Arthritis    Depression    Deviated nasal septum    nasal obstruction   Guaiac positive stools    history   H. 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   History of palpitations    Hyperlipidemia    Hypertension    Migraine    Osteoarthritis    Osteoporosis    Panic disorder    Psoriasis    Psoriatic arthritis ([place])    Rheumatoid arteritis ([place])    Tinnitus    Past Surgical History:  Procedure Laterality Date   ABDOMINAL HYSTERECTOMY  1983   Ovaries have been removed.   ABDOMINOPLASTY     APPENDECTOMY     BREAST BIOPSY Right    neg- core   COLONOSCOPY  03/2006   COLONOSCOPY WITH PROPOFOL N/A 11/19/2017   Procedure: COLONOSCOPY WITH PROPOFOL;  Surgeon: Toledo, Benay Pike, MD;  Location: [facility] ENDOSCOPY;  Service: Gastroenterology;  Laterality: N/A;   CORONARY/GRAFT ACUTE MI REVASCULARIZATION N/A 02/24/2022   Procedure: Coronary/Graft Acute MI Revascularization;  Surgeon: Wellington Hampshire, MD;  Location: [place] [facility];  Service: Cardiovascular;  Laterality: N/A;   ENDOSCOPIC CONCHA BULLOSA RESECTION Right 01/30/2021   Procedure: ENDOSCOPIC CONCHA BULLOSA RESECTION;  Surgeon: Clyde Canterbury, MD;  Location: [place];  Service: ENT;   Laterality: Right;   ESOPHAGOGASTRODUODENOSCOPY N/A 03/19/2022   Procedure: ESOPHAGOGASTRODUODENOSCOPY (EGD);  Surgeon: Lesly Rubenstein, MD;  Location: [facility] ENDOSCOPY;  Service: Gastroenterology;  Laterality: N/A;   LEFT HEART CATH AND CORONARY ANGIOGRAPHY N/A 02/24/2022   Procedure: LEFT HEART CATH AND CORONARY ANGIOGRAPHY;  Surgeon: Wellington Hampshire, MD;  Location: [place] [facility];  Service: Cardiovascular;  Laterality: N/A;   RHINOPLASTY  2002   SEPTOPLASTY N/A 01/30/2021   Procedure: SEPTOPLASTY;  Surgeon: Clyde Canterbury, MD;  Location: [place];  Service: ENT;  Laterality: N/A;   UPPER GI ENDOSCOPY  2008   Social History   Socioeconomic History   Marital status: Married    Spouse name: Not on file   Number of children: 1   Years of education: Not on file   Highest education level: High school graduate  Occupational History   Occupation: retired  Tobacco Use   Smoking status: Every Day    Packs/day: 1.00    Years: 49.00    Total pack years: 49.00    Types: Cigarettes   Smokeless tobacco: Never   Tobacco comments:    pt declines smoking cessation ( smoked since age 63)  Vaping Use   Vaping Use: Never used  Substance and Sexual Activity   Alcohol use: Not Currently    Alcohol/week: 0.0 standard drinks of alcohol   Drug use: No   Sexual activity: Yes  Birth control/protection: Surgical  Other Topics Concern   Not on file  Social History Narrative   Not on file   Social Determinants of Health   Financial Resource Strain: Low Risk  (09/24/2021)   Overall Financial Resource Strain (CARDIA)    Difficulty of Paying Living Expenses: Not hard at all  Food Insecurity: No Food Insecurity (02/24/2022)   Hunger Vital Sign    Worried About Running Out of Food in the Last Year: Never true    Waldo in the Last Year: Never true  Transportation Needs: Unknown (02/24/2022)   PRAPARE - Transportation    Lack of Transportation (Medical): Patient refused     Lack of Transportation (Non-Medical): Patient refused  Physical Activity: Inactive (09/18/2020)   Exercise Vital Sign    Days of Exercise per Week: 0 days    Minutes of Exercise per Session: 0 min  Stress: Stress Concern Present (09/24/2021)   [place]    Feeling of Stress : To some extent  Social Connections: Moderately Integrated (09/24/2021)   Social Connection and Isolation Panel [NHANES]    Frequency of Communication with Friends and Family: More than three times a week    Frequency of Social Gatherings with Friends and Family: Three times a week    Attends Religious Services: Never    Active Member of Clubs or Organizations: Yes    Attends Archivist Meetings: More than 4 times per year    Marital Status: Married  Human resources officer Violence: Unknown (02/24/2022)   Humiliation, Afraid, Rape, and Kick questionnaire    Fear of Current or Ex-Partner: Patient refused    Emotionally Abused: Patient refused    Physically Abused: Patient refused    Sexually Abused: Patient refused   Family Status  Relation Name Status   Mother  Deceased   Father  Deceased   Sister  Alive   Sister  (Not Specified)   Brother  Alive   Neg Hx  (Not Specified)   Family History  Problem Relation Age of Onset   Hypertension Mother    Hyperlipidemia Mother    Hypothyroidism Mother    Fibromyalgia Mother    Hearing loss Mother    Lung cancer Father    Coronary artery disease Father    Hyperlipidemia Father    Hypertension Father    Heart attack Father    Hearing loss Sister    Hyperlipidemia Sister    Hypertension Sister    Fibromyalgia Sister    Hemochromatosis Sister    ADD / ADHD Brother    Breast cancer Neg Hx    Allergies  Allergen Reactions   Enbrel [Etanercept] Hives    Other reaction(s): Muscle Cramps   Modafinil Rash   Augmentin  [Amoxicillin-Pot Clavulanate] Nausea And Vomiting    as stated (XR).    Cefdinir Diarrhea and Other (See Comments)    Other reaction(s): GI intolerance, Other (see comments) Gives her Cdiff Gives her Cdiff    Lisinopril Cough    Patient Care Team: Virginia Crews, MD as PCP - General (Family Medicine) Kate Sable, MD as PCP - Cardiology (Cardiology) Cammie Sickle, MD as Consulting Physician (Internal Medicine) Lonia Farber, MD as Consulting Physician (Internal Medicine) Allyson Sabal, NP as Nurse Practitioner (Pain Medicine) Pa, [place] (Optometry) Quintin Alto, MD as Consulting Physician (Rheumatology) Vladimir Crofts, MD as Consulting Physician (Neurology) [name], MD (Urology) Thornton Park, MD  as Referring Physician (Orthopedic Surgery) Pilar Jarvis, MD as Referring Physician (Pain Medicine) [place], Jimmy J, MD as Referring Physician (Physical Medicine and Rehabilitation) Clyde Canterbury, MD as Referring Physician (Otolaryngology)   Medications: Outpatient Medications Prior to Visit  Medication Sig   amLODipine (NORVASC) 5 MG tablet Take 1 tablet (5 mg total) by mouth daily.   aspirin 81 MG chewable tablet Chew 1 tablet (81 mg total) by mouth daily.   azelastine (ASTELIN) 0.1 % nasal spray Place 1 spray into both nostrils daily as needed.   Budeson-Glycopyrrol-Formoterol (BREZTRI AEROSPHERE) 160-9-4.8 MCG/ACT AERO Inhale 2 puffs into the lungs 2 (two) times daily.   clobetasol ointment (TEMOVATE) 0.05 % APP TOPICALLY TO GLUTEAL AREA ONCE D PRF DERMATITIS   COLLAGEN PO Take 30 mLs by mouth.   diclofenac Sodium (VOLTAREN) 1 % GEL Apply 2 g topically 4 (four) times daily.   DULoxetine (CYMBALTA) 20 MG capsule Cymbalta 20 mg capsule,delayed release  Take 1 capsule twice a day by oral route.   escitalopram (LEXAPRO) 20 MG tablet TAKE ONE TABLET BY MOUTH EVERY DAY   ezetimibe (ZETIA) 10 MG tablet Take 1 tablet (10 mg total) by mouth daily.   fluticasone (FLONASE) 50 MCG/ACT nasal spray Place 2  sprays into both nostrils daily.   gabapentin (NEURONTIN) 100 MG capsule Take 100 mg by mouth 2 (two) times daily as needed.   gabapentin (NEURONTIN) 100 MG capsule Take 1 capsule by mouth every morning.   gabapentin (NEURONTIN) 300 MG capsule Take 300 mg by mouth as needed (qhs). PRN   gentamicin ointment (GARAMYCIN) 0.1 % Apply 1 application. topically 3 (three) times daily.   hydrocortisone 2.5 % ointment Apply topically 2 (two) times daily.   loratadine (CLARITIN) 10 MG tablet Take 10 mg by mouth daily.   losartan (COZAAR) 25 MG tablet Take 1 tablet (25 mg total) by mouth daily.   methocarbamol (ROBAXIN) 750 MG tablet methocarbamol 750 mg tablet  Take 1 tablet 3 times a day by oral route for 30 days.   nitroGLYCERIN (NITROSTAT) 0.4 MG SL tablet Place 1 tablet (0.4 mg total) under the tongue every 5 (five) minutes as needed for chest pain.   nystatin (MYCOSTATIN) 100000 UNIT/ML suspension Use as directed 5 mLs (500,000 Units total) in the mouth or throat 4 (four) times daily.   ondansetron (ZOFRAN) 4 MG tablet TAKE ONE TABLET BY MOUTH EVERY EIGHT HOURS AS NEEDED FOR NAUSEA / VOMITING   oxyCODONE-acetaminophen (PERCOCET) 10-325 MG tablet Take 1 tablet by mouth every 4 (four) hours as needed for pain.   pantoprazole (PROTONIX) 40 MG tablet Take 1 tablet (40 mg total) by mouth 2 (two) times daily before a meal.   potassium chloride (KLOR-CON M) 10 MEQ tablet Take 2 tablets (20 mEq total) by mouth daily.   predniSONE (DELTASONE) 10 MG tablet Take 1 tablet by mouth daily.   rosuvastatin (CRESTOR) 5 MG tablet TAKE 1 TABLET BY MOUTH DAILY   traZODone (DESYREL) 100 MG tablet Take 0.5-1 tablets (50-100 mg total) by mouth at bedtime.   triamcinolone cream (KENALOG) 0.1 % Apply topically 2 (two) times daily as needed.   No facility-administered medications prior to visit.    Review of Systems  All other systems reviewed and are negative.   Last CBC Lab Results  Component Value Date   WBC 10.7  (H) 02/24/2022   HGB 15.0 02/24/2022   HCT 47.1 (H) 02/24/2022   MCV 100.9 (H) 02/24/2022   MCH 32.1 02/24/2022  RDW 12.2 02/24/2022   PLT 362 09/47/0962   Last metabolic panel Lab Results  Component Value Date   GLUCOSE 131 (H) 02/24/2022   NA 140 02/24/2022   K 3.5 02/24/2022   CL 103 02/24/2022   CO2 30 02/24/2022   BUN 13 02/24/2022   CREATININE 0.68 02/24/2022   GFRNONAA >60 02/24/2022   CALCIUM 8.8 (L) 02/24/2022   PROT 6.5 02/24/2022   ALBUMIN 3.9 02/24/2022   LABGLOB 2.1 08/09/2021   AGRATIO 2.2 08/09/2021   BILITOT 0.4 02/24/2022   ALKPHOS 48 02/24/2022   AST 19 02/24/2022   ALT 15 02/24/2022   ANIONGAP 7 02/24/2022   Last lipids Lab Results  Component Value Date   CHOL 185 02/24/2022   HDL 44 02/24/2022   LDLCALC 120 (H) 02/24/2022   TRIG 103 02/24/2022   CHOLHDL 4.2 02/24/2022   Last hemoglobin A1c Lab Results  Component Value Date   HGBA1C 6.0 (H) 02/24/2022   Last thyroid functions Lab Results  Component Value Date   TSH 0.453 10/08/2021   Last vitamin D Lab Results  Component Value Date   VD25OH 31.5 09/09/2019      Objective    There were no vitals taken for this visit. BP Readings from Last 3 Encounters:  04/25/22 (!) 142/74  03/19/22 (!) 163/79  03/06/22 120/69   Wt Readings from Last 3 Encounters:  04/25/22 116 lb 9.6 oz (52.9 kg)  03/19/22 115 lb (52.2 kg)  03/06/22 116 lb (52.6 kg)       Physical Exam  ***  Last depression screening scores    03/06/2022    2:20 PM 08/09/2021    1:16 PM 04/23/2021    8:40 AM  PHQ 2/9 Scores  PHQ - 2 Score 2 0 0  PHQ- 9 Score 4 4 0   Last fall risk screening    03/06/2022    2:20 PM  [place] in the past year? 0  Number falls in past yr: 0  Injury with Fall? 0  Risk for fall due to : No Fall Risks  Follow up Falls evaluation completed   Last Audit-C alcohol use screening    03/06/2022    2:20 PM  Alcohol Use Disorder Test (AUDIT)  1. How often do you have a  drink containing alcohol? 0  2. How many drinks containing alcohol do you have on a typical day when you are drinking? 0  3. How often do you have six or more drinks on one occasion? 0  AUDIT-C Score 0   A score of 3 or more in women, and 4 or more in men indicates increased risk for alcohol abuse, EXCEPT if all of the points are from question 1   No results found for any visits on 06/18/22.  Assessment & Plan    Routine Health Maintenance and Physical Exam  Exercise Activities and Dietary recommendations  Goals      DIET - EAT MORE FRUITS AND VEGETABLES     Exercise 150 minutes per week (moderate activity)     Recommend to exercise for 3 days a week for at least 30 minutes at a time.      Quit smoking / using tobacco        Immunization History  Administered Date(s) Administered   Fluad Quad(high Dose 65+) 04/23/2021   Influenza Split 02/21/2010, 04/22/2012, 07/15/2016   Influenza, High Dose Seasonal PF 03/05/2017, 07/08/2018   Influenza,inj,Quad PF,6+ Mos 03/23/2015  Moderna Sars-Covid-2 Vaccination 07/23/2019, 08/20/2019   Pneumococcal Conjugate-13 01/15/2017   Pneumococcal Polysaccharide-23 04/22/2012, 07/08/2018   Tdap 02/21/2010   Zoster, Live 11/04/2013    Health Maintenance  Topic Date Due   Zoster Vaccines- Shingrix (1 of 2) Never done   COVID-19 Vaccine (3 - Moderna risk series) 09/17/2019   DTaP/Tdap/Td (2 - Td or Tdap) 02/22/2020   DEXA SCAN  09/05/2021   INFLUENZA VACCINE  01/15/2022   Medicare Annual Wellness (AWV)  09/25/2022   Lung Cancer Screening  02/25/2023   MAMMOGRAM  03/06/2024   COLONOSCOPY (Pts 45-19yr Insurance coverage will need to be confirmed)  11/20/2027   Pneumonia Vaccine 70 Years old  Completed   Hepatitis C Screening  Completed   HPV VACCINES  Aged Out    Discussed health benefits of physical activity, and encouraged her to engage in regular exercise appropriate for her age and condition.  ***  No follow-ups on file.      {provider attestation***:1}   ALavon Paganini MD  [phone](phone) [phone](fax)  [place]

## 2022-06-18 ENCOUNTER — Encounter: Payer: PPO | Admitting: Family Medicine

## 2022-06-18 DIAGNOSIS — R7309 Other abnormal glucose: Secondary | ICD-10-CM

## 2022-06-18 DIAGNOSIS — I10 Essential (primary) hypertension: Secondary | ICD-10-CM

## 2022-06-18 DIAGNOSIS — Z0000 Encounter for general adult medical examination without abnormal findings: Secondary | ICD-10-CM

## 2022-06-18 DIAGNOSIS — E0590 Thyrotoxicosis, unspecified without thyrotoxic crisis or storm: Secondary | ICD-10-CM

## 2022-06-18 DIAGNOSIS — E7800 Pure hypercholesterolemia, unspecified: Secondary | ICD-10-CM

## 2022-06-28 ENCOUNTER — Other Ambulatory Visit: Payer: Self-pay | Admitting: Family Medicine

## 2022-06-28 DIAGNOSIS — E876 Hypokalemia: Secondary | ICD-10-CM

## 2022-06-28 NOTE — Telephone Encounter (Signed)
Requested medication (s) are due for refill today:   Provider to review  Requested medication (s) are on the active medication list:   Yes  Future visit scheduled:   No   Seen for hospital f/u by Tally Joe 3 months ago   Last ordered: 02/26/2022 #30, 0 refills  Returned because this was prescribed by a provider in the hospital.  Wasn't sure if she still needed to be on it.   Requested Prescriptions  Pending Prescriptions Disp Refills   potassium chloride (KLOR-CON M) 10 MEQ tablet [Pharmacy Med Name: POTASSIUM CHLORIDE CRYS ER 10 MEQ T] 30 tablet 0    Sig: TAKE TWO TABLETS BY [place] DAY     Endocrinology:  Minerals - Potassium Supplementation Passed - 06/28/2022 11:35 AM      Passed - K in normal range and within 360 days    Potassium  Date Value Ref Range Status  02/24/2022 3.5 3.5 - 5.1 mmol/L Final         Passed - Cr in normal range and within 360 days    Creat  Date Value Ref Range Status  05/05/2017 0.57 0.50 - 0.99 mg/dL Final    Comment:    For patients >49 years of age, the reference limit for Creatinine is approximately 13% higher for people identified as African-American. .    Creatinine, Ser  Date Value Ref Range Status  02/24/2022 0.68 0.44 - 1.00 mg/dL Final         Passed - Valid encounter within last 12 months    Recent Outpatient Visits           3 months ago Hospital discharge follow-up   [facility] Gwyneth Sprout, FNP   8 months ago Lichen planus of tongue   [facility] Gwyneth Sprout, FNP   10 months ago [place] Bacigal[place], Dionne Bucy, MD   1 year ago Hospital discharge follow-up   [facility] Gwyneth Sprout, [place]   1 year ago Skin rash   [facility] Gwyneth Sprout, FNP       Future Appointments             In 3 months Agbor-Etang, Aaron Edelman, MD [place]. [place]

## 2022-07-02 ENCOUNTER — Other Ambulatory Visit: Payer: Self-pay | Admitting: Family Medicine

## 2022-07-02 DIAGNOSIS — I252 Old myocardial infarction: Secondary | ICD-10-CM

## 2022-07-02 NOTE — Telephone Encounter (Signed)
Unable to refill per protocol, Rx request is too soon. Last refill 03/06/22 for 90 and 1 refill.  Requested Prescriptions  Pending Prescriptions Disp Refills   amLODipine (NORVASC) 5 MG tablet [Pharmacy Med Name: AMLODIPINE BESYLATE 5 MG TAB] 90 tablet 1    Sig: TAKE 1 TABLET BY MOUTH DAILY     Cardiovascular: Calcium Channel Blockers 2 Failed - 07/02/2022 11:02 AM      Failed - Last BP in normal range    BP Readings from Last 1 Encounters:  04/25/22 (!) 142/74         Passed - Last Heart Rate in normal range    Pulse Readings from Last 1 Encounters:  04/25/22 69         Passed - Valid encounter within last 6 months    Recent Outpatient Visits           3 months ago Hospital discharge follow-up   [facility] Gwyneth Sprout, FNP   8 months ago Lichen planus of tongue   [facility] Gwyneth Sprout, FNP   10 months ago [place] Bacigal[place], Dionne Bucy, MD   1 year ago Hospital discharge follow-up   [facility] Gwyneth Sprout, [place]   1 year ago Skin rash   [facility] Gwyneth Sprout, FNP       Future Appointments             In 3 months Agbor-Etang, Aaron Edelman, MD [place]. [place]

## 2022-07-19 ENCOUNTER — Other Ambulatory Visit: Payer: Self-pay | Admitting: *Deleted

## 2022-07-19 DIAGNOSIS — I10 Essential (primary) hypertension: Secondary | ICD-10-CM

## 2022-07-19 NOTE — Progress Notes (Signed)
re

## 2022-07-24 ENCOUNTER — Ambulatory Visit: Payer: Self-pay

## 2022-07-24 ENCOUNTER — Telehealth: Payer: PPO | Admitting: Physician Assistant

## 2022-07-24 DIAGNOSIS — U071 COVID-19: Secondary | ICD-10-CM | POA: Diagnosis not present

## 2022-07-24 MED ORDER — MOLNUPIRAVIR EUA 200MG CAPSULE: 4.0000 | ORAL_CAPSULE | Freq: Two times a day (BID) | ORAL | 0 refills | Status: AC

## 2022-07-24 MED ORDER — ONDANSETRON 4 MG PO TBDP: 4.0000 mg | ORAL_TABLET | Freq: Three times a day (TID) | ORAL | 0 refills | Status: DC | PRN

## 2022-07-24 NOTE — Telephone Encounter (Signed)
  Chief Complaint: COVID pos Symptoms: vomiting, nausea, nasal congestion, body aches Frequency: over the weekend Pertinent Negatives: Patient denies SOB or fever Disposition: '[]'$ ED /'[]'$ Urgent Care (no appt availability in office) / '[]'$ Appointment(In office/virtual)/ '[x]'$  [place] [facility]/ '[]'$ Home Care/ '[]'$ Refused Recommended Disposition /'[]'$ [place] [facility]/ '[]'$  Follow-up with PCP Additional Notes: pt requesting something for nausea d/t she is unable to eat and vomited Monday. Pt tested positive today. Advised since late in evening can do virtual UC. Pt agreed and scheduled appt for 1745 today. Care advice given and pt verbalized understanding.   Summary: Positive for COVID/nausea,vomiting.   Pt stated she tested positive for COVID. Pt stated she needs an Rx for nausea as she is vomiting. Pt stated her right nostril is bleeding some when blowing her nose. Pt stated her sinuses are very sore. Pt requesting Rx.  Seeking clinical advice.         Reason for Disposition  [1] COVID-19 diagnosed by doctor (or NP/PA) AND [2] mild symptoms (e.g., cough, fever, others) AND [6] no complications or SOB  Answer Assessment - Initial Assessment Questions 1. COVID-19 DIAGNOSIS: "How do you know that you have COVID?" (e.g., positive lab test or self-test, diagnosed by doctor or NP/PA, symptoms after exposure).     Home test today  3. ONSET: "When did the COVID-19 symptoms start?"      Weekend  4. WORST SYMPTOM: "What is your worst symptom?" (e.g., cough, fever, shortness of breath, muscle aches)     Vomiting  5. COUGH: "Do you have a cough?" If Yes, ask: "How bad is the cough?"       At times  6. FEVER: "Do you have a fever?" If Yes, ask: "What is your temperature, how was it measured, and when did it start?"     No  7. RESPIRATORY STATUS: "Describe your breathing?" (e.g., normal; shortness of breath, wheezing, unable to speak)      No SOB  9. OTHER SYMPTOMS: "Do you have any other symptoms?"   (e.g., chills, fatigue, headache, loss of smell or taste, muscle pain, sore throat)     Body aches, nasal congestion  11. VACCINE: "Have you had the COVID-19 vaccine?" If Yes, ask: "Which one, how many shots, when did you get it?"       1st 2  Protocols used: Coronavirus (COVID-19) Diagnosed or Suspected-A-AH

## 2022-07-24 NOTE — Progress Notes (Signed)
Virtual Visit Consent   April Best, you are scheduled for a virtual visit with a [facility] provider today. Just as with appointments in the office, your consent must be obtained to participate. Your consent will be active for this visit and any virtual visit you may have with one of our providers in the next 365 days. If you have a MyChart account, a copy of this consent can be sent to you electronically.  As this is a virtual visit, video technology does not allow for your provider to perform a traditional examination. This may limit your provider's ability to fully assess your condition. If your provider identifies any concerns that need to be evaluated in person or the need to arrange testing (such as labs, EKG, etc.), we will make arrangements to do so. Although advances in technology are sophisticated, we cannot ensure that it will always work on either your end or our end. If the connection with a video visit is poor, the visit may have to be switched to a telephone visit. With either a video or telephone visit, we are not always able to ensure that we have a secure connection.  By engaging in this virtual visit, you consent to the provision of healthcare and authorize for your insurance to be billed (if applicable) for the services provided during this visit. Depending on your insurance coverage, you may receive a charge related to this service.  I need to obtain your verbal consent now. Are you willing to proceed with your visit today? April Best has provided verbal consent on 07/24/2022 for a virtual visit (video or telephone). Leeanne Rio, [place]  Date: 07/24/2022 5:45 PM  Virtual Visit via Video Note   I, Leeanne Rio, connected with  April Best  ([id number], [birth year]) on 07/24/22 at  5:45 PM EST by a video-enabled telemedicine application and verified that I am speaking with the correct person using two identifiers.  Location: Patient: Virtual Visit  Location Patient: Home Provider: Virtual Visit Location Provider: Home Office   I discussed the limitations of evaluation and management by telemedicine and the availability of in person appointments. The patient expressed understanding and agreed to proceed.    History of Present Illness: April Best is a 72 y.o. who identifies as a female who was assigned female at birth, and is being seen today for COVID-19. Endorses symptoms starting on Monday with nausea/vomiting, body aches. Denies fever, chills. Some cough but nothing substantial. Some loose stools but that seems to be improving. Also had one episode of non-bloody emesis this morning, none since. Still with residual nausea. Is trying to hydrate, but just doing sips of ginger ale since the episode earlier. Tested positive for COVID this morning. Her husband is just getting over [place]. Started prednisone today due to flare of her RA.       HPI: HPI  Problems:  Patient Active Problem List   Diagnosis Date Noted   History of ST elevation myocardial infarction (STEMI) 03/06/2022   Gastroesophageal reflux disease    Chest pain of uncertain etiology 53/74/8270   Depression with anxiety 02/24/2022   RA (rheumatoid arthritis) ([place]) 02/24/2022   Upper abdominal pain 02/24/2022   Lung nodule 78/67/5449   Lichen planus of tongue 10/17/2021   Hyperthyroidism 10/17/2021   Wheezing 10/17/2021   Chronic obstructive pulmonary disease ([place]) 10/17/2021   Bilateral sacral insufficiency fracture 09/05/2021   Cervico-occipital neuralgia 09/05/2021   Hip pain 09/05/2021   Lumbar radiculopathy 09/05/2021  Nausea 09/05/2021   Osteoporosis 09/05/2021   Chronic pain syndrome 09/05/2021   Right facial numbness 08/09/2021   Aortic atherosclerosis ([place]) 08/09/2021   Recurrent major depressive disorder, in full remission ([place]) 08/09/2021   Senile purpura ([place]) 08/09/2021   Fibromyalgia 04/24/2021   Thrush of mouth and esophagus ([place]) 04/23/2021    Leukocytosis 04/23/2021   Hospital discharge follow-up 04/23/2021   Chronic fatigue 04/05/2021   Skin rash 04/05/2021   Hot flashes 04/05/2021   Pharyngoesophageal dysphagia 04/05/2021   Raised antibody titer 03/01/2020   Psoriatic arthritis ([place]) 01/25/2020   ANA positive 01/19/2020   Renal cyst 12/01/2019   Proteinuria 12/01/2019   Low back pain 07/12/2019   Pulmonary emphysema ([place]) 07/24/2018   Elevated glucose 02/13/2015   [name] ([place]) 12/14/2014   Family history of hemochromatosis 11/02/2014   Hypercholesteremia 11/02/2014   Essential hypertension 11/02/2014   Current tobacco use 11/02/2014   Episodic paroxysmal anxiety disorder 10/02/2007    Allergies:  Allergies  Allergen Reactions   Enbrel [Etanercept] Hives    Other reaction(s): Muscle Cramps   Modafinil Rash   Augmentin  [Amoxicillin-Pot Clavulanate] Nausea And Vomiting    as stated (XR).   Cefdinir Diarrhea and Other (See Comments)    Other reaction(s): GI intolerance, Other (see comments) Gives her Cdiff Gives her Cdiff    Lisinopril Cough   Medications:  Current Outpatient Medications:    molnupiravir EUA (LAGEVRIO) 200 mg CAPS capsule, Take 4 capsules (800 mg total) by mouth 2 (two) times daily for 5 days., Disp: 40 capsule, Rfl: 0   ondansetron (ZOFRAN-ODT) 4 MG disintegrating tablet, Take 1 tablet (4 mg total) by mouth every 8 (eight) hours as needed for nausea or vomiting., Disp: 20 tablet, Rfl: 0   amLODipine (NORVASC) 5 MG tablet, Take 1 tablet (5 mg total) by mouth daily., Disp: 90 tablet, Rfl: 1   aspirin 81 MG chewable tablet, Chew 1 tablet (81 mg total) by mouth daily., Disp: 30 tablet, Rfl: 0   azelastine (ASTELIN) 0.1 % nasal spray, Place 1 spray into both nostrils daily as needed., Disp: , Rfl:    Budeson-Glycopyrrol-Formoterol (BREZTRI AEROSPHERE) 160-9-4.8 MCG/ACT AERO, Inhale 2 puffs into the lungs 2 (two) times daily., Disp: 10.7 g, Rfl: 11   clobetasol ointment  (TEMOVATE) 0.05 %, APP TOPICALLY TO GLUTEAL AREA ONCE D PRF DERMATITIS, Disp: 30 g, Rfl: 1   COLLAGEN PO, Take 30 mLs by mouth., Disp: , Rfl:    diclofenac Sodium (VOLTAREN) 1 % GEL, Apply 2 g topically 4 (four) times daily., Disp: , Rfl:    DULoxetine (CYMBALTA) 20 MG capsule, Cymbalta 20 mg capsule,delayed release  Take 1 capsule twice a day by oral route., Disp: , Rfl:    escitalopram (LEXAPRO) 20 MG tablet, TAKE ONE TABLET BY MOUTH EVERY DAY, Disp: 90 tablet, Rfl: 1   ezetimibe (ZETIA) 10 MG tablet, Take 1 tablet (10 mg total) by mouth daily., Disp: 30 tablet, Rfl: 5   fluticasone (FLONASE) 50 MCG/ACT nasal spray, Place 2 sprays into both nostrils daily., Disp: , Rfl:    gabapentin (NEURONTIN) 100 MG capsule, Take 100 mg by mouth 2 (two) times daily as needed., Disp: , Rfl:    gabapentin (NEURONTIN) 100 MG capsule, Take 1 capsule by mouth every morning., Disp: , Rfl:    gabapentin (NEURONTIN) 300 MG capsule, Take 300 mg by mouth as needed (qhs). PRN, Disp: , Rfl:    gentamicin ointment (GARAMYCIN) 0.1 %, Apply 1 application. topically 3 (three) times  daily., Disp: , Rfl:    hydrocortisone 2.5 % ointment, Apply topically 2 (two) times daily., Disp: , Rfl:    loratadine (CLARITIN) 10 MG tablet, Take 10 mg by mouth daily., Disp: , Rfl:    losartan (COZAAR) 25 MG tablet, Take 1 tablet (25 mg total) by mouth daily., Disp: 90 tablet, Rfl: 1   methocarbamol (ROBAXIN) 750 MG tablet, methocarbamol 750 mg tablet  Take 1 tablet 3 times a day by oral route for 30 days., Disp: , Rfl:    nitroGLYCERIN (NITROSTAT) 0.4 MG SL tablet, Place 1 tablet (0.4 mg total) under the tongue every 5 (five) minutes as needed for chest pain., Disp: 30 tablet, Rfl: 12   nystatin (MYCOSTATIN) 100000 UNIT/ML suspension, Use as directed 5 mLs (500,000 Units total) in the mouth or throat 4 (four) times daily., Disp: 473 mL, Rfl: 0   oxyCODONE-acetaminophen (PERCOCET) 10-325 MG tablet, Take 1 tablet by mouth every 4 (four) hours as  needed for pain., Disp: , Rfl:    pantoprazole (PROTONIX) 40 MG tablet, Take 1 tablet (40 mg total) by mouth 2 (two) times daily before a meal., Disp: 60 tablet, Rfl: 1   potassium chloride (KLOR-CON M) 10 MEQ tablet, TAKE TWO TABLETS BY MOUTH EVERY DAY, Disp: 30 tablet, Rfl: 0   predniSONE (DELTASONE) 10 MG tablet, Take 1 tablet by mouth daily., Disp: , Rfl:    rosuvastatin (CRESTOR) 5 MG tablet, TAKE 1 TABLET BY MOUTH DAILY, Disp: 90 tablet, Rfl: 1   traZODone (DESYREL) 100 MG tablet, Take 0.5-1 tablets (50-100 mg total) by mouth at bedtime., Disp: 30 tablet, Rfl: 5   triamcinolone cream (KENALOG) 0.1 %, Apply topically 2 (two) times daily as needed., Disp: , Rfl:   Observations/Objective: Patient is well-developed, well-nourished in no acute distress.  Resting comfortably at home.  Head is normocephalic, atraumatic.  No labored breathing. Speech is clear and coherent with logical content.  Patient is alert and oriented at baseline.   Assessment and Plan: 1. COVID-19 - molnupiravir EUA (LAGEVRIO) 200 mg CAPS capsule; Take 4 capsules (800 mg total) by mouth 2 (two) times daily for 5 days.  Dispense: 40 capsule; Refill: 0 - ondansetron (ZOFRAN-ODT) 4 MG disintegrating tablet; Take 1 tablet (4 mg total) by mouth every 8 (eight) hours as needed for nausea or vomiting.  Dispense: 20 tablet; Refill: 0  Patient with multiple risk factors for complicated course of illness. Discussed risks/benefits of antiviral medications including most common potential ADRs. Patient voiced understanding and would like to proceed with antiviral medication. They are candidate for Molnupiravir. Rx sent to pharmacy. Supportive measures, OTC medications and vitamin regimen reviewed. Zofran for nausea. Patient has been enrolled in a MyChart COVID symptom monitoring program. Samule Dry reviewed in detail. Strict ER precautions discussed with patient.    Follow Up Instructions: I discussed the assessment and treatment  plan with the patient. The patient was provided an opportunity to ask questions and all were answered. The patient agreed with the plan and demonstrated an understanding of the instructions.  A copy of instructions were sent to the patient via MyChart unless otherwise noted below.   The patient was advised to call back or seek an in-person evaluation if the symptoms worsen or if the condition fails to improve as anticipated.  Time:  I spent 10 minutes with the patient via telehealth technology discussing the above problems/concerns.    Leeanne Rio, PA-C

## 2022-07-24 NOTE — Patient Instructions (Addendum)
[name], thank you for joining Leeanne Rio, PA-C for today's virtual visit.  While this provider is not your primary care provider (PCP), if your PCP is located in our provider database this encounter information will be shared with them immediately following your visit.   [facility] account gives you access to today's visit and all your visits, tests, and labs performed at [facility] " click here if you don't have a [facility] account or go to [email or web address]  Consent: (Patient) SHA BURLING provided verbal consent for this virtual visit at the beginning of the encounter.  Current Medications:  Current Outpatient Medications:    amLODipine (NORVASC) 5 MG tablet, Take 1 tablet (5 mg total) by mouth daily., Disp: 90 tablet, Rfl: 1   aspirin 81 MG chewable tablet, Chew 1 tablet (81 mg total) by mouth daily., Disp: 30 tablet, Rfl: 0   azelastine (ASTELIN) 0.1 % nasal spray, Place 1 spray into both nostrils daily as needed., Disp: , Rfl:    Budeson-Glycopyrrol-Formoterol (BREZTRI AEROSPHERE) 160-9-4.8 MCG/ACT AERO, Inhale 2 puffs into the lungs 2 (two) times daily., Disp: 10.7 g, Rfl: 11   clobetasol ointment (TEMOVATE) 0.05 %, APP TOPICALLY TO GLUTEAL AREA ONCE D PRF DERMATITIS, Disp: 30 g, Rfl: 1   COLLAGEN PO, Take 30 mLs by mouth., Disp: , Rfl:    diclofenac Sodium (VOLTAREN) 1 % GEL, Apply 2 g topically 4 (four) times daily., Disp: , Rfl:    DULoxetine (CYMBALTA) 20 MG capsule, Cymbalta 20 mg capsule,delayed release  Take 1 capsule twice a day by oral route., Disp: , Rfl:    escitalopram (LEXAPRO) 20 MG tablet, TAKE ONE TABLET BY MOUTH EVERY DAY, Disp: 90 tablet, Rfl: 1   ezetimibe (ZETIA) 10 MG tablet, Take 1 tablet (10 mg total) by mouth daily., Disp: 30 tablet, Rfl: 5   fluticasone (FLONASE) 50 MCG/ACT nasal spray, Place 2 sprays into both nostrils daily., Disp: , Rfl:    gabapentin (NEURONTIN) 100 MG capsule, Take 100 mg by  mouth 2 (two) times daily as needed., Disp: , Rfl:    gabapentin (NEURONTIN) 100 MG capsule, Take 1 capsule by mouth every morning., Disp: , Rfl:    gabapentin (NEURONTIN) 300 MG capsule, Take 300 mg by mouth as needed (qhs). PRN, Disp: , Rfl:    gentamicin ointment (GARAMYCIN) 0.1 %, Apply 1 application. topically 3 (three) times daily., Disp: , Rfl:    hydrocortisone 2.5 % ointment, Apply topically 2 (two) times daily., Disp: , Rfl:    loratadine (CLARITIN) 10 MG tablet, Take 10 mg by mouth daily., Disp: , Rfl:    losartan (COZAAR) 25 MG tablet, Take 1 tablet (25 mg total) by mouth daily., Disp: 90 tablet, Rfl: 1   methocarbamol (ROBAXIN) 750 MG tablet, methocarbamol 750 mg tablet  Take 1 tablet 3 times a day by oral route for 30 days., Disp: , Rfl:    nitroGLYCERIN (NITROSTAT) 0.4 MG SL tablet, Place 1 tablet (0.4 mg total) under the tongue every 5 (five) minutes as needed for chest pain., Disp: 30 tablet, Rfl: 12   nystatin (MYCOSTATIN) 100000 UNIT/ML suspension, Use as directed 5 mLs (500,000 Units total) in the mouth or throat 4 (four) times daily., Disp: 473 mL, Rfl: 0   ondansetron (ZOFRAN) 4 MG tablet, TAKE ONE TABLET BY MOUTH EVERY EIGHT HOURS AS NEEDED FOR NAUSEA / VOMITING, Disp: 30 tablet, Rfl: 0   oxyCODONE-acetaminophen (PERCOCET) 10-325 MG tablet, Take 1 tablet by mouth  every 4 (four) hours as needed for pain., Disp: , Rfl:    pantoprazole (PROTONIX) 40 MG tablet, Take 1 tablet (40 mg total) by mouth 2 (two) times daily before a meal., Disp: 60 tablet, Rfl: 1   potassium chloride (KLOR-CON M) 10 MEQ tablet, TAKE TWO TABLETS BY MOUTH EVERY DAY, Disp: 30 tablet, Rfl: 0   predniSONE (DELTASONE) 10 MG tablet, Take 1 tablet by mouth daily., Disp: , Rfl:    rosuvastatin (CRESTOR) 5 MG tablet, TAKE 1 TABLET BY MOUTH DAILY, Disp: 90 tablet, Rfl: 1   traZODone (DESYREL) 100 MG tablet, Take 0.5-1 tablets (50-100 mg total) by mouth at bedtime., Disp: 30 tablet, Rfl: 5   triamcinolone cream  (KENALOG) 0.1 %, Apply topically 2 (two) times daily as needed., Disp: , Rfl:    Medications ordered in this encounter:  No orders of the defined types were placed in this encounter.    *If you need refills on other medications prior to your next appointment, please contact your pharmacy*  Follow-Up: Call back or seek an in-person evaluation if the symptoms worsen or if the condition fails to improve as anticipated.  [facility] [phone]  Other Instructions Please keep well-hydrated and get plenty of rest. Start a saline nasal rinse to flush out your nasal passages. You can use plain Mucinex to help thin congestion. If you have a humidifier, running in the bedroom at night. I want you to start OTC vitamin D3 1000 units daily, vitamin C 1000 mg daily, and a zinc supplement. Please take prescribed medications as directed.  You were to quarantine for 5 days from onset of your symptoms.  After day 5, if you have had no fever and you are feeling better, you can end quarantine but need to mask for an additional 5 days. After day 5 if you have a fever or are having significant symptoms, please quarantine for full 10 days.  If you note any worsening of symptoms, any significant shortness of breath or any chest pain, please seek ER evaluation ASAP.  Please do not delay care!  COVID-19: What to Do if You Are Sick If you test positive and are an older adult or someone who is at high risk of getting very sick from COVID-19, treatment may be available. Contact a healthcare provider right away after a positive test to determine if you are eligible, even if your symptoms are mild right now. You can also visit a Test to Treat location and, if eligible, receive a prescription from a provider. Don't delay: Treatment must be started within the first few days to be effective. If you have a fever, cough, or other symptoms, you might have COVID-19. Most people have mild illness and are able  to recover at home. If you are sick: Keep track of your symptoms. If you have an emergency warning sign (including trouble breathing), call 911. Steps to help prevent the spread of COVID-19 if you are sick If you are sick with COVID-19 or think you might have COVID-19, follow the steps below to care for yourself and to help protect other people in your home and community. Stay home except to get medical care Stay home. Most people with COVID-19 have mild illness and can recover at home without medical care. Do not leave your home, except to get medical care. Do not visit public areas and do not go to places where you are unable to wear a mask. Take care of yourself. Get rest  and stay hydrated. Take over-the-counter medicines, such as acetaminophen, to help you feel better. Stay in touch with your doctor. Call before you get medical care. Be sure to get care if you have trouble breathing, or have any other emergency warning signs, or if you think it is an emergency. Avoid public transportation, ride-sharing, or taxis if possible. Get tested If you have symptoms of COVID-19, get tested. While waiting for test results, stay away from others, including staying apart from those living in your household. Get tested as soon as possible after your symptoms start. Treatments may be available for people with COVID-19 who are at risk for becoming very sick. Don't delay: Treatment must be started early to be effective--some treatments must begin within 5 days of your first symptoms. Contact your healthcare provider right away if your test result is positive to determine if you are eligible. Self-tests are one of several options for testing for the virus that causes COVID-19 and may be more convenient than laboratory-based tests and point-of-care tests. Ask your healthcare provider or your local health department if you need help interpreting your test results. You can visit your state, tribal, local, and  territorial health department's website to look for the latest local information on testing sites. Separate yourself from other people As much as possible, stay in a specific room and away from other people and pets in your home. If possible, you should use a separate bathroom. If you need to be around other people or animals in or outside of the home, wear a well-fitting mask. Tell your close contacts that they may have been exposed to COVID-19. An infected person can spread COVID-19 starting 48 hours (or 2 days) before the person has any symptoms or tests positive. By letting your close contacts know they may have been exposed to COVID-19, you are helping to protect everyone. See COVID-19 and Animals if you have questions about pets. If you are diagnosed with COVID-19, someone from the health department may call you. Answer the call to slow the spread. Monitor your symptoms Symptoms of COVID-19 include fever, cough, or other symptoms. Follow care instructions from your healthcare provider and local health department. Your local health authorities may give instructions on checking your symptoms and reporting information. When to seek emergency medical attention Look for emergency warning signs* for COVID-19. If someone is showing any of these signs, seek emergency medical care immediately: Trouble breathing Persistent pain or pressure in the chest New confusion Inability to wake or stay awake Pale, gray, or blue-colored skin, lips, or nail beds, depending on skin tone *This list is not all possible symptoms. Please call your medical provider for any other symptoms that are severe or concerning to you. Call 911 or call ahead to your local emergency facility: Notify the operator that you are seeking care for someone who has or may have COVID-19. Call ahead before visiting your doctor Call ahead. Many medical visits for routine care are being postponed or done by phone or telemedicine. If you have  a medical appointment that cannot be postponed, call your doctor's office, and tell them you have or may have COVID-19. This will help the office protect themselves and other patients. If you are sick, wear a well-fitting mask You should wear a mask if you must be around other people or animals, including pets (even at home). Wear a mask with the best fit, protection, and comfort for you. You don't need to wear the mask if you are alone.  If you can't put on a mask (because of trouble breathing, for example), cover your coughs and sneezes in some other way. Try to stay at least 6 feet away from other people. This will help protect the people around you. Masks should not be placed on young children under age 32 years, anyone who has trouble breathing, or anyone who is not able to remove the mask without help. Cover your coughs and sneezes Cover your mouth and nose with a tissue when you cough or sneeze. Throw away used tissues in a lined trash can. Immediately wash your hands with soap and water for at least 20 seconds. If soap and water are not available, clean your hands with an alcohol-based hand sanitizer that contains at least 60% alcohol. Clean your hands often Wash your hands often with soap and water for at least 20 seconds. This is especially important after blowing your nose, coughing, or sneezing; going to the bathroom; and before eating or preparing food. Use hand sanitizer if soap and water are not available. Use an alcohol-based hand sanitizer with at least 60% alcohol, covering all surfaces of your hands and rubbing them together until they feel dry. Soap and water are the best option, especially if hands are visibly dirty. Avoid touching your eyes, nose, and mouth with unwashed hands. Handwashing Tips Avoid sharing personal household items Do not share dishes, drinking glasses, cups, eating utensils, towels, or bedding with other people in your home. Wash these items thoroughly after  using them with soap and water or put in the dishwasher. Clean surfaces in your home regularly Clean and disinfect high-touch surfaces (for example, doorknobs, tables, handles, light switches, and countertops) in your "sick room" and bathroom. In shared spaces, you should clean and disinfect surfaces and items after each use by the person who is ill. If you are sick and cannot clean, a caregiver or other person should only clean and disinfect the area around you (such as your bedroom and bathroom) on an as needed basis. Your caregiver/other person should wait as long as possible (at least several hours) and wear a mask before entering, cleaning, and disinfecting shared spaces that you use. Clean and disinfect areas that may have blood, stool, or body fluids on them. Use household cleaners and disinfectants. Clean visible dirty surfaces with household cleaners containing soap or detergent. Then, use a household disinfectant. Use a product from [employer]: Disinfectants for Coronavirus (XBDZH-29). Be sure to follow the instructions on the label to ensure safe and effective use of the product. Many products recommend keeping the surface wet with a disinfectant for a certain period of time (look at "contact time" on the product label). You may also need to wear personal protective equipment, such as gloves, depending on the directions on the product label. Immediately after disinfecting, wash your hands with soap and water for 20 seconds. For completed guidance on cleaning and disinfecting your home, visit Complete Disinfection Guidance. Take steps to improve ventilation at home Improve ventilation (air flow) at home to help prevent from spreading COVID-19 to other people in your household. Clear out COVID-19 virus particles in the air by opening windows, using air filters, and turning on fans in your home. Use this interactive tool to learn how to improve air flow in your home. When you can be around  others after being sick with COVID-19 Deciding when you can be around others is different for different situations. Find out when you can safely end home isolation.  For any additional questions about your care, contact your healthcare provider or state or local health department. 09/05/2020 Content source: [facility] ([facility]), Division of Viral Diseases This information is not intended to replace advice given to you by your health care provider. Make sure you discuss any questions you have with your health care provider. Document Revised: 10/19/2020 Document Reviewed: 10/19/2020 Elsevier Patient Education  2022 [employer].   If you have been instructed to have an in-person evaluation today at a local Urgent Care facility, please use the link below. It will take you to a list of all of our available [facility], including address, phone number and hours of operation. Please do not delay care.  [facility]  If you or a family member do not have a primary care provider, use the link below to schedule a visit and establish care. When you choose a [facility] primary care physician or advanced practice provider, you gain a long-term partner in health. Find a Primary Care Provider  Learn more about [facility] in-office and virtual care options: [facility]

## 2022-07-24 NOTE — Progress Notes (Deleted)
I,April Best,acting as a Education administrator for April Best.,have documented all relevant documentation on the behalf of April Best,as directed by  April Best while in the presence of April Best.    Complete physical exam   Patient: April Best   DOB: Nov 07, 1950   72 y.o. Female  MRN: [id number] Visit Date: 07/25/2022  Today's healthcare provider: Lavon Paganini, Best   No chief complaint on file.  Subjective    April Best is a 72 y.o. female who presents today for a complete physical exam.  She reports consuming a {diet types:17450} diet. {Exercise:19826} She generally feels {well/fairly well/poorly:18703}. She reports sleeping {well/fairly well/poorly:18703}. She {does/does not:200015} have additional problems to discuss today.  HPI  09/24/2021 AWV Shingles vaccine- Flu vaccine-   Past Medical History:  Diagnosis Date   Allergic rhinitis    Allergy    Annular oral lichen planus    Anxiety    Arthritis    Depression    Deviated nasal septum    nasal obstruction   Guaiac positive stools    history   H. 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   ABDOMINOPLASTY     APPENDECTOMY     BREAST BIOPSY Right    neg- core   COLONOSCOPY  03/2006   COLONOSCOPY WITH PROPOFOL N/A 11/19/2017   Procedure: COLONOSCOPY WITH PROPOFOL;  Surgeon: April Best;  Location: [facility] ENDOSCOPY;  Service: Gastroenterology;  Laterality: N/A;   CORONARY/GRAFT ACUTE MI REVASCULARIZATION N/A 02/24/2022   Procedure: Coronary/Graft Acute MI Revascularization;  Surgeon: April Best;  Location: [place] [facility];  Service: Cardiovascular;  Laterality: N/A;   ENDOSCOPIC CONCHA BULLOSA RESECTION Right 01/30/2021   Procedure: ENDOSCOPIC CONCHA BULLOSA RESECTION;  Surgeon: April Best;  Location: [place];  Service: ENT;  Laterality: Right;   ESOPHAGOGASTRODUODENOSCOPY N/A 03/19/2022   Procedure: ESOPHAGOGASTRODUODENOSCOPY (EGD);  Surgeon: April Best;  Location: [facility] ENDOSCOPY;  Service: Gastroenterology;  Laterality: N/A;   LEFT HEART CATH AND CORONARY ANGIOGRAPHY N/A 02/24/2022   Procedure: LEFT HEART CATH AND CORONARY ANGIOGRAPHY;  Surgeon: April Best;  Location: [place] [facility];  Service: Cardiovascular;  Laterality: N/A;   RHINOPLASTY  2002   SEPTOPLASTY N/A 01/30/2021   Procedure: SEPTOPLASTY;  Surgeon: April Best;  Location: [place];  Service: ENT;  Laterality: N/A;   UPPER GI ENDOSCOPY  2008   Social History   Socioeconomic History   Marital status: Married    Spouse name: Not on file   Number of children: 1   Years of education: Not on file   Highest education level: High school graduate  Occupational History   Occupation: retired  Tobacco Use   Smoking status: Every Day    Packs/day: 1.00    Years: 49.00    Total pack years: 49.00    Types: Cigarettes   Smokeless tobacco: Never   Tobacco comments:    pt declines smoking cessation ( smoked since age 68)  Vaping Use   Vaping Use: Never used  Substance and Sexual Activity   Alcohol use: Not Currently    Alcohol/week: 0.0 standard drinks of alcohol   Drug use: No   Sexual activity: Yes    Birth control/protection: Surgical  Other Topics Concern   Not on file  Social History Narrative   Not on file   Social Determinants of Health   Financial Resource Strain: Low Risk  (09/24/2021)   Overall Financial Resource Strain (CARDIA)    Difficulty of Paying Living Expenses: Not hard at all  Food Insecurity: No Food Insecurity  (02/24/2022)   Hunger Vital Sign    Worried About Running Out of Food in the Last Year: Never true    [place] in the Last Year: Never true  Transportation Needs: Unknown (02/24/2022)   PRAPARE - Transportation    Lack of Transportation (Medical): Patient refused    Lack of Transportation (Non-Medical): Patient refused  Physical Activity: Inactive (09/18/2020)   Exercise Vital Sign    Days of Exercise per Week: 0 days    Minutes of Exercise per Session: 0 min  Stress: Stress Concern Present (09/24/2021)   [place]    Feeling of Stress : To some extent  Social Connections: Moderately Integrated (09/24/2021)   Social Connection and Isolation Panel [NHANES]    Frequency of Communication with Friends and Family: More than three times a week    Frequency of Social Gatherings with Friends and Family: Three times a week    Attends Religious Services: Never    Active Member of Clubs or Organizations: Yes    Attends Archivist Meetings: More than 4 times per year    Marital Status: Married  Human resources officer Violence: Unknown (02/24/2022)   Humiliation, Afraid, Rape, and Kick questionnaire    Fear of Current or Ex-Partner: Patient refused    Emotionally Abused: Patient refused    Physically Abused: Patient refused    Sexually Abused: Patient refused   Family Status  Relation Name Status   Mother  Deceased   Father  Deceased   Sister  Alive   Sister  (Not Specified)   Brother  Alive   Neg Hx  (Not Specified)   Family History  Problem Relation Age of Onset   Hypertension Mother    Hyperlipidemia Mother    Hypothyroidism Mother    Fibromyalgia Mother    Hearing loss Mother    Lung cancer Father    Coronary artery disease Father    Hyperlipidemia Father    Hypertension Father    Heart attack Father    Hearing loss Sister    Hyperlipidemia Sister    Hypertension Sister    Fibromyalgia Sister     Hemochromatosis Sister    ADD / ADHD Brother    Breast cancer Neg Hx    Allergies  Allergen Reactions   Enbrel [Etanercept] Hives    Other reaction(s): Muscle Cramps   Modafinil Rash   Augmentin  [Amoxicillin-Pot Clavulanate] Nausea And Vomiting    as stated (XR).   Cefdinir Diarrhea and Other (See Comments)    Other reaction(s): GI intolerance, Other (see comments) Gives her Cdiff Gives her Cdiff    Lisinopril Cough    Patient Care Team: Virginia Crews, Best as PCP - General (Family Medicine) Kate Sable, Best as PCP - Cardiology (Cardiology) Cammie Sickle, Best as Consulting Physician (Internal Medicine) Lonia Farber, Best as  Consulting Physician (Internal Medicine) Allyson Sabal, NP as Nurse Practitioner (Pain Medicine) Pa, [place] (Optometry) Quintin Alto, Best as Consulting Physician (Rheumatology) Vladimir Crofts, Best as Consulting Physician (Neurology) [name], Best (Urology) [place], Best as Referring Physician (Orthopedic Surgery) Pilar Jarvis, Best as Referring Physician (Pain Medicine) [place], Jimmy J, Best as Referring Physician (Physical Medicine and Rehabilitation) April Best as Referring Physician (Otolaryngology)   Medications: Outpatient Medications Prior to Visit  Medication Sig   amLODipine (NORVASC) 5 MG tablet Take 1 tablet (5 mg total) by mouth daily.   aspirin 81 MG chewable tablet Chew 1 tablet (81 mg total) by mouth daily.   azelastine (ASTELIN) 0.1 % nasal spray Place 1 spray into both nostrils daily as needed.   Budeson-Glycopyrrol-Formoterol (BREZTRI AEROSPHERE) 160-9-4.8 MCG/ACT AERO Inhale 2 puffs into the lungs 2 (two) times daily.   clobetasol ointment (TEMOVATE) 0.05 % APP TOPICALLY TO GLUTEAL AREA ONCE D PRF DERMATITIS   COLLAGEN PO Take 30 mLs by mouth.   diclofenac Sodium (VOLTAREN) 1 % GEL Apply 2 g topically 4 (four) times daily.   DULoxetine (CYMBALTA) 20 MG capsule Cymbalta 20  mg capsule,delayed release  Take 1 capsule twice a day by oral route.   escitalopram (LEXAPRO) 20 MG tablet TAKE ONE TABLET BY MOUTH EVERY DAY   ezetimibe (ZETIA) 10 MG tablet Take 1 tablet (10 mg total) by mouth daily.   fluticasone (FLONASE) 50 MCG/ACT nasal spray Place 2 sprays into both nostrils daily.   gabapentin (NEURONTIN) 100 MG capsule Take 100 mg by mouth 2 (two) times daily as needed.   gabapentin (NEURONTIN) 100 MG capsule Take 1 capsule by mouth every morning.   gabapentin (NEURONTIN) 300 MG capsule Take 300 mg by mouth as needed (qhs). PRN   gentamicin ointment (GARAMYCIN) 0.1 % Apply 1 application. topically 3 (three) times daily.   hydrocortisone 2.5 % ointment Apply topically 2 (two) times daily.   loratadine (CLARITIN) 10 MG tablet Take 10 mg by mouth daily.   losartan (COZAAR) 25 MG tablet Take 1 tablet (25 mg total) by mouth daily.   methocarbamol (ROBAXIN) 750 MG tablet methocarbamol 750 mg tablet  Take 1 tablet 3 times a day by oral route for 30 days.   nitroGLYCERIN (NITROSTAT) 0.4 MG SL tablet Place 1 tablet (0.4 mg total) under the tongue every 5 (five) minutes as needed for chest pain.   nystatin (MYCOSTATIN) 100000 UNIT/ML suspension Use as directed 5 mLs (500,000 Units total) in the mouth or throat 4 (four) times daily.   ondansetron (ZOFRAN) 4 MG tablet TAKE ONE TABLET BY MOUTH EVERY EIGHT HOURS AS NEEDED FOR NAUSEA / VOMITING   oxyCODONE-acetaminophen (PERCOCET) 10-325 MG tablet Take 1 tablet by mouth every 4 (four) hours as needed for pain.   pantoprazole (PROTONIX) 40 MG tablet Take 1 tablet (40 mg total) by mouth 2 (two) times daily before a meal.   potassium chloride (KLOR-CON M) 10 MEQ tablet TAKE TWO TABLETS BY MOUTH EVERY DAY   predniSONE (DELTASONE) 10 MG tablet Take 1 tablet by mouth daily.   rosuvastatin (CRESTOR) 5 MG tablet TAKE 1 TABLET BY MOUTH DAILY   traZODone (DESYREL) 100 MG tablet Take 0.5-1 tablets (50-100 mg total) by mouth at bedtime.    triamcinolone cream (KENALOG) 0.1 % Apply topically 2 (two) times daily as needed.   No facility-administered medications prior to visit.    Review of Systems  All other systems reviewed and are negative.   {  Labs  Heme  Chem  Endocrine  Serology  Results Review (optional):23779}  Objective    There were no vitals taken for this visit. {Show previous vital signs (optional):23777}   Physical Exam  ***  Last depression screening scores    03/06/2022    2:20 PM 08/09/2021    1:16 PM 04/23/2021    8:40 AM  PHQ 2/9 Scores  PHQ - 2 Score 2 0 0  PHQ- 9 Score 4 4 0   Last fall risk screening    03/06/2022    2:20 PM  [place] in the past year? 0  Number falls in past yr: 0  Injury with Fall? 0  Risk for fall due to : No Fall Risks  Follow up Falls evaluation completed   Last Audit-C alcohol use screening    03/06/2022    2:20 PM  Alcohol Use Disorder Test (AUDIT)  1. How often do you have a drink containing alcohol? 0  2. How many drinks containing alcohol do you have on a typical day when you are drinking? 0  3. How often do you have six or more drinks on one occasion? 0  AUDIT-C Score 0   A score of 3 or more in women, and 4 or more in men indicates increased risk for alcohol abuse, EXCEPT if all of the points are from question 1   No results found for any visits on 07/25/22.  Assessment & Plan    Routine Health Maintenance and Physical Exam  Exercise Activities and Dietary recommendations  Goals      DIET - EAT MORE FRUITS AND VEGETABLES     Exercise 150 minutes per week (moderate activity)     Recommend to exercise for 3 days a week for at least 30 minutes at a time.      Quit smoking / using tobacco        Immunization History  Administered Date(s) Administered   Fluad Quad(high Dose 65+) 04/23/2021   Influenza Split 02/21/2010, 04/22/2012, 07/15/2016   Influenza, High Dose Seasonal PF 03/05/2017, 07/08/2018   Influenza,inj,Quad PF,6+ Mos  03/23/2015   Moderna Sars-Covid-2 Vaccination 07/23/2019, 08/20/2019   Pneumococcal Conjugate-13 01/15/2017   Pneumococcal Polysaccharide-23 04/22/2012, 07/08/2018   Tdap 02/21/2010   Zoster, Live 11/04/2013    Health Maintenance  Topic Date Due   Zoster Vaccines- Shingrix (1 of 2) Never done   COVID-19 Vaccine (3 - Moderna risk series) 09/17/2019   DTaP/Tdap/Td (2 - Td or Tdap) 02/22/2020   DEXA SCAN  09/05/2021   INFLUENZA VACCINE  01/15/2022   Medicare Annual Wellness (AWV)  09/25/2022   Lung Cancer Screening  02/25/2023   MAMMOGRAM  03/06/2024   COLONOSCOPY (Pts 45-47yr Insurance coverage will need to be confirmed)  11/20/2027   Pneumonia Vaccine 72 Years old  Completed   Hepatitis C Screening  Completed   HPV VACCINES  Aged Out    Discussed health benefits of physical activity, and encouraged her to engage in regular exercise appropriate for her age and condition.  ***  No follow-ups on file.     {provider attestation***:1}   ALavon Paganini Best  [phone](phone) [phone](fax)  [place]

## 2022-07-25 ENCOUNTER — Encounter: Payer: PPO | Admitting: Family Medicine

## 2022-07-31 ENCOUNTER — Ambulatory Visit: Payer: Self-pay | Admitting: *Deleted

## 2022-07-31 ENCOUNTER — Encounter: Payer: Self-pay | Admitting: Family Medicine

## 2022-07-31 ENCOUNTER — Ambulatory Visit (INDEPENDENT_AMBULATORY_CARE_PROVIDER_SITE_OTHER): Payer: PPO | Admitting: Family Medicine

## 2022-07-31 ENCOUNTER — Inpatient Hospital Stay: Payer: PPO | Attending: Internal Medicine

## 2022-07-31 VITALS — BP 188/79 | HR 67 | Temp 98.0°F | Resp 16 | Wt 116.6 lb

## 2022-07-31 DIAGNOSIS — R062 Wheezing: Secondary | ICD-10-CM | POA: Diagnosis not present

## 2022-07-31 DIAGNOSIS — F1021 Alcohol dependence, in remission: Secondary | ICD-10-CM | POA: Diagnosis not present

## 2022-07-31 DIAGNOSIS — E279 Disorder of adrenal gland, unspecified: Secondary | ICD-10-CM | POA: Diagnosis not present

## 2022-07-31 DIAGNOSIS — Z720 Tobacco use: Secondary | ICD-10-CM | POA: Diagnosis not present

## 2022-07-31 DIAGNOSIS — L4050 Arthropathic psoriasis, unspecified: Secondary | ICD-10-CM | POA: Insufficient documentation

## 2022-07-31 DIAGNOSIS — Z801 Family history of malignant neoplasm of trachea, bronchus and lung: Secondary | ICD-10-CM | POA: Diagnosis not present

## 2022-07-31 DIAGNOSIS — I252 Old myocardial infarction: Secondary | ICD-10-CM

## 2022-07-31 DIAGNOSIS — R110 Nausea: Secondary | ICD-10-CM

## 2022-07-31 DIAGNOSIS — U071 COVID-19: Secondary | ICD-10-CM

## 2022-07-31 DIAGNOSIS — J439 Emphysema, unspecified: Secondary | ICD-10-CM | POA: Diagnosis not present

## 2022-07-31 DIAGNOSIS — J449 Chronic obstructive pulmonary disease, unspecified: Secondary | ICD-10-CM | POA: Diagnosis not present

## 2022-07-31 DIAGNOSIS — F17210 Nicotine dependence, cigarettes, uncomplicated: Secondary | ICD-10-CM | POA: Insufficient documentation

## 2022-07-31 LAB — CBC WITH DIFFERENTIAL/PLATELET
Abs Immature Granulocytes: 0.34 10*3/uL — ABNORMAL HIGH (ref 0.00–0.07)
Basophils Absolute: 0.1 10*3/uL (ref 0.0–0.1)
Basophils Relative: 1 %
Eosinophils Absolute: 0 10*3/uL (ref 0.0–0.5)
Eosinophils Relative: 0 %
HCT: 47.5 % — ABNORMAL HIGH (ref 36.0–46.0)
Hemoglobin: 15.3 g/dL — ABNORMAL HIGH (ref 12.0–15.0)
Immature Granulocytes: 3 %
Lymphocytes Relative: 27 %
Lymphs Abs: 3 10*3/uL (ref 0.7–4.0)
MCH: 32 pg (ref 26.0–34.0)
MCHC: 32.2 g/dL (ref 30.0–36.0)
MCV: 99.4 fL (ref 80.0–100.0)
Monocytes Absolute: 0.9 10*3/uL (ref 0.1–1.0)
Monocytes Relative: 8 %
Neutro Abs: 6.7 10*3/uL (ref 1.7–7.7)
Neutrophils Relative %: 61 %
Platelets: 357 10*3/uL (ref 150–400)
RBC: 4.78 MIL/uL (ref 3.87–5.11)
RDW: 13.4 % (ref 11.5–15.5)
WBC: 11 10*3/uL — ABNORMAL HIGH (ref 4.0–10.5)
nRBC: 0 % (ref 0.0–0.2)

## 2022-07-31 LAB — IRON AND TIBC
Iron: 90 ug/dL (ref 28–170)
Saturation Ratios: 31 % (ref 10.4–31.8)
TIBC: 295 ug/dL (ref 250–450)
UIBC: 205 ug/dL

## 2022-07-31 LAB — FERRITIN: Ferritin: 171 ng/mL (ref 11–307)

## 2022-07-31 MED ORDER — PREDNISONE 20 MG PO TABS: 40.0000 mg | ORAL_TABLET | Freq: Every day | ORAL | 0 refills | Status: AC

## 2022-07-31 MED ORDER — BENZONATATE 200 MG PO CAPS: 200.0000 mg | ORAL_CAPSULE | Freq: Two times a day (BID) | ORAL | 0 refills | Status: DC | PRN

## 2022-07-31 MED ORDER — DOXYCYCLINE HYCLATE 100 MG PO TABS: 100.0000 mg | ORAL_TABLET | Freq: Two times a day (BID) | ORAL | 0 refills | Status: AC

## 2022-07-31 NOTE — Telephone Encounter (Signed)
  Chief Complaint: severe right sided chest pain radiating down under my arm and into my upper back. Symptoms: Sweating, shortness of breath, dizziness Frequency: Started Monday.   She is taking large doses of opioid pain medication to get it to ease down. (Was admitted to hospital in Dec. 2023 due to STEMI but per pt cardiologist said she did not have an MI but she was in ICU with right sided chest pain).   Pertinent Negatives: Patient denies it being her heart. Disposition: '[x]'$ ED /'[]'$ Urgent Care (no appt availability in office) / '[]'$ Appointment(In office/virtual)/ '[]'$  [facility]/ '[]'$ Home Care/ '[]'$ Refused Recommended Disposition /'[]'$ [facility]/ '[]'$  Follow-up with PCP Additional Notes: Pt refusing to go to ED because she has been there and then saw a cardiologist that ruled out her having an MI.    Pt requesting an appt.    Dr. Brita Romp is out of the office today and her CMA is with another provider.   The phone line disconnected while I was talking with Verdene Lennert at [facility].   I called [facility] back and spoke with [place] again.   I let her know I was going to send my notes over so the clinical team could decide what course of action to take since she is refusing to go to the ED.  I called pt back after the line came back up and let her know that someone would be calling her back with further directions from [facility] because Dr. Brita Romp was not in today.    Pt was agreeable to this plan.   I emphasized that if her symptoms became worse to go on to the ED.    She was agreeable to this plan also and thanked me for calling her back.  I sent my notes high priority to [facility].

## 2022-07-31 NOTE — Progress Notes (Signed)
   SUBJECTIVE:   CHIEF COMPLAINT / HPI:   UPPER RESPIRATORY TRACT INFECTION - COVID positive 2/7 with symptom onset 2/5. Husband also with COVID. - initial symptoms with congestion, nausea. Cough and congestion getting worse. - had virtual visit 2/7, given zofran, molnupiravir but never able to get antiviral from pharmacy. 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 HIDA scan negative.  - R sided atypical chest pain with radiation down R arm and to mid back unchanged in character but feels is made worse by coughing and movement.   - h/o psoriatic arthritis, on daily prednisone. Planning to start new biologic soon.- - h/o emphysema, on breztri. Has not taken since yesterday, feels makes chest pain worse. Current smoker but unable to smoke since getting sick.  Worst symptom: coughing, chest pain Cough: yes, productive with change in sputum Shortness of breath: yes Chest pain: yes, constant throbbing Chest congestion: yes Nasal congestion: yes Runny nose: yes Post nasal drip: yes Sinus pressure: yes Face pain: yes Vomiting: no, nauseous Sick contacts: yes Context: worse Recurrent sinusitis: no Relief with OTC cold/cough medications: hasn't tried  Treatments attempted: opioids    OBJECTIVE:   BP (!) 188/79 (BP Location: Right Arm, Patient Position: Sitting, Cuff Size: Normal)   Pulse 67   Temp 98 F (36.7 C) (Temporal)   Resp 16   Wt 116 lb 9.6 oz (52.9 kg)   SpO2 91%   BMI 23.55 kg/m   Gen: tired appearing, in NAD Card: RRR Lungs: CTAB with very faint R sided rhonchorous breath sounds. Initial oxygenation saturation in 80s with ambulation.  MSK: TTP with reproducible pain to R sided thoracic chest/side Ext: WWP, no edema   ASSESSMENT/PLAN:   COVID-19, COPD With current COPD exacerbation 2/2 COVID flaring chronic  atypical chest pain as evidenced by reproducible MSK pain on exam, also with extensive cardiac and GI workup in the recent past with no change in character on exam today and without red flags to warrant emergent ED presentation. Slight rhonchorous breath sounds on exam with change in sputum and initial decreased oxygenation, improved with rest. Will provide antibiotic, steroid burst for exacerbation. Resume daily prednisone once completed steroid burst. Outside of COVID antiviral treatment window. Encouraged inhaler compliance and smoking cessation.  Strict return and emergency precautions discussed.    Myles Gip, DO

## 2022-07-31 NOTE — Telephone Encounter (Signed)
Reason for Disposition . SEVERE chest pain  Answer Assessment - Initial Assessment Questions 1. LOCATION: "Where does it hurt?"       I've had chest pain that started Monday hurting intermittently.   It's a really bad pain.   I don't think it's my heart.   I've been to ED.   They thought I had a heart attack.   The cardiology said I did not have a heart attack. I'm having pain in my right chest under my arm and in my upper back.    It's hurting so bad.    2. RADIATION: "Does the pain go anywhere else?" (e.g., into neck, jaw, arms, back)     The prior ED visit they thought I had a heart attack but I did not per the cardiologist I saw afterward.    I had a STEMI per my chart.   The heart dr. Michela Pitcher I did not have a heart attack.    This was in the Dec. 2023.   I don't need to go to the ED again.   They admitted me to the ICU and did a bunch of tests at that time.  3. ONSET: "When did the chest pain begin?" (Minutes, hours or days)      Started Monday. 4. PATTERN: "Does the pain come and go, or has it been constant since it started?"  "Does it get worse with exertion?"      Intermittently but it's a severe pain. She is refusing to go back to the ED.      The pain was so bad like this while I was in the hospital.    The pain medicine didn't even help me that I was given in the ICU.  5. DURATION: "How long does it last" (e.g., seconds, minutes, hours)     The pain is constant and does not ease up until I take 3 Percocets 10-325 mg and Gabapentin.   That will ease it up.    It takes 3 doses of medicine to get it to ease up.    6. SEVERITY: "How bad is the pain?"  (e.g., Scale 1-10; mild, moderate, or severe)    - MILD (1-3): doesn't interfere with normal activities     - MODERATE (4-7): interferes with normal activities or awakens from sleep    - SEVERE (8-10): excruciating pain, unable to do any normal activities       Severe.    I've had this pain before.    Last year Dr. Jacinto Reap. Prescribed Wellbutrin to  help me stop smoking.   That's when My chest started hurting.   I stopped the Wellbutrin but the pain kept getting worse and worse.  I do have sweating, dizziness and shortness of breath.   I think it's my lungs.    I had Covid 07/24/2022.  I was vomiting during that time.  Did not get an antiviral.   I was scheduled for my physical and had to cancel it due to having Covid.   I did a tele visit for the Covid.  I was not given an antiviral.   I would not have taken it anyway.     My husband had Covid I got it from him.     I'm having nasal congestion.   My throat is no longer sore.   My right sinus is also stopped up.    I think I have a sinus infection.    I'm coughing up mucus  dark mucus and blowing out of my nose is clear. 7. CARDIAC RISK FACTORS: "Do you have any history of heart problems or risk factors for heart disease?" (e.g., angina, prior heart attack; diabetes, high blood pressure, high cholesterol, smoker, or strong family history of heart disease)     See above    MI was ruled out by cardiologist but on my chart it said I had a STEMI. 8. PULMONARY RISK FACTORS: "Do you have any history of lung disease?"  (e.g., blood clots in lung, asthma, emphysema, birth control pills)     I'm getting over Covid.   I think this is my lungs but I don't know but it's not my heart. I have psoriatic  arthritis on the right side of my body and that's where the pain seems to settle is in the right side of my body.     The Covid has really done a number on me.    I think this is from the Covid. 9. CAUSE: "What do you think is causing the chest pain?"     My lungs from the Covid. 10. OTHER SYMPTOMS: "Do you have any other symptoms?" (e.g., dizziness, nausea, vomiting, sweating, fever, difficulty breathing, cough)       Dizziness, sweating, shortness of breath, chest pain.    I take a daily dose prednisone.    I think the dizziness I  is from all the pain medication I'm having to take for the chest pain. 11.  PREGNANCY: "Is there any chance you are pregnant?" "When was your last menstrual period?"       N/A due to age  Protocols used: Chest Pain-A-AH

## 2022-07-31 NOTE — Patient Instructions (Signed)
It was great to see you!  Our plans for today:  - Take the antibiotic and steroids as prescribed. Resume your daily 55m prednisone once you finish the higher dose.  - Take the cough medicine as needed. - Some other therapies you can try are: push fluids, rest, use vaporizer or mist prn, apply heat to sinuses prn, encouraged very strongly to quit smoking, and return office visit prn if symptoms persist or worsen.   Drinking warm liquids such as teas and soups can help with secretions and cough. A mist humidifier or vaporizer can work well to help with secretions and cough.  It is very important to clean the humidifier between use according to the instructions.    Of course, if you start having trouble breathing, worsening fevers, vomiting and unable to hold down any fluids, or you have other concerns, don't hesitate to come back or go to the ED after hours.   Take care and seek immediate care sooner if you develop any concerns.   Dr. RKy Barban

## 2022-08-01 DIAGNOSIS — M5481 Occipital neuralgia: Secondary | ICD-10-CM | POA: Diagnosis not present

## 2022-08-01 DIAGNOSIS — M5459 Other low back pain: Secondary | ICD-10-CM | POA: Diagnosis not present

## 2022-08-01 DIAGNOSIS — M8438XA Stress fracture, other site, initial encounter for fracture: Secondary | ICD-10-CM | POA: Diagnosis not present

## 2022-08-01 DIAGNOSIS — G894 Chronic pain syndrome: Secondary | ICD-10-CM | POA: Diagnosis not present

## 2022-08-01 DIAGNOSIS — N281 Cyst of kidney, acquired: Secondary | ICD-10-CM | POA: Diagnosis not present

## 2022-08-01 DIAGNOSIS — M25559 Pain in unspecified hip: Secondary | ICD-10-CM | POA: Diagnosis not present

## 2022-08-01 DIAGNOSIS — M810 Age-related osteoporosis without current pathological fracture: Secondary | ICD-10-CM | POA: Diagnosis not present

## 2022-08-01 DIAGNOSIS — Z79899 Other long term (current) drug therapy: Secondary | ICD-10-CM | POA: Diagnosis not present

## 2022-08-01 DIAGNOSIS — R110 Nausea: Secondary | ICD-10-CM | POA: Diagnosis not present

## 2022-08-01 DIAGNOSIS — Z79891 Long term (current) use of opiate analgesic: Secondary | ICD-10-CM | POA: Diagnosis not present

## 2022-08-01 DIAGNOSIS — M5416 Radiculopathy, lumbar region: Secondary | ICD-10-CM | POA: Diagnosis not present

## 2022-08-01 DIAGNOSIS — Z5181 Encounter for therapeutic drug level monitoring: Secondary | ICD-10-CM | POA: Diagnosis not present

## 2022-08-01 DIAGNOSIS — M5412 Radiculopathy, cervical region: Secondary | ICD-10-CM | POA: Diagnosis not present

## 2022-08-07 ENCOUNTER — Inpatient Hospital Stay (HOSPITAL_BASED_OUTPATIENT_CLINIC_OR_DEPARTMENT_OTHER): Payer: PPO | Admitting: Internal Medicine

## 2022-08-07 ENCOUNTER — Encounter: Payer: Self-pay | Admitting: Internal Medicine

## 2022-08-07 ENCOUNTER — Inpatient Hospital Stay: Payer: PPO

## 2022-08-07 NOTE — Progress Notes (Signed)
[facility] [place] OFFICE PROGRESS NOTE  Patient Care Team: Brita Romp Dionne Bucy, MD as PCP - General (Family Medicine) Kate Sable, MD as PCP - Cardiology (Cardiology) Cammie Sickle, MD as Consulting Physician (Internal Medicine) Lonia Farber, MD as Consulting Physician (Internal Medicine) Allyson Sabal, NP as Nurse Practitioner (Pain Medicine) [place] (Optometry) Quintin Alto, MD as Consulting Physician (Rheumatology) Vladimir Crofts, MD as Consulting Physician (Neurology) [name], MD (Urology) Thornton Park, MD as Referring Physician (Orthopedic Surgery) Pilar Jarvis, MD as Referring Physician (Pain Medicine) Angola, Jimmy J, MD as Referring Physician (Physical Medicine and Rehabilitation) Clyde Canterbury, MD as Referring Physician (Otolaryngology)   SUMMARY OF HEMATOLOGIC/ONCOLOGIC HISTORY:  # April 2015- 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 Manuella Ghazi Cymbalta  INTERVAL HISTORY: Alone.  Ambulating independently.  A very pleasant 72 year-old female patient with above history of  [name] following screening  without any end organ dysfunction is here for follow-up /phlebotomy.   Currently going thru GI work up for difficulty swallowing that may be causing chest pain.  S/p EGD/ cardiac cath.   Denies any worsening weight loss or abdominal swelling or leg swelling. Unfortunately she continues to smoke.  Review of Systems  Constitutional:  Positive for malaise/fatigue. Negative for chills, diaphoresis, fever and weight loss.  HENT:  Negative for nosebleeds and sore throat.   Eyes:  Negative for double vision.   Respiratory:  Negative for cough, hemoptysis, sputum production, shortness of breath and wheezing.   Cardiovascular:  Negative for chest pain, palpitations, orthopnea and leg swelling.  Gastrointestinal:  Negative for abdominal pain, blood in stool, constipation, diarrhea, heartburn, melena, nausea and vomiting.  Genitourinary:  Negative for dysuria, frequency and urgency.  Musculoskeletal:  Positive for back pain, joint pain, myalgias and neck pain.  Skin: Negative.  Negative for itching and rash.  Neurological:  Negative for dizziness, tingling, focal weakness, weakness and headaches.  Endo/Heme/Allergies:  Bruises/bleeds easily.  Psychiatric/Behavioral:  Negative for depression. The patient is not nervous/anxious and does not have insomnia.       PAST MEDICAL HISTORY :  Past Medical History:  Diagnosis Date   Allergic rhinitis    Allergy    Annular oral lichen planus    Anxiety    Arthritis    Depression    Deviated nasal septum    nasal obstruction   Guaiac positive stools    history   H. 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   History of palpitations    Hyperlipidemia    Hypertension    Migraine    Osteoarthritis    Osteoporosis    Panic disorder    Psoriasis    Psoriatic arthritis ([place])    Rheumatoid arteritis ([place])    Tinnitus     PAST SURGICAL HISTORY :   Past Surgical History:  Procedure Laterality Date   ABDOMINAL HYSTERECTOMY  1983   Ovaries have been removed.   ABDOMINOPLASTY     APPENDECTOMY     BREAST BIOPSY Right    neg- core   COLONOSCOPY  03/2006   COLONOSCOPY WITH PROPOFOL N/A 11/19/2017   Procedure: COLONOSCOPY WITH PROPOFOL;  Surgeon: Toledo, Benay Pike, MD;  Location: [facility] ENDOSCOPY;  Service: Gastroenterology;  Laterality: N/A;   CORONARY/GRAFT ACUTE MI REVASCULARIZATION N/A 02/24/2022   Procedure: Coronary/Graft Acute MI Revascularization;  Surgeon: Wellington Hampshire, MD;  Location: [facility];  Service: Cardiovascular;  Laterality: N/A;   ENDOSCOPIC CONCHA BULLOSA RESECTION Right 01/30/2021   Procedure: ENDOSCOPIC CONCHA BULLOSA RESECTION;  Surgeon: Clyde Canterbury, MD;  Location: [facility];  Service: ENT;  Laterality: Right;   ESOPHAGOGASTRODUODENOSCOPY N/A 03/19/2022   Procedure: ESOPHAGOGASTRODUODENOSCOPY (EGD);  Surgeon: Lesly Rubenstein, MD;  Location: [facility] ENDOSCOPY;  Service: Gastroenterology;  Laterality: N/A;   LEFT HEART CATH AND CORONARY ANGIOGRAPHY N/A 02/24/2022   Procedure: LEFT HEART CATH AND CORONARY ANGIOGRAPHY;  Surgeon: Wellington Hampshire, MD;  Location: [facility];  Service: Cardiovascular;  Laterality: N/A;   RHINOPLASTY  2002   SEPTOPLASTY N/A 01/30/2021   Procedure: SEPTOPLASTY;  Surgeon: Clyde Canterbury, MD;  Location: [facility];  Service: ENT;  Laterality: N/A;   UPPER GI ENDOSCOPY  2008    FAMILY HISTORY :   Family History  Problem Relation Age of Onset   Hypertension Mother    Hyperlipidemia Mother    Hypothyroidism Mother    Fibromyalgia Mother    Hearing loss Mother    Lung cancer Father    Coronary artery disease Father    Hyperlipidemia Father    Hypertension Father    Heart attack Father    Hearing loss Sister    Hyperlipidemia Sister    Hypertension Sister    Fibromyalgia Sister    Hemochromatosis Sister    ADD / ADHD Brother    Breast cancer Neg Hx     SOCIAL HISTORY:   Social History   Tobacco Use   Smoking status: Every Day    Packs/day: 1.00    Years: 49.00    Total pack years: 49.00    Types: Cigarettes   Smokeless tobacco: Never   Tobacco comments:    pt declines smoking cessation ( smoked since age 76)  Vaping Use   Vaping Use: Never used  Substance Use Topics   Alcohol use: Not Currently    Alcohol/week: 0.0 standard drinks of alcohol   Drug use: No    ALLERGIES:  is allergic to enbrel [etanercept], modafinil, augmentin  [amoxicillin-pot clavulanate], cefdinir, and  lisinopril.  MEDICATIONS:  Current Outpatient Medications  Medication Sig Dispense Refill   amLODipine (NORVASC) 5 MG tablet Take 1 tablet (5 mg total) by mouth daily. 90 tablet 1   aspirin 81 MG chewable tablet Chew 1 tablet (81 mg total) by mouth daily. 30 tablet 0   azelastine (ASTELIN) 0.1 % nasal spray Place 1 spray into both nostrils daily as needed.     Budeson-Glycopyrrol-Formoterol (BREZTRI AEROSPHERE) 160-9-4.8 MCG/ACT AERO Inhale 2 puffs into the lungs 2 (two) times daily. 10.7 g 11   clobetasol ointment (TEMOVATE) 0.05 % APP TOPICALLY TO GLUTEAL AREA ONCE D PRF DERMATITIS 30 g 1   COLLAGEN PO Take 30 mLs by mouth.     diclofenac Sodium (VOLTAREN) 1 % GEL Apply 2 g topically 4 (four) times daily.     DULoxetine (CYMBALTA) 20 MG capsule Cymbalta 20 mg capsule,delayed release  Take 1 capsule twice a day by oral route.     escitalopram (LEXAPRO) 20 MG tablet TAKE ONE TABLET BY MOUTH EVERY DAY 90 tablet 1   ezetimibe (ZETIA) 10 MG tablet Take 1 tablet (10 mg total) by mouth daily. 30 tablet 5   fluticasone (FLONASE) 50 MCG/ACT nasal spray Place 2 sprays  into both nostrils daily.     gabapentin (NEURONTIN) 100 MG capsule Take 100 mg by mouth 2 (two) times daily as needed.     gabapentin (NEURONTIN) 300 MG capsule Take 300 mg by mouth as needed (qhs). PRN     loratadine (CLARITIN) 10 MG tablet Take 10 mg by mouth daily.     losartan (COZAAR) 25 MG tablet Take 1 tablet (25 mg total) by mouth daily. 90 tablet 1   methocarbamol (ROBAXIN) 750 MG tablet methocarbamol 750 mg tablet  Take 1 tablet 3 times a day by oral route for 30 days.     nitroGLYCERIN (NITROSTAT) 0.4 MG SL tablet Place 1 tablet (0.4 mg total) under the tongue every 5 (five) minutes as needed for chest pain. 30 tablet 12   nystatin (MYCOSTATIN) 100000 UNIT/ML suspension Use as directed 5 mLs (500,000 Units total) in the mouth or throat 4 (four) times daily. 473 mL 0   ondansetron (ZOFRAN-ODT) 4 MG disintegrating tablet Take  1 tablet (4 mg total) by mouth every 8 (eight) hours as needed for nausea or vomiting. 20 tablet 0   oxyCODONE-acetaminophen (PERCOCET) 10-325 MG tablet Take 1 tablet by mouth every 4 (four) hours as needed for pain.     pantoprazole (PROTONIX) 40 MG tablet Take 1 tablet (40 mg total) by mouth 2 (two) times daily before a meal. 60 tablet 1   potassium chloride (KLOR-CON M) 10 MEQ tablet TAKE TWO TABLETS BY MOUTH EVERY DAY 30 tablet 0   predniSONE (DELTASONE) 10 MG tablet Take 1 tablet by mouth daily.     rosuvastatin (CRESTOR) 5 MG tablet TAKE 1 TABLET BY MOUTH DAILY 90 tablet 1   traZODone (DESYREL) 100 MG tablet Take 0.5-1 tablets (50-100 mg total) by mouth at bedtime. 30 tablet 5   triamcinolone cream (KENALOG) 0.1 % Apply topically 2 (two) times daily as needed.     benzonatate (TESSALON) 200 MG capsule Take 1 capsule (200 mg total) by mouth 2 (two) times daily as needed for cough. (Patient not taking: Reported on 08/07/2022) 20 capsule 0   gabapentin (NEURONTIN) 100 MG capsule Take 1 capsule by mouth every morning. (Patient not taking: Reported on 08/07/2022)     gentamicin ointment (GARAMYCIN) 0.1 % Apply 1 application. topically 3 (three) times daily. (Patient not taking: Reported on 08/07/2022)     hydrocortisone 2.5 % ointment Apply topically 2 (two) times daily. (Patient not taking: Reported on 08/07/2022)     No current facility-administered medications for this visit.    PHYSICAL EXAMINATION: ECOG PERFORMANCE STATUS: 0 - Asymptomatic  BP 135/82 (BP Location: Left Arm, Patient Position: Sitting)   Pulse 90   Temp 97.7 F (36.5 C) (Tympanic)   Resp 18   Wt 117 lb (53.1 kg)   BMI 23.63 kg/m   Filed Weights   08/07/22 1300  Weight: 117 lb (53.1 kg)    Physical Exam Constitutional:      Comments: Ambulating independently.  Alone.  HENT:     Head: Normocephalic and atraumatic.     Mouth/Throat:     Pharynx: No oropharyngeal exudate.  Eyes:     Pupils: Pupils are equal,  round, and reactive to light.  Cardiovascular:     Rate and Rhythm: Normal rate and regular rhythm.  Pulmonary:     Effort: Pulmonary effort is normal. No respiratory distress.     Breath sounds: Normal breath sounds. No wheezing.  Abdominal:     General: Bowel sounds are normal. There  is no distension.     Palpations: Abdomen is soft. There is no mass.     Tenderness: There is no abdominal tenderness. There is no guarding or rebound.  Musculoskeletal:        General: No tenderness. Normal range of motion.     Cervical back: Normal range of motion and neck supple.  Skin:    General: Skin is warm.  Neurological:     Mental Status: She is alert and oriented to person, place, and time.  Psychiatric:        Mood and Affect: Affect normal.      LABORATORY DATA:  I have reviewed the data as listed    Component Value Date/Time   NA 140 02/24/2022 0617   NA 146 (H) 08/09/2021 1402   K 3.5 02/24/2022 0617   CL 103 02/24/2022 0617   CO2 30 02/24/2022 0617   GLUCOSE 131 (H) 02/24/2022 0617   BUN 13 02/24/2022 0617   BUN 19 08/09/2021 1402   CREATININE 0.68 02/24/2022 0617   CREATININE 0.57 05/05/2017 1653   CALCIUM 8.8 (L) 02/24/2022 0617   PROT 6.5 02/24/2022 0617   PROT 6.7 08/09/2021 1402   ALBUMIN 3.9 02/24/2022 0617   ALBUMIN 4.6 08/09/2021 1402   AST 19 02/24/2022 0617   ALT 15 02/24/2022 0617   ALKPHOS 48 02/24/2022 0617   BILITOT 0.4 02/24/2022 0617   BILITOT 0.3 08/09/2021 1402   GFRNONAA >60 02/24/2022 0617   GFRNONAA 97 05/05/2017 1653   GFRAA 83 01/20/2020 1327   GFRAA 113 05/05/2017 1653    No results found for: "SPEP", "UPEP"  Lab Results  Component Value Date   WBC 11.0 (H) 07/31/2022   NEUTROABS 6.7 07/31/2022   HGB 15.3 (H) 07/31/2022   HCT 47.5 (H) 07/31/2022   MCV 99.4 07/31/2022   PLT 357 07/31/2022      Chemistry      Component Value Date/Time   NA 140 02/24/2022 0617   NA 146 (H) 08/09/2021 1402   K 3.5 02/24/2022 0617   CL 103  02/24/2022 0617   CO2 30 02/24/2022 0617   BUN 13 02/24/2022 0617   BUN 19 08/09/2021 1402   CREATININE 0.68 02/24/2022 0617   CREATININE 0.57 05/05/2017 1653   GLU 102 09/05/2014 0000      Component Value Date/Time   CALCIUM 8.8 (L) 02/24/2022 0617   ALKPHOS 48 02/24/2022 0617   AST 19 02/24/2022 0617   ALT 15 02/24/2022 0617   BILITOT 0.4 02/24/2022 0617   BILITOT 0.3 08/09/2021 1402       ASSESSMENT & PLAN:   [name] ([place]) # HEREDITARY  Hemochromatosis/ compound heterozygous C282Y & H63D- [> ferritin:150; Iron sa-50%]-  # FEB 2024-  iron saturations 31%; ferritin 171 [recent COVID feb, 2024].  Patient continues to be asymptomatic; no evidence of organ dysfunction/overload.  Stable.  HOLD phlebotomy today.  # Psoriasis arthritis-on? Enbrel-cost issue [Dr.Patel;[facility]; Rheum- Stable.    #Mild erythrocytosis hematocrit 47-likely secondary to smoking; recommend smoking cessation. Stable.   # Active smoker- [facility]-10/22/2020 CT scan reviewed negative for any malignancy.  Again counseled to quit smoking-cutting down.   DISPOSITION:  # HOLD Phlebotomy today # 6 months with labs [facility], iron studies, ferritin; 1 week prior, MD/ possible phlebotomy- Dr.B      Cammie Sickle, MD 08/07/2022 2:17 PM

## 2022-08-07 NOTE — Assessment & Plan Note (Signed)
#   HEREDITARY  Hemochromatosis/ compound heterozygous C282Y & H63D- [> ferritin:150; Iron sa-50%]-  # FEB 2024-  iron saturations 31%; ferritin 171 [recent COVID feb, 2024].  Patient continues to be asymptomatic; no evidence of organ dysfunction/overload.  Stable.  HOLD phlebotomy today.  # Psoriasis arthritis-on? Enbrel-cost issue [Dr.Patel;[facility]; Rheum- Stable.    #Mild erythrocytosis hematocrit 47-likely secondary to smoking; recommend smoking cessation. Stable.   # Active smoker- [facility]-10/22/2020 CT scan reviewed negative for any malignancy.  Again counseled to quit smoking-cutting down.   DISPOSITION:  # HOLD Phlebotomy today # 6 months with labs [facility], iron studies, ferritin; 1 week prior, MD/ possible phlebotomy- Dr.B

## 2022-08-07 NOTE — Progress Notes (Signed)
Currently going thru GI work up for difficulty swallowing that may be causing chest pain.

## 2022-08-13 DIAGNOSIS — J018 Other acute sinusitis: Secondary | ICD-10-CM | POA: Diagnosis not present

## 2022-08-15 ENCOUNTER — Encounter: Payer: Self-pay | Admitting: Family Medicine

## 2022-08-15 ENCOUNTER — Ambulatory Visit (INDEPENDENT_AMBULATORY_CARE_PROVIDER_SITE_OTHER): Payer: PPO | Admitting: Family Medicine

## 2022-08-15 VITALS — BP 139/80 | HR 100 | Temp 98.6°F | Resp 16 | Ht 59.0 in | Wt 116.0 lb

## 2022-08-15 DIAGNOSIS — Z0000 Encounter for general adult medical examination without abnormal findings: Secondary | ICD-10-CM | POA: Diagnosis not present

## 2022-08-15 DIAGNOSIS — I700 Atherosclerosis of aorta: Secondary | ICD-10-CM

## 2022-08-15 DIAGNOSIS — D692 Other nonthrombocytopenic purpura: Secondary | ICD-10-CM | POA: Diagnosis not present

## 2022-08-15 DIAGNOSIS — I10 Essential (primary) hypertension: Secondary | ICD-10-CM

## 2022-08-15 DIAGNOSIS — F3342 Major depressive disorder, recurrent, in full remission: Secondary | ICD-10-CM | POA: Diagnosis not present

## 2022-08-15 DIAGNOSIS — Z23 Encounter for immunization: Secondary | ICD-10-CM

## 2022-08-15 NOTE — Assessment & Plan Note (Signed)
Stable--continue to monitor

## 2022-08-15 NOTE — Progress Notes (Signed)
I,Joseline E Rosas,acting as a scribe for Lavon Paganini, MD.,have documented all relevant documentation on the behalf of Lavon Paganini, MD,as directed by  Lavon Paganini, MD while in the presence of Lavon Paganini, MD.     Complete physical exam   Patient: April Best   DOB: [birth year]   71 y.o. Female  MRN: [id number] Visit Date: 08/15/2022  Today's healthcare provider: Lavon Paganini, MD   Chief Complaint  Patient presents with   Annual Exam   Subjective    April Best is a 72 y.o. female who presents today for a complete physical exam.  She reports consuming a general diet. The patient does not participate in regular exercise at present. She generally feels well. She reports sleeping well. She does not have additional problems to discuss today.  HPI  09/24/21 AWV  On a second round of abx per ENT - Cefdinir  Past Medical History:  Diagnosis Date   Allergic rhinitis    Allergy    Annular oral lichen planus    Anxiety    Arthritis    Depression    Deviated nasal septum    nasal obstruction   Guaiac positive stools    history   H. 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   History of palpitations    Hyperlipidemia    Hypertension    Migraine    Osteoarthritis    Osteoporosis    Panic disorder    Psoriasis    Psoriatic arthritis ([place])    Rheumatoid arteritis ([place])    Tinnitus    Past Surgical History:  Procedure Laterality Date   ABDOMINAL HYSTERECTOMY  1983   Ovaries have been removed.   ABDOMINOPLASTY     APPENDECTOMY     BREAST BIOPSY Right    neg- core   COLONOSCOPY  03/2006   COLONOSCOPY WITH PROPOFOL N/A 11/19/2017   Procedure: COLONOSCOPY WITH PROPOFOL;  Surgeon: Toledo, Benay Pike, MD;  Location: [facility];  Service: Gastroenterology;  Laterality: N/A;   CORONARY/GRAFT ACUTE MI REVASCULARIZATION N/A 02/24/2022   Procedure: Coronary/Graft Acute MI Revascularization;  Surgeon:  Wellington Hampshire, MD;  Location: [place] [facility];  Service: Cardiovascular;  Laterality: N/A;   ENDOSCOPIC CONCHA BULLOSA RESECTION Right 01/30/2021   Procedure: ENDOSCOPIC CONCHA BULLOSA RESECTION;  Surgeon: Clyde Canterbury, MD;  Location: [place];  Service: ENT;  Laterality: Right;   ESOPHAGOGASTRODUODENOSCOPY N/A 03/19/2022   Procedure: ESOPHAGOGASTRODUODENOSCOPY (EGD);  Surgeon: Lesly Rubenstein, MD;  Location: [facility];  Service: Gastroenterology;  Laterality: N/A;   LEFT HEART CATH AND CORONARY ANGIOGRAPHY N/A 02/24/2022   Procedure: LEFT HEART CATH AND CORONARY ANGIOGRAPHY;  Surgeon: Wellington Hampshire, MD;  Location: [place] [facility];  Service: Cardiovascular;  Laterality: N/A;   RHINOPLASTY  2002   SEPTOPLASTY N/A 01/30/2021   Procedure: SEPTOPLASTY;  Surgeon: Clyde Canterbury, MD;  Location: [place];  Service: ENT;  Laterality: N/A;   UPPER GI ENDOSCOPY  2008   Social History   Socioeconomic History   Marital status: Married    Spouse name: Not on file   Number of children: 1   Years of education: Not on file   Highest education level: High school graduate  Occupational History   Occupation: retired  Tobacco Use   Smoking status: Every Day    Packs/day: 1.00    Years: 49.00    Total pack years: 49.00    Types: Cigarettes  Smokeless tobacco: Never   Tobacco comments:    pt declines smoking cessation ( smoked since age 71)  Vaping Use   Vaping Use: Never used  Substance and Sexual Activity   Alcohol use: Not Currently    Alcohol/week: 0.0 standard drinks of alcohol   Drug use: No   Sexual activity: Yes    Birth control/protection: Surgical  Other Topics Concern   Not on file  Social History Narrative   Not on file   Social Determinants of Health   Financial Resource Strain: Low Risk  (09/24/2021)   Overall Financial Resource Strain (CARDIA)    Difficulty of Paying Living Expenses: Not hard at all  Food Insecurity: No Food  Insecurity (02/24/2022)   Hunger Vital Sign    Worried About Running Out of Food in the Last Year: Never true    [place] in the Last Year: Never true  Transportation Needs: Unknown (02/24/2022)   PRAPARE - Transportation    Lack of Transportation (Medical): Patient refused    Lack of Transportation (Non-Medical): Patient refused  Physical Activity: Inactive (09/18/2020)   Exercise Vital Sign    Days of Exercise per Week: 0 days    Minutes of Exercise per Session: 0 min  Stress: Stress Concern Present (09/24/2021)   [place]    Feeling of Stress : To some extent  Social Connections: Moderately Integrated (09/24/2021)   Social Connection and Isolation Panel [NHANES]    Frequency of Communication with Friends and Family: More than three times a week    Frequency of Social Gatherings with Friends and Family: Three times a week    Attends Religious Services: Never    Active Member of Clubs or Organizations: Yes    Attends Archivist Meetings: More than 4 times per year    Marital Status: Married  Human resources officer Violence: Unknown (02/24/2022)   Humiliation, Afraid, Rape, and Kick questionnaire    Fear of Current or Ex-Partner: Patient refused    Emotionally Abused: Patient refused    Physically Abused: Patient refused    Sexually Abused: Patient refused   Family Status  Relation Name Status   Mother  Deceased   Father  Deceased   Sister  Alive   Sister  (Not Specified)   Brother  Alive   Neg Hx  (Not Specified)   Family History  Problem Relation Age of Onset   Hypertension Mother    Hyperlipidemia Mother    Hypothyroidism Mother    Fibromyalgia Mother    Hearing loss Mother    Lung cancer Father    Coronary artery disease Father    Hyperlipidemia Father    Hypertension Father    Heart attack Father    Hearing loss Sister    Hyperlipidemia Sister    Hypertension Sister    Fibromyalgia  Sister    Hemochromatosis Sister    ADD / ADHD Brother    Breast cancer Neg Hx    Allergies  Allergen Reactions   Enbrel [Etanercept] Hives    Other reaction(s): Muscle Cramps   Modafinil Rash   Augmentin  [Amoxicillin-Pot Clavulanate] Nausea And Vomiting    as stated (XR).   Cefdinir Diarrhea and Other (See Comments)    Other reaction(s): GI intolerance, Other (see comments) Gives her Cdiff Gives her Cdiff    Lisinopril Cough    Patient Care Team: Virginia Crews, MD as PCP - General (Family Medicine) Kate Sable,  MD as PCP - Cardiology (Cardiology) Cammie Sickle, MD as Consulting Physician (Internal Medicine) Lonia Farber, MD as Consulting Physician (Internal Medicine) Allyson Sabal, NP as Nurse Practitioner (Pain Medicine) Pa, [place] (Optometry) Quintin Alto, MD as Consulting Physician (Rheumatology) Vladimir Crofts, MD as Consulting Physician (Neurology) [name], MD (Urology) Thornton Park, MD as Referring Physician (Orthopedic Surgery) Pilar Jarvis, MD as Referring Physician (Pain Medicine) [place], Jimmy J, MD as Referring Physician (Physical Medicine and Rehabilitation) Clyde Canterbury, MD as Referring Physician (Otolaryngology)   Medications: Outpatient Medications Prior to Visit  Medication Sig   amLODipine (NORVASC) 5 MG tablet Take 1 tablet (5 mg total) by mouth daily.   aspirin 81 MG chewable tablet Chew 1 tablet (81 mg total) by mouth daily.   azelastine (ASTELIN) 0.1 % nasal spray Place 1 spray into both nostrils daily as needed.   Budeson-Glycopyrrol-Formoterol (BREZTRI AEROSPHERE) 160-9-4.8 MCG/ACT AERO Inhale 2 puffs into the lungs 2 (two) times daily.   clobetasol ointment (TEMOVATE) 0.05 % APP TOPICALLY TO GLUTEAL AREA ONCE D PRF DERMATITIS   COLLAGEN PO Take 30 mLs by mouth.   diclofenac Sodium (VOLTAREN) 1 % GEL Apply 2 g topically 4 (four) times daily.   DULoxetine (CYMBALTA) 20 MG capsule  Cymbalta 20 mg capsule,delayed release  Take 1 capsule twice a day by oral route.   escitalopram (LEXAPRO) 20 MG tablet TAKE ONE TABLET BY MOUTH EVERY DAY   ezetimibe (ZETIA) 10 MG tablet Take 1 tablet (10 mg total) by mouth daily.   fluticasone (FLONASE) 50 MCG/ACT nasal spray Place 2 sprays into both nostrils daily.   gabapentin (NEURONTIN) 100 MG capsule Take 100 mg by mouth 2 (two) times daily as needed.   gabapentin (NEURONTIN) 100 MG capsule Take 1 capsule by mouth every morning.   gabapentin (NEURONTIN) 300 MG capsule Take 300 mg by mouth as needed (qhs). PRN   gentamicin ointment (GARAMYCIN) 0.1 % Apply 1 application  topically 3 (three) times daily.   loratadine (CLARITIN) 10 MG tablet Take 10 mg by mouth daily.   losartan (COZAAR) 25 MG tablet Take 1 tablet (25 mg total) by mouth daily.   methocarbamol (ROBAXIN) 750 MG tablet methocarbamol 750 mg tablet  Take 1 tablet 3 times a day by oral route for 30 days.   nitroGLYCERIN (NITROSTAT) 0.4 MG SL tablet Place 1 tablet (0.4 mg total) under the tongue every 5 (five) minutes as needed for chest pain.   nystatin (MYCOSTATIN) 100000 UNIT/ML suspension Use as directed 5 mLs (500,000 Units total) in the mouth or throat 4 (four) times daily.   ondansetron (ZOFRAN-ODT) 4 MG disintegrating tablet Take 1 tablet (4 mg total) by mouth every 8 (eight) hours as needed for nausea or vomiting.   oxyCODONE-acetaminophen (PERCOCET) 10-325 MG tablet Take 1 tablet by mouth every 4 (four) hours as needed for pain.   pantoprazole (PROTONIX) 40 MG tablet Take 1 tablet (40 mg total) by mouth 2 (two) times daily before a meal.   potassium chloride (KLOR-CON M) 10 MEQ tablet TAKE TWO TABLETS BY MOUTH EVERY DAY   predniSONE (DELTASONE) 10 MG tablet Take 1 tablet by mouth daily.   traZODone (DESYREL) 100 MG tablet Take 0.5-1 tablets (50-100 mg total) by mouth at bedtime.   triamcinolone cream (KENALOG) 0.1 % Apply topically 2 (two) times daily as needed.    benzonatate (TESSALON) 200 MG capsule Take 1 capsule (200 mg total) by mouth 2 (two) times daily as needed for  cough. (Patient not taking: Reported on 08/15/2022)   hydrocortisone 2.5 % ointment Apply topically 2 (two) times daily. (Patient not taking: Reported on 08/15/2022)   rosuvastatin (CRESTOR) 5 MG tablet TAKE 1 TABLET BY MOUTH DAILY (Patient not taking: Reported on 08/15/2022)   No facility-administered medications prior to visit.    Review of Systems  Constitutional:  Positive for diaphoresis and fatigue.  HENT:  Positive for sinus pressure.   Gastrointestinal:  Positive for diarrhea.  Musculoskeletal:  Positive for arthralgias, back pain, myalgias and neck pain.  Neurological:  Positive for numbness.  Hematological:  Bruises/bleeds easily.      Objective    BP 139/80 (BP Location: Left Arm, Patient Position: Sitting, Cuff Size: Normal)   Pulse 100   Temp 98.6 F (37 C) (Temporal)   Resp 16   Ht '4\' 11"'$  (1.499 m)   Wt 116 lb (52.6 kg)   BMI 23.43 kg/m     Physical Exam Vitals reviewed.  Constitutional:      General: She is not in acute distress.    Appearance: Normal appearance. She is well-developed. She is not diaphoretic.  HENT:     Head: Normocephalic and atraumatic.     Right Ear: Tympanic membrane, ear canal and external ear normal.     Left Ear: Tympanic membrane, ear canal and external ear normal.     Nose: Nose normal.     Mouth/Throat:     Mouth: Mucous membranes are moist.     Pharynx: Oropharynx is clear. No oropharyngeal exudate.  Eyes:     General: No scleral icterus.    Conjunctiva/sclera: Conjunctivae normal.     Pupils: Pupils are equal, round, and reactive to light.  Neck:     Thyroid: No thyromegaly.  Cardiovascular:     Rate and Rhythm: Normal rate and regular rhythm.     Heart sounds: Normal heart sounds. No murmur heard. Pulmonary:     Effort: Pulmonary effort is normal. No respiratory distress.     Breath sounds: Normal breath sounds.  No wheezing or rales.  Abdominal:     General: There is no distension.     Palpations: Abdomen is soft.     Tenderness: There is no abdominal tenderness.  Musculoskeletal:        General: No deformity.     Cervical back: Neck supple.     Right lower leg: No edema.     Left lower leg: No edema.  Lymphadenopathy:     Cervical: No cervical adenopathy.  Skin:    General: Skin is warm and dry.     Findings: No rash.  Neurological:     Mental Status: She is alert and oriented to person, place, and time. Mental status is at baseline.  Psychiatric:        Mood and Affect: Mood normal.        Behavior: Behavior normal.        Thought Content: Thought content normal.       Last depression screening scores    08/15/2022    1:49 PM 03/06/2022    2:20 PM 08/09/2021    1:16 PM  PHQ 2/9 Scores  PHQ - 2 Score 0 2 0  PHQ- 9 Score 0 4 4   Last fall risk screening    08/15/2022    1:49 PM  [place] in the past year? 0  Number falls in past yr: 0  Injury with Fall? 0  Risk for  fall due to : No Fall Risks   Last Audit-C alcohol use screening    03/06/2022    2:20 PM  Alcohol Use Disorder Test (AUDIT)  1. How often do you have a drink containing alcohol? 0  2. How many drinks containing alcohol do you have on a typical day when you are drinking? 0  3. How often do you have six or more drinks on one occasion? 0  AUDIT-C Score 0   A score of 3 or more in women, and 4 or more in men indicates increased risk for alcohol abuse, EXCEPT if all of the points are from question 1   No results found for any visits on 08/15/22.  Assessment & Plan    Routine Health Maintenance and Physical Exam  Exercise Activities and Dietary recommendations  Goals      DIET - EAT MORE FRUITS AND VEGETABLES     Exercise 150 minutes per week (moderate activity)     Recommend to exercise for 3 days a week for at least 30 minutes at a time.      Quit smoking / using tobacco         Immunization History  Administered Date(s) Administered   Fluad Quad(high Dose 65+) 04/23/2021, 08/15/2022   Influenza Split 02/21/2010, 04/22/2012, 07/15/2016   Influenza, High Dose Seasonal PF 03/05/2017, 07/08/2018   Influenza,inj,Quad PF,6+ Mos 03/23/2015   Moderna Sars-Covid-2 Vaccination 07/23/2019, 08/20/2019   Pneumococcal Conjugate-13 01/15/2017   Pneumococcal Polysaccharide-23 04/22/2012, 07/08/2018   Tdap 02/21/2010   Zoster, Live 11/04/2013    Health Maintenance  Topic Date Due   Zoster Vaccines- Shingrix (1 of 2) Never done   COVID-19 Vaccine (3 - Moderna risk series) 09/17/2019   DTaP/Tdap/Td (2 - Td or Tdap) 02/22/2020   DEXA SCAN  09/05/2021   Medicare Annual Wellness (AWV)  09/25/2022   Lung Cancer Screening  02/25/2023   MAMMOGRAM  03/06/2024   COLONOSCOPY (Pts 45-19yr Insurance coverage will need to be confirmed)  11/20/2027   Pneumonia Vaccine 72 Years old  Completed   INFLUENZA VACCINE  Completed   Hepatitis C Screening  Completed   HPV VACCINES  Aged Out    Discussed health benefits of physical activity, and encouraged her to engage in regular exercise appropriate for her age and condition.  Problem List Items Addressed This Visit       Cardiovascular and Mediastinum   Essential hypertension    Well controlled Continue current medications Reiewed recent metabolic panel F/u in 6 months       Aortic atherosclerosis ([place]   Senile purpura ([place]    Stable - continue to monitor        Other   Recurrent major depressive disorder, in full remission ([place]    Chronic and stable Continue lexapro at current dose      Other Visit Diagnoses     Encounter for annual physical exam    -  Primary   Flu vaccine need       Relevant Orders   Flu Vaccine QUAD High Dose(Fluad) (Completed)        Return in about 1 year (around 08/15/2023) for CPE.     I, ALavon Paganini MD, have reviewed all documentation for this visit. The documentation  on 08/15/22 for the exam, diagnosis, procedures, and orders are all accurate and complete.   [place]ia Bujak, ADionne Bucy MD, MPH [email or web address]

## 2022-08-15 NOTE — Assessment & Plan Note (Addendum)
Well controlled Continue current medications Reiewed recent metabolic panel F/u in 6 months

## 2022-08-15 NOTE — Assessment & Plan Note (Signed)
Chronic and stable Continue lexapro at current dose

## 2022-09-05 DIAGNOSIS — R768 Other specified abnormal immunological findings in serum: Secondary | ICD-10-CM | POA: Diagnosis not present

## 2022-09-05 DIAGNOSIS — L409 Psoriasis, unspecified: Secondary | ICD-10-CM | POA: Diagnosis not present

## 2022-09-05 DIAGNOSIS — L4050 Arthropathic psoriasis, unspecified: Secondary | ICD-10-CM | POA: Diagnosis not present

## 2022-09-05 DIAGNOSIS — R760 Raised antibody titer: Secondary | ICD-10-CM | POA: Diagnosis not present

## 2022-09-05 DIAGNOSIS — M539 Dorsopathy, unspecified: Secondary | ICD-10-CM | POA: Diagnosis not present

## 2022-09-05 DIAGNOSIS — M797 Fibromyalgia: Secondary | ICD-10-CM | POA: Diagnosis not present

## 2022-09-09 DIAGNOSIS — J310 Chronic rhinitis: Secondary | ICD-10-CM | POA: Diagnosis not present

## 2022-09-09 DIAGNOSIS — J3489 Other specified disorders of nose and nasal sinuses: Secondary | ICD-10-CM | POA: Diagnosis not present

## 2022-09-18 DIAGNOSIS — L4050 Arthropathic psoriasis, unspecified: Secondary | ICD-10-CM | POA: Diagnosis not present

## 2022-09-26 ENCOUNTER — Other Ambulatory Visit: Payer: Self-pay | Admitting: Family Medicine

## 2022-09-26 DIAGNOSIS — M5481 Occipital neuralgia: Secondary | ICD-10-CM | POA: Diagnosis not present

## 2022-09-26 DIAGNOSIS — M5412 Radiculopathy, cervical region: Secondary | ICD-10-CM | POA: Diagnosis not present

## 2022-09-26 DIAGNOSIS — G894 Chronic pain syndrome: Secondary | ICD-10-CM | POA: Diagnosis not present

## 2022-09-26 DIAGNOSIS — M25559 Pain in unspecified hip: Secondary | ICD-10-CM | POA: Diagnosis not present

## 2022-09-26 DIAGNOSIS — M5459 Other low back pain: Secondary | ICD-10-CM | POA: Diagnosis not present

## 2022-09-26 DIAGNOSIS — M810 Age-related osteoporosis without current pathological fracture: Secondary | ICD-10-CM | POA: Diagnosis not present

## 2022-09-26 DIAGNOSIS — R110 Nausea: Secondary | ICD-10-CM | POA: Diagnosis not present

## 2022-09-26 DIAGNOSIS — M5416 Radiculopathy, lumbar region: Secondary | ICD-10-CM | POA: Diagnosis not present

## 2022-09-26 DIAGNOSIS — I252 Old myocardial infarction: Secondary | ICD-10-CM

## 2022-09-26 DIAGNOSIS — M8438XA Stress fracture, other site, initial encounter for fracture: Secondary | ICD-10-CM | POA: Diagnosis not present

## 2022-09-26 DIAGNOSIS — N281 Cyst of kidney, acquired: Secondary | ICD-10-CM | POA: Diagnosis not present

## 2022-09-26 DIAGNOSIS — Z79899 Other long term (current) drug therapy: Secondary | ICD-10-CM | POA: Diagnosis not present

## 2022-09-26 NOTE — Telephone Encounter (Signed)
Requested Prescriptions  Pending Prescriptions Disp Refills   amLODipine (NORVASC) 5 MG tablet [Pharmacy Med Name: AMLODIPINE BESYLATE 5 MG TAB] 90 tablet 1    Sig: TAKE 1 TABLET BY MOUTH DAILY     Cardiovascular: Calcium Channel Blockers 2 Passed - 09/26/2022  1:49 PM      Passed - Last BP in normal range    BP Readings from Last 1 Encounters:  08/15/22 139/80         Passed - Last Heart Rate in normal range    Pulse Readings from Last 1 Encounters:  08/15/22 100         Passed - Valid encounter within last 6 months    Recent Outpatient Visits           1 month ago Encounter for annual physical exam   [facility], Marzella Schlein, MD   1 month ago COVID-19   [facility] Caro Laroche, DO   6 months ago Hospital discharge follow-up   [facility] Jacky Kindle, FNP   11 months ago Lichen planus of tongue   [facility] Jacky Kindle, FNP   1 year ago Hypercholesteremia   [facility] [place], Marzella Schlein, MD       Future Appointments             In 4 weeks Ag[place], Arlys John, MD [facility] [place]   In 10 months [place], Marzella Schlein, MD [facility]

## 2022-09-30 ENCOUNTER — Other Ambulatory Visit: Payer: Self-pay

## 2022-09-30 MED ORDER — EZETIMIBE 10 MG PO TABS: 10.0000 mg | ORAL_TABLET | Freq: Every day | ORAL | 0 refills | Status: DC

## 2022-10-03 DIAGNOSIS — M5416 Radiculopathy, lumbar region: Secondary | ICD-10-CM | POA: Diagnosis not present

## 2022-10-07 ENCOUNTER — Ambulatory Visit (INDEPENDENT_AMBULATORY_CARE_PROVIDER_SITE_OTHER): Payer: PPO

## 2022-10-07 ENCOUNTER — Other Ambulatory Visit: Payer: Self-pay | Admitting: Family Medicine

## 2022-10-07 VITALS — Ht 59.0 in | Wt 116.0 lb

## 2022-10-07 DIAGNOSIS — Z0000 Encounter for general adult medical examination without abnormal findings: Secondary | ICD-10-CM

## 2022-10-07 DIAGNOSIS — B370 Candidal stomatitis: Secondary | ICD-10-CM

## 2022-10-07 MED ORDER — NYSTATIN 100000 UNIT/ML MT SUSP: 5.0000 mL | Freq: Four times a day (QID) | OROMUCOSAL | 0 refills | Status: DC

## 2022-10-07 NOTE — Patient Instructions (Signed)
April Best , Thank you for taking time to come for your Medicare Wellness Visit. I appreciate your ongoing commitment to your health goals. Please review the following plan we discussed and let me know if I can assist you in the future.   These are the goals we discussed:  Goals      DIET - EAT MORE FRUITS AND VEGETABLES     Exercise 150 minutes per week (moderate activity)     Recommend to exercise for 3 days a week for at least 30 minutes at a time.      Quit smoking / using tobacco        This is a list of the screening recommended for you and due dates:  Health Maintenance  Topic Date Due   Zoster (Shingles) Vaccine (1 of 2) Never done   COVID-19 Vaccine (3 - Moderna risk series) 09/17/2019   DTaP/Tdap/Td vaccine (2 - Td or Tdap) 02/22/2020   DEXA scan (bone density measurement)  09/05/2021   Flu Shot  01/16/2023   Screening for Lung Cancer  02/25/2023   Medicare Annual Wellness Visit  10/07/2023   Mammogram  03/06/2024   Colon Cancer Screening  11/20/2027   Pneumonia Vaccine  Completed   Hepatitis C Screening: USPSTF Recommendation to screen - Ages 18-79 yo.  Completed   HPV Vaccine  Aged Out    Advanced directives: yes  Conditions/risks identified: low falls risk  Next appointment: Follow up in one year for your annual wellness visit 10/08/2023  :45 am telephone   Preventive Care 65 Years and Older, Female Preventive care refers to lifestyle choices and visits with your health care provider that can promote health and wellness. What does preventive care include? A yearly physical exam. This is also called an annual well check. Dental exams once or twice a year. Routine eye exams. Ask your health care provider how often you should have your eyes checked. Personal lifestyle choices, including: Daily care of your teeth and gums. Regular physical activity. Eating a healthy diet. Avoiding tobacco and drug use. Limiting alcohol use. Practicing safe sex. Taking  low-dose aspirin every day. Taking vitamin and mineral supplements as recommended by your health care provider. What happens during an annual well check? The services and screenings done by your health care provider during your annual well check will depend on your age, overall health, lifestyle risk factors, and family history of disease. Counseling  Your health care provider may ask you questions about your: Alcohol use. Tobacco use. Drug use. Emotional well-being. Home and relationship well-being. Sexual activity. Eating habits. History of falls. Memory and ability to understand (cognition). Work and work Astronomer. Reproductive health. Screening  You may have the following tests or measurements: Height, weight, and BMI. Blood pressure. Lipid and cholesterol levels. These may be checked every 5 years, or more frequently if you are over 61 years old. Skin check. Lung cancer screening. You may have this screening every year starting at age 36 if you have a 30-pack-year history of smoking and currently smoke or have quit within the past 15 years. Fecal occult blood test (FOBT) of the stool. You may have this test every year starting at age 71. Flexible sigmoidoscopy or colonoscopy. You may have a sigmoidoscopy every 5 years or a colonoscopy every 10 years starting at age 42. Hepatitis C blood test. Hepatitis B blood test. Sexually transmitted disease (STD) testing. Diabetes screening. This is done by checking your blood sugar (glucose) after you have not  eaten for a while (fasting). You may have this done every 1-3 years. Bone density scan. This is done to screen for osteoporosis. You may have this done starting at age 25. Mammogram. This may be done every 1-2 years. Talk to your health care provider about how often you should have regular mammograms. Talk with your health care provider about your test results, treatment options, and if necessary, the need for more tests. Vaccines   Your health care provider may recommend certain vaccines, such as: Influenza vaccine. This is recommended every year. Tetanus, diphtheria, and acellular pertussis (Tdap, Td) vaccine. You may need a Td booster every 10 years. Zoster vaccine. You may need this after age 61. Pneumococcal 13-valent conjugate (PCV13) vaccine. One dose is recommended after age 61. Pneumococcal polysaccharide (PPSV23) vaccine. One dose is recommended after age 50. Talk to your health care provider about which screenings and vaccines you need and how often you need them. This information is not intended to replace advice given to you by your health care provider. Make sure you discuss any questions you have with your health care provider. Document Released: 06/30/2015 Document Revised: 02/21/2016 Document Reviewed: 04/04/2015 Elsevier Interactive Patient Education  2017 [place] Prevention in the Home Falls can cause injuries. They can happen to people of all ages. There are many things you can do to make your home safe and to help prevent falls. What can I do on the outside of my home? Regularly fix the edges of walkways and driveways and fix any cracks. Remove anything that might make you trip as you walk through a door, such as a raised step or threshold. Trim any bushes or trees on the path to your home. Use bright outdoor lighting. Clear any walking paths of anything that might make someone trip, such as rocks or tools. Regularly check to see if handrails are loose or broken. Make sure that both sides of any steps have handrails. Any raised decks and porches should have guardrails on the edges. Have any leaves, snow, or ice cleared regularly. Use sand or salt on walking paths during winter. Clean up any spills in your garage right away. This includes oil or grease spills. What can I do in the bathroom? Use night lights. Install grab bars by the toilet and in the tub and shower. Do not use towel  bars as grab bars. Use non-skid mats or decals in the tub or shower. If you need to sit down in the shower, use a plastic, non-slip stool. Keep the floor dry. Clean up any water that spills on the floor as soon as it happens. Remove soap buildup in the tub or shower regularly. Attach bath mats securely with double-sided non-slip rug tape. Do not have throw rugs and other things on the floor that can make you trip. What can I do in the bedroom? Use night lights. Make sure that you have a light by your bed that is easy to reach. Do not use any sheets or blankets that are too big for your bed. They should not hang down onto the floor. Have a firm chair that has side arms. You can use this for support while you get dressed. Do not have throw rugs and other things on the floor that can make you trip. What can I do in the kitchen? Clean up any spills right away. Avoid walking on wet floors. Keep items that you use a lot in easy-to-reach places. If you need to reach  something above you, use a strong step stool that has a grab bar. Keep electrical cords out of the way. Do not use floor polish or wax that makes floors slippery. If you must use wax, use non-skid floor wax. Do not have throw rugs and other things on the floor that can make you trip. What can I do with my stairs? Do not leave any items on the stairs. Make sure that there are handrails on both sides of the stairs and use them. Fix handrails that are broken or loose. Make sure that handrails are as long as the stairways. Check any carpeting to make sure that it is firmly attached to the stairs. Fix any carpet that is loose or worn. Avoid having throw rugs at the top or bottom of the stairs. If you do have throw rugs, attach them to the floor with carpet tape. Make sure that you have a light switch at the top of the stairs and the bottom of the stairs. If you do not have them, ask someone to add them for you. What else can I do to help  prevent falls? Wear shoes that: Do not have high heels. Have rubber bottoms. Are comfortable and fit you well. Are closed at the toe. Do not wear sandals. If you use a stepladder: Make sure that it is fully opened. Do not climb a closed stepladder. Make sure that both sides of the stepladder are locked into place. Ask someone to hold it for you, if possible. Clearly mark and make sure that you can see: Any grab bars or handrails. First and last steps. Where the edge of each step is. Use tools that help you move around (mobility aids) if they are needed. These include: Canes. Walkers. Scooters. Crutches. Turn on the lights when you go into a dark area. Replace any light bulbs as soon as they burn out. Set up your furniture so you have a clear path. Avoid moving your furniture around. If any of your floors are uneven, fix them. If there are any pets around you, be aware of where they are. Review your medicines with your doctor. Some medicines can make you feel dizzy. This can increase your chance of falling. Ask your doctor what other things that you can do to help prevent falls. This information is not intended to replace advice given to you by your health care provider. Make sure you discuss any questions you have with your health care provider. Document Released: 03/30/2009 Document Revised: 11/09/2015 Document Reviewed: 07/08/2014 Elsevier Interactive Patient Education  2017 [employer].

## 2022-10-07 NOTE — Progress Notes (Signed)
I connected with  April Best on 10/07/22 by a audio enabled telemedicine application and verified that I am speaking with the correct person using two identifiers.  Patient Location: Home  Provider Location: Office/Clinic  I discussed the limitations of evaluation and management by telemedicine. The patient expressed understanding and agreed to proceed.  Subjective:   April Best is a 71 y.o. female who presents for Medicare Annual (Subsequent) preventive examination.  Review of Systems    Cardiac Risk Factors include: advanced age (>59men, >19 women);smoking/ tobacco exposure;sedentary lifestyle;hypertension;dyslipidemia     Objective:    Today's Vitals   10/07/22 1056  Weight: 116 lb (52.6 kg)  Height:  (1.499 m)  PainSc: 5    Body mass index is 23.43 kg/m.     10/07/2022   11:06 AM 08/07/2022    1:41 PM 03/19/2022    8:52 AM 02/24/2022    6:25 AM 09/24/2021    2:26 PM 07/30/2021    3:17 PM 04/17/2021    4:07 PM  Advanced Directives  Does Patient Have a Medical Advance Directive? Yes No Yes No Yes Yes Yes  Type of Merchandiser, retail of [place];Living will Healthcare Power of [place];Living will Healthcare Power of [place];Living will  Does patient want to make changes to medical advance directive?     Yes (Inpatient - patient defers changing a medical advance directive and declines information at this time)    Copy of Healthcare Power of Attorney in Chart?     Yes - validated most recent copy scanned in chart (See row information)    Would patient like information on creating a medical advance directive?  No - Patient declined  No - Patient declined       Current Medications (verified) Outpatient Encounter Medications as of 10/07/2022  Medication Sig   hydrocortisone 2.5 % ointment Apply topically 2 (two) times daily.   amLODipine (NORVASC) 5 MG tablet TAKE 1 TABLET BY MOUTH DAILY   aspirin 81 MG chewable tablet Chew 1 tablet (81 mg  total) by mouth daily.   azelastine (ASTELIN) 0.1 % nasal spray Place 1 spray into both nostrils daily as needed.   benzonatate (TESSALON) 200 MG capsule Take 1 capsule (200 mg total) by mouth 2 (two) times daily as needed for cough. (Patient not taking: Reported on 08/15/2022)   Budeson-Glycopyrrol-Formoterol (BREZTRI AEROSPHERE) 160-9-4.8 MCG/ACT AERO Inhale 2 puffs into the lungs 2 (two) times daily.   clobetasol ointment (TEMOVATE) 0.05 % APP TOPICALLY TO GLUTEAL AREA ONCE D PRF DERMATITIS   COLLAGEN PO Take 30 mLs by mouth.   diclofenac Sodium (VOLTAREN) 1 % GEL Apply 2 g topically 4 (four) times daily.   DULoxetine (CYMBALTA) 20 MG capsule Cymbalta 20 mg capsule,delayed release  Take 1 capsule twice a day by oral route.   escitalopram (LEXAPRO) 20 MG tablet TAKE ONE TABLET BY MOUTH EVERY DAY   ezetimibe (ZETIA) 10 MG tablet Take 1 tablet (10 mg total) by mouth daily.   fluticasone (FLONASE) 50 MCG/ACT nasal spray Place 2 sprays into both nostrils daily.   gabapentin (NEURONTIN) 100 MG capsule Take 100 mg by mouth 2 (two) times daily as needed.   gabapentin (NEURONTIN) 100 MG capsule Take 1 capsule by mouth every morning.   gabapentin (NEURONTIN) 300 MG capsule Take 300 mg by mouth as needed (qhs). PRN   gentamicin ointment (GARAMYCIN) 0.1 % Apply 1 application  topically 3 (three) times daily.   loratadine (CLARITIN)  10 MG tablet Take 10 mg by mouth daily.   losartan (COZAAR) 25 MG tablet Take 1 tablet (25 mg total) by mouth daily.   methocarbamol (ROBAXIN) 750 MG tablet methocarbamol 750 mg tablet  Take 1 tablet 3 times a day by oral route for 30 days.   nitroGLYCERIN (NITROSTAT) 0.4 MG SL tablet Place 1 tablet (0.4 mg total) under the tongue every 5 (five) minutes as needed for chest pain.   nystatin (MYCOSTATIN) 100000 UNIT/ML suspension Use as directed 5 mLs (500,000 Units total) in the mouth or throat 4 (four) times daily. (Patient not taking: Reported on 10/07/2022)   ondansetron  (ZOFRAN-ODT) 4 MG disintegrating tablet Take 1 tablet (4 mg total) by mouth every 8 (eight) hours as needed for nausea or vomiting.   oxyCODONE-acetaminophen (PERCOCET) 10-325 MG tablet Take 1 tablet by mouth every 4 (four) hours as needed for pain.   pantoprazole (PROTONIX) 40 MG tablet Take 1 tablet (40 mg total) by mouth 2 (two) times daily before a meal.   potassium chloride (KLOR-CON M) 10 MEQ tablet TAKE TWO TABLETS BY MOUTH EVERY DAY   predniSONE (DELTASONE) 10 MG tablet Take 1 tablet by mouth daily.   rosuvastatin (CRESTOR) 5 MG tablet TAKE 1 TABLET BY MOUTH DAILY (Patient not taking: Reported on 08/15/2022)   traZODone (DESYREL) 100 MG tablet Take 0.5-1 tablets (50-100 mg total) by mouth at bedtime.   triamcinolone cream (KENALOG) 0.1 % Apply topically 2 (two) times daily as needed.   No facility-administered encounter medications on file as of 10/07/2022.    Allergies (verified) Enbrel [etanercept], Modafinil, Augmentin  [amoxicillin-pot clavulanate], Cefdinir, and Lisinopril   History: Past Medical History:  Diagnosis Date   Allergic rhinitis    Allergy    Annular oral lichen planus    Anxiety    Arthritis    Depression    Deviated nasal septum    nasal obstruction   Guaiac positive stools    history   H. 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   ABDOMINOPLASTY     APPENDECTOMY     BREAST BIOPSY Right    neg- core   COLONOSCOPY  03/2006   COLONOSCOPY WITH PROPOFOL N/A 11/19/2017   Procedure: COLONOSCOPY WITH PROPOFOL;  Surgeon: Toledo, Boykin Nearing, MD;  Location: [facility] ENDOSCOPY;  Service: Gastroenterology;   Laterality: N/A;   CORONARY/GRAFT ACUTE MI REVASCULARIZATION N/A 02/24/2022   Procedure: Coronary/Graft Acute MI Revascularization;  Surgeon: Iran Ouch, MD;  Location: [facility] INVASIVE CV LAB;  Service: Cardiovascular;  Laterality: N/A;   ENDOSCOPIC CONCHA BULLOSA RESECTION Right 01/30/2021   Procedure: ENDOSCOPIC CONCHA BULLOSA RESECTION;  Surgeon: Geanie Logan, MD;  Location: [facility];  Service: ENT;  Laterality: Right;   ESOPHAGOGASTRODUODENOSCOPY N/A 03/19/2022   Procedure: ESOPHAGOGASTRODUODENOSCOPY (EGD);  Surgeon: Regis Bill, MD;  Location[facility];  Service: Gastroenterology;  Laterality: N/A;   LEFT HEART CATH AND CORONARY ANGIOGRAPHY N/A 02/24/2022   Procedure: LEFT HEART CATH AND CORONARY ANGIOGRAPHY;  Surgeon: Iran Ouch, MD;  Location: [facility] INVASIVE CV LAB;  Service: Cardiovascular;  Laterality: N/A;   RHINOPLASTY  2002   SEPTOPLASTY N/A 01/30/2021   Procedure: SEPTOPLASTY;  Surgeon: Geanie Logan, MD;  Location: [facility];  Service: ENT;  Laterality: N/A;   UPPER GI ENDOSCOPY  2008   Family History  Problem Relation Age of Onset   Hypertension Mother    Hyperlipidemia Mother    Hypothyroidism Mother    Fibromyalgia Mother    Hearing loss Mother    Lung cancer Father    Coronary artery disease Father    Hyperlipidemia Father    Hypertension Father    Heart attack Father    Hearing loss Sister    Hyperlipidemia Sister    Hypertension Sister    Fibromyalgia Sister    Hemochromatosis Sister    ADD / ADHD Brother    Breast cancer Neg Hx    Social History   Socioeconomic History   Marital status: Married    Spouse name: Not on file   Number of children: 1   Years of education: Not on file   Highest education level: High school graduate  Occupational History   Occupation: retired  Tobacco Use   Smoking status: Every Day    Packs/day: 1.00    Years: 49.00    Additional pack years: 0.00    Total pack years: 49.00     Types: Cigarettes   Smokeless tobacco: Never   Tobacco comments:    pt declines smoking cessation ( smoked since age 89)  Vaping Use   Vaping Use: Never used  Substance and Sexual Activity   Alcohol use: Not Currently    Alcohol/week: 0.0 standard drinks of alcohol   Drug use: No   Sexual activity: Yes    Birth control/protection: Surgical  Other Topics Concern   Not on file  Social History Narrative   Not on file   Social Determinants of Health   Financial Resource Strain: Low Risk  (10/07/2022)   Overall Financial Resource Strain (CARDIA)    Difficulty of Paying Living Expenses: Not hard at all  Food Insecurity: No Food Insecurity (10/07/2022)   Hunger Vital Sign    Worried About Running Out of Food in the Last Year: Never true    Ran Out of Food in the Last Year: Never true  Transportation Needs: No Transportation Needs (10/07/2022)   PRAPARE - Administrator, Civil Service (Medical): No    Lack of Transportation (Non-Medical): No  Physical Activity: Inactive (10/07/2022)   Exercise Vital Sign    Days of Exercise per Week: 0 days    Minutes of Exercise per Session: 0 min  Stress: No Stress Concern Present (10/07/2022)   [employer] of Occupational Health - Occupational Stress Questionnaire    Feeling of Stress : Not at all  Social Connections: Moderately Integrated (10/07/2022)   Social Connection and Isolation Panel [NHANES]    Frequency of Communication with Friends and Family: More than three times a week    Frequency of Social Gatherings with Friends and Family: Three times a week    Attends Religious Services: Never    Active Member of Clubs or Organizations: Yes    Attends Banker Meetings: Never    Marital Status: Married    Tobacco Counseling Ready to quit: Not Answered Counseling given: Not Answered Tobacco comments: pt declines smoking cessation ( smoked since age 74)   Clinical Intake:  Pre-visit preparation completed:  Yes  Pain : 0-10 Pain Score: 5  Pain Type: Chronic pain Pain Location: Back Pain Orientation: Lower Pain Descriptors / Indicators: Aching Pain Onset: More than a month ago Pain Frequency: Constant Pain Relieving Factors: pain medications, walking, heat helps sometimes  Pain Relieving Factors: pain medications, walking, heat helps sometimes  BMI - recorded: 23.43 Nutritional Status: BMI of 19-24  Normal Nutritional Risks: None Diabetes: No  How often do you need to have someone help you when you read instructions, pamphlets, or other written materials from your doctor or pharmacy?: 1 - Never  Diabetic?no  Interpreter Needed?: No  Comments: lives with husband Information entered by :: B.Jeferson Boozer,LPN   Activities of Daily Living    10/07/2022   11:07 AM 08/15/2022    1:49 PM  In your present state of health, do you have any difficulty performing the following activities:  Hearing? 0 0  Vision? 1 1  Difficulty concentrating or making decisions? 0 0  Walking or climbing stairs? 1 1  Dressing or bathing? 0 0  Doing errands, shopping? 0 0  Preparing Food and eating ? N   Using the Toilet? N   In the past six months, have you accidently leaked urine? N   Do you have problems with loss of bowel control? N   Managing your Medications? N   Managing your Finances? N   Housekeeping or managing your Housekeeping? N     Patient Care Team: Erasmo Downer, MD as PCP - General (Family Medicine) Debbe Odea, MD as PCP - Cardiology (Cardiology) Earna Coder, MD as Consulting Physician (Internal Medicine) Sherlon Handing, MD as Consulting Physician (Internal Medicine) Gay Filler, NP as Nurse Practitioner (Pain Medicine) [facility], [facility] (Optometry) Patterson Hammersmith, MD as Consulting Physician (Rheumatology) Lonell Face, MD as Consulting Physician (Neurology) Riki Altes, MD (Urology) Juanell Fairly, MD as Referring Physician (Orthopedic  Surgery) Collier Salina, MD as Referring Physician (Pain Medicine) Jordan, Jimmy J, MD as Referring Physician (Physical Medicine and Rehabilitation) Geanie Logan, MD as Referring Physician (Otolaryngology)  Indicate any recent Medical Services you may have received from other than [facility] providers in the past year (date may be approximate).     Assessment:   This is a routine wellness examination for April Best.  Hearing/Vision screen Hearing Screening - Comments:: Adequate hearing Vision Screening - Comments:: Adequate vision w/glasses [facility]  Dietary issues and exercise activities discussed: Current Exercise Habits: The patient has a physically strenuous job, but has no regular exercise apart from work., Exercise limited by: orthopedic condition(s);neurologic condition(s)   Goals Addressed             This Visit's Progress    Exercise 150 minutes per week (moderate activity)   Not on track    Recommend to exercise for 3 days a week for at least 30 minutes at a time.      Quit smoking / using tobacco   Not on track      Depression Screen    08/15/2022    1:49 PM 03/06/2022    2:20 PM 08/09/2021    1:16 PM 04/23/2021    8:40 AM 02/05/2021    1:48 PM 10/11/2020    8:36 AM 09/18/2020    2:18 PM  PHQ 2/9 Scores  PHQ - 2 Score 0 2 0 0 0 0 3  PHQ- 9 Score 0 4 4 0 3 0 8    Fall Risk    10/07/2022   10:59 AM 08/15/2022    1:49 PM  03/06/2022    2:20 PM 09/24/2021    2:26 PM 09/20/2021   12:17 PM  Fall Risk   Falls in the past year? 0 0 0 0 0  Number falls in past yr: 0 0 0 0 0  Injury with Fall? 0 0 0 0 0  Risk for fall due to : No Fall Risks No Fall Risks No Fall Risks No Fall Risks   Follow up Education provided;Falls prevention discussed  Falls evaluation completed Falls evaluation completed     FALL RISK PREVENTION PERTAINING TO THE HOME:  Any stairs in or around the home? Yes  If so, are there any without handrails? Yes  Home free of loose throw rugs in  walkways, pet beds, electrical cords, etc? Yes  Adequate lighting in your home to reduce risk of falls? Yes   ASSISTIVE DEVICES UTILIZED TO PREVENT FALLS:  Life alert? No  Use of a cane, walker or w/c? No  Grab bars in the bathroom? Yes  Shower chair or bench in shower? No  Elevated toilet seat or a handicapped toilet? No   Cognitive Function:        10/07/2022   11:09 AM 02/05/2021    1:45 PM 09/13/2019    2:21 PM 07/08/2018    3:12 PM 01/15/2017    9:07 AM  6CIT Screen  What Year? 0 points 0 points 0 points 0 points 0 points  What month? 0 points 0 points 0 points 0 points 0 points  What time? 0 points 0 points 3 points 0 points 0 points  Count back from 20 0 points 0 points 0 points 0 points 0 points  Months in reverse 0 points 0 points 0 points 0 points 0 points  Repeat phrase 0 points 2 points 0 points 2 points 2 points  Total Score 0 points 2 points 3 points 2 points 2 points    Immunizations Immunization History  Administered Date(s) Administered   Fluad Quad(high Dose 65+) 04/23/2021, 08/15/2022   Influenza Split 02/21/2010, 04/22/2012, 07/15/2016   Influenza, High Dose Seasonal PF 03/05/2017, 07/08/2018   Influenza,inj,Quad PF,6+ Mos 03/23/2015   Moderna Sars-Covid-2 Vaccination 07/23/2019, 08/20/2019   Pneumococcal Conjugate-13 01/15/2017   Pneumococcal Polysaccharide-23 04/22/2012, 07/08/2018   Tdap 02/21/2010   Zoster, Live 11/04/2013    TDAP status: Up to date  Flu Vaccine status: Up to date  Pneumococcal vaccine status: Up to date  Covid-19 vaccine status: Completed vaccines  Qualifies for Shingles Vaccine? Yes   Zostavax completed No   Shingrix Completed?: No.    Education has been provided regarding the importance of this vaccine. Patient has been advised to call insurance company to determine out of pocket expense if they have not yet received this vaccine. Advised may also receive vaccine at local pharmacy or Health Dept. Verbalized acceptance and  understanding.  Screening Tests Health Maintenance  Topic Date Due   Zoster Vaccines- Shingrix (1 of 2) Never done   COVID-19 Vaccine (3 - Moderna risk series) 09/17/2019   DTaP/Tdap/Td (2 - Td or Tdap) 02/22/2020   DEXA SCAN  09/05/2021   INFLUENZA VACCINE  01/16/2023   Lung Cancer Screening  02/25/2023   Medicare Annual Wellness (AWV)  10/07/2023   MAMMOGRAM  03/06/2024   COLONOSCOPY (Pts 45-25yrs Insurance coverage will need to be confirmed)  11/20/2027   Pneumonia Vaccine 44+ Years old  Completed   Hepatitis C Screening  Completed   HPV VACCINES  Aged Out    Health Maintenance  Health Maintenance Due  Topic Date Due   Zoster Vaccines- Shingrix (1 of 2) Never done   COVID-19 Vaccine (3 - Moderna risk series) 09/17/2019   DTaP/Tdap/Td (2 - Td or Tdap) 02/22/2020   DEXA SCAN  09/05/2021    Colorectal cancer screening: Type of screening: Colonoscopy. Completed yes. Repeat every 10 years  Mammogram status: Completed yes. Repeat every year  Bone Density status: Completed yes. Results reflect: Bone density results: OSTEOPOROSIS. Repeat every 5 years.  Lung Cancer Screening: (Low Dose CT Chest recommended if Age 25-80 years, 30 pack-year currently smoking OR have quit w/in 15years.) does qualify.   Lung Cancer Screening Referral: no  Additional Screening:  Hepatitis C Screening: does not qualify; Completed yes  Vision Screening: Recommended annual ophthalmology exams for early detection of glaucoma and other disorders of the eye. Is the patient up to date with their annual eye exam?  Yes  Who is the provider or what is the name of the office in which the patient attends annual eye exams? [facility] If pt is not established with a provider, would they like to be referred to a provider to establish care? No .   Dental Screening: Recommended annual dental exams for proper oral hygiene  Community Resource Referral / Chronic Care Management: CRR required this visit?  No    CCM required this visit?  No      Plan:     I have personally reviewed and noted the following in the patient's chart:   Medical and social history Use of alcohol, tobacco or illicit drugs  Current medications and supplements including opioid prescriptions. Patient is currently taking opioid prescriptions. Information provided to patient regarding non-opioid alternatives. Patient advised to discuss non-opioid treatment plan with their provider. Functional ability and status Nutritional status Physical activity Advanced directives List of other physicians Hospitalizations, surgeries, and ER visits in previous 12 months Vitals Screenings to include cognitive, depression, and falls Referrals and appointments  In addition, I have reviewed and discussed with patient certain preventive protocols, quality metrics, and best practice recommendations. A written personalized care plan for preventive services as well as general preventive health recommendations were provided to patient.     April MARKSBERRY, LPN   1/61/0960   Nurse Notes: The patient states she is doing well at her baseline dealing with chronic pain. She has no concerns or questions at this time.

## 2022-10-12 DIAGNOSIS — M545 Low back pain, unspecified: Secondary | ICD-10-CM | POA: Diagnosis not present

## 2022-10-12 DIAGNOSIS — M5416 Radiculopathy, lumbar region: Secondary | ICD-10-CM | POA: Diagnosis not present

## 2022-10-16 ENCOUNTER — Other Ambulatory Visit: Payer: Self-pay | Admitting: Family Medicine

## 2022-10-16 DIAGNOSIS — L4050 Arthropathic psoriasis, unspecified: Secondary | ICD-10-CM | POA: Diagnosis not present

## 2022-10-16 DIAGNOSIS — F410 Panic disorder [episodic paroxysmal anxiety] without agoraphobia: Secondary | ICD-10-CM

## 2022-10-17 DIAGNOSIS — Z79899 Other long term (current) drug therapy: Secondary | ICD-10-CM | POA: Diagnosis not present

## 2022-10-17 DIAGNOSIS — G894 Chronic pain syndrome: Secondary | ICD-10-CM | POA: Diagnosis not present

## 2022-10-17 DIAGNOSIS — M5481 Occipital neuralgia: Secondary | ICD-10-CM | POA: Diagnosis not present

## 2022-10-17 DIAGNOSIS — M8438XA Stress fracture, other site, initial encounter for fracture: Secondary | ICD-10-CM | POA: Diagnosis not present

## 2022-10-17 DIAGNOSIS — M5416 Radiculopathy, lumbar region: Secondary | ICD-10-CM | POA: Diagnosis not present

## 2022-10-17 DIAGNOSIS — N281 Cyst of kidney, acquired: Secondary | ICD-10-CM | POA: Diagnosis not present

## 2022-10-17 DIAGNOSIS — R110 Nausea: Secondary | ICD-10-CM | POA: Diagnosis not present

## 2022-10-17 DIAGNOSIS — F418 Other specified anxiety disorders: Secondary | ICD-10-CM | POA: Diagnosis not present

## 2022-10-17 DIAGNOSIS — M25559 Pain in unspecified hip: Secondary | ICD-10-CM | POA: Diagnosis not present

## 2022-10-17 DIAGNOSIS — M810 Age-related osteoporosis without current pathological fracture: Secondary | ICD-10-CM | POA: Diagnosis not present

## 2022-10-17 DIAGNOSIS — M5412 Radiculopathy, cervical region: Secondary | ICD-10-CM | POA: Diagnosis not present

## 2022-10-17 DIAGNOSIS — M5459 Other low back pain: Secondary | ICD-10-CM | POA: Diagnosis not present

## 2022-10-21 ENCOUNTER — Other Ambulatory Visit: Payer: Self-pay | Admitting: Family Medicine

## 2022-10-21 DIAGNOSIS — E876 Hypokalemia: Secondary | ICD-10-CM

## 2022-10-22 DIAGNOSIS — M5416 Radiculopathy, lumbar region: Secondary | ICD-10-CM | POA: Diagnosis not present

## 2022-10-23 ENCOUNTER — Other Ambulatory Visit
Admission: RE | Admit: 2022-10-23 | Discharge: 2022-10-23 | Disposition: A | Discharge status: Home / Self Care | Payer: PPO | Source: Ambulatory Visit | Attending: Cardiology | Admitting: Cardiology

## 2022-10-23 DIAGNOSIS — E7800 Pure hypercholesterolemia, unspecified: Secondary | ICD-10-CM | POA: Diagnosis not present

## 2022-10-23 LAB — LIPID PANEL
Cholesterol: 196 mg/dL (ref 0–200)
HDL: 62 mg/dL (ref >40)
LDL Cholesterol: 116 mg/dL — ABNORMAL HIGH (ref 0–99)
Total CHOL/HDL Ratio: 3.2 RATIO
Triglycerides: 91 mg/dL (ref <150)
VLDL: 18 mg/dL (ref 0–40)

## 2022-10-24 ENCOUNTER — Encounter: Payer: Self-pay | Admitting: Cardiology

## 2022-10-24 ENCOUNTER — Ambulatory Visit: Payer: PPO | Attending: Cardiology | Admitting: Cardiology

## 2022-10-24 VITALS — BP 122/72 | Ht 59.0 in | Wt 116.6 lb

## 2022-10-24 DIAGNOSIS — F17200 Nicotine dependence, unspecified, uncomplicated: Secondary | ICD-10-CM | POA: Diagnosis not present

## 2022-10-24 DIAGNOSIS — I2510 Atherosclerotic heart disease of native coronary artery without angina pectoris: Secondary | ICD-10-CM

## 2022-10-24 DIAGNOSIS — E7800 Pure hypercholesterolemia, unspecified: Secondary | ICD-10-CM

## 2022-10-24 DIAGNOSIS — I10 Essential (primary) hypertension: Secondary | ICD-10-CM

## 2022-10-24 MED ORDER — NEXLIZET 180-10 MG PO TABS: 1.0000 | ORAL_TABLET | Freq: Every day | ORAL | 11 refills | Status: DC

## 2022-10-24 NOTE — Progress Notes (Signed)
Cardiology Office Note:    Date:  10/24/2022   ID:  April Best, DOB 1950/07/11, MRN [id number]  PCP:  Erasmo Downer, MD   [facility] Providers Cardiologist:  Debbe Odea, MD     Referring MD: Erasmo Downer, MD   Chief Complaint  Patient presents with   Follow-up    Patient denies new or acute cardiac problems/concerns today.  Has started an infusion for RA since last visit.     History of Present Illness:    April Best is a 72 y.o. female with a hx of hypertension, hyperlipidemia, nonobstructive CAD (30% pLAD, 10% RCA), current smoker x40+ years presenting for follow-up.  Previously started on Zetia for hyperlipidemia.  Tolerating Zetia with no adverse effects.  Has chronic back pain, will likely need surgery in the near future.  Prior notes Echo 02/24/2022 showed normal EF 60 to 65% Left heart cath 02/2022 30% pLAD, 10% RCA Hx of chest pain deemed noncardiac.  Likely GERD versus esophageal spasm.  Norvasc 5 mg daily was started.  Protonix also ordered to regimen.  She has generalized joint pain, arthritis, this has been going on for years before starting Crestor use.  Also has degenerative joint disease, has some chest tenderness with palpation.  Past Medical History:  Diagnosis Date   Allergic rhinitis    Allergy    Annular oral lichen planus    Anxiety    Arthritis    Depression    Deviated nasal septum    nasal obstruction   Guaiac positive stools    history   H. 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   History of palpitations    Hyperlipidemia    Hypertension    Migraine    Osteoarthritis    Osteoporosis    Panic disorder    Psoriasis    Psoriatic arthritis (HCC)    Rheumatoid arteritis (HCC)    Tinnitus     Past Surgical History:  Procedure Laterality Date   ABDOMINAL HYSTERECTOMY  1983   Ovaries have been removed.   ABDOMINOPLASTY     APPENDECTOMY     BREAST BIOPSY  Right    neg- core   COLONOSCOPY  03/2006   COLONOSCOPY WITH PROPOFOL N/A 11/19/2017   Procedure: COLONOSCOPY WITH PROPOFOL;  Surgeon: Toledo, Boykin Nearing, MD;  Location: [facility] END[facility];  Service: Gastroenterology;  Laterality: N/A;   CORONARY/GRAFT ACUTE MI REVASCULARIZATION N/A 02/24/2022   Procedure: Coronary/Graft Acute MI Revascularization;  Surgeon: Iran Ouch, MD;  Location: [facility];  Service: Cardiovascular;  Laterality: N/A;   ENDOSCOPIC CONCHA BULLOSA RESECTION Right 01/30/2021   Procedure: ENDOSCOPIC CONCHA BULLOSA RESECTION;  Surgeon: Geanie Logan, MD;  Location: [facility];  Service: ENT;  Laterality: Right;   ESOPHAGOGASTRODUODENOSCOPY N/A 03/19/2022   Procedure: ESOPHAGOGASTRODUODENOSCOPY (EGD);  Surgeon: Regis Bill, MD;  Location: [facility];  Service: Gastroenterology;  Laterality: N/A;   LEFT HEART CATH AND CORONARY ANGIOGRAPHY N/A 02/24/2022   Procedure: LEFT HEART CATH AND CORONARY ANGIOGRAPHY;  Surgeon: Iran Ouch, MD;  Location: [facility] CV [facility];  Service: Cardiovascular;  Laterality: N/A;   RHINOPLASTY  2002   SEPTOPLASTY N/A 01/30/2021   Procedure: SEPTOPLASTY;  Surgeon: Geanie Logan, MD;  Location: [facility];  Service: ENT;  Laterality: N/A;   UPPER GI ENDOSCOPY  2008    Current Medications: Current Meds  Medication Sig   amLODipine (NORVASC) 5 MG tablet TAKE  1 TABLET BY MOUTH DAILY   aspirin 81 MG chewable tablet Chew 1 tablet (81 mg total) by mouth daily.   azelastine (ASTELIN) 0.1 % nasal spray Place 1 spray into both nostrils daily as needed.   Bempedoic Acid-Ezetimibe (NEXLIZET) 180-10 MG TABS Take 1 tablet by mouth daily.   Budeson-Glycopyrrol-Formoterol (BREZTRI AEROSPHERE) 160-9-4.8 MCG/ACT AERO Inhale 2 puffs into the lungs 2 (two) times daily.   clobetasol ointment (TEMOVATE) 0.05 % APP TOPICALLY TO GLUTEAL AREA ONCE D PRF DERMATITIS   COLLAGEN PO Take 30 mLs by mouth.   diclofenac Sodium  (VOLTAREN) 1 % GEL Apply 2 g topically 4 (four) times daily.   DULoxetine (CYMBALTA) 20 MG capsule Cymbalta 20 mg capsule,delayed release  Take 1 capsule twice a day by oral route.   escitalopram (LEXAPRO) 20 MG tablet TAKE ONE TABLET BY MOUTH EVERY DAY   ezetimibe (ZETIA) 10 MG tablet Take 1 tablet (10 mg total) by mouth daily.   fluticasone (FLONASE) 50 MCG/ACT nasal spray Place 2 sprays into both nostrils daily.   gabapentin (NEURONTIN) 100 MG capsule Take 100 mg by mouth 2 (two) times daily as needed.   gabapentin (NEURONTIN) 100 MG capsule Take 1 capsule by mouth every morning.   gabapentin (NEURONTIN) 300 MG capsule Take 300 mg by mouth as needed (qhs). PRN   gentamicin ointment (GARAMYCIN) 0.1 % Apply 1 application  topically 3 (three) times daily.   Golimumab (SIMPONI ARIA IV) Inject into the vein every 8 (eight) weeks. Infusion for RA.   hydrocortisone 2.5 % ointment Apply topically 2 (two) times daily.   loratadine (CLARITIN) 10 MG tablet Take 10 mg by mouth daily.   losartan (COZAAR) 25 MG tablet Take 1 tablet (25 mg total) by mouth daily.   methocarbamol (ROBAXIN) 750 MG tablet methocarbamol 750 mg tablet  Take 1 tablet 3 times a day by oral route for 30 days.   nitroGLYCERIN (NITROSTAT) 0.4 MG SL tablet Place 1 tablet (0.4 mg total) under the tongue every 5 (five) minutes as needed for chest pain.   nystatin (MYCOSTATIN) 100000 UNIT/ML suspension Use as directed 5 mLs (500,000 Units total) in the mouth or throat 4 (four) times daily.   ondansetron (ZOFRAN-ODT) 4 MG disintegrating tablet Take 1 tablet (4 mg total) by mouth every 8 (eight) hours as needed for nausea or vomiting.   oxyCODONE-acetaminophen (PERCOCET) 10-325 MG tablet Take 1 tablet by mouth every 4 (four) hours as needed for pain.   pantoprazole (PROTONIX) 40 MG tablet Take 1 tablet (40 mg total) by mouth 2 (two) times daily before a meal.   potassium chloride (KLOR-CON M) 10 MEQ tablet TAKE TWO TABLETS BY MOUTH EVERY  DAY   predniSONE (DELTASONE) 10 MG tablet Take 1 tablet by mouth daily.   traZODone (DESYREL) 100 MG tablet TAKE 0.5-1 TABLET BY MOUTH AT BEDTIME.   triamcinolone cream (KENALOG) 0.1 % Apply topically 2 (two) times daily as needed.     Allergies:   Enbrel [etanercept], Modafinil, Augmentin  [amoxicillin-pot clavulanate], Cefdinir, and Lisinopril   Social History   Socioeconomic History   Marital status: Married    Spouse name: Not on file   Number of children: 1   Years of education: Not on file   Highest education level: High school graduate  Occupational History   Occupation: retired  Tobacco Use   Smoking status: Every Day    Packs/day: 1.00    Years: 49.00    Additional pack years: 0.00    Total  pack years: 49.00    Types: Cigarettes   Smokeless tobacco: Never   Tobacco comments:    pt declines smoking cessation ( smoked since age 27)  Vaping Use   Vaping Use: Never used  Substance and Sexual Activity   Alcohol use: Not Currently    Alcohol/week: 0.0 standard drinks of alcohol   Drug use: No   Sexual activity: Yes    Birth control/protection: Surgical  Other Topics Concern   Not on file  Social History Narrative   Not on file   Social Determinants of Health   Financial Resource Strain: Low Risk  (10/07/2022)   Overall Financial Resource Strain (CARDIA)    Difficulty of Paying Living Expenses: Not hard at all  Food Insecurity: No Food Insecurity (10/07/2022)   Hunger Vital Sign    Worried About Running Out of Food in the Last Year: Never true    Ran Out of Food in the Last Year: Never true  Transportation Needs: No Transportation Needs (10/07/2022)   PRAPARE - Administrator, Civil Service (Medical): No    Lack of Transportation (Non-Medical): No  Physical Activity: Inactive (10/07/2022)   Exercise Vital Sign    Days of Exercise per Week: 0 days    Minutes of Exercise per Session: 0 min  Stress: No Stress Concern Present (10/07/2022)   [employer] of Occupational [employer] - Occupational Stress Questionnaire    Feeling of Stress : Not at all  Social Connections: Moderately Integrated (10/07/2022)   Social Connection and Isolation Panel [NHANES]    Frequency of Communication with Friends and Family: More than three times a week    Frequency of Social Gatherings with Friends and Family: Three times a week    Attends Religious Services: Never    Active Member of Clubs or Organizations: Yes    Attends Banker Meetings: Never    Marital Status: Married     Family History: The patient's family history includes ADD / ADHD in her brother; Coronary artery disease in her father; Fibromyalgia in her mother and sister; Hearing loss in her mother and sister; Heart attack in her father; Hemochromatosis in her sister; Hyperlipidemia in her father, mother, and sister; Hypertension in her father, mother, and sister; Hypothyroidism in her mother; Lung cancer in her father. There is no history of Breast cancer.  ROS:   Please see the history of present illness.     All other systems reviewed and are negative.  EKGs/Labs/Other Studies Reviewed:    The following studies were reviewed today:   EKG:  EKG is  ordered today.  The ekg ordered today demonstrates normal sinus rhythm, inferior ST elevation which is unchanged from prior ECG   Recent Labs: 02/24/2022: ALT 15; B Natriuretic Peptide 83.5; BUN 13; Creatinine, Ser 0.68; Potassium 3.5; Sodium 140 07/31/2022: Hemoglobin 15.3; Platelets 357  Recent Lipid Panel    Component Value Date/Time   CHOL 196 10/23/2022 0813   CHOL 230 (H) 08/09/2021 1402   TRIG 91 10/23/2022 0813   HDL 62 10/23/2022 0813   HDL 75 08/09/2021 1402   CHOLHDL 3.2 10/23/2022 0813   VLDL 18 10/23/2022 0813   LDLCALC 116 (H) 10/23/2022 0813   LDLCALC 134 (H) 08/09/2021 1402     Risk Assessment/Calculations:               Physical Exam:    VS:  BP 122/72 (BP Location: Left Arm, Patient  Position: Sitting, Cuff Size: Normal)  Ht 4\' 11"  (1.499 m)   Wt 116 lb 9.6 oz (52.9 kg)   SpO2 93%   BMI 23.55 kg/m     Wt Readings from Last 3 Encounters:  10/24/22 116 lb 9.6 oz (52.9 kg)  10/07/22 116 lb (52.6 kg)  08/15/22 116 lb (52.6 kg)     GEN:  Well nourished, well developed in no acute distress HEENT: Normal NECK: No JVD; No carotid bruits CARDIAC: RRR, no murmurs, rubs, gallops RESPIRATORY:  Clear to auscultation without rales, wheezing or rhonchi  ABDOMEN: Soft, non-tender, non-distended MUSCULOSKELETAL:  No edema; tenderness with chest palpation SKIN: Warm and dry NEUROLOGIC:  Alert and oriented x 3 PSYCHIATRIC:  Normal affect   ASSESSMENT:    1. Coronary artery disease involving native coronary artery of native heart, unspecified whether angina present   2. Primary hypertension   3. Pure hypercholesterolemia   4. Smoking     PLAN:    In order of problems listed above:  Nonobstructive CAD, mild LAD, minimal RCA disease.  Musculoskeletal chest discomfort/tenderness with palpation.  Continue aspirin, Crestor.  Add nexlizet.  EF 60 to 65%.  Hypertension, BP controlled.  Continue losartan 25 mg daily, Norvasc 5 mg daily. Hyperlipidemia, continue Crestor 5mg  qd.  Start nexlizet to replace zetia.  Lipid panel in 3 months.  Not titrating Crestor due to musculoskeletal pain issues.. Current smoker, smoking cessation advised   Follow-up in 6 months     Medication Adjustments/Labs and Tests Ordered: Current medicines are reviewed at length with the patient today.  Concerns regarding medicines are outlined above.  Orders Placed This Encounter  Procedures   EKG 12-Lead   Meds ordered this encounter  Medications   Bempedoic Acid-Ezetimibe (NEXLIZET) 180-10 MG TABS    Sig: Take 1 tablet by mouth daily.    Dispense:  30 tablet    Refill:  11    Patient Instructions  Medication Instructions:  Your physician has recommended you make the following change in  your medication:   START Nexlizet 180 mg once daily  ONCE started on Nexilzet STOP Zetia (ezetimibe)   *If you need a refill on your cardiac medications before your next appointment, please call your pharmacy*   Lab Work: Lipid panel in 3 months after you start Nexlizet. These are fasting labs so nothing to eat or drink after midnight before except sip of water with your medications. No appointment is needed. Go to the [facility] entrance and check in at registration desk.   If you have labs (blood work) drawn today and your tests are completely normal, you will receive your results only by: MyChart Message (if you have MyChart) OR A paper copy in the mail If you have any lab test that is abnormal or we need to change your treatment, we will call you to review the results.   Testing/Procedures: None   Follow-Up: At [facility], you and your health needs are our priority.  As part of our continuing mission to provide you with exceptional heart care, we have created designated Provider Care Teams.  These Care Teams include your primary Cardiologist (physician) and Advanced Practice Providers (APPs -  Physician Assistants and Nurse Practitioners) who all work together to provide you with the care you need, when you need it.   Your next appointment:   6 month(s)  Provider:   Debbe Odea, MD     Signed, Debbe Odea, MD  10/24/2022 12:33 PM    [facility]

## 2022-10-24 NOTE — Patient Instructions (Addendum)
Medication Instructions:  Your physician has recommended you make the following change in your medication:   START Nexlizet 180 mg once daily  ONCE started on Nexilzet STOP Zetia (ezetimibe)   *If you need a refill on your cardiac medications before your next appointment, please call your pharmacy*   Lab Work: Lipid panel in 3 months after you start Nexlizet. These are fasting labs so nothing to eat or drink after midnight before except sip of water with your medications. No appointment is needed. Go to the [facility] entrance and check in at registration desk.   If you have labs (blood work) drawn today and your tests are completely normal, you will receive your results only by: MyChart Message (if you have MyChart) OR A paper copy in the mail If you have any lab test that is abnormal or we need to change your treatment, we will call you to review the results.   Testing/Procedures: None   Follow-Up: At [facility], you and your health needs are our priority.  As part of our continuing mission to provide you with exceptional heart care, we have created designated Provider Care Teams.  These Care Teams include your primary Cardiologist (physician) and Advanced Practice Providers (APPs -  Physician Assistants and Nurse Practitioners) who all work together to provide you with the care you need, when you need it.   Your next appointment:   6 month(s)  Provider:   Debbe Odea, MD

## 2022-10-29 ENCOUNTER — Other Ambulatory Visit (HOSPITAL_COMMUNITY): Payer: Self-pay

## 2022-10-29 ENCOUNTER — Encounter: Payer: Self-pay | Admitting: Internal Medicine

## 2022-10-29 ENCOUNTER — Telehealth: Payer: Self-pay

## 2022-10-29 NOTE — Telephone Encounter (Signed)
Pharmacy Patient Advocate Encounter   Received notification from [facility] that prior authorization for NEXLIZET 180-10MG  is required/requested.   PA submitted on 5.14.24 to (ins) [employer] via FAX   Status is pending

## 2022-10-30 NOTE — Telephone Encounter (Signed)
Pharmacy Patient Advocate Encounter  Prior Authorization for NEXLIZET 180-10MG  has been approved by [employer]  (ins).     Effective dates: 5.15.24 through 11.15.24

## 2022-11-01 ENCOUNTER — Other Ambulatory Visit: Payer: Self-pay | Admitting: *Deleted

## 2022-11-01 MED ORDER — EZETIMIBE 10 MG PO TABS: 10.0000 mg | ORAL_TABLET | Freq: Every day | ORAL | 3 refills | Status: DC

## 2022-11-28 ENCOUNTER — Other Ambulatory Visit: Payer: Self-pay | Admitting: Family Medicine

## 2022-11-28 DIAGNOSIS — I252 Old myocardial infarction: Secondary | ICD-10-CM

## 2022-12-11 DIAGNOSIS — L4050 Arthropathic psoriasis, unspecified: Secondary | ICD-10-CM | POA: Diagnosis not present

## 2022-12-11 DIAGNOSIS — R7989 Other specified abnormal findings of blood chemistry: Secondary | ICD-10-CM | POA: Diagnosis not present

## 2022-12-11 DIAGNOSIS — M810 Age-related osteoporosis without current pathological fracture: Secondary | ICD-10-CM | POA: Diagnosis not present

## 2022-12-31 DIAGNOSIS — M810 Age-related osteoporosis without current pathological fracture: Secondary | ICD-10-CM | POA: Diagnosis not present

## 2022-12-31 DIAGNOSIS — M25559 Pain in unspecified hip: Secondary | ICD-10-CM | POA: Diagnosis not present

## 2022-12-31 DIAGNOSIS — G894 Chronic pain syndrome: Secondary | ICD-10-CM | POA: Diagnosis not present

## 2022-12-31 DIAGNOSIS — M5481 Occipital neuralgia: Secondary | ICD-10-CM | POA: Diagnosis not present

## 2022-12-31 DIAGNOSIS — M5459 Other low back pain: Secondary | ICD-10-CM | POA: Diagnosis not present

## 2022-12-31 DIAGNOSIS — R110 Nausea: Secondary | ICD-10-CM | POA: Diagnosis not present

## 2022-12-31 DIAGNOSIS — M5416 Radiculopathy, lumbar region: Secondary | ICD-10-CM | POA: Diagnosis not present

## 2022-12-31 DIAGNOSIS — M25511 Pain in right shoulder: Secondary | ICD-10-CM | POA: Diagnosis not present

## 2022-12-31 DIAGNOSIS — M8438XA Stress fracture, other site, initial encounter for fracture: Secondary | ICD-10-CM | POA: Diagnosis not present

## 2022-12-31 DIAGNOSIS — Z79899 Other long term (current) drug therapy: Secondary | ICD-10-CM | POA: Diagnosis not present

## 2022-12-31 DIAGNOSIS — M5412 Radiculopathy, cervical region: Secondary | ICD-10-CM | POA: Diagnosis not present

## 2023-01-01 ENCOUNTER — Other Ambulatory Visit: Payer: Self-pay | Admitting: Family Medicine

## 2023-01-01 DIAGNOSIS — I252 Old myocardial infarction: Secondary | ICD-10-CM

## 2023-01-26 ENCOUNTER — Ambulatory Visit: Payer: Self-pay

## 2023-01-27 DIAGNOSIS — M5416 Radiculopathy, lumbar region: Secondary | ICD-10-CM | POA: Diagnosis not present

## 2023-01-27 DIAGNOSIS — M7541 Impingement syndrome of right shoulder: Secondary | ICD-10-CM | POA: Diagnosis not present

## 2023-01-27 DIAGNOSIS — M5412 Radiculopathy, cervical region: Secondary | ICD-10-CM | POA: Diagnosis not present

## 2023-01-28 ENCOUNTER — Inpatient Hospital Stay: Payer: PPO | Attending: Internal Medicine

## 2023-01-28 DIAGNOSIS — D72829 Elevated white blood cell count, unspecified: Secondary | ICD-10-CM | POA: Diagnosis not present

## 2023-01-28 DIAGNOSIS — G500 Trigeminal neuralgia: Secondary | ICD-10-CM | POA: Diagnosis not present

## 2023-01-28 DIAGNOSIS — F17210 Nicotine dependence, cigarettes, uncomplicated: Secondary | ICD-10-CM | POA: Insufficient documentation

## 2023-01-28 DIAGNOSIS — M25511 Pain in right shoulder: Secondary | ICD-10-CM | POA: Diagnosis not present

## 2023-01-28 DIAGNOSIS — F1021 Alcohol dependence, in remission: Secondary | ICD-10-CM | POA: Diagnosis not present

## 2023-01-28 DIAGNOSIS — G4700 Insomnia, unspecified: Secondary | ICD-10-CM | POA: Diagnosis not present

## 2023-01-28 DIAGNOSIS — Z801 Family history of malignant neoplasm of trachea, bronchus and lung: Secondary | ICD-10-CM | POA: Diagnosis not present

## 2023-01-28 DIAGNOSIS — E279 Disorder of adrenal gland, unspecified: Secondary | ICD-10-CM | POA: Insufficient documentation

## 2023-01-28 DIAGNOSIS — L4050 Arthropathic psoriasis, unspecified: Secondary | ICD-10-CM | POA: Diagnosis not present

## 2023-01-28 LAB — CBC WITH DIFFERENTIAL (CANCER CENTER ONLY)
Abs Immature Granulocytes: 0.44 10*3/uL — ABNORMAL HIGH (ref 0.00–0.07)
Basophils Absolute: 0.1 10*3/uL (ref 0.0–0.1)
Basophils Relative: 0 %
Eosinophils Absolute: 0 10*3/uL (ref 0.0–0.5)
Eosinophils Relative: 0 %
HCT: 46.7 % — ABNORMAL HIGH (ref 36.0–46.0)
Hemoglobin: 15.1 g/dL — ABNORMAL HIGH (ref 12.0–15.0)
Immature Granulocytes: 2 %
Lymphocytes Relative: 7 %
Lymphs Abs: 1.6 10*3/uL (ref 0.7–4.0)
MCH: 32.2 pg (ref 26.0–34.0)
MCHC: 32.3 g/dL (ref 30.0–36.0)
MCV: 99.6 fL (ref 80.0–100.0)
Monocytes Absolute: 1.1 10*3/uL — ABNORMAL HIGH (ref 0.1–1.0)
Monocytes Relative: 5 %
Neutro Abs: 18.3 10*3/uL — ABNORMAL HIGH (ref 1.7–7.7)
Neutrophils Relative %: 86 %
Platelet Count: 386 10*3/uL (ref 150–400)
RBC: 4.69 MIL/uL (ref 3.87–5.11)
RDW: 13.6 % (ref 11.5–15.5)
WBC Count: 21.5 10*3/uL — ABNORMAL HIGH (ref 4.0–10.5)
nRBC: 0 % (ref 0.0–0.2)

## 2023-01-28 LAB — FERRITIN: Ferritin: 144 ng/mL (ref 11–307)

## 2023-01-28 LAB — IRON AND TIBC
Iron: 173 ug/dL — ABNORMAL HIGH (ref 28–170)
Saturation Ratios: 52 % — ABNORMAL HIGH (ref 10.4–31.8)
TIBC: 330 ug/dL (ref 250–450)
UIBC: 157 ug/dL

## 2023-01-31 DIAGNOSIS — J301 Allergic rhinitis due to pollen: Secondary | ICD-10-CM | POA: Diagnosis not present

## 2023-01-31 DIAGNOSIS — J329 Chronic sinusitis, unspecified: Secondary | ICD-10-CM | POA: Diagnosis not present

## 2023-02-04 ENCOUNTER — Inpatient Hospital Stay (HOSPITAL_BASED_OUTPATIENT_CLINIC_OR_DEPARTMENT_OTHER): Payer: PPO | Admitting: Internal Medicine

## 2023-02-04 ENCOUNTER — Encounter: Payer: Self-pay | Admitting: Internal Medicine

## 2023-02-04 ENCOUNTER — Inpatient Hospital Stay: Payer: PPO

## 2023-02-04 DIAGNOSIS — M810 Age-related osteoporosis without current pathological fracture: Secondary | ICD-10-CM | POA: Diagnosis not present

## 2023-02-04 DIAGNOSIS — R946 Abnormal results of thyroid function studies: Secondary | ICD-10-CM | POA: Diagnosis not present

## 2023-02-04 NOTE — Progress Notes (Signed)
C/o right shoulder pain, 6/10. Tx at [facility]. Had xray of shoulder/neck.   Has trouble sleeping, takes trazodone.

## 2023-02-04 NOTE — Assessment & Plan Note (Addendum)
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  Patient continues to be asymptomatic; no evidence of organ dysfunction/overload. HOLD phlebotomy today.  # leucocytosis- neutrophilia- likely sec to steroids [for sinus infection]- monitor for now.   # Trigeminal neuralgia- [Dr.Shah]- defer to neurology- on gabapentin.   # Psoriasis arthritis-on? Enbrel-cost issue [Dr.Patel;[facility]; Rheum- Stable.    #Mild erythrocytosis hematocrit 47-likely secondary to smoking; recommend smoking cessation. Stable.   # Active smoker- LCSP-2023- CTA-reviewed negative for any malignancy.  Again counseled to quit smoking-cutting down. Stable.   DISPOSITION:  # HOLD Phlebotomy today # 6 months MD-  with labs [facility], iron studies, ferritin; 1 week prior, MD/ possible phlebotomy- Dr.B

## 2023-02-04 NOTE — Progress Notes (Signed)
[facility] OFFICE PROGRESS NOTE  Patient Care Team: Beryle Flock Marzella Schlein, MD as PCP - General (Family Medicine) Debbe Odea, MD as PCP - Cardiology (Cardiology) Earna Coder, MD as Consulting Physician (Internal Medicine) Sherlon Handing, MD as Consulting Physician (Internal Medicine) Gay Filler, NP as Nurse Practitioner (Pain Medicine) Pa, [facility] (Optometry) Patterson Hammersmith, MD as Consulting Physician (Rheumatology) Lonell Face, MD as Consulting Physician (Neurology) Riki Altes, MD (Urology) Juanell Fairly, MD as Referring Physician (Orthopedic Surgery) Collier Salina, MD as Referring Physician (Pain Medicine) Jordan, Jimmy J, MD as Referring Physician (Physical Medicine and Rehabilitation) Geanie Logan, MD as Referring Physician (Otolaryngology)   SUMMARY OF HEMATOLOGIC/ONCOLOGIC HISTORY:  # April 2015- 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 Sherryll Burger Cymbalta  INTERVAL HISTORY: Alone.  Ambulating independently.  A very pleasant 72 year-old female patient with above history of  [name] following screening  without any end organ dysfunction is here for follow-up /phlebotomy.   Patient complains of ongoing right shoulder pain, 6/10. Tx at [facility].  Also recently had xray of shoulder/neck.   Patient has chronic trouble sleeping, takes trazodone. Complains of numbness of right side of face.   Denies any worsening weight loss or abdominal swelling or leg swelling. Unfortunately she continues to smoke.  Review of Systems  Constitutional:  Positive for malaise/fatigue. Negative for chills, diaphoresis, fever and  weight loss.  HENT:  Negative for nosebleeds and sore throat.   Eyes:  Negative for double vision.  Respiratory:  Negative for cough, hemoptysis, sputum production, shortness of breath and wheezing.   Cardiovascular:  Negative for chest pain, palpitations, orthopnea and leg swelling.  Gastrointestinal:  Negative for abdominal pain, blood in stool, constipation, diarrhea, heartburn, melena, nausea and vomiting.  Genitourinary:  Negative for dysuria, frequency and urgency.  Musculoskeletal:  Positive for back pain, joint pain, myalgias and neck pain.  Skin: Negative.  Negative for itching and rash.  Neurological:  Negative for dizziness, tingling, focal weakness, weakness and headaches.  Endo/Heme/Allergies:  Bruises/bleeds easily.  Psychiatric/Behavioral:  Negative for depression. The patient is not nervous/anxious and does not have insomnia.       PAST MEDICAL HISTORY :  Past Medical History:  Diagnosis Date   Allergic rhinitis    Allergy    Annular oral lichen planus    Anxiety    Arthritis    Depression    Deviated nasal septum    nasal obstruction   Guaiac positive stools    history   H. 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   History of palpitations    Hyperlipidemia    Hypertension    Migraine    Osteoarthritis    Osteoporosis    Panic disorder    Psoriasis    Psoriatic arthritis (HCC)    Rheumatoid arteritis (HCC)    Tinnitus     PAST SURGICAL HISTORY :   Past Surgical History:  Procedure Laterality Date   ABDOMINAL HYSTERECTOMY  1983   Ovaries have been removed.   ABDOMINOPLASTY     APPENDECTOMY     BREAST BIOPSY Right    neg- core   COLONOSCOPY  03/2006   COLONOSCOPY WITH PROPOFOL N/A 11/19/2017   Procedure:  COLONOSCOPY WITH PROPOFOL;  Surgeon: Toledo, Boykin Nearing, MD;  Location: [facility] ENDOSCOPY;  Service: Gastroenterology;  Laterality: N/A;   CORONARY/GRAFT ACUTE MI REVASCULARIZATION N/A 02/24/2022   Procedure:  Coronary/Graft Acute MI Revascularization;  Surgeon: Iran Ouch, MD;  Location: [facility] INVASIVE CV LAB;  Service: Cardiovascular;  Laterality: N/A;   ENDOSCOPIC CONCHA BULLOSA RESECTION Right 01/30/2021   Procedure: ENDOSCOPIC CONCHA BULLOSA RESECTION;  Surgeon: Geanie Logan, MD;  Location: [facility];  Service: ENT;  Laterality: Right;   ESOPHAGOGASTRODUODENOSCOPY N/A 03/19/2022   Procedure: ESOPHAGOGASTRODUODENOSCOPY (EGD);  Surgeon: Regis Bill, MD;  Location: [facility] ENDOSCOPY;  Service: Gastroenterology;  Laterality: N/A;   LEFT HEART CATH AND CORONARY ANGIOGRAPHY N/A 02/24/2022   Procedure: LEFT HEART CATH AND CORONARY ANGIOGRAPHY;  Surgeon: Iran Ouch, MD;  Location: [facility] INVASIVE CV LAB;  Service: Cardiovascular;  Laterality: N/A;   RHINOPLASTY  2002   SEPTOPLASTY N/A 01/30/2021   Procedure: SEPTOPLASTY;  Surgeon: Geanie Logan, MD;  Location: [facility];  Service: ENT;  Laterality: N/A;   UPPER GI ENDOSCOPY  2008    FAMILY HISTORY :   Family History  Problem Relation Age of Onset   Hypertension Mother    Hyperlipidemia Mother    Hypothyroidism Mother    Fibromyalgia Mother    Hearing loss Mother    Lung cancer Father    Coronary artery disease Father    Hyperlipidemia Father    Hypertension Father    Heart attack Father    Hearing loss Sister    Hyperlipidemia Sister    Hypertension Sister    Fibromyalgia Sister    Hemochromatosis Sister    ADD / ADHD Brother    Breast cancer Neg Hx     SOCIAL HISTORY:   Social History   Tobacco Use   Smoking status: Every Day    Current packs/day: 1.00    Average packs/day: 1 pack/day for 49.0 years (49.0 ttl pk-yrs)    Types: Cigarettes   Smokeless tobacco: Never   Tobacco comments:    pt declines smoking cessation ( smoked since age 18)  Vaping Use   Vaping status: Never Used  Substance Use Topics   Alcohol use: Not Currently    Alcohol/week: 0.0 standard drinks of alcohol   Drug use: No     ALLERGIES:  is allergic to enbrel [etanercept], modafinil, augmentin  [amoxicillin-pot clavulanate], cefdinir, and lisinopril.  MEDICATIONS:  Current Outpatient Medications  Medication Sig Dispense Refill   amLODipine (NORVASC) 5 MG tablet TAKE 1 TABLET BY MOUTH DAILY 90 tablet 1   aspirin 81 MG chewable tablet Chew 1 tablet (81 mg total) by mouth daily. 30 tablet 0   azelastine (ASTELIN) 0.1 % nasal spray Place 1 spray into both nostrils daily as needed.     Bempedoic Acid-Ezetimibe (NEXLIZET) 180-10 MG TABS Take 1 tablet by mouth daily. 30 tablet 11   Budeson-Glycopyrrol-Formoterol (BREZTRI AEROSPHERE) 160-9-4.8 MCG/ACT AERO Inhale 2 puffs into the lungs 2 (two) times daily. 10.7 g 11   clobetasol ointment (TEMOVATE) 0.05 % APP TOPICALLY TO GLUTEAL AREA ONCE D PRF DERMATITIS 30 g 1   COLLAGEN PO Take 30 mLs by mouth.     diclofenac Sodium (VOLTAREN) 1 % GEL Apply 2 g topically 4 (four) times daily.     DULoxetine (CYMBALTA) 20 MG capsule Cymbalta 20 mg capsule,delayed release  Take 1 capsule twice a day by oral route.     escitalopram (LEXAPRO) 20 MG tablet TAKE ONE TABLET BY MOUTH EVERY DAY  90 tablet 1   ezetimibe (ZETIA) 10 MG tablet Take 1 tablet (10 mg total) by mouth daily. 30 tablet 3   fluticasone (FLONASE) 50 MCG/ACT nasal spray Place 2 sprays into both nostrils daily.     gabapentin (NEURONTIN) 100 MG capsule Take 100 mg by mouth 2 (two) times daily as needed.     gabapentin (NEURONTIN) 100 MG capsule Take 1 capsule by mouth every morning.     gabapentin (NEURONTIN) 300 MG capsule Take 300 mg by mouth as needed (qhs). PRN     gentamicin ointment (GARAMYCIN) 0.1 % Apply 1 application  topically 3 (three) times daily.     Golimumab (SIMPONI ARIA IV) Inject into the vein every 8 (eight) weeks. Infusion for RA.     hydrocortisone 2.5 % ointment Apply topically 2 (two) times daily.     loratadine (CLARITIN) 10 MG tablet Take 10 mg by mouth daily.     losartan (COZAAR) 25 MG  tablet TAKE 1 TABLET BY MOUTH DAILY 90 tablet 1   methocarbamol (ROBAXIN) 750 MG tablet methocarbamol 750 mg tablet  Take 1 tablet 3 times a day by oral route for 30 days.     nitroGLYCERIN (NITROSTAT) 0.4 MG SL tablet Place 1 tablet (0.4 mg total) under the tongue every 5 (five) minutes as needed for chest pain. 30 tablet 12   nystatin (MYCOSTATIN) 100000 UNIT/ML suspension Use as directed 5 mLs (500,000 Units total) in the mouth or throat 4 (four) times daily. 473 mL 0   ondansetron (ZOFRAN-ODT) 4 MG disintegrating tablet Take 1 tablet (4 mg total) by mouth every 8 (eight) hours as needed for nausea or vomiting. 20 tablet 0   oxyCODONE-acetaminophen (PERCOCET) 10-325 MG tablet Take 1 tablet by mouth every 4 (four) hours as needed for pain.     pantoprazole (PROTONIX) 40 MG tablet Take 1 tablet (40 mg total) by mouth 2 (two) times daily before a meal. 60 tablet 1   potassium chloride (KLOR-CON M) 10 MEQ tablet TAKE TWO TABLETS BY MOUTH EVERY DAY 30 tablet 0   predniSONE (DELTASONE) 10 MG tablet Take 1 tablet by mouth daily.     traZODone (DESYREL) 100 MG tablet TAKE 0.5-1 TABLET BY MOUTH AT BEDTIME. 30 tablet 5   triamcinolone cream (KENALOG) 0.1 % Apply topically 2 (two) times daily as needed.     benzonatate (TESSALON) 200 MG capsule Take 1 capsule (200 mg total) by mouth 2 (two) times daily as needed for cough. (Patient not taking: Reported on 08/15/2022) 20 capsule 0   No current facility-administered medications for this visit.    PHYSICAL EXAMINATION: ECOG PERFORMANCE STATUS: 0 - Asymptomatic  BP 131/71 (BP Location: Left Arm, Patient Position: Sitting, Cuff Size: Normal)   Pulse 84   Temp 98.2 F (36.8 C) (Tympanic)   Ht 4\' 11"  (1.499 m)   Wt 119 lb (54 kg)   SpO2 95%   BMI 24.04 kg/m   Filed Weights   02/04/23 1329  Weight: 119 lb (54 kg)     Physical Exam Constitutional:      Comments: Ambulating independently.  Alone.  HENT:     Head: Normocephalic and atraumatic.      Mouth/Throat:     Pharynx: No oropharyngeal exudate.  Eyes:     Pupils: Pupils are equal, round, and reactive to light.  Cardiovascular:     Rate and Rhythm: Normal rate and regular rhythm.  Pulmonary:     Effort: Pulmonary effort is normal. No respiratory  distress.     Breath sounds: Normal breath sounds. No wheezing.  Abdominal:     General: Bowel sounds are normal. There is no distension.     Palpations: Abdomen is soft. There is no mass.     Tenderness: There is no abdominal tenderness. There is no guarding or rebound.  Musculoskeletal:        General: No tenderness. Normal range of motion.     Cervical back: Normal range of motion and neck supple.  Skin:    General: Skin is warm.  Neurological:     Mental Status: She is alert and oriented to person, place, and time.  Psychiatric:        Mood and Affect: Affect normal.      LABORATORY DATA:  I have reviewed the data as listed    Component Value Date/Time   NA 140 02/24/2022 0617   NA 146 (H) 08/09/2021 1402   K 3.5 02/24/2022 0617   CL 103 02/24/2022 0617   CO2 30 02/24/2022 0617   GLUCOSE 131 (H) 02/24/2022 0617   BUN 13 02/24/2022 0617   BUN 19 08/09/2021 1402   CREATININE 0.68 02/24/2022 0617   CREATININE 0.57 05/05/2017 1653   CALCIUM 8.8 (L) 02/24/2022 0617   PROT 6.5 02/24/2022 0617   PROT 6.7 08/09/2021 1402   ALBUMIN 3.9 02/24/2022 0617   ALBUMIN 4.6 08/09/2021 1402   AST 19 02/24/2022 0617   ALT 15 02/24/2022 0617   ALKPHOS 48 02/24/2022 0617   BILITOT 0.4 02/24/2022 0617   BILITOT 0.3 08/09/2021 1402   GFRNONAA >60 02/24/2022 0617   GFRNONAA 97 05/05/2017 1653   GFRAA 83 01/20/2020 1327   GFRAA 113 05/05/2017 1653    No results found for: "SPEP", "UPEP"  Lab Results  Component Value Date   WBC 21.5 (H) 01/28/2023   NEUTROABS 18.3 (H) 01/28/2023   HGB 15.1 (H) 01/28/2023   HCT 46.7 (H) 01/28/2023   MCV 99.6 01/28/2023   PLT 386 01/28/2023      Chemistry      Component Value  Date/Time   NA 140 02/24/2022 0617   NA 146 (H) 08/09/2021 1402   K 3.5 02/24/2022 0617   CL 103 02/24/2022 0617   CO2 30 02/24/2022 0617   BUN 13 02/24/2022 0617   BUN 19 08/09/2021 1402   CREATININE 0.68 02/24/2022 0617   CREATININE 0.57 05/05/2017 1653   GLU 102 09/05/2014 0000      Component Value Date/Time   CALCIUM 8.8 (L) 02/24/2022 0617   ALKPHOS 48 02/24/2022 0617   AST 19 02/24/2022 0617   ALT 15 02/24/2022 0617   BILITOT 0.4 02/24/2022 0617   BILITOT 0.3 08/09/2021 1402       ASSESSMENT & PLAN:   [name] (HCC) # HEREDITARY  Hemochromatosis/ compound heterozygous C282Y & H63D- [> ferritin:150; Iron sa-50%]-  # AUG 2024-  iron saturations 52%; ferritin 144 [sinus infection].  Patient continues to be asymptomatic; no evidence of organ dysfunction/overload. HOLD phlebotomy today.  # leucocytosis- neutrophilia- likely sec to steroids [for sinus infection]- monitor for now.   # Trigeminal neuralgia- [Dr.Shah]- defer to neurology- on gabapentin.   # Psoriasis arthritis-on? Enbrel-cost issue [Dr.Patel;KC; Rheum- Stable.    #Mild erythrocytosis hematocrit 47-likely secondary to smoking; recommend smoking cessation. Stable.   # Active smoker- [facility]-10/22/2020 CT scan reviewed negative for any malignancy.  Again counseled to quit smoking-cutting down. Stable.   DISPOSITION:  # HOLD Phlebotomy today # 6 months with labs [  CBC, iron studies, ferritin; 1 week prior, MD/ possible phlebotomy- Dr.B      Earna Coder, MD 02/04/2023 1:55 PM

## 2023-02-04 NOTE — Progress Notes (Signed)
Per Dr. Shara Blazing phlebotomy today.

## 2023-02-11 DIAGNOSIS — L409 Psoriasis, unspecified: Secondary | ICD-10-CM | POA: Diagnosis not present

## 2023-02-11 DIAGNOSIS — M539 Dorsopathy, unspecified: Secondary | ICD-10-CM | POA: Diagnosis not present

## 2023-02-11 DIAGNOSIS — Z7960 Long term (current) use of unspecified immunomodulators and immunosuppressants: Secondary | ICD-10-CM | POA: Diagnosis not present

## 2023-02-11 DIAGNOSIS — R760 Raised antibody titer: Secondary | ICD-10-CM | POA: Diagnosis not present

## 2023-02-11 DIAGNOSIS — L4050 Arthropathic psoriasis, unspecified: Secondary | ICD-10-CM | POA: Diagnosis not present

## 2023-02-12 DIAGNOSIS — M5416 Radiculopathy, lumbar region: Secondary | ICD-10-CM | POA: Diagnosis not present

## 2023-02-24 DIAGNOSIS — M5459 Other low back pain: Secondary | ICD-10-CM | POA: Diagnosis not present

## 2023-02-24 DIAGNOSIS — M25511 Pain in right shoulder: Secondary | ICD-10-CM | POA: Diagnosis not present

## 2023-02-24 DIAGNOSIS — M542 Cervicalgia: Secondary | ICD-10-CM | POA: Diagnosis not present

## 2023-02-25 DIAGNOSIS — L4050 Arthropathic psoriasis, unspecified: Secondary | ICD-10-CM | POA: Diagnosis not present

## 2023-02-26 DIAGNOSIS — M810 Age-related osteoporosis without current pathological fracture: Secondary | ICD-10-CM | POA: Diagnosis not present

## 2023-03-05 DIAGNOSIS — M5459 Other low back pain: Secondary | ICD-10-CM | POA: Diagnosis not present

## 2023-03-05 DIAGNOSIS — R110 Nausea: Secondary | ICD-10-CM | POA: Diagnosis not present

## 2023-03-05 DIAGNOSIS — G894 Chronic pain syndrome: Secondary | ICD-10-CM | POA: Diagnosis not present

## 2023-03-05 DIAGNOSIS — Z79899 Other long term (current) drug therapy: Secondary | ICD-10-CM | POA: Diagnosis not present

## 2023-03-05 DIAGNOSIS — M25511 Pain in right shoulder: Secondary | ICD-10-CM | POA: Diagnosis not present

## 2023-03-05 DIAGNOSIS — M5412 Radiculopathy, cervical region: Secondary | ICD-10-CM | POA: Diagnosis not present

## 2023-03-05 DIAGNOSIS — M8438XA Stress fracture, other site, initial encounter for fracture: Secondary | ICD-10-CM | POA: Diagnosis not present

## 2023-03-05 DIAGNOSIS — M810 Age-related osteoporosis without current pathological fracture: Secondary | ICD-10-CM | POA: Diagnosis not present

## 2023-03-05 DIAGNOSIS — M5416 Radiculopathy, lumbar region: Secondary | ICD-10-CM | POA: Diagnosis not present

## 2023-03-05 DIAGNOSIS — M5481 Occipital neuralgia: Secondary | ICD-10-CM | POA: Diagnosis not present

## 2023-03-05 DIAGNOSIS — M25559 Pain in unspecified hip: Secondary | ICD-10-CM | POA: Diagnosis not present

## 2023-03-10 DIAGNOSIS — M5459 Other low back pain: Secondary | ICD-10-CM | POA: Diagnosis not present

## 2023-03-10 DIAGNOSIS — M25511 Pain in right shoulder: Secondary | ICD-10-CM | POA: Diagnosis not present

## 2023-03-10 DIAGNOSIS — M542 Cervicalgia: Secondary | ICD-10-CM | POA: Diagnosis not present

## 2023-03-19 DIAGNOSIS — J439 Emphysema, unspecified: Secondary | ICD-10-CM | POA: Diagnosis not present

## 2023-03-19 DIAGNOSIS — M059 Rheumatoid arthritis with rheumatoid factor, unspecified: Secondary | ICD-10-CM | POA: Diagnosis not present

## 2023-03-19 DIAGNOSIS — L4050 Arthropathic psoriasis, unspecified: Secondary | ICD-10-CM | POA: Diagnosis not present

## 2023-03-19 DIAGNOSIS — I10 Essential (primary) hypertension: Secondary | ICD-10-CM | POA: Diagnosis not present

## 2023-03-21 DIAGNOSIS — M5416 Radiculopathy, lumbar region: Secondary | ICD-10-CM | POA: Diagnosis not present

## 2023-03-24 ENCOUNTER — Other Ambulatory Visit: Payer: Self-pay | Admitting: Family Medicine

## 2023-03-24 DIAGNOSIS — M5459 Other low back pain: Secondary | ICD-10-CM | POA: Diagnosis not present

## 2023-03-24 DIAGNOSIS — E876 Hypokalemia: Secondary | ICD-10-CM

## 2023-03-24 DIAGNOSIS — M25511 Pain in right shoulder: Secondary | ICD-10-CM | POA: Diagnosis not present

## 2023-03-24 DIAGNOSIS — M542 Cervicalgia: Secondary | ICD-10-CM | POA: Diagnosis not present

## 2023-03-24 NOTE — Telephone Encounter (Signed)
Requested medications are due for refill today.  yes  Requested medications are on the active medications list.  yes  Last refill. 10/21/2022 #30 0 rf  Future visit scheduled.   yes  Notes to clinic.  Labs are expired.    Requested Prescriptions  Pending Prescriptions Disp Refills   potassium chloride (KLOR-CON M) 10 MEQ tablet [Pharmacy Med Name: POTASSIUM CHLORIDE CRYS ER 10 MEQ T] 30 tablet 0    Sig: TAKE TWO TABLETS BY MOUTH EVERY DAY     Endocrinology:  Minerals - Potassium Supplementation Failed - 03/24/2023  6:10 AM      Failed - K in normal range and within 360 days    Potassium  Date Value Ref Range Status  02/24/2022 3.5 3.5 - 5.1 mmol/L Final         Failed - Cr in normal range and within 360 days    Creat  Date Value Ref Range Status  05/05/2017 0.57 0.50 - 0.99 mg/dL Final    Comment:    For patients >64 years of age, the reference limit for Creatinine is approximately 13% higher for people identified as African-American. .    Creatinine, Ser  Date Value Ref Range Status  02/24/2022 0.68 0.44 - 1.00 mg/dL Final         Passed - Valid encounter within last 12 months    Recent Outpatient Visits           7 months ago Encounter for annual physical exam   [facility] [place], Marzella Schlein, MD   7 months ago COVID-19   [facility] [place], Darl Householder, DO   1 year ago Hospital discharge follow-up   [facility] Jacky Kindle, FNP   1 year ago Lichen planus of tongue   [facility] Jacky Kindle, FNP   1 year ago Hypercholesteremia   [facility] [place], Marzella Schlein, MD       Future Appointments             In 1 month Agbor-Etang, Arlys John, MD [facility] [place]   In 4 months Bacigalupo, Marzella Schlein, MD [facility]

## 2023-03-26 ENCOUNTER — Other Ambulatory Visit: Payer: Self-pay | Admitting: Orthopedic Surgery

## 2023-03-26 DIAGNOSIS — M5416 Radiculopathy, lumbar region: Secondary | ICD-10-CM

## 2023-03-28 ENCOUNTER — Ambulatory Visit
Admission: RE | Admit: 2023-03-28 | Discharge: 2023-03-28 | Disposition: A | Discharge status: Home / Self Care | Payer: PPO | Source: Ambulatory Visit | Attending: Orthopedic Surgery | Admitting: Orthopedic Surgery

## 2023-03-28 DIAGNOSIS — M48061 Spinal stenosis, lumbar region without neurogenic claudication: Secondary | ICD-10-CM | POA: Diagnosis not present

## 2023-03-28 DIAGNOSIS — M5416 Radiculopathy, lumbar region: Secondary | ICD-10-CM | POA: Insufficient documentation

## 2023-03-28 DIAGNOSIS — M4727 Other spondylosis with radiculopathy, lumbosacral region: Secondary | ICD-10-CM | POA: Diagnosis not present

## 2023-03-28 DIAGNOSIS — M4726 Other spondylosis with radiculopathy, lumbar region: Secondary | ICD-10-CM | POA: Diagnosis not present

## 2023-03-28 DIAGNOSIS — M5116 Intervertebral disc disorders with radiculopathy, lumbar region: Secondary | ICD-10-CM | POA: Diagnosis not present

## 2023-04-21 ENCOUNTER — Other Ambulatory Visit: Payer: Self-pay

## 2023-04-21 DIAGNOSIS — Z122 Encounter for screening for malignant neoplasm of respiratory organs: Secondary | ICD-10-CM

## 2023-04-21 DIAGNOSIS — Z87891 Personal history of nicotine dependence: Secondary | ICD-10-CM

## 2023-04-21 DIAGNOSIS — F17210 Nicotine dependence, cigarettes, uncomplicated: Secondary | ICD-10-CM

## 2023-04-22 DIAGNOSIS — L4050 Arthropathic psoriasis, unspecified: Secondary | ICD-10-CM | POA: Diagnosis not present

## 2023-04-28 ENCOUNTER — Encounter: Payer: Self-pay | Admitting: Cardiology

## 2023-04-28 ENCOUNTER — Other Ambulatory Visit: Payer: Self-pay

## 2023-04-28 ENCOUNTER — Ambulatory Visit: Payer: PPO | Attending: Cardiology | Admitting: Cardiology

## 2023-04-28 ENCOUNTER — Ambulatory Visit: Payer: PPO

## 2023-04-28 VITALS — BP 124/78 | HR 90 | Ht 59.0 in | Wt 119.0 lb

## 2023-04-28 DIAGNOSIS — Q254 Congenital malformation of aorta unspecified: Secondary | ICD-10-CM | POA: Diagnosis not present

## 2023-04-28 DIAGNOSIS — I10 Essential (primary) hypertension: Secondary | ICD-10-CM

## 2023-04-28 DIAGNOSIS — E7800 Pure hypercholesterolemia, unspecified: Secondary | ICD-10-CM | POA: Diagnosis not present

## 2023-04-28 DIAGNOSIS — IMO0001 Reserved for inherently not codable concepts without codable children: Secondary | ICD-10-CM

## 2023-04-28 DIAGNOSIS — I2510 Atherosclerotic heart disease of native coronary artery without angina pectoris: Secondary | ICD-10-CM | POA: Diagnosis not present

## 2023-04-28 DIAGNOSIS — I639 Cerebral infarction, unspecified: Secondary | ICD-10-CM

## 2023-04-28 DIAGNOSIS — F17200 Nicotine dependence, unspecified, uncomplicated: Secondary | ICD-10-CM

## 2023-04-28 MED ORDER — NEXLIZET 180-10 MG PO TABS: 1.0000 | ORAL_TABLET | Freq: Every day | ORAL | 3 refills | Status: DC

## 2023-04-28 NOTE — Patient Instructions (Signed)
Medication Instructions:   Restart Aspirin 81 - take one tablet ( 81mg ) daily.  2. Pick up Nexlizet - Take one tablet ( 10-180mg ) by mouth daily.  3. STOP Zetia - When you pick up Nexlizet  *If you need a refill on your cardiac medications before your next appointment, please call your pharmacy*   Lab Work:  Your provider would like for you to return in 3 months ( on or around 07/29/2023 ) to have the following labs drawn: LIPID.   Please go to [facility] [address] ([facility] [address], [place] [address] You do not need an appointment.  They are open from 7:30 am-4 pm.  Lunch from 1:00 pm- 2:00 pm You WILL need to be fasting.   If you have labs (blood work) drawn today and your tests are completely normal, you will receive your results only by: MyChart Message (if you have MyChart) OR A paper copy in the mail If you have any lab test that is abnormal or we need to change your treatment, we will call you to review the results.   Testing/Procedures:  Your physician has recommended that you wear a Zio monitor.   This monitor is a medical device that records the heart's electrical activity. Doctors most often use these monitors to diagnose arrhythmias. Arrhythmias are problems with the speed or rhythm of the heartbeat. The monitor is a small device applied to your chest. You can wear one while you do your normal daily activities. While wearing this monitor if you have any symptoms to push the button and record what you felt. Once you have worn this monitor for the period of time provider prescribed (Usually 14 days), you will return the monitor device in the postage paid box. Once it is returned they will download the data collected and provide [place] with a report which the provider will then review and we will call you with those results. Important tips:  Avoid showering during the first 24 hours of wearing the monitor. Avoid excessive sweating to help maximize  wear time. Do not submerge the device, no hot tubs, and no swimming pools. Keep any lotions or oils away from the patch. After 24 hours you may shower with the patch on. Take brief showers with your back facing the shower head.  Do not remove patch once it has been placed because that will interrupt data and decrease adhesive wear time. Push the button when you have any symptoms and write down what you were feeling. Once you have completed wearing your monitor, remove and place into box which has postage paid and place in your outgoing mailbox.  If for some reason you have misplaced your box then call our office and we can provide another box and/or mail it off for you.   Follow-Up: At [facility], you and your health needs are our priority.  As part of our continuing mission to provide you with exceptional heart care, we have created designated Provider Care Teams.  These Care Teams include your primary Cardiologist (physician) and Advanced Practice Providers (APPs -  Physician Assistants and Nurse Practitioners) who all work together to provide you with the care you need, when you need it.  We recommend signing up for the patient portal called "MyChart".  Sign up information is provided on this After Visit Summary.  MyChart is used to connect with patients for Virtual Visits (Telemedicine).  Patients are able to view lab/test results, encounter notes, upcoming appointments, etc.  Non-urgent messages can be sent to your provider as well.   To learn more about what you can do with MyChart, go to [email or web address].    Your next appointment:   3 month(s)  Provider:   You may see Debbe Odea, MD or one of the following Advanced Practice Providers on your designated Care Team:   Nicolasa Ducking, NP Eula Listen, PA-C Cadence Fransico Michael, PA-C Charlsie Quest, NP Carlos Levering, NP

## 2023-04-28 NOTE — Progress Notes (Signed)
Cardiology Office Note:    Date:  04/28/2023   ID:  April Best, DOB 10/28/1950, MRN [id number]  PCP:  Erasmo Downer, MD   [facility] Providers Cardiologist:  Debbe Odea, MD     Referring MD: Erasmo Downer, MD   Chief Complaint  Patient presents with   Follow-up    Patient would like to discuss abdominal aorta abnormality shown on 03/28/23 CT.    History of Present Illness:    April Best is a 72 y.o. female with a hx of hypertension, hyperlipidemia, nonobstructive CAD (30% pLAD, 10% RCA on [facility] 9/23), current smoker x40+ years presenting for follow-up.  She had a lumbar CT on 03/28/2023 due to chronic low back pain, focal outpouching of the infrarenal abdominal aorta noted, possible aneurysm.  Endorses muscle aches with taking statins.  Low-dose Crestor previously stopped with improvement in muscle aches.  States having right facial numbness, brain MRI about 10 months ago showed old lacunar infarcts.  Stop taking aspirin, unsure why.  Prior notes Echo 02/24/2022 showed normal EF 60 to 65% Left heart cath 02/2022 30% pLAD, 10% RCA Hx of chest pain deemed noncardiac.  Likely GERD versus esophageal spasm.  Norvasc 5 mg daily was started.  Protonix also ordered to regimen.  She has generalized joint pain, arthritis, this has been going on for years before starting Crestor use.  Also has degenerative joint disease, has some chest tenderness with palpation.  Past Medical History:  Diagnosis Date   Allergic rhinitis    Allergy    Annular oral lichen planus    Anxiety    Arthritis    Depression    Deviated nasal septum    nasal obstruction   Guaiac positive stools    history   H. 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   ABDOMINOPLASTY     APPENDECTOMY     BREAST BIOPSY Right    neg- core   COLONOSCOPY  03/2006   COLONOSCOPY WITH PROPOFOL N/A 11/19/2017   Procedure: COLONOSCOPY WITH PROPOFOL;  Surgeon: Toledo, Boykin Nearing, MD;  Location: [facility] ENDOSCOPY;  Service: Gastroenterology;  Laterality: N/A;   CORONARY/GRAFT ACUTE MI REVASCULARIZATION N/A 02/24/2022   Procedure: Coronary/Graft Acute MI Revascularization;  Surgeon: Iran Ouch, MD;  Location: [facility] INVASIVE CV LAB;  Service: Cardiovascular;  Laterality: N/A;   ENDOSCOPIC CONCHA BULLOSA RESECTION Right 01/30/2021   Procedure: ENDOSCOPIC CONCHA BULLOSA RESECTION;  Surgeon: Geanie Logan, MD;  Location: [facility]ERY [facility];  Service: ENT;  Laterality: Right;   ESOPHAGOGASTRODUODENOSCOPY N/A 03/19/2022   Procedure: ESOPHAGOGASTRODUODENOSCOPY (EGD);  Surgeon: Regis Bill, MD;  Location: [facility] ENDOSCOPY;  Service: Gastroenterology;  Laterality: N/A;   LEFT HEART CATH AND CORONARY ANGIOGRAPHY N/A 02/24/2022   Procedure: LEFT HEART CATH AND CORONARY ANGIOGRAPHY;  Surgeon: Iran Ouch, MD;  Location: [facility] INVASIVE CV LAB;  Service: Cardiovascular;  Laterality: N/A;   RHINOPLASTY  2002   SEPTOPLASTY N/A 01/30/2021   Procedure: SEPTOPLASTY;  Surgeon: Geanie Logan, MD;  Location: [facility] SURGERY  [facility];  Service: ENT;  Laterality: N/A;   UPPER GI ENDOSCOPY  2008    Current Medications: Current Meds  Medication Sig   amLODipine (NORVASC) 5 MG tablet TAKE 1 TABLET BY MOUTH DAILY   azelastine (ASTELIN) 0.1 % nasal spray Place 1 spray into both nostrils daily as needed.   Bempedoic Acid-Ezetimibe (NEXLIZET) 180-10 MG TABS Take 1 tablet by mouth daily.   clobetasol ointment (TEMOVATE) 0.05 % APP TOPICALLY TO GLUTEAL AREA ONCE D PRF DERMATITIS   COLLAGEN PO Take 30 mLs by mouth.   diclofenac Sodium  (VOLTAREN) 1 % GEL Apply 2 g topically 4 (four) times daily.   DULoxetine (CYMBALTA) 20 MG capsule Cymbalta 20 mg capsule,delayed release  Take 1 capsule twice a day by oral route.   escitalopram (LEXAPRO) 20 MG tablet TAKE ONE TABLET BY MOUTH EVERY DAY   ezetimibe (ZETIA) 10 MG tablet Take 1 tablet (10 mg total) by mouth daily.   fluticasone (FLONASE) 50 MCG/ACT nasal spray Place 2 sprays into both nostrils daily.   gabapentin (NEURONTIN) 100 MG capsule Take 100 mg by mouth 2 (two) times daily as needed.   gabapentin (NEURONTIN) 100 MG capsule Take 1 capsule by mouth every morning.   gabapentin (NEURONTIN) 300 MG capsule Take 300 mg by mouth as needed (qhs). PRN   gentamicin ointment (GARAMYCIN) 0.1 % Apply 1 application  topically 3 (three) times daily.   Golimumab (SIMPONI ARIA IV) Inject into the vein every 8 (eight) weeks. Infusion for RA.   Golimumab (SIMPONI ARIA IV) Inject into the vein every 8 (eight) weeks.   hydrocortisone 2.5 % ointment Apply topically 2 (two) times daily.   loratadine (CLARITIN) 10 MG tablet Take 10 mg by mouth daily.   losartan (COZAAR) 25 MG tablet TAKE 1 TABLET BY MOUTH DAILY   methocarbamol (ROBAXIN) 750 MG tablet methocarbamol 750 mg tablet  Take 1 tablet 3 times a day by oral route for 30 days.   nitroGLYCERIN (NITROSTAT) 0.4 MG SL tablet Place 1 tablet (0.4 mg total) under the tongue every 5 (five) minutes as needed for chest pain.   nystatin (MYCOSTATIN) 100000 UNIT/ML suspension Use as directed 5 mLs (500,000 Units total) in the mouth or throat 4 (four) times daily.   ondansetron (ZOFRAN-ODT) 4 MG disintegrating tablet Take 1 tablet (4 mg total) by mouth every 8 (eight) hours as needed for nausea or vomiting.   oxyCODONE-acetaminophen (PERCOCET) 10-325 MG tablet Take 1 tablet by mouth every 4 (four) hours as needed for pain.   potassium chloride (KLOR-CON M) 10 MEQ tablet TAKE TWO TABLETS BY MOUTH EVERY DAY   predniSONE (DELTASONE) 10 MG tablet Take 1  tablet by mouth daily.   traZODone (DESYREL) 100 MG tablet TAKE 0.5-1 TABLET BY MOUTH AT BEDTIME.   triamcinolone cream (KENALOG) 0.1 % Apply topically 2 (two) times daily as needed.     Allergies:   Enbrel [etanercept], Modafinil, Augmentin  [amoxicillin-pot clavulanate], Cefdinir, and Lisinopril   Social History   Socioeconomic History   Marital status: Married    Spouse name: Not on file   Number of children: 1   Years of education: Not on file   Highest education level: High school graduate  Occupational History   Occupation: retired  Tobacco Use   Smoking status: Every Day    Current packs/day: 1.00    Average packs/day: 1 pack/day for 49.0 years (49.0 ttl pk-yrs)    Types: Cigarettes   Smokeless tobacco: Never   Tobacco comments:  pt declines smoking cessation ( smoked since age 89)  Vaping Use   Vaping status: Never Used  Substance and Sexual Activity   Alcohol use: Not Currently    Alcohol/week: 0.0 standard drinks of alcohol   Drug use: No   Sexual activity: Yes    Birth control/protection: Surgical  Other Topics Concern   Not on file  Social History Narrative   Not on file   Social Determinants of Health   Financial Resource Strain: Low Risk  (10/07/2022)   Overall Financial Resource Strain (CARDIA)    Difficulty of Paying Living Expenses: Not hard at all  Food Insecurity: No Food Insecurity (10/07/2022)   Hunger Vital Sign    Worried About Running Out of Food in the Last Year: Never true    Ran Out of Food in the Last Year: Never true  Transportation Needs: No Transportation Needs (10/07/2022)   PRAPARE - Administrator, Civil Service (Medical): No    Lack of Transportation (Non-Medical): No  Physical Activity: Inactive (10/07/2022)   Exercise Vital Sign    Days of Exercise per Week: 0 days    Minutes of Exercise per Session: 0 min  Stress: No Stress Concern Present (10/07/2022)   [employer] of Occupational Health - Occupational Stress  Questionnaire    Feeling of Stress : Not at all  Social Connections: Moderately Integrated (10/07/2022)   Social Connection and Isolation Panel [NHANES]    Frequency of Communication with Friends and Family: More than three times a week    Frequency of Social Gatherings with Friends and Family: Three times a week    Attends Religious Services: Never    Active Member of Clubs or Organizations: Yes    Attends Banker Meetings: Never    Marital Status: Married     Family History: The patient's family history includes ADD / ADHD in her brother; Coronary artery disease in her father; Fibromyalgia in her mother and sister; Hearing loss in her mother and sister; Heart attack in her father; Hemochromatosis in her sister; Hyperlipidemia in her father, mother, and sister; Hypertension in her father, mother, and sister; Hypothyroidism in her mother; Lung cancer in her father. There is no history of Breast cancer.  ROS:   Please see the history of present illness.     All other systems reviewed and are negative.  EKGs/Labs/Other Studies Reviewed:    The following studies were reviewed today:   EKG Interpretation Date/Time:  Monday April 28 2023 11:17:53 EST Ventricular Rate:  90 PR Interval:  138 QRS Duration:  66 QT Interval:  338 QTC Calculation: 413 R Axis:   -44  Text Interpretation: Normal sinus rhythm Possible Left atrial enlargement Left axis deviation Inferior infarct , age undetermined Confirmed by Debbe Odea ([id number]) on 04/28/2023 11:25:19 AM    Recent Labs: 01/28/2023: Hemoglobin 15.1; Platelet Count 386  Recent Lipid Panel    Component Value Date/Time   CHOL 196 10/23/2022 0813   CHOL 230 (H) 08/09/2021 1402   TRIG 91 10/23/2022 0813   HDL 62 10/23/2022 0813   HDL 75 08/09/2021 1402   CHOLHDL 3.2 10/23/2022 0813   VLDL 18 10/23/2022 0813   LDLCALC 116 (H) 10/23/2022 0813   LDLCALC 134 (H) 08/09/2021 1402     Risk Assessment/Calculations:                Physical Exam:    VS:  BP 124/78 (BP Location: Left Arm, Patient Position: Sitting, Cuff Size:  Normal)   Pulse 90   Ht 4\' 11"  (1.499 m)   Wt 119 lb (54 kg)   SpO2 94%   BMI 24.04 kg/m     Wt Readings from Last 3 Encounters:  04/28/23 119 lb (54 kg)  02/04/23 119 lb (54 kg)  10/24/22 116 lb 9.6 oz (52.9 kg)     GEN:  Well nourished, well developed in no acute distress HEENT: Normal NECK: No JVD; No carotid bruits CARDIAC: RRR, no murmurs, rubs, gallops RESPIRATORY:  Clear to auscultation without rales, wheezing or rhonchi  ABDOMEN: Soft, non-tender, non-distended MUSCULOSKELETAL:  No edema; tenderness with chest palpation SKIN: Warm and dry NEUROLOGIC:  Alert and oriented x 3 PSYCHIATRIC:  Normal affect   ASSESSMENT:    1. Coronary artery disease involving native coronary artery of native heart, unspecified whether angina present   2. Primary hypertension   3. Pure hypercholesterolemia   4. Smoking   5. Abnormality of abdominal aorta   6. Cerebrovascular accident (CVA), unspecified mechanism (HCC)    PLAN:    In order of problems listed above:  Nonobstructive CAD, mild LAD, minimal RCA disease.  Musculoskeletal chest discomfort/tenderness with palpation.  Continue aspirin, Crestor.  Add nexlizet.  EF 60 to 65%.  Hypertension, BP controlled.  Continue losartan 25 mg daily, Norvasc 5 mg daily. Hyperlipidemia, not tolerant to statins.  Start Nexlizet.  Repeat lipid panel in 3 months. Current smoker, smoking cessation advised Focal outpouching of infrarenal abdominal aorta noted on CT 03/2023.  Refer to vascular surgery for additional input and monitoring. History of old cerebral infarcts, aspirin 81 mg daily, place cardiac monitor to evaluate A-fib.  Etiology likely from smoking.  Follow-up in 6 months     Medication Adjustments/Labs and Tests Ordered: Current medicines are reviewed at length with the patient today.  Concerns regarding medicines are  outlined above.  Orders Placed This Encounter  Procedures   Lipid panel   Ambulatory referral to Vascular Surgery   LONG TERM MONITOR (3-14 DAYS)   EKG 12-Lead   Meds ordered this encounter  Medications   Bempedoic Acid-Ezetimibe (NEXLIZET) 180-10 MG TABS    Sig: Take 1 tablet by mouth daily.    Dispense:  30 tablet    Refill:  3    Patient Instructions  Medication Instructions:   Restart Aspirin 81 - take one tablet ( 81mg ) daily.  2. Pick up Nexlizet - Take one tablet ( 10-180mg ) by mouth daily.  3. STOP Zetia - When you pick up Nexlizet  *If you need a refill on your cardiac medications before your next appointment, please call your pharmacy*   Lab Work:  Your provider would like for you to return in 3 months ( on or around 07/29/2023 ) to have the following labs drawn: LIPID.   Please go to [facility] [address] ([facility] [address], [place] [address] You do not need an appointment.  They are open from 7:30 am-4 pm.  Lunch from 1:00 pm- 2:00 pm You WILL need to be fasting.   If you have labs (blood work) drawn today and your tests are completely normal, you will receive your results only by: MyChart Message (if you have MyChart) OR A paper copy in the mail If you have any lab test that is abnormal or we need to change your treatment, we will call you to review the results.   Testing/Procedures:  Your physician has recommended that you wear a Zio monitor.   This  monitor is a medical device that records the heart's electrical activity. Doctors most often use these monitors to diagnose arrhythmias. Arrhythmias are problems with the speed or rhythm of the heartbeat. The monitor is a small device applied to your chest. You can wear one while you do your normal daily activities. While wearing this monitor if you have any symptoms to push the button and record what you felt. Once you have worn this monitor for the period of time provider  prescribed (Usually 14 days), you will return the monitor device in the postage paid box. Once it is returned they will download the data collected and provide [place] with a report which the provider will then review and we will call you with those results. Important tips:  Avoid showering during the first 24 hours of wearing the monitor. Avoid excessive sweating to help maximize wear time. Do not submerge the device, no hot tubs, and no swimming pools. Keep any lotions or oils away from the patch. After 24 hours you may shower with the patch on. Take brief showers with your back facing the shower head.  Do not remove patch once it has been placed because that will interrupt data and decrease adhesive wear time. Push the button when you have any symptoms and write down what you were feeling. Once you have completed wearing your monitor, remove and place into box which has postage paid and place in your outgoing mailbox.  If for some reason you have misplaced your box then call our office and we can provide another box and/or mail it off for you.   Follow-Up: At [facility], you and your health needs are our priority.  As part of our continuing mission to provide you with exceptional heart care, we have created designated Provider Care Teams.  These Care Teams include your primary Cardiologist (physician) and Advanced Practice Providers (APPs -  Physician Assistants and Nurse Practitioners) who all work together to provide you with the care you need, when you need it.  We recommend signing up for the patient portal called "MyChart".  Sign up information is provided on this After Visit Summary.  MyChart is used to connect with patients for Virtual Visits (Telemedicine).  Patients are able to view lab/test results, encounter notes, upcoming appointments, etc.  Non-urgent messages can be sent to your provider as well.   To learn more about what you can do with MyChart, go to  [email or web address].    Your next appointment:   3 month(s)  Provider:   You may see Debbe Odea, MD or one of the following Advanced Practice Providers on your designated Care Team:   Nicolasa Ducking, NP Eula Listen, PA-C Cadence Fransico Michael, PA-C Charlsie Quest, NP Carlos Levering, NP   Signed, Debbe Odea, MD  04/28/2023 12:11 PM    [facility]

## 2023-04-29 ENCOUNTER — Ambulatory Visit
Admission: RE | Admit: 2023-04-29 | Discharge: 2023-04-29 | Disposition: A | Discharge status: Home / Self Care | Payer: PPO | Source: Ambulatory Visit | Attending: Family Medicine | Admitting: Family Medicine

## 2023-04-29 DIAGNOSIS — Z122 Encounter for screening for malignant neoplasm of respiratory organs: Secondary | ICD-10-CM | POA: Insufficient documentation

## 2023-04-29 DIAGNOSIS — Z87891 Personal history of nicotine dependence: Secondary | ICD-10-CM | POA: Diagnosis not present

## 2023-04-29 DIAGNOSIS — F17210 Nicotine dependence, cigarettes, uncomplicated: Secondary | ICD-10-CM | POA: Diagnosis not present

## 2023-05-23 ENCOUNTER — Telehealth: Payer: Self-pay | Admitting: Acute Care

## 2023-05-23 NOTE — Telephone Encounter (Signed)
Results received and awaiting provider review.

## 2023-05-23 NOTE — Telephone Encounter (Signed)
STAT Read CT Lung screening from 05/20/23

## 2023-05-23 NOTE — Telephone Encounter (Signed)
I have attempted to call the patient with the results of their  Low Dose CT Chest Lung cancer screening scan. There was no answer. I have left a HIPPA compliant VM requesting the patient call the office for the scan results. I included the office contact information in the message. We will await his return call. If no return call we will continue to call until patient is contacted.    She has a new 10.5 mm nodule. She will need a PET. I will try her again next week. Thanks so much

## 2023-05-28 ENCOUNTER — Telehealth: Payer: Self-pay | Admitting: Acute Care

## 2023-05-28 DIAGNOSIS — R911 Solitary pulmonary nodule: Secondary | ICD-10-CM

## 2023-05-28 NOTE — Telephone Encounter (Signed)
I have called the patient with the results of her low-dose screening CT.  Her scan was read as a lung RADS 4B, she has a new 10.5 mm solid right lower lobe pulmonary nodule. Patient has psoriatic arthritis and was started on a biologic in February 2024.  The medication she was started on with Simponi Aria, she receives infusions every 8 weeks. This medication does suppress immune system, therefore I have a higher concern that this new pulmonary nodule may be a result of her suppressed immune system. She and I discussed this and we agree that the next best step is a PET scan. I have ordered the PET scan at [facility] in [place]. Patient will need to follow-up with either Dr. Jayme Cloud or [facility] after the scan is completed to review the results. Christina please keep an eye on when this scan gets scheduled so that we can get her scheduled with any of the docs to do navigational bronchoscopies in the [place] office within a week after the scan has been done. Again I have concerns as patient is immunosuppressed and this is a new nodule within the time that new medication was started.  Sherre Lain, and [place] please fax results to PCP and let her know the plan is for a PET scan and then follow-up with a pulmonary interventionist who can perform biopsy if PET scan indicates that is necessary.  Thank you also much  Patient is aware of this plan and in agreement.

## 2023-05-28 NOTE — Telephone Encounter (Signed)
Attempted to reach patient. LVM. Requested call back with day/time she is available to speak with provider to review results.

## 2023-05-29 ENCOUNTER — Telehealth: Payer: Self-pay | Admitting: Acute Care

## 2023-05-29 NOTE — Telephone Encounter (Signed)
See other message from 05/28/23.

## 2023-05-29 NOTE — Telephone Encounter (Signed)
I have called the patient. She is in agreement with seeing a pulmonary physician to review her lung cancer screening scan before having the PET scan. We will cancel the PET scan and have April Best schedule her sooner than 07/03/2022 for consult.  Delray Alt, can you get her in sooner than 1/17? Thanks

## 2023-05-29 NOTE — Telephone Encounter (Signed)
Spoke to patient and scheduled appt with Dr. Jayme Cloud 07/04/2023. Directions and address provided.

## 2023-05-29 NOTE — Telephone Encounter (Signed)
PET cancelled per request.

## 2023-05-29 NOTE — Telephone Encounter (Signed)
Results and plan faxed to PCP ?

## 2023-06-03 ENCOUNTER — Encounter: Payer: Self-pay | Admitting: Student in an Organized Health Care Education/Training Program

## 2023-06-03 ENCOUNTER — Ambulatory Visit: Payer: PPO | Admitting: Student in an Organized Health Care Education/Training Program

## 2023-06-03 VITALS — BP 96/60 | HR 113 | Temp 97.6°F | Ht 59.0 in | Wt 119.0 lb

## 2023-06-03 DIAGNOSIS — M069 Rheumatoid arthritis, unspecified: Secondary | ICD-10-CM | POA: Diagnosis not present

## 2023-06-03 DIAGNOSIS — J849 Interstitial pulmonary disease, unspecified: Secondary | ICD-10-CM | POA: Diagnosis not present

## 2023-06-03 DIAGNOSIS — F17200 Nicotine dependence, unspecified, uncomplicated: Secondary | ICD-10-CM

## 2023-06-03 DIAGNOSIS — F17210 Nicotine dependence, cigarettes, uncomplicated: Secondary | ICD-10-CM | POA: Diagnosis not present

## 2023-06-03 DIAGNOSIS — R911 Solitary pulmonary nodule: Secondary | ICD-10-CM | POA: Diagnosis not present

## 2023-06-03 NOTE — Progress Notes (Unsigned)
Synopsis: Referred in for lung nodule and ILD by Erasmo Downer, MD  Assessment & Plan:   #Lung nodule seen on imaging study (Primary) #Tobacco Use Disorder  Patient was noted to have a RLL nodule on LDCT for lung cancer screening. She is presenting to further discuss this, as well as findings concerning for ILD (see below). This nodule is in an area at the base of the lung that is showing the development of subpleural reticulation and honey combs. This nodule is new compared to one year ago, but there is also progression in the findings of the subpleural reticulation. While the patient is certainly at increased risk for lung cancer secondary to her smoking history, she is also at increased risk for opportunistic infections given immune suppression. There is also the possibility that this nodule represents inflammatory changes due to ILD.  I will closely monitor the nodule with consideration for biopsy or resection if it persists and/or increases in size. 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 We will be getting a high resolution chest CT to assess for ILD at the 3 month mark, with plan to monitor the nodule with the CT. Will consider PET/CT if there is change in the size or nature of the nodule   Patient counseled at length regarding smoking cessation. Information regarding cessation aids provided.  - CT CHEST HIGH RESOLUTION; Future  #CTD-ILD #Rheumatoid arthritis  Noted to have findings concerning for interstitial lung disease on her LDCT performed for lung cancer screening. I am highly concerned for CTD-ILD, with a differential of RA vs mixed connective tissue disease. Other differentials include NSIP and DIP. Patient is currently minimally symptomatic from a pulmonary perspective. I am going to obtain a high resolution chest CT for further evaluate the findings (with expiratory and prone films),  pulmonary function testing (spirometry, lung volumes, DLCO) and an auto-immune panel. She's previously had a positive ANA (1:1280) and anti-CCP. I will repeat the ANA panel today and also obtain a myomarker panel. Will discuss her case with her rheumatologist as well as in our multi-disciplinary ILD conference.  - CT CHEST HIGH RESOLUTION; Future - Pulmonary Function Test [facility] Only; Future - MyoMarker 3 Plus Profile (RDL) - ANA 12 Plus Profile (RDL)   Return in about 2 months (around 08/16/2023).  I spent 65 minutes caring for this patient today, including preparing to see the patient, obtaining a medical history , reviewing a separately obtained history, performing a medically appropriate examination and/or evaluation, counseling and educating the patient/family/caregiver, ordering medications, tests, or procedures, documenting clinical information in the electronic health record, and independently interpreting results (not separately reported/billed) and communicating results to the patient/family/caregiver. 4 minutes spent counseling patient regarding smoking cessation.  [name], MD [facility]  End of visit medications:  No orders of the defined types were placed in this encounter.    Current Outpatient Medications:    amLODipine (NORVASC) 5 MG tablet, TAKE 1 TABLET BY MOUTH DAILY, Disp: 90 tablet, Rfl: 1   azelastine (ASTELIN) 0.1 % nasal spray, Place 1 spray into both nostrils daily as needed., Disp: , Rfl:    Bempedoic Acid-Ezetimibe (NEXLIZET) 180-10 MG TABS, Take 1 tablet by mouth daily., Disp: 30 tablet, Rfl: 3   clobetasol ointment (TEMOVATE) 0.05 %, APP TOPICALLY TO GLUTEAL AREA ONCE D PRF DERMATITIS, Disp: 30 g, Rfl: 1   diclofenac Sodium (VOLTAREN) 1 %  GEL, Apply 2 g topically 4 (four) times daily., Disp: , Rfl:    DULoxetine (CYMBALTA) 20 MG capsule, Cymbalta 20 mg capsule,delayed release  Take 1 capsule twice a day by oral route., Disp: , Rfl:     escitalopram (LEXAPRO) 20 MG tablet, TAKE ONE TABLET BY MOUTH EVERY DAY, Disp: 90 tablet, Rfl: 1   fluticasone (FLONASE) 50 MCG/ACT nasal spray, Place 2 sprays into both nostrils daily., Disp: , Rfl:    gabapentin (NEURONTIN) 100 MG capsule, Take 100 mg by mouth 2 (two) times daily as needed., Disp: , Rfl:    gabapentin (NEURONTIN) 300 MG capsule, Take 300 mg by mouth as needed (qhs). PRN, Disp: , Rfl:    gentamicin ointment (GARAMYCIN) 0.1 %, Apply 1 application  topically 3 (three) times daily., Disp: , Rfl:    Golimumab (SIMPONI ARIA IV), Inject into the vein every 8 (eight) weeks. Infusion for RA., Disp: , Rfl:    loratadine (CLARITIN) 10 MG tablet, Take 10 mg by mouth daily., Disp: , Rfl:    losartan (COZAAR) 25 MG tablet, TAKE 1 TABLET BY MOUTH DAILY, Disp: 90 tablet, Rfl: 1   methocarbamol (ROBAXIN) 750 MG tablet, methocarbamol 750 mg tablet  Take 1 tablet 3 times a day by oral route for 30 days., Disp: , Rfl:    nitroGLYCERIN (NITROSTAT) 0.4 MG SL tablet, Place 1 tablet (0.4 mg total) under the tongue every 5 (five) minutes as needed for chest pain., Disp: 30 tablet, Rfl: 12   ondansetron (ZOFRAN-ODT) 4 MG disintegrating tablet, Take 1 tablet (4 mg total) by mouth every 8 (eight) hours as needed for nausea or vomiting., Disp: 20 tablet, Rfl: 0   oxyCODONE-acetaminophen (PERCOCET) 10-325 MG tablet, Take 1 tablet by mouth every 4 (four) hours as needed for pain., Disp: , Rfl:    potassium chloride (KLOR-CON M) 10 MEQ tablet, TAKE TWO TABLETS BY MOUTH EVERY DAY, Disp: 30 tablet, Rfl: 0   predniSONE (DELTASONE) 10 MG tablet, Take 1 tablet by mouth daily., Disp: , Rfl:    traZODone (DESYREL) 100 MG tablet, TAKE 0.5-1 TABLET BY MOUTH AT BEDTIME., Disp: 30 tablet, Rfl: 5   triamcinolone cream (KENALOG) 0.1 %, Apply topically 2 (two) times daily as needed., Disp: , Rfl:    Budeson-Glycopyrrol-Formoterol (BREZTRI AEROSPHERE) 160-9-4.8 MCG/ACT AERO, Inhale 2 puffs into the lungs 2 (two) times daily.  (Patient not taking: Reported on 06/03/2023), Disp: 10.7 g, Rfl: 11   Golimumab (SIMPONI ARIA IV), Inject into the vein every 8 (eight) weeks., Disp: , Rfl:    Subjective:   PATIENT ID: April Best GENDER: female DOB: [birth year], MRN: [id number]  Chief Complaint  Patient presents with   Consult    No shortness of breath or wheezing. Occasional cough with brown phlegm.     HPI  April Best is a pleasant 72 year old female with a past medical history of psoriatic arthritis followed by Dr. Allena Katz from rheumatology who presents to clinic for the evaluation of a pulmonary nodule.  Patient has a history of Psoriatic arthritis followed by Dr. Allena Katz from [facility]. She's currently maintained on Simponi Aria (Golimumab) and has tolerated her infusions without recent flares. She is also maintained on low dose prednisone at 10 mg daily for joint aches. She was previously on methotrexate, and etanercept. She has positive anti-CCP as well as a very positive ANA. She reports multiple joint pains and joint aches. She also has a history of CAD followed by cardiology  Patient is in  her usual state of health from a respiratory perspective. She has minimal symptoms with an occasional cough but otherwise no dyspnea or shortness of breath with exertion. She has no chest pain. She denies sputum production.  Patient has a long standing history of smoking, and currently continue to smoke. Given this, she was enrolled in our lung cancer screening program. A LDCT was performed on 04/29/2023 showing findings concerning for interstitial lung disease as well as a right lower lobe nodule. Patient is referred to [place] for further discussion.  She has a history of smoking, currently smoking 1 pack a day. She has around 55 pack years of smoking history. Denies occupational exposures.  Ancillary information including prior medications, full medical/surgical/family/social histories, and PFTs (when available) are listed  below and have been reviewed.   Review of Systems  Constitutional:  Negative for chills, fever and weight loss.  Respiratory:  Positive for shortness of breath (mild). Negative for cough, hemoptysis, sputum production and wheezing.   Cardiovascular:  Negative for chest pain and palpitations.     Objective:   Vitals:   06/03/23 1426  BP: 96/60  Pulse: (!) 113  Temp: 97.6 F (36.4 C)  TempSrc: Temporal  SpO2: 95%  Weight: 119 lb (54 kg)  Height: 4\' 11"  (1.499 m)   95% on RA BMI Readings from Last 3 Encounters:  06/03/23 24.04 kg/m  04/28/23 24.04 kg/m  02/04/23 24.04 kg/m   Wt Readings from Last 3 Encounters:  06/03/23 119 lb (54 kg)  04/28/23 119 lb (54 kg)  02/04/23 119 lb (54 kg)    Physical Exam Constitutional:      Appearance: Normal appearance.  Cardiovascular:     Rate and Rhythm: Normal rate and regular rhythm.     Pulses: Normal pulses.     Heart sounds: Normal heart sounds.  Pulmonary:     Effort: Pulmonary effort is normal.     Breath sounds: Rales (bibasilar) present.  Neurological:     General: No focal deficit present.     Mental Status: She is alert and oriented to person, place, and time. Mental status is at baseline.       Ancillary Information    Past Medical History:  Diagnosis Date   Allergic rhinitis    Allergy    Annular oral lichen planus    Anxiety    Arthritis    Depression    Deviated nasal septum    nasal obstruction   Guaiac positive stools    history   H. 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   History of palpitations    Hyperlipidemia    Hypertension    Migraine    Osteoarthritis    Osteoporosis    Panic disorder    Psoriasis    Psoriatic arthritis (HCC)    Rheumatoid arteritis (HCC)    Tinnitus      Family History  Problem Relation Age of Onset   Hypertension Mother    Hyperlipidemia Mother    Hypothyroidism Mother    Fibromyalgia Mother    Hearing loss  Mother    Lung cancer Father    Coronary artery disease Father    Hyperlipidemia Father    Hypertension Father    Heart attack Father    Hearing loss Sister    Hyperlipidemia Sister    Hypertension Sister    Fibromyalgia Sister    Hemochromatosis Sister    ADD / ADHD Brother  Breast cancer Neg Hx      Past Surgical History:  Procedure Laterality Date   ABDOMINAL HYSTERECTOMY  1983   Ovaries have been removed.   ABDOMINOPLASTY     APPENDECTOMY     BREAST BIOPSY Right    neg- core   COLONOSCOPY  03/2006   COLONOSCOPY WITH PROPOFOL N/A 11/19/2017   Procedure: COLONOSCOPY WITH PROPOFOL;  Surgeon: Toledo, Boykin Nearing, MD;  Location: [facility] ENDOSCOPY;  Service: Gastroenterology;  Laterality: N/A;   CORONARY/GRAFT ACUTE MI REVASCULARIZATION N/A 02/24/2022   Procedure: Coronary/Graft Acute MI Revascularization;  Surgeon: Iran Ouch, MD;  Location: [facility] INVASIVE CV LAB;  Service: Cardiovascular;  Laterality: N/A;   ENDOSCOPIC CONCHA BULLOSA RESECTION Right 01/30/2021   Procedure: ENDOSCOPIC CONCHA BULLOSA RESECTION;  Surgeon: Geanie Logan, MD;  Location: [facility];  Service: ENT;  Laterality: Right;   ESOPHAGOGASTRODUODENOSCOPY N/A 03/19/2022   Procedure: ESOPHAGOGASTRODUODENOSCOPY (EGD);  Surgeon: Regis Bill, MD;  Location: [facility] END[facility]COPY;  Service: Gastroenterology;  Laterality: N/A;   LEFT HEART CATH AND CORONARY ANGIOGRAPHY N/A 02/24/2022   Procedure: LEFT HEART CATH AND CORONARY ANGIOGRAPHY;  Surgeon: Iran Ouch, MD;  Location: [facility] INVASIVE CV LAB;  Service: Cardiovascular;  Laterality: N/A;   RHINOPLASTY  2002   SEPTOPLASTY N/A 01/30/2021   Procedure: SEPTOPLASTY;  Surgeon: Geanie Logan, MD;  Location: [facility];  Service: ENT;  Laterality: N/A;   UPPER GI ENDOSCOPY  2008    Social History   Socioeconomic History   Marital status: Married    Spouse name: Not on file   Number of children: 1   Years of education: Not on file   Highest  education level: High school graduate  Occupational History   Occupation: retired  Tobacco Use   Smoking status: Every Day    Current packs/day: 1.00    Average packs/day: 1 pack/day for 54.0 years (54.0 ttl pk-yrs)    Types: Cigarettes    Start date: 53   Smokeless tobacco: Never  Vaping Use   Vaping status: Never Used  Substance and Sexual Activity   Alcohol use: Not Currently    Alcohol/week: 0.0 standard drinks of alcohol   Drug use: No   Sexual activity: Yes    Birth control/protection: Surgical  Other Topics Concern   Not on file  Social History Narrative   Not on file   Social Drivers of Health   Financial Resource Strain: Low Risk  (10/07/2022)   Overall Financial Resource Strain (CARDIA)    Difficulty of Paying Living Expenses: Not hard at all  Food Insecurity: No Food Insecurity (10/07/2022)   Hunger Vital Sign    Worried About Running Out of Food in the Last Year: Never true    Ran Out of Food in the Last Year: Never true  Transportation Needs: No Transportation Needs (10/07/2022)   PRAPARE - Administrator, Civil Service (Medical): No    Lack of Transportation (Non-Medical): No  Physical Activity: Inactive (10/07/2022)   Exercise Vital Sign    Days of Exercise per Week: 0 days    Minutes of Exercise per Session: 0 min  Stress: No Stress Concern Present (10/07/2022)   [employer] of Occupational Health - Occupational Stress Questionnaire    Feeling of Stress : Not at all  Social Connections: Moderately Integrated (10/07/2022)   Social Connection and Isolation Panel [NHANES]    Frequency of Communication with Friends and Family: More than three times a week    Frequency of  Social Gatherings with Friends and Family: Three times a week    Attends Religious Services: Never    Active Member of Clubs or Organizations: Yes    Attends Banker Meetings: Never    Marital Status: Married  Catering manager Violence: Patient Declined  (02/24/2022)   Humiliation, Afraid, Rape, and Kick questionnaire    Fear of Current or Ex-Partner: Patient declined    Emotionally Abused: Patient declined    Physically Abused: Patient declined    Sexually Abused: Patient declined     Allergies  Allergen Reactions   Enbrel [Etanercept] Hives    Other reaction(s): Muscle Cramps   Modafinil Rash   Augmentin  [Amoxicillin-Pot Clavulanate] Nausea And Vomiting    as stated (XR).   Cefdinir Diarrhea and Other (See Comments)    Other reaction(s): GI intolerance, Other (see comments) Gives her Cdiff Gives her Cdiff    Lisinopril Cough     CBC    Component Value Date/Time   WBC 21.5 (H) 01/28/2023 1328   WBC 11.0 (H) 07/31/2022 1112   RBC 4.69 01/28/2023 1328   HGB 15.1 (H) 01/28/2023 1328   HGB 13.9 04/23/2021 0917   HCT 46.7 (H) 01/28/2023 1328   HCT 42.3 04/23/2021 0917   PLT 386 01/28/2023 1328   PLT 599 (H) 04/23/2021 0917   MCV 99.6 01/28/2023 1328   MCV 96 04/23/2021 0917   MCV 98 10/18/2013 1529   MCH 32.2 01/28/2023 1328   MCHC 32.3 01/28/2023 1328   RDW 13.6 01/28/2023 1328   RDW 11.3 (L) 04/23/2021 0917   RDW 12.3 10/18/2013 1529   LYMPHSABS 1.6 01/28/2023 1328   LYMPHSABS 3.2 (H) 04/23/2021 0917   LYMPHSABS 2.8 10/18/2013 1529   MONOABS 1.1 (H) 01/28/2023 1328   MONOABS 0.7 10/18/2013 1529   EOSABS 0.0 01/28/2023 1328   EOSABS 0.7 (H) 04/23/2021 0917   EOSABS 0.2 10/18/2013 1529   BASOSABS 0.1 01/28/2023 1328   BASOSABS 0.0 04/23/2021 0917   BASOSABS 0.2 (H) 10/18/2013 1529    Pulmonary Functions Testing Results:     No data to display          Outpatient Medications Prior to Visit  Medication Sig Dispense Refill   amLODipine (NORVASC) 5 MG tablet TAKE 1 TABLET BY MOUTH DAILY 90 tablet 1   azelastine (ASTELIN) 0.1 % nasal spray Place 1 spray into both nostrils daily as needed.     Bempedoic Acid-Ezetimibe (NEXLIZET) 180-10 MG TABS Take 1 tablet by mouth daily. 30 tablet 3   clobetasol ointment  (TEMOVATE) 0.05 % APP TOPICALLY TO GLUTEAL AREA ONCE D PRF DERMATITIS 30 g 1   diclofenac Sodium (VOLTAREN) 1 % GEL Apply 2 g topically 4 (four) times daily.     DULoxetine (CYMBALTA) 20 MG capsule Cymbalta 20 mg capsule,delayed release  Take 1 capsule twice a day by oral route.     escitalopram (LEXAPRO) 20 MG tablet TAKE ONE TABLET BY MOUTH EVERY DAY 90 tablet 1   fluticasone (FLONASE) 50 MCG/ACT nasal spray Place 2 sprays into both nostrils daily.     gabapentin (NEURONTIN) 100 MG capsule Take 100 mg by mouth 2 (two) times daily as needed.     gabapentin (NEURONTIN) 300 MG capsule Take 300 mg by mouth as needed (qhs). PRN     gentamicin ointment (GARAMYCIN) 0.1 % Apply 1 application  topically 3 (three) times daily.     Golimumab (SIMPONI ARIA IV) Inject into the vein every 8 (eight) weeks. Infusion for  RA.     loratadine (CLARITIN) 10 MG tablet Take 10 mg by mouth daily.     losartan (COZAAR) 25 MG tablet TAKE 1 TABLET BY MOUTH DAILY 90 tablet 1   methocarbamol (ROBAXIN) 750 MG tablet methocarbamol 750 mg tablet  Take 1 tablet 3 times a day by oral route for 30 days.     nitroGLYCERIN (NITROSTAT) 0.4 MG SL tablet Place 1 tablet (0.4 mg total) under the tongue every 5 (five) minutes as needed for chest pain. 30 tablet 12   ondansetron (ZOFRAN-ODT) 4 MG disintegrating tablet Take 1 tablet (4 mg total) by mouth every 8 (eight) hours as needed for nausea or vomiting. 20 tablet 0   oxyCODONE-acetaminophen (PERCOCET) 10-325 MG tablet Take 1 tablet by mouth every 4 (four) hours as needed for pain.     potassium chloride (KLOR-CON M) 10 MEQ tablet TAKE TWO TABLETS BY MOUTH EVERY DAY 30 tablet 0   predniSONE (DELTASONE) 10 MG tablet Take 1 tablet by mouth daily.     traZODone (DESYREL) 100 MG tablet TAKE 0.5-1 TABLET BY MOUTH AT BEDTIME. 30 tablet 5   triamcinolone cream (KENALOG) 0.1 % Apply topically 2 (two) times daily as needed.     Budeson-Glycopyrrol-Formoterol (BREZTRI AEROSPHERE) 160-9-4.8  MCG/ACT AERO Inhale 2 puffs into the lungs 2 (two) times daily. (Patient not taking: Reported on 06/03/2023) 10.7 g 11   Golimumab (SIMPONI ARIA IV) Inject into the vein every 8 (eight) weeks.     COLLAGEN PO Take 30 mLs by mouth. (Patient not taking: Reported on 06/03/2023)     ezetimibe (ZETIA) 10 MG tablet Take 1 tablet (10 mg total) by mouth daily. 30 tablet 3   gabapentin (NEURONTIN) 100 MG capsule Take 1 capsule by mouth every morning.     hydrocortisone 2.5 % ointment Apply topically 2 (two) times daily. (Patient not taking: Reported on 06/03/2023)     nystatin (MYCOSTATIN) 100000 UNIT/ML suspension Use as directed 5 mLs (500,000 Units total) in the mouth or throat 4 (four) times daily. 473 mL 0   No facility-administered medications prior to visit.

## 2023-06-03 NOTE — Patient Instructions (Addendum)
Repeat CT, mid February 2025 Pulmonary function testing Will repeat ANA panel Will discuss in multi-disciplinary ILD conference Will discuss with Dr. Allena Katz Might need to increase/add immune suppression vs watchful waiting Please stop smoking  Today, I ordered blood work. You can get them draw at your preferred [facility] draw station. The nearest one to [facility] is at nearby [facility] ([address] [place], [place], [place] [address]).  ---------------------------------    [employer]: Call 1-800-QUIT-NOW (872-452-1396). The [employer] [facility]uit[facility] is a free service for [employer]. Trained counselors are available from 8 am until 3 am, 365 days per year. Services are available in both [place] and [place].   Web Resources Free online support programs can help you track your progress and share experiences with others who are quitting. These are examples: www.becomeanex.org www.trytostop.org  www.smokefree.gov  www.https://www.vargas.com/.aspx  [facility]: offers comprehensive in-person tobacco treatment counseling at [facility] building ([address] [place] [address]).  Open to everyone. Virtual appointments available. Free parking. Call [phone] to schedule an appointment or [phone] for general information.    Tobacco Cessation Medications  Nicotine Replacement Therapy (NRT)  Nicotine is the addictive part of tobacco smoke, but not the most dangerous part. There are 7000 other toxins in cigarettes, including carbon monoxide, that cause disease. People do not generally become addicted to medication. Common problems: People don't use enough medication or stop too early. Medications are safe and effective. Overdose is very uncommon. Use medications as long as needed (3 months minimum). Some combinations work better than single medications. Long acting medications like the NRT patch and bupropion provide continuous  treatment for withdrawal symptoms.  PLUS  Short acting medications like the NRT gum, lozenge, inhaler, and nasal spray help people to cope with breakthrough cravings.  ? Nicotine Patch  Place patch on hairless skin on upper body, including arms and back. Each day: discard old patch, shower, apply new patch to a different site. Apply hydrocortisone cream to mildly red/irritated areas. Call provider if rash develops. If patch causes sleep disturbance, remove patch at bedtime and replace each morning after shower. Side effects may include: skin irritation, headache, insomnia, abnormal/vivid dreams.  ? Nicotine Gum  Chew gum slowly, park in cheek when peppery taste or tingling sensation begins (about 15-30 chews). When taste or tingling goes away, begin chewing again. Use until nicotine is gone (taste or tingle does not return, usually 30 minutes). Park in different areas of mouth. Nicotine is absorbed through the lining of the mouth. Use enough to control cravings, up to 24 pieces per day (if used alone). Avoid eating or drinking for 15 minutes before using and during use. Side effects may include: mouth/jaw soreness, hiccups, indigestion, hypersalivation.  If gum is not chewed correctly, additional side effects may include lightheadedness, nausea/vomiting, throat and mouth irritation.  ? Nicotine Lozenge  Allow to dissolve slowly in mouth (20-30 minutes). Do not chew or swallow. Nicotine release may cause a warm tingling sensation. Occasionally rotate to different areas of the mouth. Use enough to control cravings, up to 20 lozenges per day (if used alone). Avoid eating or drinking for 15 minutes before using and during use. Side effects may include: nausea, hiccups, cough, heartburn, headache, gas, insomnia.  ? Nicotine Nasal Spray Use 1 spray in each nostril (1 dose) and tilt head back for 1 minute. Do not sniff, swallow, or inhale through nose.  Use at least 8 doses (1 spray in each  nostril) , up  to 40 doses per day (if used alone). To reduce nasal irritation, spray on cotton swab and insert into nose. Side effects may include: nasal and/or throat irritation (hot, peppery, or burning sensation), nasal irritation, tearing, sneezing, cough, headache.  ? Nicotine Oral Inhaler (puffer) Inhale into the back of the throat or puff in short breaths. Do not inhale into the lungs.  Puff continuously for 20 minutes (about 80 puffs) until cartridge is empty. Change cartridge when it loses the "burning in throat" sensation (feels like air only). Open cartridges can be saved and used again within 24 hours. Use at least 6 and up to 16 cartridges per day (if used alone).  Avoid eating or drinking for 15 minutes before using and during use. Side effects may include: mouth and/or throat irritation, unpleasant taste, cough, nasal irritation, indigestion, hiccups, headache.  ? Chantix (varenicline) Days 1-3: Take one 0.5 mg white pill each morning for 3 days, one week before quit date. Days 4-7: Increase to one 0.5 mg white pill twice a day in morning and evening for 4 days.  On Day 8 (target quit date), increase to one 1 mg blue pill twice a day. Maintain this dose for a minimum of 3 months. Take with food and a full glass of water to reduce nausea. Be sure that the two doses are at least 8 hours apart, but try to take second dose early in the evening (i.e. 6 pm) to avoid sleep problems. Common side effects include: nausea, insomnia, headache, abnormal/vivid dreams. Tell your doctor if you have any history of psychiatric illness prior to starting Chantix.  STOP taking CHANTIX and contact a healthcare provider immediately if you experience agitation, hostility, depressed mood, changes in thoughts or behavior that are not typical for you, thinking about or attempting suicide, allergic or skin reactions including swelling, rash, redness, or peeling of the skin.  For patients who have heart  disease: Smoking is a major risk factor for cardiovascular disease, and Chantix can help you quit smoking. Chantix may be associated with a small, increased risk of certain heart events in patients who have heart disease. If you have any new or worsening symptoms of heart disease while taking Chantix, such as shortness of breath or trouble breathing, new or worsening chest pain, or new or worsening pain in your legs when walking, call your doctor or get emergency medical help immediately.  ? Wellbutrin / Zyban (bupropion) Take one 150 mg pill each morning for 3 days, one week before target quit date. On Day 4, increase to one 150 mg pill twice a day, morning and evening.  Maintain this dose for a minimum of 3 months. Be sure that the two doses are at least 8 hours apart, but try to take second dose early in the evening (i.e. 6 pm) to avoid sleep problems. Avoid or minimize use of alcohol when taking this medication. Common side effects include: dry mouth, headache, insomnia, nausea, weight loss.  Risk of seizure is 06/998. STOP taking BUPROPION and contact a healthcare provider immediately if you experience agitation, hostility, depressed mood, changes in thoughts or behavior that are not typical for you, thinking about or attempting suicide, allergic or skin reactions including swelling, rash, redness, or peeling of the skin.

## 2023-06-04 ENCOUNTER — Other Ambulatory Visit (HOSPITAL_COMMUNITY): Payer: Self-pay

## 2023-06-04 ENCOUNTER — Ambulatory Visit: Payer: PPO

## 2023-06-04 ENCOUNTER — Telehealth: Payer: Self-pay | Admitting: Pharmacy Technician

## 2023-06-04 DIAGNOSIS — M810 Age-related osteoporosis without current pathological fracture: Secondary | ICD-10-CM | POA: Diagnosis not present

## 2023-06-04 DIAGNOSIS — M5481 Occipital neuralgia: Secondary | ICD-10-CM | POA: Diagnosis not present

## 2023-06-04 DIAGNOSIS — Z79899 Other long term (current) drug therapy: Secondary | ICD-10-CM | POA: Diagnosis not present

## 2023-06-04 DIAGNOSIS — M25511 Pain in right shoulder: Secondary | ICD-10-CM | POA: Diagnosis not present

## 2023-06-04 DIAGNOSIS — G894 Chronic pain syndrome: Secondary | ICD-10-CM | POA: Diagnosis not present

## 2023-06-04 DIAGNOSIS — M5416 Radiculopathy, lumbar region: Secondary | ICD-10-CM | POA: Diagnosis not present

## 2023-06-04 DIAGNOSIS — M5412 Radiculopathy, cervical region: Secondary | ICD-10-CM | POA: Diagnosis not present

## 2023-06-04 DIAGNOSIS — M8438XA Stress fracture, other site, initial encounter for fracture: Secondary | ICD-10-CM | POA: Diagnosis not present

## 2023-06-04 DIAGNOSIS — R110 Nausea: Secondary | ICD-10-CM | POA: Diagnosis not present

## 2023-06-04 NOTE — Telephone Encounter (Signed)
Pharmacy Patient Advocate Encounter   Received notification from CoverMyMeds that prior authorization for nexlizet is required/requested.   Insurance verification completed.   The patient is insured through [facility] ADVANTAGE/RX ADVANCE .   Per test claim: PA required; PA submitted to above mentioned insurance via CoverMyMeds Key/confirmation #/EOC [id number] Status is pending

## 2023-06-05 NOTE — Telephone Encounter (Signed)
Pharmacy Patient Advocate Encounter  Received notification from [facility] ADVANTAGE/RX ADVANCE that Prior Authorization for nexlizet has been APPROVED from 06/04/23 to 12/03/23   PA #/Case ID/Reference #: [id number]

## 2023-06-06 ENCOUNTER — Telehealth: Payer: Self-pay

## 2023-06-06 DIAGNOSIS — R911 Solitary pulmonary nodule: Secondary | ICD-10-CM

## 2023-06-06 NOTE — Telephone Encounter (Signed)
-----   Message from [place] Dgayli sent at 06/05/2023  6:12 PM EST ----- Regarding: ILD questionnaire Hi team - can we please contact April Best and provide her with an ILD questionnaire to fill out and bring back to [place]. This will be helpful for the ILD board meeting, as well as for the follow up visit. Also please check in if she got her blood work done at [facility] when you call her.  Thank you very much [employer]

## 2023-06-06 NOTE — Telephone Encounter (Signed)
She did have the blood work done. I will send the packet.

## 2023-06-06 NOTE — Telephone Encounter (Signed)
Lm x1 for patient.  

## 2023-06-13 NOTE — Telephone Encounter (Signed)
Spoke to patient. She stated that she has not received ILD questionnaire.I have mailed Questionnaire to address on file.  Will hold message to ensure f/u.

## 2023-06-16 DIAGNOSIS — L4050 Arthropathic psoriasis, unspecified: Secondary | ICD-10-CM | POA: Diagnosis not present

## 2023-06-21 ENCOUNTER — Other Ambulatory Visit: Payer: Self-pay | Admitting: Family Medicine

## 2023-06-21 DIAGNOSIS — E876 Hypokalemia: Secondary | ICD-10-CM

## 2023-06-23 DIAGNOSIS — M797 Fibromyalgia: Secondary | ICD-10-CM | POA: Diagnosis not present

## 2023-06-23 DIAGNOSIS — R768 Other specified abnormal immunological findings in serum: Secondary | ICD-10-CM | POA: Diagnosis not present

## 2023-06-23 DIAGNOSIS — M539 Dorsopathy, unspecified: Secondary | ICD-10-CM | POA: Diagnosis not present

## 2023-06-23 DIAGNOSIS — L409 Psoriasis, unspecified: Secondary | ICD-10-CM | POA: Diagnosis not present

## 2023-06-23 DIAGNOSIS — Z7960 Long term (current) use of unspecified immunomodulators and immunosuppressants: Secondary | ICD-10-CM | POA: Diagnosis not present

## 2023-06-23 DIAGNOSIS — L4050 Arthropathic psoriasis, unspecified: Secondary | ICD-10-CM | POA: Diagnosis not present

## 2023-06-24 NOTE — Telephone Encounter (Signed)
 I spoke with the patient. She did receive the forms to fill out. She will fill them out and bring them to her appt on 3/12.  Also, she said she saw her Rheumatologist and he suggested that she go ahead and have the PET scan done now. She said she is uncomfortable waiting 3 months to have another CT. She would like to go ahead with the PET scan.  Dr. Isadora please advise.

## 2023-06-24 NOTE — Telephone Encounter (Signed)
 Lm for update.  

## 2023-06-24 NOTE — Telephone Encounter (Deleted)
 I spoke with the patient. She did receive the forms to fill out. She will fill them out and bring them to her appt on 3/12.  Also, she said she saw her Rheumatologist and he suggested that she go ahead and have the PET scan done now. She said she is uncomfortable waiting 3 months to have another CT. She would like to go ahead with the PET scan.  Dr. Isadora please advise.

## 2023-06-24 NOTE — Telephone Encounter (Signed)
 Requested medication (s) are due for refill today: yes  Requested medication (s) are on the active medication list: yes  Last refill:  03/25/23  Future visit scheduled: yes  Notes to clinic:  Unable to refill per protocol due to failed labs, no updated results.      Requested Prescriptions  Pending Prescriptions Disp Refills   potassium chloride  (KLOR-CON  M) 10 MEQ tablet [Pharmacy Med Name: POTASSIUM CHLORIDE  CRYS ER 10 MEQ T] 30 tablet 0    Sig: TAKE TWO TABLETS BY MOUTH EVERY DAY     Endocrinology:  Minerals - Potassium Supplementation Failed - 06/24/2023  1:49 PM      Failed - K in normal range and within 360 days    Potassium  Date Value Ref Range Status  02/24/2022 3.5 3.5 - 5.1 mmol/L Final         Failed - Cr in normal range and within 360 days    Creat  Date Value Ref Range Status  05/05/2017 0.57 0.50 - 0.99 mg/dL Final    Comment:    For patients >81 years of age, the reference limit for Creatinine is approximately 13% higher for people identified as African-American. .    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

## 2023-06-25 DIAGNOSIS — M5416 Radiculopathy, lumbar region: Secondary | ICD-10-CM | POA: Diagnosis not present

## 2023-06-26 NOTE — Telephone Encounter (Signed)
 PET has been scheduled for 1/15 and follow up appt on 1/23. The patient is aware.  Nothing further needed.

## 2023-07-02 ENCOUNTER — Ambulatory Visit
Admission: RE | Admit: 2023-07-02 | Discharge: 2023-07-02 | Disposition: A | Discharge status: Home / Self Care | Payer: PPO | Source: Ambulatory Visit | Attending: Student in an Organized Health Care Education/Training Program | Admitting: Student in an Organized Health Care Education/Training Program

## 2023-07-02 DIAGNOSIS — K5730 Diverticulosis of large intestine without perforation or abscess without bleeding: Secondary | ICD-10-CM | POA: Diagnosis not present

## 2023-07-02 DIAGNOSIS — Q676 Pectus excavatum: Secondary | ICD-10-CM | POA: Diagnosis not present

## 2023-07-02 DIAGNOSIS — M4316 Spondylolisthesis, lumbar region: Secondary | ICD-10-CM | POA: Insufficient documentation

## 2023-07-02 DIAGNOSIS — R911 Solitary pulmonary nodule: Secondary | ICD-10-CM | POA: Insufficient documentation

## 2023-07-02 DIAGNOSIS — J439 Emphysema, unspecified: Secondary | ICD-10-CM | POA: Diagnosis not present

## 2023-07-02 DIAGNOSIS — I700 Atherosclerosis of aorta: Secondary | ICD-10-CM | POA: Insufficient documentation

## 2023-07-02 LAB — GLUCOSE, CAPILLARY: Glucose-Capillary: 105 mg/dL — ABNORMAL HIGH (ref 70–99)

## 2023-07-02 MED ORDER — FLUDEOXYGLUCOSE F - 18 (FDG) INJECTION
6.2000 | Freq: Once | INTRAVENOUS | Status: AC | PRN
  Administered 2023-07-02: 6.19 via INTRAVENOUS

## 2023-07-04 ENCOUNTER — Ambulatory Visit: Payer: PPO | Admitting: Pulmonary Disease

## 2023-07-08 LAB — MYOMARKER 3 PLUS PROFILE (RDL)
Anti-Jo-1 Ab (RDL): <20 U (ref <20)
Anti-MDA-5 Ab (CADM-140)(RDL): 21 U — ABNORMAL HIGH (ref <20)
Anti-NXP-2 (P140) Ab (RDL): <20 U (ref <20)
Anti-PM/Scl-100 Ab (RDL): <20 U (ref <20)
Anti-SAE1 Ab, IgG (RDL): <20 U (ref <20)
Anti-SS-A 52kD Ab, IgG (RDL): <20 U (ref <20)
Anti-TIF-1gamma Ab (RDL): <20 U (ref <20)
Anti-U1 RNP Ab (RDL): <20 U (ref <20)

## 2023-07-08 LAB — ANTI-RO/NEG ANA: Anti-Ro (SS-A) Ab (RDL): <20 U (ref <20)

## 2023-07-08 LAB — ANA 12 PLUS PROFILE (RDL): Anti-Nuclear Ab by IFA (RDL): NEGATIVE

## 2023-07-10 ENCOUNTER — Ambulatory Visit (INDEPENDENT_AMBULATORY_CARE_PROVIDER_SITE_OTHER): Payer: PPO | Admitting: Student in an Organized Health Care Education/Training Program

## 2023-07-10 ENCOUNTER — Encounter: Payer: Self-pay | Admitting: Student in an Organized Health Care Education/Training Program

## 2023-07-10 VITALS — BP 144/76 | HR 62 | Temp 97.8°F | Ht 59.0 in | Wt 117.0 lb

## 2023-07-10 DIAGNOSIS — J849 Interstitial pulmonary disease, unspecified: Secondary | ICD-10-CM

## 2023-07-12 NOTE — Progress Notes (Signed)
Encounter cancelled, no service provided.  [name], MD [facility]

## 2023-07-15 ENCOUNTER — Ambulatory Visit
Admission: RE | Admit: 2023-07-15 | Discharge: 2023-07-15 | Disposition: A | Discharge status: Home / Self Care | Payer: PPO | Source: Ambulatory Visit | Attending: Family Medicine | Admitting: Family Medicine

## 2023-07-15 DIAGNOSIS — M069 Rheumatoid arthritis, unspecified: Secondary | ICD-10-CM | POA: Insufficient documentation

## 2023-07-15 DIAGNOSIS — R911 Solitary pulmonary nodule: Secondary | ICD-10-CM | POA: Diagnosis not present

## 2023-07-15 DIAGNOSIS — F17210 Nicotine dependence, cigarettes, uncomplicated: Secondary | ICD-10-CM | POA: Insufficient documentation

## 2023-07-15 DIAGNOSIS — J432 Centrilobular emphysema: Secondary | ICD-10-CM | POA: Diagnosis not present

## 2023-07-15 DIAGNOSIS — R918 Other nonspecific abnormal finding of lung field: Secondary | ICD-10-CM | POA: Diagnosis not present

## 2023-07-25 ENCOUNTER — Telehealth: Payer: Self-pay | Admitting: Student in an Organized Health Care Education/Training Program

## 2023-07-25 NOTE — Telephone Encounter (Signed)
 Tiffany calling with call report. For CT scan.Tiffany phone number is [phone].

## 2023-07-28 ENCOUNTER — Telehealth: Payer: Self-pay | Admitting: Cardiology

## 2023-07-28 NOTE — Telephone Encounter (Signed)
 New Message:     Patient says she have not put her Monitor on. She says she is putting it on today. Patient wants to know does she need her appointment with Dr Carl Charnley on Thursday(07-30-22) or should she have the appointment after wearing the Monitor?

## 2023-07-28 NOTE — Telephone Encounter (Signed)
 Call placed back to the patient. She was ordered a monitor to wear for 2 weeks in November and stated that she forgot to place it on. She will have a friend help her to put in on today.  She would like to know if she should delay her office visit with Dr. Sharlene Dawn on 2/13.

## 2023-07-29 DIAGNOSIS — M25511 Pain in right shoulder: Secondary | ICD-10-CM | POA: Diagnosis not present

## 2023-07-29 DIAGNOSIS — M1711 Unilateral primary osteoarthritis, right knee: Secondary | ICD-10-CM | POA: Diagnosis not present

## 2023-07-30 ENCOUNTER — Encounter: Payer: Self-pay | Admitting: Student in an Organized Health Care Education/Training Program

## 2023-07-30 ENCOUNTER — Ambulatory Visit: Payer: PPO | Admitting: Student in an Organized Health Care Education/Training Program

## 2023-07-30 ENCOUNTER — Telehealth: Payer: Self-pay | Admitting: Cardiology

## 2023-07-30 ENCOUNTER — Inpatient Hospital Stay: Payer: PPO | Attending: Internal Medicine

## 2023-07-30 VITALS — BP 132/78 | HR 75 | Temp 96.9°F | Ht 59.0 in | Wt 117.4 lb

## 2023-07-30 DIAGNOSIS — J849 Interstitial pulmonary disease, unspecified: Secondary | ICD-10-CM

## 2023-07-30 DIAGNOSIS — R911 Solitary pulmonary nodule: Secondary | ICD-10-CM | POA: Diagnosis not present

## 2023-07-30 DIAGNOSIS — Z23 Encounter for immunization: Secondary | ICD-10-CM | POA: Diagnosis not present

## 2023-07-30 DIAGNOSIS — Z87891 Personal history of nicotine dependence: Secondary | ICD-10-CM

## 2023-07-30 LAB — CBC WITH DIFFERENTIAL (CANCER CENTER ONLY)
Abs Immature Granulocytes: 0.12 10*3/uL — ABNORMAL HIGH (ref 0.00–0.07)
Basophils Absolute: 0.1 10*3/uL (ref 0.0–0.1)
Basophils Relative: 0 %
Eosinophils Absolute: 0 10*3/uL (ref 0.0–0.5)
Eosinophils Relative: 0 %
HCT: 47.4 % — ABNORMAL HIGH (ref 36.0–46.0)
Hemoglobin: 15.2 g/dL — ABNORMAL HIGH (ref 12.0–15.0)
Immature Granulocytes: 1 %
Lymphocytes Relative: 16 %
Lymphs Abs: 2.2 10*3/uL (ref 0.7–4.0)
MCH: 32.1 pg (ref 26.0–34.0)
MCHC: 32.1 g/dL (ref 30.0–36.0)
MCV: 100.2 fL — ABNORMAL HIGH (ref 80.0–100.0)
Monocytes Absolute: 0.7 10*3/uL (ref 0.1–1.0)
Monocytes Relative: 5 %
Neutro Abs: 10.6 10*3/uL — ABNORMAL HIGH (ref 1.7–7.7)
Neutrophils Relative %: 78 %
Platelet Count: 354 10*3/uL (ref 150–400)
RBC: 4.73 MIL/uL (ref 3.87–5.11)
RDW: 13.1 % (ref 11.5–15.5)
WBC Count: 13.7 10*3/uL — ABNORMAL HIGH (ref 4.0–10.5)
nRBC: 0 % (ref 0.0–0.2)

## 2023-07-30 LAB — IRON AND TIBC
Iron: 120 ug/dL (ref 28–170)
Saturation Ratios: 35 % — ABNORMAL HIGH (ref 10.4–31.8)
TIBC: 344 ug/dL (ref 250–450)
UIBC: 224 ug/dL

## 2023-07-30 LAB — FERRITIN: Ferritin: 167 ng/mL (ref 11–307)

## 2023-07-30 NOTE — H&P (View-Only) (Signed)
 Synopsis: Referred in for pulmonary nodule and ILD by Erasmo Downer, MD  Assessment & Plan:   #Lung nodule #Tobacco Use Disorder   Nodule Location: RLL Nodule Size Nodule Spiculation: No Associated Lymphadenopathy: No Smoking Status (current) and pack years: 55 Extrathoracic cancer > 5 years prior (no) SPN malignancy risk score [facility]): 12 %risk of malignancy ECOG: 0  Patient was noted to have a right lower lobe nodule on a low-dose CT scan of the chest performed for lung cancer screening.  This CT scan was also notable for findings consistent with interstitial lung disease.  Following our previous visit, we decided to proceed with repeating the CT scan of the chest (in high-resolution) and also obtain a PET/CT to assess for FDG avidity.  PET/CT was notable for mild FDG avidity in the right lower lobe nodule while high-resolution chest CT showed an enlarging right lower lobe nodule (from 8 to 10 mm).  Overall, these findings are concerning for malignancy though other etiologies on the differential include benign/inflammatory causes (infectious, rheumatoid nodule, confluence of inflammation secondary to ILD).  The nodule is new when compared to a year ago and is associated with progression in the subpleural reticulation noted on CT.  We discussed the importance of diagnosis and staging in lung malignancies, and the approach to obtaining a tissue diagnosis which would include robotic assisted navigational bronchoscopy with endobronchial ultrasound guided sampling.  We also discussed the risks associated with the procedure which include a 2% risk of pneumothorax, infection, bleeding, and nondiagnostic procedure in detail.  I explained that patients typically are able to return home the same day of the procedure, but in rare cases admission to the hospital for observation and treatment is required.  After our discussion, the patient elected to proceed with the  procedure  Recommendations: - CT SUPER D CHEST WO CONTRAST; Future - Procedural/ Surgical Case Request: ROBOTIC ASSISTED NAVIGATIONAL BRONCHOSCOPY; Future  #ILD  Noted to have findings concerning for interstitial lung disease on her LDCT performed for lung cancer screening. I am highly concerned for CTD-ILD, with a differential of RA (rheumatologist impression that previous anti CCP was false positive) vs mixed connective tissue disease vs MDA-5 associated ILD. Other differentials include NSIP, DIP, and IPF. Patient remains with minimal respiratory symptoms, and does not report shortness of breath or cough. Following our previous visit, a HRCT was obtained showing mild basilar predominant subpleural reticulation which was suggestive of UIP vs NSIP. Auto-immune workup was resent, and both ANA and anti-CCP were negative. 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 She is maintained on Golimumab and followed by Dr. Allena Katz at [facility]. I will present April Best's case during an upcoming multi-disciplinary conference after we obtain a biopsy of her lung nodule.  -will repeat MDA-5 -will discuss in multi-disciplinary ILD board -PFT's ordered -smoking cessation   I spent 33 minutes caring for this patient today, including preparing to see the patient, obtaining a medical history , reviewing a separately obtained history, performing a medically appropriate examination and/or evaluation, counseling and educating the patient/family/caregiver, ordering medications, tests, or procedures, documenting clinical information in the electronic health record, and independently interpreting results (not separately reported/billed) and communicating results to the patient/family/caregiver. This visit is part of longitudinal follow up for the patient's complex medical condition.  [name], MD [place]   End of visit medications:  No orders of the defined types were placed in this encounter.    Current Outpatient Medications:    amLODipine (NORVASC) 5 MG tablet, TAKE 1 TABLET BY MOUTH DAILY, Disp: 90 tablet, Rfl: 1   amoxicillin (AMOXIL) 500 MG capsule, Take 500 mg by mouth 3 (three) times daily., Disp: , Rfl:    azelastine (ASTELIN) 0.1 % nasal spray, Place 1 spray into both nostrils daily as needed., Disp: , Rfl:    Bempedoic Acid-Ezetimibe (NEXLIZET) 180-10 MG TABS, Take 1 tablet by mouth daily., Disp: 30 tablet, Rfl: 3   clobetasol ointment (TEMOVATE) 0.05 %, APP TOPICALLY TO GLUTEAL AREA ONCE D PRF DERMATITIS, Disp: 30 g, Rfl: 1   diclofenac Sodium (VOLTAREN) 1 % GEL, Apply 2 g topically 4 (four) times daily., Disp: , Rfl:    DULoxetine (CYMBALTA) 20 MG capsule, Cymbalta 20 mg capsule,delayed release  Take 1 capsule twice a day by oral route., Disp: , Rfl:    escitalopram (LEXAPRO) 20 MG tablet, TAKE ONE TABLET BY MOUTH EVERY DAY, Disp: 90 tablet, Rfl: 1   fluticasone (FLONASE) 50 MCG/ACT nasal spray, Place 2 sprays into both nostrils daily., Disp: , Rfl:    gabapentin (NEURONTIN) 100 MG capsule, Take 100 mg by mouth 2 (two) times daily as needed., Disp: , Rfl:    gabapentin (NEURONTIN) 300 MG capsule, Take 300 mg by mouth as needed (qhs). PRN, Disp: , Rfl:    gentamicin ointment (GARAMYCIN) 0.1 %, Apply 1 application  topically 3 (three) times daily., Disp: , Rfl:    Golimumab (SIMPONI ARIA IV), Inject into the vein every 8 (eight) weeks. Infusion for RA., Disp: , Rfl:    Golimumab (SIMPONI ARIA IV), Inject into the vein every 8 (eight) weeks., Disp: , Rfl:    loratadine (CLARITIN) 10 MG tablet, Take 10 mg by mouth daily., Disp: , Rfl:    losartan (COZAAR) 25 MG tablet, TAKE 1 TABLET BY MOUTH DAILY, Disp: 90 tablet, Rfl: 1   methocarbamol (ROBAXIN) 750 MG tablet, methocarbamol 750 mg tablet  Take 1 tablet 3 times a day by oral route for 30 days., Disp: , Rfl:     nitroGLYCERIN (NITROSTAT) 0.4 MG SL tablet, Place 1 tablet (0.4 mg total) under the tongue every 5 (five) minutes as needed for chest pain., Disp: 30 tablet, Rfl: 12   ondansetron (ZOFRAN-ODT) 4 MG disintegrating tablet, Take 1 tablet (4 mg total) by mouth every 8 (eight) hours as needed for nausea or vomiting., Disp: 20 tablet, Rfl: 0   oxyCODONE-acetaminophen (PERCOCET) 10-325 MG tablet, Take 1 tablet by mouth every 4 (four) hours as needed for pain., Disp: , Rfl:    potassium chloride (KLOR-CON M) 10 MEQ tablet, TAKE TWO TABLETS BY MOUTH EVERY DAY, Disp: 60 tablet, Rfl: 1   predniSONE (DELTASONE) 10 MG tablet, Take 1 tablet by mouth daily., Disp: , Rfl:    traZODone (DESYREL) 100 MG tablet, TAKE 0.5-1 TABLET BY MOUTH AT BEDTIME., Disp: 30 tablet, Rfl: 5   triamcinolone cream (KENALOG) 0.1 %, Apply topically 2 (two) times daily as needed., Disp: , Rfl:    Budeson-Glycopyrrol-Formoterol (BREZTRI AEROSPHERE) 160-9-4.8 MCG/ACT AERO, Inhale 2 puffs into the lungs 2 (two) times daily. (Patient not taking: Reported on 07/30/2023), Disp: 10.7 g, Rfl: 11   Subjective:   PATIENT ID: April Best GENDER: female DOB: Nov 28, 1950, MRN: [id number]  Chief Complaint  Patient presents with   Follow-up    No SOB, wheezing or cough.    HPI  April Best is  a pleasant 73 year old female with a past medical history of psoriatic arthritis followed by Dr. Allena Katz from rheumatology presenting for follow up of ILD as well as pulmonary nodule.  Patient's symptoms have remained unchanged since our last visit. She has not had any new respiratory symptoms, and denies shortness of breath at rest or with exertion. She has a minimal cough, but no sputum production, and no hemoptysis. No fevers or chills reported. No chest pain or chest tightness. Overall, she remains in her usual state of health.   Patient has a history of Psoriatic arthritis followed by Dr. Allena Katz from [facility]. She's currently maintained on  Simponi Aria (Golimumab) and has tolerated her infusions without recent flares. She is also maintained on low dose prednisone at 10 mg daily for joint aches. She was previously on methotrexate, and etanercept. She previously had a positive anti-CCP antibody felt to be a false positive by her rheumatologist, as well as a positive ANA. She reports multiple joint pains and joint aches. She also has a history of CAD followed by cardiology    Patient has a long standing history of smoking, and currently continues to smoke. Given this, she was enrolled in our lung cancer screening program. A LDCT was performed on 04/29/2023 showing findings concerning for interstitial lung disease as well as a right lower lobe nodule.   She has a history of smoking, currently smoking 1 pack a day. She has around 55 pack years of smoking history. Denies occupational exposures.  Ancillary information including prior medications, full medical/surgical/family/social histories, and PFTs (when available) are listed below and have been reviewed.   Review of Systems  Constitutional:  Negative for chills, fever and weight loss.  Respiratory:  Negative for cough, hemoptysis, sputum production, shortness of breath and wheezing.   Cardiovascular:  Negative for chest pain and palpitations.     Objective:   Vitals:   07/30/23 1319  BP: 132/78  Pulse: 75  Temp: (!) 96.9 F (36.1 C)  SpO2: 96%  Weight: 117 lb 6.4 oz (53.3 kg)  Height: 4\' 11"  (1.499 m)   96% on RA BMI Readings from Last 3 Encounters:  07/30/23 23.71 kg/m  07/10/23 23.63 kg/m  06/03/23 24.04 kg/m   Wt Readings from Last 3 Encounters:  07/30/23 117 lb 6.4 oz (53.3 kg)  07/10/23 117 lb (53.1 kg)  06/03/23 119 lb (54 kg)    Physical Exam Constitutional:      Appearance: Normal appearance.  Cardiovascular:     Rate and Rhythm: Normal rate and regular rhythm.     Pulses: Normal pulses.     Heart sounds: Normal heart sounds.  Pulmonary:      Effort: Pulmonary effort is normal.     Breath sounds: Rales (bibasilar) present.  Neurological:     General: No focal deficit present.     Mental Status: She is alert and oriented to person, place, and time. Mental status is at baseline.       Ancillary Information    Past Medical History:  Diagnosis Date   Allergic rhinitis    Allergy    Annular oral lichen planus    Anxiety    Arthritis    Depression    Deviated nasal septum    nasal obstruction   Guaiac positive stools    history   H. 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   History of palpitations    Hyperlipidemia  Hypertension    Migraine    Osteoarthritis    Osteoporosis    Panic disorder    Psoriasis    Psoriatic arthritis (HCC)    Rheumatoid arteritis (HCC)    Tinnitus      Family History  Problem Relation Age of Onset   Hypertension Mother    Hyperlipidemia Mother    Hypothyroidism Mother    Fibromyalgia Mother    Hearing loss Mother    Lung cancer Father    Coronary artery disease Father    Hyperlipidemia Father    Hypertension Father    Heart attack Father    Hearing loss Sister    Hyperlipidemia Sister    Hypertension Sister    Fibromyalgia Sister    Hemochromatosis Sister    ADD / ADHD Brother    Breast cancer Neg Hx      Past Surgical History:  Procedure Laterality Date   ABDOMINAL HYSTERECTOMY  1983   Ovaries have been removed.   ABDOMINOPLASTY     APPENDECTOMY     BREAST BIOPSY Right    neg- core   COLONOSCOPY  03/2006   COLONOSCOPY WITH PROPOFOL N/A 11/19/2017   Procedure: COLONOSCOPY WITH PROPOFOL;  Surgeon: Toledo, Boykin Nearing, MD;  Location: [facility] ENDOSCOPY;  Service: Gastroenterology;  Laterality: N/A;   CORONARY/GRAFT ACUTE MI REVASCULARIZATION N/A 02/24/2022   Procedure: Coronary/Graft Acute MI Revascularization;  Surgeon: Iran Ouch, MD;  Location: [facility] INVASIVE CV LAB;  Service: Cardiovascular;  Laterality: N/A;    ENDOSCOPIC CONCHA BULLOSA RESECTION Right 01/30/2021   Procedure: ENDOSCOPIC CONCHA BULLOSA RESECTION;  Surgeon: Geanie Logan, MD;  Location: [facility];  Service: ENT;  Laterality: Right;   ESOPHAGOGASTRODUODENOSCOPY N/A 03/19/2022   Procedure: ESOPHAGOGASTRODUODENOSCOPY (EGD);  Surgeon: Regis Bill, MD;  Location: [facility] ENDOSCOPY;  Service: Gastroenterology;  Laterality: N/A;   LEFT HEART CATH AND CORONARY ANGIOGRAPHY N/A 02/24/2022   Procedure: LEFT HEART CATH AND CORONARY ANGIOGRAPHY;  Surgeon: Iran Ouch, MD;  Location: [facility] INVASIVE CV LAB;  Service: Cardiovascular;  Laterality: N/A;   RHINOPLASTY  2002   SEPTOPLASTY N/A 01/30/2021   Procedure: SEPTOPLASTY;  Surgeon: Geanie Logan, MD;  Location: [facility];  Service: ENT;  Laterality: N/A;   UPPER GI ENDOSCOPY  2008    Social History   Socioeconomic History   Marital status: Married    Spouse name: Not on file   Number of children: 1   Years of education: Not on file   Highest education level: High school graduate  Occupational History   Occupation: retired  Tobacco Use   Smoking status: Every Day    Current packs/day: 1.00    Average packs/day: 1 pack/day for 54.1 years (54.1 ttl pk-yrs)    Types: Cigarettes    Start date: 1971   Smokeless tobacco: Never   Tobacco comments:    Smoked 1 PPD- khj 07/30/2023  Vaping Use   Vaping status: Never Used  Substance and Sexual Activity   Alcohol use: Not Currently    Alcohol/week: 0.0 standard drinks of alcohol   Drug use: No   Sexual activity: Yes    Birth control/protection: Surgical  Other Topics Concern   Not on file  Social History Narrative   Not on file   Social Drivers of Health   Financial Resource Strain: Low Risk  (10/07/2022)   Overall Financial Resource Strain (CARDIA)    Difficulty of Paying Living Expenses: Not hard at all  Food Insecurity: No Food Insecurity (10/07/2022)  Hunger Vital Sign    Worried About Running Out of Food  in the Last Year: Never true    Ran Out of Food in the Last Year: Never true  Transportation Needs: No Transportation Needs (10/07/2022)   PRAPARE - Administrator, Civil Service (Medical): No    Lack of Transportation (Non-Medical): No  Physical Activity: Inactive (10/07/2022)   Exercise Vital Sign    Days of Exercise per Week: 0 days    Minutes of Exercise per Session: 0 min  Stress: No Stress Concern Present (10/07/2022)   [employer] of Occupational Health - Occupational Stress Questionnaire    Feeling of Stress : Not at all  Social Connections: Moderately Integrated (10/07/2022)   Social Connection and Isolation Panel [NHANES]    Frequency of Communication with Friends and Family: More than three times a week    Frequency of Social Gatherings with Friends and Family: Three times a week    Attends Religious Services: Never    Active Member of Clubs or Organizations: Yes    Attends Banker Meetings: Never    Marital Status: Married  Catering manager Violence: Patient Declined (02/24/2022)   Humiliation, Afraid, Rape, and Kick questionnaire    Fear of Current or Ex-Partner: Patient declined    Emotionally Abused: Patient declined    Physically Abused: Patient declined    Sexually Abused: Patient declined     Allergies  Allergen Reactions   Enbrel [Etanercept] Hives    Other reaction(s): Muscle Cramps   Modafinil Rash   Augmentin  [Amoxicillin-Pot Clavulanate] Nausea And Vomiting    as stated (XR).   Cefdinir Diarrhea and Other (See Comments)    Other reaction(s): GI intolerance, Other (see comments) Gives her Cdiff Gives her Cdiff    Lisinopril Cough     CBC    Component Value Date/Time   WBC 13.7 (H) 07/30/2023 1029   WBC 11.0 (H) 07/31/2022 1112   RBC 4.73 07/30/2023 1029   HGB 15.2 (H) 07/30/2023 1029   HGB 13.9 04/23/2021 0917   HCT 47.4 (H) 07/30/2023 1029   HCT 42.3 04/23/2021 0917   PLT 354 07/30/2023 1029   PLT 599 (H)  04/23/2021 0917   MCV 100.2 (H) 07/30/2023 1029   MCV 96 04/23/2021 0917   MCV 98 10/18/2013 1529   MCH 32.1 07/30/2023 1029   MCHC 32.1 07/30/2023 1029   RDW 13.1 07/30/2023 1029   RDW 11.3 (L) 04/23/2021 0917   RDW 12.3 10/18/2013 1529   LYMPHSABS 2.2 07/30/2023 1029   LYMPHSABS 3.2 (H) 04/23/2021 0917   LYMPHSABS 2.8 10/18/2013 1529   MONOABS 0.7 07/30/2023 1029   MONOABS 0.7 10/18/2013 1529   EOSABS 0.0 07/30/2023 1029   EOSABS 0.7 (H) 04/23/2021 0917   EOSABS 0.2 10/18/2013 1529   BASOSABS 0.1 07/30/2023 1029   BASOSABS 0.0 04/23/2021 0917   BASOSABS 0.2 (H) 10/18/2013 1529    Pulmonary Functions Testing Results:     No data to display          Outpatient Medications Prior to Visit  Medication Sig Dispense Refill   amLODipine (NORVASC) 5 MG tablet TAKE 1 TABLET BY MOUTH DAILY 90 tablet 1   amoxicillin (AMOXIL) 500 MG capsule Take 500 mg by mouth 3 (three) times daily.     azelastine (ASTELIN) 0.1 % nasal spray Place 1 spray into both nostrils daily as needed.     Bempedoic Acid-Ezetimibe (NEXLIZET) 180-10 MG TABS Take 1 tablet by mouth daily.  30 tablet 3   clobetasol ointment (TEMOVATE) 0.05 % APP TOPICALLY TO GLUTEAL AREA ONCE D PRF DERMATITIS 30 g 1   diclofenac Sodium (VOLTAREN) 1 % GEL Apply 2 g topically 4 (four) times daily.     DULoxetine (CYMBALTA) 20 MG capsule Cymbalta 20 mg capsule,delayed release  Take 1 capsule twice a day by oral route.     escitalopram (LEXAPRO) 20 MG tablet TAKE ONE TABLET BY MOUTH EVERY DAY 90 tablet 1   fluticasone (FLONASE) 50 MCG/ACT nasal spray Place 2 sprays into both nostrils daily.     gabapentin (NEURONTIN) 100 MG capsule Take 100 mg by mouth 2 (two) times daily as needed.     gabapentin (NEURONTIN) 300 MG capsule Take 300 mg by mouth as needed (qhs). PRN     gentamicin ointment (GARAMYCIN) 0.1 % Apply 1 application  topically 3 (three) times daily.     Golimumab (SIMPONI ARIA IV) Inject into the vein every 8 (eight) weeks.  Infusion for RA.     Golimumab (SIMPONI ARIA IV) Inject into the vein every 8 (eight) weeks.     loratadine (CLARITIN) 10 MG tablet Take 10 mg by mouth daily.     losartan (COZAAR) 25 MG tablet TAKE 1 TABLET BY MOUTH DAILY 90 tablet 1   methocarbamol (ROBAXIN) 750 MG tablet methocarbamol 750 mg tablet  Take 1 tablet 3 times a day by oral route for 30 days.     nitroGLYCERIN (NITROSTAT) 0.4 MG SL tablet Place 1 tablet (0.4 mg total) under the tongue every 5 (five) minutes as needed for chest pain. 30 tablet 12   ondansetron (ZOFRAN-ODT) 4 MG disintegrating tablet Take 1 tablet (4 mg total) by mouth every 8 (eight) hours as needed for nausea or vomiting. 20 tablet 0   oxyCODONE-acetaminophen (PERCOCET) 10-325 MG tablet Take 1 tablet by mouth every 4 (four) hours as needed for pain.     potassium chloride (KLOR-CON M) 10 MEQ tablet TAKE TWO TABLETS BY MOUTH EVERY DAY 60 tablet 1   predniSONE (DELTASONE) 10 MG tablet Take 1 tablet by mouth daily.     traZODone (DESYREL) 100 MG tablet TAKE 0.5-1 TABLET BY MOUTH AT BEDTIME. 30 tablet 5   triamcinolone cream (KENALOG) 0.1 % Apply topically 2 (two) times daily as needed.     Budeson-Glycopyrrol-Formoterol (BREZTRI AEROSPHERE) 160-9-4.8 MCG/ACT AERO Inhale 2 puffs into the lungs 2 (two) times daily. (Patient not taking: Reported on 07/30/2023) 10.7 g 11   No facility-administered medications prior to visit.

## 2023-07-30 NOTE — Telephone Encounter (Signed)
Patient is returning call. Please advise?

## 2023-07-30 NOTE — Telephone Encounter (Signed)
-----   Message from [facility] S sent at 07/30/2023 10:16 AM EST ----- Can you please contact patient and push patients appt out for about 4 weeks so we cvan have the monitor results?  Thanks! ----- Message ----- From: Kendrick Fries, CMA Sent: 07/30/2023   9:15 AM EST To: Margrett Rud, CMA  Please advise pt has f/u tomorrow with [facility]. Pt pending Zio placement by someone at home. See message below if ok to keep appointment.  Sandi Mariscal, RN   07/28/23  3:19 PM Note Call placed back to the patient. She was ordered a monitor to wear for 2 weeks in November and stated that she forgot to place it on. She will have a friend help her to put in on today.   She would like to know if she should delay her office visit with Dr. Ulyses Jarred on 2/13.

## 2023-07-30 NOTE — Telephone Encounter (Signed)
Appt scheduled

## 2023-07-30 NOTE — Telephone Encounter (Signed)
Left voicemail for return call

## 2023-07-30 NOTE — Telephone Encounter (Signed)
Message sent to scheduler to push appt out until after zio

## 2023-07-30 NOTE — Progress Notes (Unsigned)
Synopsis: Referred in for pulmonary nodule and ILD by Erasmo Downer, MD  Assessment & Plan:   #Lung nodule #Tobacco Use Disorder   Nodule Location: RLL Nodule Size Nodule Spiculation: No Associated Lymphadenopathy: No Smoking Status (current) and pack years: 55 Extrathoracic cancer > 5 years prior (no) SPN malignancy risk score [facility]): 12 %risk of malignancy ECOG: 0  Patient was noted to have a right lower lobe nodule on a low-dose CT scan of the chest performed for lung cancer screening.  This CT scan was also notable for findings consistent with interstitial lung disease.  Following our previous visit, we decided to proceed with repeating the CT scan of the chest (in high-resolution) and also obtain a PET/CT to assess for FDG avidity.  PET/CT was notable for mild FDG avidity in the right lower lobe nodule while high-resolution chest CT showed an enlarging right lower lobe nodule (from 8 to 10 mm).  Overall, these findings are concerning for malignancy though other etiologies on the differential include benign/inflammatory causes (infectious, rheumatoid nodule, confluence of inflammation secondary to ILD).  The nodule is new when compared to a year ago and is associated with progression in the subpleural reticulation noted on CT.  We discussed the importance of diagnosis and staging in lung malignancies, and the approach to obtaining a tissue diagnosis which would include robotic assisted navigational bronchoscopy with endobronchial ultrasound guided sampling.  We also discussed the risks associated with the procedure which include a 2% risk of pneumothorax, infection, bleeding, and nondiagnostic procedure in detail.  I explained that patients typically are able to return home the same day of the procedure, but in rare cases admission to the hospital for observation and treatment is required.  After our discussion, the patient elected to proceed with the  procedure  Recommendations: - CT SUPER D CHEST WO CONTRAST; Future - Procedural/ Surgical Case Request: ROBOTIC ASSISTED NAVIGATIONAL BRONCHOSCOPY; Future  #ILD  Noted to have findings concerning for interstitial lung disease on her LDCT performed for lung cancer screening. I am highly concerned for CTD-ILD, with a differential of RA (rheumatologist impression that previous anti CCP was false positive) vs mixed connective tissue disease vs MDA-5 associated ILD. Other differentials include NSIP, DIP, and IPF. Patient remains with minimal respiratory symptoms, and does not report shortness of breath or cough. Following our previous visit, a HRCT was obtained showing mild basilar predominant subpleural reticulation which was suggestive of UIP vs NSIP. Auto-immune workup was resent, and both ANA and anti-CCP were negative. 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 She is maintained on Golimumab and followed by Dr. Allena Katz at [facility]. I will present April Best's case during an upcoming multi-disciplinary conference after we obtain a biopsy of her lung nodule.  -will repeat MDA-5 -will discuss in multi-disciplinary ILD board -PFT's ordered -smoking cessation   I spent 33 minutes caring for this patient today, including preparing to see the patient, obtaining a medical history , reviewing a separately obtained history, performing a medically appropriate examination and/or evaluation, counseling and educating the patient/family/caregiver, ordering medications, tests, or procedures, documenting clinical information in the electronic health record, and independently interpreting results (not separately reported/billed) and communicating results to the patient/family/caregiver. This visit is part of longitudinal follow up for the patient's complex medical condition.  [name], MD [facility]   End of visit medications:  No orders of the defined types were placed in this encounter.    Current Outpatient Medications:    amLODipine (NORVASC) 5 MG tablet, TAKE 1 TABLET BY MOUTH DAILY, Disp: 90 tablet, Rfl: 1   amoxicillin (AMOXIL) 500 MG capsule, Take 500 mg by mouth 3 (three) times daily., Disp: , Rfl:    azelastine (ASTELIN) 0.1 % nasal spray, Place 1 spray into both nostrils daily as needed., Disp: , Rfl:    Bempedoic Acid-Ezetimibe (NEXLIZET) 180-10 MG TABS, Take 1 tablet by mouth daily., Disp: 30 tablet, Rfl: 3   clobetasol ointment (TEMOVATE) 0.05 %, APP TOPICALLY TO GLUTEAL AREA ONCE D PRF DERMATITIS, Disp: 30 g, Rfl: 1   diclofenac Sodium (VOLTAREN) 1 % GEL, Apply 2 g topically 4 (four) times daily., Disp: , Rfl:    DULoxetine (CYMBALTA) 20 MG capsule, Cymbalta 20 mg capsule,delayed release  Take 1 capsule twice a day by oral route., Disp: , Rfl:    escitalopram (LEXAPRO) 20 MG tablet, TAKE ONE TABLET BY MOUTH EVERY DAY, Disp: 90 tablet, Rfl: 1   fluticasone (FLONASE) 50 MCG/ACT nasal spray, Place 2 sprays into both nostrils daily., Disp: , Rfl:    gabapentin (NEURONTIN) 100 MG capsule, Take 100 mg by mouth 2 (two) times daily as needed., Disp: , Rfl:    gabapentin (NEURONTIN) 300 MG capsule, Take 300 mg by mouth as needed (qhs). PRN, Disp: , Rfl:    gentamicin ointment (GARAMYCIN) 0.1 %, Apply 1 application  topically 3 (three) times daily., Disp: , Rfl:    Golimumab (SIMPONI ARIA IV), Inject into the vein every 8 (eight) weeks. Infusion for RA., Disp: , Rfl:    Golimumab (SIMPONI ARIA IV), Inject into the vein every 8 (eight) weeks., Disp: , Rfl:    loratadine (CLARITIN) 10 MG tablet, Take 10 mg by mouth daily., Disp: , Rfl:    losartan (COZAAR) 25 MG tablet, TAKE 1 TABLET BY MOUTH DAILY, Disp: 90 tablet, Rfl: 1   methocarbamol (ROBAXIN) 750 MG tablet, methocarbamol 750 mg tablet  Take 1 tablet 3 times a day by oral route for 30 days., Disp: , Rfl:     nitroGLYCERIN (NITROSTAT) 0.4 MG SL tablet, Place 1 tablet (0.4 mg total) under the tongue every 5 (five) minutes as needed for chest pain., Disp: 30 tablet, Rfl: 12   ondansetron (ZOFRAN-ODT) 4 MG disintegrating tablet, Take 1 tablet (4 mg total) by mouth every 8 (eight) hours as needed for nausea or vomiting., Disp: 20 tablet, Rfl: 0   oxyCODONE-acetaminophen (PERCOCET) 10-325 MG tablet, Take 1 tablet by mouth every 4 (four) hours as needed for pain., Disp: , Rfl:    potassium chloride (KLOR-CON M) 10 MEQ tablet, TAKE TWO TABLETS BY MOUTH EVERY DAY, Disp: 60 tablet, Rfl: 1   predniSONE (DELTASONE) 10 MG tablet, Take 1 tablet by mouth daily., Disp: , Rfl:    traZODone (DESYREL) 100 MG tablet, TAKE 0.5-1 TABLET BY MOUTH AT BEDTIME., Disp: 30 tablet, Rfl: 5   triamcinolone cream (KENALOG) 0.1 %, Apply topically 2 (two) times daily as needed., Disp: , Rfl:    Budeson-Glycopyrrol-Formoterol (BREZTRI AEROSPHERE) 160-9-4.8 MCG/ACT AERO, Inhale 2 puffs into the lungs 2 (two) times daily. (Patient not taking: Reported on 07/30/2023), Disp: 10.7 g, Rfl: 11   Subjective:   PATIENT ID: April Best GENDER: female DOB: [birth year], MRN: [id number]  Chief Complaint  Patient presents with   Follow-up    No SOB, wheezing or cough.    HPI  April Best is  a pleasant 73 year old female with a past medical history of psoriatic arthritis followed by Dr. Allena Katz from rheumatology presenting for follow up of ILD as well as pulmonary nodule.  Patient's symptoms have remained unchanged since our last visit. She has not had any new respiratory symptoms, and denies shortness of breath at rest or with exertion. She has a minimal cough, but no sputum production, and no hemoptysis. No fevers or chills reported. No chest pain or chest tightness. Overall, she remains in her usual state of health.   Patient has a history of Psoriatic arthritis followed by Dr. Allena Katz from [facility]. She's currently maintained on  Simponi Aria (Golimumab) and has tolerated her infusions without recent flares. She is also maintained on low dose prednisone at 10 mg daily for joint aches. She was previously on methotrexate, and etanercept. She previously had a positive anti-CCP antibody felt to be a false positive by her rheumatologist, as well as a positive ANA. She reports multiple joint pains and joint aches. She also has a history of CAD followed by cardiology    Patient has a long standing history of smoking, and currently continues to smoke. Given this, she was enrolled in our lung cancer screening program. A LDCT was performed on 04/29/2023 showing findings concerning for interstitial lung disease as well as a right lower lobe nodule.   She has a history of smoking, currently smoking 1 pack a day. She has around 55 pack years of smoking history. Denies occupational exposures.  Ancillary information including prior medications, full medical/surgical/family/social histories, and PFTs (when available) are listed below and have been reviewed.   Review of Systems  Constitutional:  Negative for chills, fever and weight loss.  Respiratory:  Negative for cough, hemoptysis, sputum production, shortness of breath and wheezing.   Cardiovascular:  Negative for chest pain and palpitations.     Objective:   Vitals:   07/30/23 1319  BP: 132/78  Pulse: 75  Temp: (!) 96.9 F (36.1 C)  SpO2: 96%  Weight: 117 lb 6.4 oz (53.3 kg)  Height: 4\' 11"  (1.499 m)   96% on RA BMI Readings from Last 3 Encounters:  07/30/23 23.71 kg/m  07/10/23 23.63 kg/m  06/03/23 24.04 kg/m   Wt Readings from Last 3 Encounters:  07/30/23 117 lb 6.4 oz (53.3 kg)  07/10/23 117 lb (53.1 kg)  06/03/23 119 lb (54 kg)    Physical Exam Constitutional:      Appearance: Normal appearance.  Cardiovascular:     Rate and Rhythm: Normal rate and regular rhythm.     Pulses: Normal pulses.     Heart sounds: Normal heart sounds.  Pulmonary:      Effort: Pulmonary effort is normal.     Breath sounds: Rales (bibasilar) present.  Neurological:     General: No focal deficit present.     Mental Status: She is alert and oriented to person, place, and time. Mental status is at baseline.       Ancillary Information    Past Medical History:  Diagnosis Date   Allergic rhinitis    Allergy    Annular oral lichen planus    Anxiety    Arthritis    Depression    Deviated nasal septum    nasal obstruction   Guaiac positive stools    history   H. 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   History of palpitations    Hyperlipidemia  Hypertension    Migraine    Osteoarthritis    Osteoporosis    Panic disorder    Psoriasis    Psoriatic arthritis (HCC)    Rheumatoid arteritis (HCC)    Tinnitus      Family History  Problem Relation Age of Onset   Hypertension Mother    Hyperlipidemia Mother    Hypothyroidism Mother    Fibromyalgia Mother    Hearing loss Mother    Lung cancer Father    Coronary artery disease Father    Hyperlipidemia Father    Hypertension Father    Heart attack Father    Hearing loss Sister    Hyperlipidemia Sister    Hypertension Sister    Fibromyalgia Sister    Hemochromatosis Sister    ADD / ADHD Brother    Breast cancer Neg Hx      Past Surgical History:  Procedure Laterality Date   ABDOMINAL HYSTERECTOMY  1983   Ovaries have been removed.   ABDOMINOPLASTY     APPENDECTOMY     BREAST BIOPSY Right    neg- core   COLONOSCOPY  03/2006   COLONOSCOPY WITH PROPOFOL N/A 11/19/2017   Procedure: COLONOSCOPY WITH PROPOFOL;  Surgeon: Toledo, Boykin Nearing, MD;  Location: [facility] ENDOSCOPY;  Service: Gastroenterology;  Laterality: N/A;   CORONARY/GRAFT ACUTE MI REVASCULARIZATION N/A 02/24/2022   Procedure: Coronary/Graft Acute MI Revascularization;  Surgeon: Iran Ouch, MD;  Location: [facility] INVASIVE CV LAB;  Service: Cardiovascular;  Laterality: N/A;    ENDOSCOPIC CONCHA BULLOSA RESECTION Right 01/30/2021   Procedure: ENDOSCOPIC CONCHA BULLOSA RESECTION;  Surgeon: Geanie Logan, MD;  Location: [facility];  Service: ENT;  Laterality: Right;   ESOPHAGOGASTRODUODENOSCOPY N/A 03/19/2022   Procedure: ESOPHAGOGASTRODUODENOSCOPY (EGD);  Surgeon: Regis Bill, MD;  Location: [facility] ENDOSCOPY;  Service: Gastroenterology;  Laterality: N/A;   LEFT HEART CATH AND CORONARY ANGIOGRAPHY N/A 02/24/2022   Procedure: LEFT HEART CATH AND CORONARY ANGIOGRAPHY;  Surgeon: Iran Ouch, MD;  Location: [facility] INVASIVE CV LAB;  Service: Cardiovascular;  Laterality: N/A;   RHINOPLASTY  2002   SEPTOPLASTY N/A 01/30/2021   Procedure: SEPTOPLASTY;  Surgeon: Geanie Logan, MD;  Location: [facility];  Service: ENT;  Laterality: N/A;   UPPER GI ENDOSCOPY  2008    Social History   Socioeconomic History   Marital status: Married    Spouse name: Not on file   Number of children: 1   Years of education: Not on file   Highest education level: High school graduate  Occupational History   Occupation: retired  Tobacco Use   Smoking status: Every Day    Current packs/day: 1.00    Average packs/day: 1 pack/day for 54.1 years (54.1 ttl pk-yrs)    Types: Cigarettes    Start date: 1971   Smokeless tobacco: Never   Tobacco comments:    Smoked 1 PPD- khj 07/30/2023  Vaping Use   Vaping status: Never Used  Substance and Sexual Activity   Alcohol use: Not Currently    Alcohol/week: 0.0 standard drinks of alcohol   Drug use: No   Sexual activity: Yes    Birth control/protection: Surgical  Other Topics Concern   Not on file  Social History Narrative   Not on file   Social Drivers of Health   Financial Resource Strain: Low Risk  (10/07/2022)   Overall Financial Resource Strain (CARDIA)    Difficulty of Paying Living Expenses: Not hard at all  Food Insecurity: No Food Insecurity (10/07/2022)  Hunger Vital Sign    Worried About Running Out of Food  in the Last Year: Never true    Ran Out of Food in the Last Year: Never true  Transportation Needs: No Transportation Needs (10/07/2022)   PRAPARE - Administrator, Civil Service (Medical): No    Lack of Transportation (Non-Medical): No  Physical Activity: Inactive (10/07/2022)   Exercise Vital Sign    Days of Exercise per Week: 0 days    Minutes of Exercise per Session: 0 min  Stress: No Stress Concern Present (10/07/2022)   [employer] of Occupational Health - Occupational Stress Questionnaire    Feeling of Stress : Not at all  Social Connections: Moderately Integrated (10/07/2022)   Social Connection and Isolation Panel [NHANES]    Frequency of Communication with Friends and Family: More than three times a week    Frequency of Social Gatherings with Friends and Family: Three times a week    Attends Religious Services: Never    Active Member of Clubs or Organizations: Yes    Attends Banker Meetings: Never    Marital Status: Married  Catering manager Violence: Patient Declined (02/24/2022)   Humiliation, Afraid, Rape, and Kick questionnaire    Fear of Current or Ex-Partner: Patient declined    Emotionally Abused: Patient declined    Physically Abused: Patient declined    Sexually Abused: Patient declined     Allergies  Allergen Reactions   Enbrel [Etanercept] Hives    Other reaction(s): Muscle Cramps   Modafinil Rash   Augmentin  [Amoxicillin-Pot Clavulanate] Nausea And Vomiting    as stated (XR).   Cefdinir Diarrhea and Other (See Comments)    Other reaction(s): GI intolerance, Other (see comments) Gives her Cdiff Gives her Cdiff    Lisinopril Cough     CBC    Component Value Date/Time   WBC 13.7 (H) 07/30/2023 1029   WBC 11.0 (H) 07/31/2022 1112   RBC 4.73 07/30/2023 1029   HGB 15.2 (H) 07/30/2023 1029   HGB 13.9 04/23/2021 0917   HCT 47.4 (H) 07/30/2023 1029   HCT 42.3 04/23/2021 0917   PLT 354 07/30/2023 1029   PLT 599 (H)  04/23/2021 0917   MCV 100.2 (H) 07/30/2023 1029   MCV 96 04/23/2021 0917   MCV 98 10/18/2013 1529   MCH 32.1 07/30/2023 1029   MCHC 32.1 07/30/2023 1029   RDW 13.1 07/30/2023 1029   RDW 11.3 (L) 04/23/2021 0917   RDW 12.3 10/18/2013 1529   LYMPHSABS 2.2 07/30/2023 1029   LYMPHSABS 3.2 (H) 04/23/2021 0917   LYMPHSABS 2.8 10/18/2013 1529   MONOABS 0.7 07/30/2023 1029   MONOABS 0.7 10/18/2013 1529   EOSABS 0.0 07/30/2023 1029   EOSABS 0.7 (H) 04/23/2021 0917   EOSABS 0.2 10/18/2013 1529   BASOSABS 0.1 07/30/2023 1029   BASOSABS 0.0 04/23/2021 0917   BASOSABS 0.2 (H) 10/18/2013 1529    Pulmonary Functions Testing Results:     No data to display          Outpatient Medications Prior to Visit  Medication Sig Dispense Refill   amLODipine (NORVASC) 5 MG tablet TAKE 1 TABLET BY MOUTH DAILY 90 tablet 1   amoxicillin (AMOXIL) 500 MG capsule Take 500 mg by mouth 3 (three) times daily.     azelastine (ASTELIN) 0.1 % nasal spray Place 1 spray into both nostrils daily as needed.     Bempedoic Acid-Ezetimibe (NEXLIZET) 180-10 MG TABS Take 1 tablet by mouth daily.  30 tablet 3   clobetasol ointment (TEMOVATE) 0.05 % APP TOPICALLY TO GLUTEAL AREA ONCE D PRF DERMATITIS 30 g 1   diclofenac Sodium (VOLTAREN) 1 % GEL Apply 2 g topically 4 (four) times daily.     DULoxetine (CYMBALTA) 20 MG capsule Cymbalta 20 mg capsule,delayed release  Take 1 capsule twice a day by oral route.     escitalopram (LEXAPRO) 20 MG tablet TAKE ONE TABLET BY MOUTH EVERY DAY 90 tablet 1   fluticasone (FLONASE) 50 MCG/ACT nasal spray Place 2 sprays into both nostrils daily.     gabapentin (NEURONTIN) 100 MG capsule Take 100 mg by mouth 2 (two) times daily as needed.     gabapentin (NEURONTIN) 300 MG capsule Take 300 mg by mouth as needed (qhs). PRN     gentamicin ointment (GARAMYCIN) 0.1 % Apply 1 application  topically 3 (three) times daily.     Golimumab (SIMPONI ARIA IV) Inject into the vein every 8 (eight) weeks.  Infusion for RA.     Golimumab (SIMPONI ARIA IV) Inject into the vein every 8 (eight) weeks.     loratadine (CLARITIN) 10 MG tablet Take 10 mg by mouth daily.     losartan (COZAAR) 25 MG tablet TAKE 1 TABLET BY MOUTH DAILY 90 tablet 1   methocarbamol (ROBAXIN) 750 MG tablet methocarbamol 750 mg tablet  Take 1 tablet 3 times a day by oral route for 30 days.     nitroGLYCERIN (NITROSTAT) 0.4 MG SL tablet Place 1 tablet (0.4 mg total) under the tongue every 5 (five) minutes as needed for chest pain. 30 tablet 12   ondansetron (ZOFRAN-ODT) 4 MG disintegrating tablet Take 1 tablet (4 mg total) by mouth every 8 (eight) hours as needed for nausea or vomiting. 20 tablet 0   oxyCODONE-acetaminophen (PERCOCET) 10-325 MG tablet Take 1 tablet by mouth every 4 (four) hours as needed for pain.     potassium chloride (KLOR-CON M) 10 MEQ tablet TAKE TWO TABLETS BY MOUTH EVERY DAY 60 tablet 1   predniSONE (DELTASONE) 10 MG tablet Take 1 tablet by mouth daily.     traZODone (DESYREL) 100 MG tablet TAKE 0.5-1 TABLET BY MOUTH AT BEDTIME. 30 tablet 5   triamcinolone cream (KENALOG) 0.1 % Apply topically 2 (two) times daily as needed.     Budeson-Glycopyrrol-Formoterol (BREZTRI AEROSPHERE) 160-9-4.8 MCG/ACT AERO Inhale 2 puffs into the lungs 2 (two) times daily. (Patient not taking: Reported on 07/30/2023) 10.7 g 11   No facility-administered medications prior to visit.

## 2023-07-30 NOTE — Telephone Encounter (Signed)
Patient reached. Appointment scheduled.

## 2023-07-31 ENCOUNTER — Ambulatory Visit: Payer: PPO | Admitting: Cardiology

## 2023-08-06 ENCOUNTER — Telehealth: Payer: Self-pay | Admitting: Student in an Organized Health Care Education/Training Program

## 2023-08-06 ENCOUNTER — Inpatient Hospital Stay: Payer: PPO | Admitting: Internal Medicine

## 2023-08-06 ENCOUNTER — Ambulatory Visit: Admission: RE | Admit: 2023-08-06 | Payer: PPO | Source: Ambulatory Visit

## 2023-08-06 ENCOUNTER — Inpatient Hospital Stay: Payer: PPO

## 2023-08-06 ENCOUNTER — Encounter: Payer: Self-pay | Admitting: Internal Medicine

## 2023-08-06 DIAGNOSIS — R911 Solitary pulmonary nodule: Secondary | ICD-10-CM

## 2023-08-06 NOTE — Telephone Encounter (Signed)
See other telephone encounter from today (2/19).  Bronch and CT have been canceled.  Nothing further needed.

## 2023-08-06 NOTE — Telephone Encounter (Signed)
OK her CT has been CXL

## 2023-08-06 NOTE — Assessment & Plan Note (Addendum)
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  Patient continues to be asymptomatic; no evidence of organ dysfunction/overload. HOLD phlebotomy today.  # JAN 2025- nodule of concern posteriorly in the right lower lobe I with accentuated metabolic activity in this location with maximum SUV of 3.7 -suspicious for malignancy.CT scan FEB 2025- [Dr.Dgyali]10 mm posterolateral right lower lobe nodule has enlarged from 8 mm on 04/29/2023 S/p evaluation with pulmonary. Awaiting Biopsy tomorrow [might have to postpone sec to snow]   # Trigeminal neuralgia- [Dr.Shah]- defer to neurology- on gabapentin.   # Psoriasis arthritis-on? Enbrel-cost issue [Dr.Patel;[facility]; Rheum- Stable.    #Mild erythrocytosis hematocrit 47-likely secondary to smoking; recommend smoking cessation. Stable.   # Active smoker- LCSP-2023- CTA-reviewed negative for any malignancy.  Again counseled to quit smoking-cutting down. Stable.   DISPOSITION:  # HOLD Phlebotomy today # 6 months MD-  with labs [CBC,CMP- iron studies, ferritin; 1 week prior, MD/ possible phlebotomy- Dr.B

## 2023-08-06 NOTE — Progress Notes (Signed)
I connected with April Best on 08/06/23 at 10:45 AM EST by video enabled telemedicine visit and verified that I am speaking with the correct person using two identifiers.  I discussed the limitations, risks, security and privacy concerns of performing an evaluation and management service by telemedicine and the availability of in-person appointments. I also discussed with the patient that there may be a patient responsible charge related to this service. The patient expressed understanding and agreed to proceed.    Other persons participating in the visit and their role in the encounter: RN/medical reconciliation Patient's location: home Provider's location: office  Oncology History   No history exists.     Chief Complaint:[name]   History of present illness:April Best 73 y.o.  female with history of [name] following screening  without any end organ dysfunction is here for follow-up /phlebotomy.    Patient noted to have lung nodule- S/p evaluation with pulmonary. Awaiting Biopsy tomorrow.     Denies any worsening weight loss or abdominal swelling or leg swelling. Unfortunately she continues to smoke.  Observation/objective: Alert & oriented x 3. In No acute distress.   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  Patient continues to be asymptomatic; no evidence of organ dysfunction/overload. HOLD phlebotomy today.  # JAN 2025- nodule of concern posteriorly in the right lower lobe I with accentuated metabolic activity in this location with maximum SUV of 3.7 -suspicious for malignancy.CT scan FEB 2025- [Dr.Dgyali]10 mm posterolateral right lower lobe nodule has enlarged from 8 mm on 04/29/2023 S/p evaluation with pulmonary. Awaiting Biopsy tomorrow [might have to postpone sec to  snow]   # Trigeminal neuralgia- [Dr.Shah]- defer to neurology- on gabapentin.   # Psoriasis arthritis-on? Enbrel-cost issue [Dr.Patel;[facility]; Rheum- Stable.    #Mild erythrocytosis hematocrit 47-likely secondary to smoking; recommend smoking cessation. Stable.   # Active smoker- LCSP-2023- CTA-reviewed negative for any malignancy.  Again counseled to quit smoking-cutting down. Stable.   DISPOSITION:  # HOLD Phlebotomy today # 6 months MD-  with labs [CBC,CMP- iron studies, ferritin; 1 week prior, MD/ possible phlebotomy- Dr.B  Follow-up instructions:  I discussed the assessment and treatment plan with the patient.  The patient was provided an opportunity to ask questions and all were answered.  The patient agreed with the plan and demonstrated understanding of instructions.  The patient was advised to call back or seek an in person evaluation if the symptoms worsen or if the condition fails to improve as anticipated.    Dr. Louretta Shorten [facility] at [facility] 08/06/2023 10:38 AM

## 2023-08-06 NOTE — Telephone Encounter (Signed)
Should we CXL the CT appt also

## 2023-08-06 NOTE — Progress Notes (Signed)
Pt states she is having a lung biopsy in [place] tomorrow.

## 2023-08-06 NOTE — Telephone Encounter (Signed)
I spoke with the patient. She would like to cancel her Bronch for tomorrow due to the weather. I told her we will call her back with another time and date.   I have called scheduling and had them cancel the procedure.   Synetta Fail will you cancel her CT for today?

## 2023-08-06 NOTE — Telephone Encounter (Signed)
Pt states due to weather would like top reschedule Bronch

## 2023-08-06 NOTE — Progress Notes (Signed)
Just spoke with patient to review pre op instructions for tomorrows procedure. Patients states she "would like to reschedule due to upcoming weather for 08-07-23

## 2023-08-06 NOTE — Addendum Note (Signed)
Addended by: Clydia Llano on: 08/06/2023 10:40 AM   Modules accepted: Orders

## 2023-08-06 NOTE — Telephone Encounter (Signed)
Pre-Admission calling saying PT is canceling her surgery tomorrow due to weather. She will call to resched.

## 2023-08-07 ENCOUNTER — Ambulatory Visit (HOSPITAL_COMMUNITY)
Admission: RE | Admit: 2023-08-07 | Payer: PPO | Source: Home / Self Care | Admitting: Student in an Organized Health Care Education/Training Program

## 2023-08-07 ENCOUNTER — Encounter (HOSPITAL_COMMUNITY): Admission: RE | Payer: Self-pay | Source: Home / Self Care

## 2023-08-07 DIAGNOSIS — R911 Solitary pulmonary nodule: Secondary | ICD-10-CM | POA: Insufficient documentation

## 2023-08-07 SURGERY — ROBOTIC ASSISTED NAVIGATIONAL BRONCHOSCOPY
Anesthesia: General | Laterality: Bilateral

## 2023-08-11 NOTE — Telephone Encounter (Signed)
 I spoke with the patient. She said that is too far off and that you wanted done before that. I did let her know that you are the one who chose 3/20. She asking for a sooner date.

## 2023-08-11 NOTE — Telephone Encounter (Signed)
 I spoke with the patient. She is fine with Dr. Larinda Buttery doing the procedure on 3/6, but she wants to follow up with you.

## 2023-08-12 ENCOUNTER — Encounter: Payer: Self-pay | Admitting: Pulmonary Disease

## 2023-08-12 NOTE — Telephone Encounter (Signed)
 Bronchoscopy has been scheduled for 08/21/2023 at 7:30am at [facility].  CPT- [id number] Dx. Lung Nodule  Synetta Fail can you schedule her CT and let her know the dates and times of her Bronch as well. Thank you!!

## 2023-08-13 DIAGNOSIS — M25511 Pain in right shoulder: Secondary | ICD-10-CM | POA: Diagnosis not present

## 2023-08-13 NOTE — Telephone Encounter (Signed)
 I have spoke with the patient and she is aware of all the new appts that have been made

## 2023-08-15 DIAGNOSIS — M25511 Pain in right shoulder: Secondary | ICD-10-CM | POA: Diagnosis not present

## 2023-08-18 ENCOUNTER — Encounter: Payer: PPO | Admitting: Family Medicine

## 2023-08-18 DIAGNOSIS — R7303 Prediabetes: Secondary | ICD-10-CM | POA: Insufficient documentation

## 2023-08-18 NOTE — Progress Notes (Deleted)
 Complete physical exam   Patient: April Best   DOB: 09/08/1950   73 y.o. Female  MRN: [id number] Visit Date: 08/18/2023  Today's healthcare provider: Shirlee Latch, MD   No chief complaint on file.  Subjective    April Best is a 73 y.o. female who presents today for a complete physical exam.  She reports consuming a {diet types:17450} diet. {Exercise:19826} She generally feels {well/fairly well/poorly:18703}. She reports sleeping {well/fairly well/poorly:18703}. She {does/does not:200015} have additional problems to discuss today.    Discussed the use of AI scribe software for clinical note transcription with the patient, who gave verbal consent to proceed.  History of Present Illness            Last depression screening scores    08/15/2022    1:49 PM 03/06/2022    2:20 PM 08/09/2021    1:16 PM  PHQ 2/9 Scores  PHQ - 2 Score 0 2 0  PHQ- 9 Score 0 4 4   Last fall risk screening    10/07/2022   10:59 AM  Fall Risk   Falls in the past year? 0  Number falls in past yr: 0  Injury with Fall? 0  Risk for fall due to : No Fall Risks  Follow up Education provided;Falls prevention discussed    {VISON DENTAL STD PSA (Optional):27386}  {History (Optional):23778}  Medications: Outpatient Medications Prior to Visit  Medication Sig   amLODipine (NORVASC) 5 MG tablet TAKE 1 TABLET BY MOUTH DAILY   amoxicillin (AMOXIL) 500 MG capsule Take 500 mg by mouth 3 (three) times daily.   azelastine (ASTELIN) 0.1 % nasal spray Place 1 spray into both nostrils daily as needed.   Bempedoic Acid-Ezetimibe (NEXLIZET) 180-10 MG TABS Take 1 tablet by mouth daily.   Budeson-Glycopyrrol-Formoterol (BREZTRI AEROSPHERE) 160-9-4.8 MCG/ACT AERO Inhale 2 puffs into the lungs 2 (two) times daily. (Patient not taking: Reported on 04/28/2023)   clobetasol ointment (TEMOVATE) 0.05 % APP TOPICALLY TO GLUTEAL AREA ONCE D PRF DERMATITIS   diclofenac Sodium (VOLTAREN) 1 % GEL Apply 2 g  topically 4 (four) times daily.   DULoxetine (CYMBALTA) 20 MG capsule Cymbalta 20 mg capsule,delayed release  Take 1 capsule twice a day by oral route.   escitalopram (LEXAPRO) 20 MG tablet TAKE ONE TABLET BY MOUTH EVERY DAY   fluticasone (FLONASE) 50 MCG/ACT nasal spray Place 2 sprays into both nostrils daily.   gabapentin (NEURONTIN) 100 MG capsule Take 100 mg by mouth 2 (two) times daily as needed.   gabapentin (NEURONTIN) 300 MG capsule Take 300 mg by mouth as needed (qhs). PRN   gentamicin ointment (GARAMYCIN) 0.1 % Apply 1 application  topically 3 (three) times daily.   Golimumab (SIMPONI ARIA IV) Inject into the vein every 8 (eight) weeks. Infusion for RA.   Golimumab (SIMPONI ARIA IV) Inject into the vein every 8 (eight) weeks.   loratadine (CLARITIN) 10 MG tablet Take 10 mg by mouth daily.   losartan (COZAAR) 25 MG tablet TAKE 1 TABLET BY MOUTH DAILY   methocarbamol (ROBAXIN) 750 MG tablet methocarbamol 750 mg tablet  Take 1 tablet 3 times a day by oral route for 30 days.   nitroGLYCERIN (NITROSTAT) 0.4 MG SL tablet Place 1 tablet (0.4 mg total) under the tongue every 5 (five) minutes as needed for chest pain.   ondansetron (ZOFRAN-ODT) 4 MG disintegrating tablet Take 1 tablet (4 mg total) by mouth every 8 (eight) hours as needed for nausea or vomiting.  oxyCODONE-acetaminophen (PERCOCET) 10-325 MG tablet Take 1 tablet by mouth every 4 (four) hours as needed for pain.   potassium chloride (KLOR-CON M) 10 MEQ tablet TAKE TWO TABLETS BY MOUTH EVERY DAY   predniSONE (DELTASONE) 10 MG tablet Take 1 tablet by mouth daily.   traZODone (DESYREL) 100 MG tablet TAKE 0.5-1 TABLET BY MOUTH AT BEDTIME.   triamcinolone cream (KENALOG) 0.1 % Apply topically 2 (two) times daily as needed.   No facility-administered medications prior to visit.    Review of Systems {Insert previous labs (optional):23779} {See past labs  Heme  Chem  Endocrine  Serology  Results Review (optional):1}   Objective    There were no vitals taken for this visit. {Insert last BP/Wt (optional):23777}{See vitals history (optional):1}  Physical Exam Vitals reviewed.  Constitutional:      General: She is not in acute distress.    Appearance: Normal appearance. She is well-developed. She is not diaphoretic.  HENT:     Head: Normocephalic and atraumatic.     Right Ear: Tympanic membrane, ear canal and external ear normal.     Left Ear: Tympanic membrane, ear canal and external ear normal.     Nose: Nose normal.     Mouth/Throat:     Mouth: Mucous membranes are moist.     Pharynx: Oropharynx is clear. No oropharyngeal exudate.  Eyes:     General: No scleral icterus.    Conjunctiva/sclera: Conjunctivae normal.     Pupils: Pupils are equal, round, and reactive to light.  Neck:     Thyroid: No thyromegaly.  Cardiovascular:     Rate and Rhythm: Normal rate and regular rhythm.     Heart sounds: Normal heart sounds. No murmur heard. Pulmonary:     Effort: Pulmonary effort is normal. No respiratory distress.     Breath sounds: Normal breath sounds. No wheezing or rales.  Abdominal:     General: There is no distension.     Palpations: Abdomen is soft.     Tenderness: There is no abdominal tenderness.  Musculoskeletal:        General: No deformity.     Cervical back: Neck supple.     Right lower leg: No edema.     Left lower leg: No edema.  Lymphadenopathy:     Cervical: No cervical adenopathy.  Skin:    General: Skin is warm and dry.     Findings: No rash.  Neurological:     Mental Status: She is alert and oriented to person, place, and time. Mental status is at baseline.     Gait: Gait normal.  Psychiatric:        Mood and Affect: Mood normal.        Behavior: Behavior normal.        Thought Content: Thought content normal.      No results found for any visits on 08/18/23.  Assessment & Plan    Routine Health Maintenance and Physical Exam  Exercise Activities and Dietary  recommendations  Goals      DIET - EAT MORE FRUITS AND VEGETABLES     Exercise 150 minutes per week (moderate activity)     Recommend to exercise for 3 days a week for at least 30 minutes at a time.      Quit smoking / using tobacco        Immunization History  Administered Date(s) Administered   Fluad Quad(high Dose 65+) 04/23/2021, 08/15/2022   Fluad Trivalent(High Dose 65+) 07/30/2023  Influenza Split 02/21/2010, 04/22/2012, 07/15/2016   Influenza, High Dose Seasonal PF 03/05/2017, 07/08/2018   Influenza,inj,Quad PF,6+ Mos 03/23/2015   Moderna Sars-Covid-2 Vaccination 07/23/2019, 08/20/2019   Pneumococcal Conjugate-13 01/15/2017   Pneumococcal Polysaccharide-23 04/22/2012, 07/08/2018   Tdap 02/21/2010   Zoster, Live 11/04/2013    Health Maintenance  Topic Date Due   Zoster Vaccines- Shingrix (1 of 2) 12/03/1970   COVID-19 Vaccine (3 - Moderna risk series) 09/17/2019   DTaP/Tdap/Td (2 - Td or Tdap) 02/22/2020   DEXA SCAN  09/05/2021   Medicare Annual Wellness (AWV)  10/07/2023   MAMMOGRAM  03/06/2024   Lung Cancer Screening  07/14/2024   Colonoscopy  11/20/2027   Pneumonia Vaccine 22+ Years old  Completed   INFLUENZA VACCINE  Completed   Hepatitis C Screening  Completed   HPV VACCINES  Aged Out    Discussed health benefits of physical activity, and encouraged her to engage in regular exercise appropriate for her age and condition.  Problem List Items Addressed This Visit   None   Assessment and Plan               No follow-ups on file.     Shirlee Latch, MD  [facility] [phone] (phone) [phone] (fax)  [facility]

## 2023-08-20 ENCOUNTER — Ambulatory Visit
Admission: RE | Admit: 2023-08-20 | Discharge: 2023-08-20 | Disposition: A | Discharge status: Home / Self Care | Payer: PPO | Source: Ambulatory Visit | Attending: Student in an Organized Health Care Education/Training Program | Admitting: Student in an Organized Health Care Education/Training Program

## 2023-08-20 ENCOUNTER — Encounter (HOSPITAL_COMMUNITY): Payer: Self-pay | Admitting: Pulmonary Disease

## 2023-08-20 ENCOUNTER — Other Ambulatory Visit: Payer: Self-pay

## 2023-08-20 DIAGNOSIS — M25511 Pain in right shoulder: Secondary | ICD-10-CM | POA: Diagnosis not present

## 2023-08-20 DIAGNOSIS — M5459 Other low back pain: Secondary | ICD-10-CM | POA: Diagnosis not present

## 2023-08-20 DIAGNOSIS — R911 Solitary pulmonary nodule: Secondary | ICD-10-CM | POA: Diagnosis not present

## 2023-08-20 DIAGNOSIS — I714 Abdominal aortic aneurysm, without rupture, unspecified: Secondary | ICD-10-CM | POA: Insufficient documentation

## 2023-08-20 DIAGNOSIS — M5481 Occipital neuralgia: Secondary | ICD-10-CM | POA: Diagnosis not present

## 2023-08-20 DIAGNOSIS — M8438XA Stress fracture, other site, initial encounter for fracture: Secondary | ICD-10-CM | POA: Diagnosis not present

## 2023-08-20 DIAGNOSIS — G894 Chronic pain syndrome: Secondary | ICD-10-CM | POA: Diagnosis not present

## 2023-08-20 DIAGNOSIS — Z79899 Other long term (current) drug therapy: Secondary | ICD-10-CM | POA: Diagnosis not present

## 2023-08-20 DIAGNOSIS — M810 Age-related osteoporosis without current pathological fracture: Secondary | ICD-10-CM | POA: Diagnosis not present

## 2023-08-20 DIAGNOSIS — Z5181 Encounter for therapeutic drug level monitoring: Secondary | ICD-10-CM | POA: Diagnosis not present

## 2023-08-20 DIAGNOSIS — I700 Atherosclerosis of aorta: Secondary | ICD-10-CM | POA: Diagnosis not present

## 2023-08-20 DIAGNOSIS — M5412 Radiculopathy, cervical region: Secondary | ICD-10-CM | POA: Diagnosis not present

## 2023-08-20 DIAGNOSIS — R918 Other nonspecific abnormal finding of lung field: Secondary | ICD-10-CM | POA: Diagnosis not present

## 2023-08-20 DIAGNOSIS — J432 Centrilobular emphysema: Secondary | ICD-10-CM | POA: Diagnosis not present

## 2023-08-20 DIAGNOSIS — R110 Nausea: Secondary | ICD-10-CM | POA: Diagnosis not present

## 2023-08-20 DIAGNOSIS — M5416 Radiculopathy, lumbar region: Secondary | ICD-10-CM | POA: Diagnosis not present

## 2023-08-20 NOTE — Anesthesia Preprocedure Evaluation (Signed)
 Anesthesia Evaluation  Patient identified by MRN, date of birth, ID band Patient awake    Reviewed: Allergy & Precautions, NPO status , Patient's Chart, lab work & pertinent test results, reviewed documented beta blocker date and time   History of Anesthesia Complications Negative for: history of anesthetic complications  Airway Mallampati: III  TM Distance: >3 FB Neck ROM: Full    Dental no notable dental hx. (+) Chipped   Pulmonary COPD, Current Smoker and Patient abstained from smoking.   breath sounds clear to auscultation       Cardiovascular hypertension, + Peripheral Vascular Disease  (-) CAD, (-) Past MI, (-) Cardiac Stents, (-) CABG, (-) CHF and (-) Orthopnea (-) pacemaker(-) Cardiac Defibrillator  Rhythm:Regular Rate:Normal     Neuro/Psych  Headaches, neg Seizures PSYCHIATRIC DISORDERS Anxiety Depression     Neuromuscular disease    GI/Hepatic ,GERD  Medicated,,(+) neg Cirrhosis        Endo/Other   Hyperthyroidism   Renal/GU Renal disease     Musculoskeletal  (+) Arthritis ,  Fibromyalgia -  Abdominal   Peds  Hematology   Anesthesia Other Findings   Reproductive/Obstetrics                              Anesthesia Physical Anesthesia Plan  ASA: 3  Anesthesia Plan: General   Post-op Pain Management:    Induction: Intravenous  PONV Risk Score and Plan: 2 and Ondansetron and Dexamethasone  Airway Management Planned: Oral ETT  Additional Equipment:   Intra-op Plan:   Post-operative Plan: Extubation in OR  Informed Consent: I have reviewed the patients History and Physical, chart, labs and discussed the procedure including the risks, benefits and alternatives for the proposed anesthesia with the patient or authorized representative who has indicated his/her understanding and acceptance.     Dental advisory given  Plan Discussed with: CRNA  Anesthesia Plan  Comments: (PAT note by Antionette Poles, PA-C: 73 year old female follows with cardiology for history of HTN, HLD, noncardiac chest pain, nonobstructive CAD (30% pLAD, 10% RCA on [facility] 02/2022).  Echo 02/2022 showed normal EF 60 to 65%.  Last seen by Dr. Azucena Cecil 04/28/2023.  Hypertension noted to be well-controlled.  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 Etiology likely from smoking", but it does not appear this has been completed yet.  He also referred patient to vascular surgery for evaluation/monitoring of, "Focal outpouching of infrarenal abdominal aorta noted on CT 03/2023."  Vascular surgery reach out to patient, however, does not appear she ever scheduled an appointment.  37-month cardiology follow-up was recommended.  Current smoker x 50 years.  Recent low-dose CT of the chest showed right lower lobe nodule as well as findings consistent with interstitial lung disease.  Subsequent PET/CT was notable for mild FDG avidity in the right lower lobe nodule while high-resolution chest CT showed an enlarging right lower lobe nodule (from 8 to 10 mm).  These findings were felt concerning for malignancy.  Navigational bronchoscopy was recommended.  Other pertinent history includes hereditary hemochromatosis, trigeminal neuralgia, fibromyalgia, migraines, psoriatic arthritis on golimumab as well as prednisone 10 mg daily.  CBC from 07/30/2023 reviewed, WBC mildly elevated at 13.7, H/H mildly elevated at 15.2/47.4 consistent with history of surgery hemochromatosis, otherwise unremarkable.  Patient will need day of surgery labs and evaluation.  EKG 04/28/2023: Normal sinus rhythm.  Rate 90. Possible  Left atrial enlargement. Left axis deviation. Inferior infarct , age undetermined  HRCT chest 07/15/2023: IMPRESSION: 1. Enlarging right lower lobe nodule is highly worrisome for primary bronchogenic carcinoma. These results  will be called to the ordering clinician or representative by the Radiologist Assistant, and communication documented in the PACS or [employer]. 2. Mild basilar predominant subpleural reticulation, ground-glass and bronchiolectasis, findings which may be due to usual interstitial pneumonitis or fibrotic nonspecific interstitial pneumonitis. Findings are categorized as probable UIP per consensus guidelines: Diagnosis of Idiopathic Pulmonary Fibrosis: An Official ATS/ERS/JRS/ALAT Clinical Practice Guideline. Am Rosezetta Schlatter Crit Care Med Vol 198, Iss 5, [phone], Feb 15 2017. 3. Aortic atherosclerosis (ICD10-I70.0). Coronary artery calcification. 4. Enlarged pulmonic trunk, indicative of pulmonary arterial hypertension. 5.  Emphysema (ICD10-J43.9).  TTE 02/24/2022:  1. Left ventricular ejection fraction, by estimation, is 60 to 65%. The  left ventricle has normal function. The left ventricle has no regional  wall motion abnormalities. Left ventricular diastolic parameters were  normal.   2. Right ventricular systolic function is normal. The right ventricular  size is normal. Tricuspid regurgitation signal is inadequate for assessing  PA pressure.   3. The mitral valve is normal in structure. No evidence of mitral valve  regurgitation. No evidence of mitral stenosis.   4. The aortic valve is normal in structure. Aortic valve regurgitation is  not visualized. No aortic stenosis is present.   5. The inferior vena cava is normal in size with greater than 50%  respiratory variability, suggesting right atrial pressure of 3 mmHg.   Cath 02/24/2022:    Prox RCA lesion is 10% stenosed.   Prox LAD to Mid LAD lesion is 30% stenosed.   The left ventricular systolic function is normal.   LV end diastolic pressure is moderately elevated.   The left ventricular ejection fraction is 55-65% by visual estimate.   1.  Mild nonobstructive coronary artery disease. 2.  Normal LV systolic  function and moderately elevated left ventricular end-diastolic pressure.   Recommendations: No culprit is identified for the patient's chest pain and mildly abnormal EKG. If patient's symptoms persist, recommend further work-up for noncardiac chest pain.  )         Anesthesia Quick Evaluation

## 2023-08-20 NOTE — Progress Notes (Signed)
 SDW call  Patient was given pre-op instructions over the phone. Patient verbalized understanding of instructions provided.     PCP - Dr. Shirlee Latch Cardiologist - Dr. Debbe Odea Pulmonary:    PPM/ICD - denies Device Orders - na Rep Notified - na   Chest CT - 2/7/20255 EKG -  04/28/2023 Stress Test - ECHO - 02/25/2022 Cardiac Cath - 02/24/2022  Sleep Study/sleep apnea/CPAP: denies  Non-diabetic  Blood Thinner Instructions: denies Aspirin Instructions:denies   ERAS Protcol - NPO    Anesthesia review: Yes. HTN, MI, heart caths   Patient denies shortness of breath, fever, cough and chest pain over the phone call  Your procedure is scheduled on Thursday August 21, 2023  Report to [facility] Main Entrance "A" at 0530  A.M., then check in with the Admitting office.  Call this number if you have problems the morning of surgery:  [phone]   If you have any questions prior to your surgery date call [phone]: Open Monday-Friday 8am-4pm If you experience any cold or flu symptoms such as cough, fever, chills, shortness of breath, etc. between now and your scheduled surgery, please notify us at the above number    Remember:  Do not eat or drink after midnight the night before your surgery  Take these medicines the morning of surgery with A SIP OF WATER:  Amlodipine, nexlizet cymbalta, lexapro, gabapentin, prednisone  As needed: Astelin, flonase, claritin, robaxin, zofran, percocet  As of today, STOP taking any Aspirin (unless otherwise instructed by your surgeon) Aleve, Naproxen, Ibuprofen, Motrin, Advil, Goody's, BC's, all herbal medications, fish oil, and all vitamins.

## 2023-08-20 NOTE — Progress Notes (Signed)
 Anesthesia Chart Review: Same day workup  73 year old female follows with cardiology for history of HTN, HLD, noncardiac chest pain, nonobstructive CAD (30% pLAD, 10% RCA on [facility] 02/2022).  Echo 02/2022 showed normal EF 60 to 65%.  Last seen by Dr. Azucena Cecil 04/28/2023.  Hypertension noted to be well-controlled.  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 Etiology likely from smoking", but it does not appear this has been completed yet.  He also referred patient to vascular surgery for evaluation/monitoring of, "Focal outpouching of infrarenal abdominal aorta noted on CT 03/2023."  Vascular surgery reach out to patient, however, does not appear she ever scheduled an appointment.  38-month cardiology follow-up was recommended.  Current smoker x 50 years.  Recent low-dose CT of the chest showed right lower lobe nodule as well as findings consistent with interstitial lung disease.  Subsequent PET/CT was notable for mild FDG avidity in the right lower lobe nodule while high-resolution chest CT showed an enlarging right lower lobe nodule (from 8 to 10 mm).  These findings were felt concerning for malignancy.  Navigational bronchoscopy was recommended.  Other pertinent history includes hereditary hemochromatosis, trigeminal neuralgia, fibromyalgia, migraines, psoriatic arthritis on golimumab as well as prednisone 10 mg daily.  CBC from 07/30/2023 reviewed, WBC mildly elevated at 13.7, H/H mildly elevated at 15.2/47.4 consistent with history of surgery hemochromatosis, otherwise unremarkable.  Patient will need day of surgery labs and evaluation.  EKG 04/28/2023: Normal sinus rhythm.  Rate 90. Possible Left atrial enlargement. Left axis deviation. Inferior infarct , age undetermined  HRCT chest 07/15/2023: IMPRESSION: 1. Enlarging right lower lobe nodule is highly worrisome for primary bronchogenic carcinoma. These results  will be called to the ordering clinician or representative by the Radiologist Assistant, and communication documented in the PACS or [employer]. 2. Mild basilar predominant subpleural reticulation, ground-glass and bronchiolectasis, findings which may be due to usual interstitial pneumonitis or fibrotic nonspecific interstitial pneumonitis. Findings are categorized as probable UIP per consensus guidelines: Diagnosis of Idiopathic Pulmonary Fibrosis: An Official ATS/ERS/JRS/ALAT Clinical Practice Guideline. Am Rosezetta Schlatter Crit Care Med Vol 198, Iss 5, [phone], Feb 15 2017. 3. Aortic atherosclerosis (ICD10-I70.0). Coronary artery calcification. 4. Enlarged pulmonic trunk, indicative of pulmonary arterial hypertension. 5.  Emphysema (ICD10-J43.9).  TTE 02/24/2022:  1. Left ventricular ejection fraction, by estimation, is 60 to 65%. The  left ventricle has normal function. The left ventricle has no regional  wall motion abnormalities. Left ventricular diastolic parameters were  normal.   2. Right ventricular systolic function is normal. The right ventricular  size is normal. Tricuspid regurgitation signal is inadequate for assessing  PA pressure.   3. The mitral valve is normal in structure. No evidence of mitral valve  regurgitation. No evidence of mitral stenosis.   4. The aortic valve is normal in structure. Aortic valve regurgitation is  not visualized. No aortic stenosis is present.   5. The inferior vena cava is normal in size with greater than 50%  respiratory variability, suggesting right atrial pressure of 3 mmHg.   Cath 02/24/2022:    Prox RCA lesion is 10% stenosed.   Prox LAD to Mid LAD lesion is 30% stenosed.   The left ventricular systolic function is normal.   LV end diastolic pressure is moderately elevated.   The left ventricular ejection fraction is 55-65% by visual estimate.   1.  Mild nonobstructive coronary artery disease. 2.  Normal LV systolic function  and  moderately elevated left ventricular end-diastolic pressure.   Recommendations: No culprit is identified for the patient's chest pain and mildly abnormal EKG. If patient's symptoms persist, recommend further work-up for noncardiac chest pain.    Zannie Cove [facility] Short Stay Center/Anesthesiology Phone [phone] 08/20/2023 10:08 AM

## 2023-08-20 NOTE — Progress Notes (Signed)
 MRN : [id number]  April Best is a 73 y.o. (03-11-1951) female who presents with chief complaint of check circulation.  History of Present Illness:   The patient presents to the office for evaluation of an abdominal aortic aneurysm. The aneurysm was found incidentally by CT scan.  CT scan is dated March 28, 2023 and was obtained to evaluate the lumbar spine.  It does show a saccular area that appears very focal below the level of the renal arteries with a maximal diameter of 2.9 cm.  Patient denies abdominal pain or unusual back pain, no other abdominal complaints.  No history of an abrupt onset of a painful toe associated with blue discoloration.     No family history of AAA.   The patient notes that she has some chronic numbness of the right face.  She is followed by Dr. Sherryll Burger of [facility] neurology.  She has had an MRI which showed several very small old infarcts.  Patient denies amaurosis fugax or TIA symptoms.   There is no history of claudication or rest pain symptoms of the lower extremities.  The patient denies angina or shortness of breath.  CT scan dated 03/28/2023 shows a saccular AAA that measures 2.8 cm.  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    Past Medical History:  Diagnosis Date   Allergic rhinitis    Allergy    Annular oral lichen planus    Anxiety    Arthritis    Depression    Deviated nasal septum    nasal obstruction   Guaiac positive stools    history   H. 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   ABDOMINOPLASTY     APPENDECTOMY     BREAST BIOPSY Right    neg- core   COLONOSCOPY  03/2006   COLONOSCOPY WITH PROPOFOL N/A 11/19/2017   Procedure: COLONOSCOPY WITH PROPOFOL;  Surgeon: Toledo, Boykin Nearing, MD;  Location: [facility] ENDOSCOPY;  Service: Gastroenterology;  Laterality: N/A;   CORONARY/GRAFT ACUTE MI REVASCULARIZATION N/A 02/24/2022   Procedure: Coronary/Graft Acute MI Revascularization;  Surgeon: Iran Ouch, MD;  Location: [facility] INVASIVE CV LAB;  Service: Cardiovascular;  Laterality: N/A;   ENDOSCOPIC CONCHA BULLOSA RESECTION Right 01/30/2021   Procedure: ENDOSCOPIC CONCHA BULLOSA RESECTION;  Surgeon: Geanie Logan, MD;  Location: [facility];  Service: ENT;  Laterality: Right;   ESOPHAGOGASTRODUODENOSCOPY N/A 03/19/2022   Procedure: ESOPHAGOGASTRODUODENOSCOPY (EGD);  Surgeon: Regis Bill, MD;  Location: [facility] ENDOSCOPY;  Service: Gastroenterology;  Laterality: N/A;   LEFT HEART CATH AND CORONARY ANGIOGRAPHY N/A 02/24/2022   Procedure: LEFT HEART CATH AND CORONARY ANGIOGRAPHY;  Surgeon: Iran Ouch, MD;  Location: [facility] INVASIVE CV LAB;  Service: Cardiovascular;  Laterality: N/A;   RHINOPLASTY  2002   SEPTOPLASTY N/A 01/30/2021   Procedure: SEPTOPLASTY;  Surgeon: Geanie Logan, MD;  Location: [facility];  Service: ENT;  Laterality: N/A;   UPPER GI ENDOSCOPY  2008    Social History Social History   Tobacco Use   Smoking status: Every Day    Current packs/day: 1.00    Average packs/day: 1 pack/day for 54.2 years (54.2 ttl pk-yrs)    Types: Cigarettes    Start date: 1971   Smokeless tobacco: Never   Tobacco comments:    Smoked 1 PPD-  khj 07/30/2023  Vaping Use   Vaping status: Never Used  Substance Use Topics   Alcohol use: Not Currently    Alcohol/week: 0.0 standard drinks of alcohol   Drug use: No    Family History Family History  Problem Relation Age of Onset   Hypertension Mother    Hyperlipidemia Mother    Hypothyroidism Mother    Fibromyalgia Mother    Hearing loss Mother    Lung cancer Father    Coronary artery disease Father    Hyperlipidemia Father    Hypertension Father    Heart attack Father    Hearing loss Sister    Hyperlipidemia Sister    Hypertension Sister    Fibromyalgia Sister    Hemochromatosis Sister    ADD / ADHD Brother    Breast cancer Neg Hx     Allergies  Allergen Reactions   Enbrel [Etanercept] Hives    Other reaction(s): Muscle Cramps   Modafinil Rash   Augmentin  [Amoxicillin-Pot Clavulanate] Nausea And Vomiting    as stated (XR).   Cefdinir Diarrhea and Other (See Comments)    Other reaction(s): GI intolerance, Other (see comments) Gives her Cdiff Gives her Cdiff    Lisinopril Cough     REVIEW OF SYSTEMS (Negative unless checked)  Constitutional: [] Weight loss  [] Fever  [] Chills Cardiac: [] Chest pain   [] Chest pressure   [] Palpitations   [] Shortness of breath when laying flat   [] Shortness of breath with exertion. Vascular:  [x] Pain in legs with walking   [] Pain in legs at rest  [] History of DVT   [] Phlebitis   [] Swelling in legs   [] Varicose veins   [] Non-healing ulcers Pulmonary:   [] Uses home oxygen   [] Productive cough   [] Hemoptysis   [] Wheeze  [] COPD   [] Asthma Neurologic:  [] Dizziness   [] Seizures   [] History of stroke   [] History of TIA  [] Aphasia   [] Vissual changes   [] Weakness or numbness in arm   [] Weakness or numbness in leg Musculoskeletal:   [] Joint swelling   [] Joint pain   [] Low back pain Hematologic:  [] Easy bruising  [] Easy bleeding   [] Hypercoagulable state   [] Anemic Gastrointestinal:  [] Diarrhea   [] Vomiting  [] Gastroesophageal  reflux/heartburn   [] Difficulty swallowing. Genitourinary:  [] Chronic kidney disease   [] Difficult urination  [] Frequent urination   [] Blood in urine Skin:  [] Rashes   [] Ulcers  Psychological:  [] History of anxiety   []  History of major depression.  Physical Examination  There were no vitals filed for this visit. There  is no height or weight on file to calculate BMI. Gen: WD/WN, NAD Head: [place]/AT, No temporalis wasting.  Ear/Nose/Throat: Hearing grossly intact, nares w/o erythema or drainage Eyes: PER, EOMI, sclera nonicteric.  Neck: Supple, no masses.  No bruit or JVD.  Pulmonary:  Good air movement, no audible wheezing, no use of accessory muscles.  Cardiac: RRR, normal S1, S2, no Murmurs. Vascular: Popliteal pulses are palpable and not enlarged, no open wounds Vessel Right Left  Radial Palpable Palpable  PT Not Palpable Not Palpable  DP Not Palpable Not Palpable  Gastrointestinal: soft, non-distended. No guarding/no peritoneal signs.  Musculoskeletal: M/S 5/5 throughout.  No visible deformity.  Neurologic: CN 2-12 intact. Pain and light touch intact in extremities.  Symmetrical.  Speech is fluent. Motor exam as listed above. Psychiatric: Judgment intact, Mood & affect appropriate for pt's clinical situation. Dermatologic: No rashes or ulcers noted.  No changes consistent with cellulitis.   CBC Lab Results  Component Value Date   WBC 13.7 (H) 07/30/2023   HGB 15.2 (H) 07/30/2023   HCT 47.4 (H) 07/30/2023   MCV 100.2 (H) 07/30/2023   PLT 354 07/30/2023    BMET    Component Value Date/Time   NA 140 02/24/2022 0617   NA 146 (H) 08/09/2021 1402   K 3.5 02/24/2022 0617   CL 103 02/24/2022 0617   CO2 30 02/24/2022 0617   GLUCOSE 131 (H) 02/24/2022 0617   BUN 13 02/24/2022 0617   BUN 19 08/09/2021 1402   CREATININE 0.68 02/24/2022 0617   CREATININE 0.57 05/05/2017 1653   CALCIUM 8.8 (L) 02/24/2022 0617   GFRNONAA >60 02/24/2022 0617   GFRNONAA 97 05/05/2017 1653   GFRAA  83 01/20/2020 1327   GFRAA 113 05/05/2017 1653   CrCl cannot be calculated (Patient's most recent lab result is older than the maximum 21 days allowed.).  COAG Lab Results  Component Value Date   INR 1.0 02/24/2022   INR 0.9 01/12/2021    Radiology No results found.   Assessment/Plan 1. Infrarenal abdominal aortic aneurysm (AAA) without rupture (HCC) (Primary) Recommend: No surgery or intervention is indicated at this time.  The patient has an asymptomatic abdominal aortic aneurysm that is less than 4 cm in maximal diameter.    I have reviewed the natural history of abdominal aortic aneurysm and the small risk of rupture for aneurysm less than 5 cm in size.  However, as these small aneurysms tend to enlarge over time, continued surveillance with ultrasound or CT scan is mandatory.   I have also discussed optimizing medical management with hypertension and lipid control and the negative effect that any tobacco products have on aneurysmal disease.  The patient is also encouraged to exercise a minimum of 30 minutes 4 times a week.   Should the patient develop new onset abdominal or back pain or signs of peripheral embolization they are instructed to seek medical attention immediately and to alert the physician providing care that they have an aneurysm.   The patient voices their understanding.  The patient will return in 12 months with an aortic duplex. - VAS US AORTA/IVC/ILIACS; Future  2. PAD (peripheral artery disease) (HCC)  Recommend:  The patient has evidence of atherosclerosis of the lower extremities with claudication.  The patient does not voice lifestyle limiting changes at this point in time.  Noninvasive studies do not suggest clinically significant change.  No invasive studies, angiography or surgery at this time The patient should continue walking and begin a more  formal exercise program.  The patient should continue antiplatelet therapy and aggressive  treatment of the lipid abnormalities  No changes in the patient's medications at this time  Continued surveillance is indicated as atherosclerosis is likely to progress with time.    The patient will continue follow up with noninvasive studies as ordered.  - VAS [place] ABI WITH/WO TBI; Future  3. Cerebrovascular accident (CVA), unspecified mechanism (HCC) Continue antiplatelet therapy and statin therapy per neurology and cardiology.  I will obtain a duplex ultrasound to exclude cervical carotid artery disease as a source. - VAS US CAROTID; Future  4. Essential hypertension Continue antihypertensive medications as already ordered, these medications have been reviewed and there are no changes at this time.  5. Pulmonary emphysema, unspecified emphysema type (HCC) Continue pulmonary medications and aerosols as already ordered, these medications have been reviewed and there are no changes at this time.   6. History of ST elevation myocardial infarction (STEMI) Continue cardiac and antihypertensive medications as already ordered and reviewed, no changes at this time.  Continue statin as ordered and reviewed, no changes at this time  Nitrates PRN for chest pain    Levora Dredge, MD  08/20/2023 11:11 AM

## 2023-08-21 ENCOUNTER — Encounter (HOSPITAL_COMMUNITY)
Admission: RE | Disposition: A | Discharge status: Home / Self Care | Payer: Self-pay | Source: Home / Self Care | Attending: Pulmonary Disease

## 2023-08-21 ENCOUNTER — Other Ambulatory Visit: Payer: Self-pay

## 2023-08-21 ENCOUNTER — Ambulatory Visit (HOSPITAL_COMMUNITY)

## 2023-08-21 ENCOUNTER — Ambulatory Visit (HOSPITAL_COMMUNITY)
Admission: RE | Admit: 2023-08-21 | Discharge: 2023-08-21 | Disposition: A | Discharge status: Home / Self Care | Payer: PPO | Attending: Pulmonary Disease | Admitting: Pulmonary Disease

## 2023-08-21 ENCOUNTER — Encounter (HOSPITAL_COMMUNITY): Payer: Self-pay | Admitting: Pulmonary Disease

## 2023-08-21 ENCOUNTER — Ambulatory Visit (HOSPITAL_COMMUNITY): Payer: Self-pay | Admitting: Physician Assistant

## 2023-08-21 ENCOUNTER — Ambulatory Visit (HOSPITAL_BASED_OUTPATIENT_CLINIC_OR_DEPARTMENT_OTHER): Payer: Self-pay | Admitting: Physician Assistant

## 2023-08-21 DIAGNOSIS — F17210 Nicotine dependence, cigarettes, uncomplicated: Secondary | ICD-10-CM | POA: Insufficient documentation

## 2023-08-21 DIAGNOSIS — Z48813 Encounter for surgical aftercare following surgery on the respiratory system: Secondary | ICD-10-CM | POA: Diagnosis not present

## 2023-08-21 DIAGNOSIS — I739 Peripheral vascular disease, unspecified: Secondary | ICD-10-CM

## 2023-08-21 DIAGNOSIS — F418 Other specified anxiety disorders: Secondary | ICD-10-CM | POA: Diagnosis not present

## 2023-08-21 DIAGNOSIS — I2510 Atherosclerotic heart disease of native coronary artery without angina pectoris: Secondary | ICD-10-CM | POA: Diagnosis not present

## 2023-08-21 DIAGNOSIS — J849 Interstitial pulmonary disease, unspecified: Secondary | ICD-10-CM | POA: Diagnosis not present

## 2023-08-21 DIAGNOSIS — I493 Ventricular premature depolarization: Secondary | ICD-10-CM | POA: Insufficient documentation

## 2023-08-21 DIAGNOSIS — I10 Essential (primary) hypertension: Secondary | ICD-10-CM | POA: Insufficient documentation

## 2023-08-21 DIAGNOSIS — J449 Chronic obstructive pulmonary disease, unspecified: Secondary | ICD-10-CM

## 2023-08-21 DIAGNOSIS — E785 Hyperlipidemia, unspecified: Secondary | ICD-10-CM | POA: Diagnosis not present

## 2023-08-21 DIAGNOSIS — F419 Anxiety disorder, unspecified: Secondary | ICD-10-CM | POA: Diagnosis not present

## 2023-08-21 DIAGNOSIS — I700 Atherosclerosis of aorta: Secondary | ICD-10-CM | POA: Diagnosis not present

## 2023-08-21 DIAGNOSIS — F32A Depression, unspecified: Secondary | ICD-10-CM | POA: Insufficient documentation

## 2023-08-21 DIAGNOSIS — J9811 Atelectasis: Secondary | ICD-10-CM | POA: Diagnosis not present

## 2023-08-21 DIAGNOSIS — Z7952 Long term (current) use of systemic steroids: Secondary | ICD-10-CM | POA: Insufficient documentation

## 2023-08-21 DIAGNOSIS — L4050 Arthropathic psoriasis, unspecified: Secondary | ICD-10-CM | POA: Diagnosis not present

## 2023-08-21 DIAGNOSIS — R911 Solitary pulmonary nodule: Secondary | ICD-10-CM | POA: Insufficient documentation

## 2023-08-21 HISTORY — PX: BRONCHIAL WASHINGS: SHX5105

## 2023-08-21 HISTORY — PX: ENDOBRONCHIAL ULTRASOUND: SHX5096

## 2023-08-21 HISTORY — PX: BRONCHIAL BRUSHINGS: SHX5108

## 2023-08-21 HISTORY — PX: BRONCHIAL NEEDLE ASPIRATION BIOPSY: SHX5106

## 2023-08-21 LAB — BASIC METABOLIC PANEL
Anion gap: 10 (ref 5–15)
BUN: 38 mg/dL — ABNORMAL HIGH (ref 8–23)
CO2: 24 mmol/L (ref 22–32)
Calcium: 9 mg/dL (ref 8.9–10.3)
Chloride: 106 mmol/L (ref 98–111)
Creatinine, Ser: 0.96 mg/dL (ref 0.44–1.00)
GFR, Estimated: >60 mL/min (ref >60)
Glucose, Bld: 113 mg/dL — ABNORMAL HIGH (ref 70–99)
Potassium: 4.9 mmol/L (ref 3.5–5.1)
Sodium: 140 mmol/L (ref 135–145)

## 2023-08-21 SURGERY — BRONCHOSCOPY, WITH BIOPSY USING ELECTROMAGNETIC NAVIGATION
Anesthesia: General | Laterality: Bilateral

## 2023-08-21 MED ORDER — CHLORHEXIDINE GLUCONATE 0.12 % MT SOLN
OROMUCOSAL | Status: AC
Start: 2023-08-21 — End: 2023-08-21
  Administered 2023-08-21: 15 mL via OROMUCOSAL
  Filled 2023-08-21: qty 15

## 2023-08-21 MED ORDER — LACTATED RINGERS IV SOLN: INTRAVENOUS | Status: DC

## 2023-08-21 MED ORDER — SUGAMMADEX SODIUM 200 MG/2ML IV SOLN
INTRAVENOUS | Status: DC | PRN
  Administered 2023-08-21: 200 mg via INTRAVENOUS

## 2023-08-21 MED ORDER — FENTANYL CITRATE (PF) 100 MCG/2ML IJ SOLN
INTRAMUSCULAR | Status: AC
  Filled 2023-08-21: qty 2

## 2023-08-21 MED ORDER — PROPOFOL 10 MG/ML IV BOLUS
INTRAVENOUS | Status: DC | PRN
  Administered 2023-08-21: 100 ug/kg/min via INTRAVENOUS
  Administered 2023-08-21 (×2): 40 mg via INTRAVENOUS
  Administered 2023-08-21: 100 mg via INTRAVENOUS

## 2023-08-21 MED ORDER — DEXAMETHASONE SODIUM PHOSPHATE 10 MG/ML IJ SOLN
INTRAMUSCULAR | Status: DC | PRN
  Administered 2023-08-21: 10 mg via INTRAVENOUS

## 2023-08-21 MED ORDER — ONDANSETRON HCL 4 MG/2ML IJ SOLN: 4.0000 mg | Freq: Once | INTRAMUSCULAR | Status: DC | PRN

## 2023-08-21 MED ORDER — LIDOCAINE 2% (20 MG/ML) 5 ML SYRINGE
INTRAMUSCULAR | Status: DC | PRN
  Administered 2023-08-21: 100 mg via INTRAVENOUS

## 2023-08-21 MED ORDER — CHLORHEXIDINE GLUCONATE 0.12 % MT SOLN: 15.0000 mL | Freq: Once | OROMUCOSAL | Status: AC

## 2023-08-21 MED ORDER — PHENYLEPHRINE HCL-NACL 20-0.9 MG/250ML-% IV SOLN
INTRAVENOUS | Status: DC | PRN
  Administered 2023-08-21: 30 ug/min via INTRAVENOUS

## 2023-08-21 MED ORDER — ROCURONIUM BROMIDE 10 MG/ML (PF) SYRINGE
PREFILLED_SYRINGE | INTRAVENOUS | Status: DC | PRN
  Administered 2023-08-21: 50 mg via INTRAVENOUS
  Administered 2023-08-21 (×2): 10 mg via INTRAVENOUS

## 2023-08-21 MED ORDER — OXYCODONE HCL 5 MG PO TABS: 5.0000 mg | ORAL_TABLET | Freq: Once | ORAL | Status: DC | PRN

## 2023-08-21 MED ORDER — ONDANSETRON HCL 4 MG/2ML IJ SOLN
INTRAMUSCULAR | Status: DC | PRN
  Administered 2023-08-21: 4 mg via INTRAVENOUS

## 2023-08-21 MED ORDER — FENTANYL CITRATE (PF) 100 MCG/2ML IJ SOLN: 25.0000 ug | INTRAMUSCULAR | Status: DC | PRN

## 2023-08-21 MED ORDER — ACETAMINOPHEN 10 MG/ML IV SOLN: 1000.0000 mg | Freq: Once | INTRAVENOUS | Status: DC | PRN

## 2023-08-21 MED ORDER — FENTANYL CITRATE (PF) 250 MCG/5ML IJ SOLN
INTRAMUSCULAR | Status: DC | PRN
  Administered 2023-08-21 (×2): 50 ug via INTRAVENOUS

## 2023-08-21 MED ORDER — OXYCODONE HCL 5 MG/5ML PO SOLN: 5.0000 mg | Freq: Once | ORAL | Status: DC | PRN

## 2023-08-21 NOTE — Interval H&P Note (Signed)
 Patient is here for RANB EBUS TBNA. Adequate for procedure.   Janann Colonel, MD [facility] 08/21/2023 7:32 AM

## 2023-08-21 NOTE — Anesthesia Postprocedure Evaluation (Signed)
 Anesthesia Post Note  Patient: April Best  Procedure(s) Performed: BRONCHOSCOPY, WITH BIOPSY USING ELECTROMAGNETIC NAVIGATION (Bilateral) BRONCHOSCOPY, WITH BRUSH BIOPSY BRONCHOSCOPY, WITH NEEDLE ASPIRATION BIOPSY IRRIGATION, BRONCHUS ENDOBRONCHIAL ULTRASOUND (EBUS) (Bilateral)     Patient location during evaluation: PACU Anesthesia Type: General Level of consciousness: awake and alert Pain management: pain level controlled Vital Signs Assessment: post-procedure vital signs reviewed and stable Respiratory status: spontaneous breathing, nonlabored ventilation, respiratory function stable and patient connected to nasal cannula oxygen Cardiovascular status: blood pressure returned to baseline and stable Postop Assessment: no apparent nausea or vomiting Anesthetic complications: no   No notable events documented.  Last Vitals:  Vitals:   08/21/23 0930 08/21/23 0945  BP: 127/74 135/78  Pulse: 76 77  Resp: (!) 21 14  Temp:  36.4 C  SpO2: 90% 94%    Last Pain:  Vitals:   08/21/23 0945  TempSrc:   PainSc: 0-No pain                 Mariann Barter

## 2023-08-21 NOTE — Op Note (Signed)
 Video Bronchoscopy with Robotic Assisted Bronchoscopic Navigation   Date of Operation: 08/21/2023   Pre-op Diagnosis: RLL Nodule  Post-op Diagnosis: RLL Nodule  Surgeon: Stiven Kaspar  Anesthesia: General endotracheal anesthesia  Operation: Flexible video fiberoptic bronchoscopy with robotic assistance and biopsies.  Estimated Blood Loss: Minimal  Complications: None  Indications and History: April Best is a 73 y.o. female with history of RLL Nodule. The risks, benefits, complications, treatment options and expected outcomes were discussed with the patient.  The possibilities of pneumothorax, pneumonia, reaction to medication, pulmonary aspiration, perforation of a viscus, bleeding, failure to diagnose a condition and creating a complication requiring transfusion or operation were discussed with the patient who freely signed the consent.    Description of Procedure: The patient was seen in the Preoperative Area, was examined and was deemed appropriate to proceed.  The patient was taken to [facility] endoscopy room 3, identified as April Best and the procedure verified as Flexible Video Fiberoptic Bronchoscopy.  A Time Out was held and the above information confirmed.   Prior to the date of the procedure a high-resolution CT scan of the chest was performed. Utilizing ION software program a virtual tracheobronchial tree was generated to allow the creation of distinct navigation pathways to the patient's parenchymal abnormalities. After being taken to the operating room general anesthesia was initiated and the patient  was orally intubated. The video fiberoptic bronchoscope was introduced via the endotracheal tube and a general inspection was performed which showed normal right and left lung anatomy, aspiration of the bilateral mainstems was completed to remove any remaining secretions. Robotic catheter inserted into patient's endotracheal tube.   Target #1 RLL Nodule: The distinct navigation  pathways prepared prior to this procedure were then utilized to navigate to patient's lesion identified on CT scan. The robotic catheter was secured into place and the vision probe was withdrawn.  Lesion location was approximated using fluoroscopy and radial endobronchial ultrasound for peripheral targeting. Under fluoroscopic guidance transbronchial needle brushings and transbronchial needle biopsies were performed to be sent for cytology and pathology. A bronchioalveolar lavage was performed in the RLL and sent for cytology.  We then proceeded with EBUS surveu and 4L, 7 and 4R were evaluated and noted to be benign, less than 0.5cm with hyaline central structure.  At the end of the procedure a general airway inspection was performed and there was no evidence of active bleeding. The bronchoscope was removed.  The patient tolerated the procedure well. There was no significant blood loss and there were no obvious complications. A post-procedural chest x-ray is pending.  Samples Target #1: 1. Transbronchial needle brushings from RLL Nodule 2. Transbronchial needle biopsies from RLL Nodule 3. Bronchoalveolar lavage from RLL  Plans:  The patient will be discharged from the PACU to home when recovered from anesthesia and after chest x-ray is reviewed. We will review the cytology, pathology and microbiology results with the patient when they become available. Outpatient followup will be with Dr. Octavio Graves, MD [place] [facility] 08/21/2023 9:17 AM  .

## 2023-08-21 NOTE — Anesthesia Procedure Notes (Signed)
 Procedure Name: Intubation Date/Time: 08/21/2023 7:40 AM  Performed by: Kayleen Memos, CRNAPre-anesthesia Checklist: Patient identified, Emergency Drugs available, Suction available and Patient being monitored Patient Re-evaluated:Patient Re-evaluated prior to induction Oxygen Delivery Method: Circle System Utilized Preoxygenation: Pre-oxygenation with 100% oxygen Induction Type: IV induction Ventilation: Mask ventilation without difficulty Laryngoscope Size: Mac and 3 Grade View: Grade I Tube type: Oral Tube size: 8.5 mm Number of attempts: 1 Airway Equipment and Method: Stylet Placement Confirmation: ETT inserted through vocal cords under direct vision, positive ETCO2 and breath sounds checked- equal and bilateral Secured at: 22 cm Tube secured with: Tape Dental Injury: Teeth and Oropharynx as per pre-operative assessment

## 2023-08-21 NOTE — Transfer of Care (Signed)
 Immediate Anesthesia Transfer of Care Note  Patient: Aldean Ast  Procedure(s) Performed: BRONCHOSCOPY, WITH BIOPSY USING ELECTROMAGNETIC NAVIGATION (Bilateral) BRONCHOSCOPY, WITH BRUSH BIOPSY BRONCHOSCOPY, WITH NEEDLE ASPIRATION BIOPSY IRRIGATION, BRONCHUS  Patient Location: PACU  Anesthesia Type:General  Level of Consciousness: awake, alert , and oriented  Airway & Oxygen Therapy: Patient Spontanous Breathing and Patient connected to face mask oxygen  Post-op Assessment: Report given to RN, Post -op Vital signs reviewed and stable, and Patient moving all extremities X 4  Post vital signs: Reviewed and stable  Last Vitals:  Vitals Value Taken Time  BP 143/69   Temp    Pulse 79 08/21/23 0921  Resp 20 08/21/23 0921  SpO2 97 % 08/21/23 0921  Vitals shown include unfiled device data.  Last Pain:  Vitals:   08/21/23 0629  TempSrc:   PainSc: 6       Patients Stated Pain Goal: 3 (08/21/23 0629)  Complications: No notable events documented.

## 2023-08-22 LAB — CYTOLOGY - NON PAP

## 2023-08-24 ENCOUNTER — Encounter (HOSPITAL_COMMUNITY): Payer: Self-pay | Admitting: Pulmonary Disease

## 2023-08-25 ENCOUNTER — Encounter (INDEPENDENT_AMBULATORY_CARE_PROVIDER_SITE_OTHER): Payer: Self-pay | Admitting: Vascular Surgery

## 2023-08-25 ENCOUNTER — Ambulatory Visit (INDEPENDENT_AMBULATORY_CARE_PROVIDER_SITE_OTHER): Payer: Self-pay | Admitting: Vascular Surgery

## 2023-08-25 ENCOUNTER — Encounter: Payer: Self-pay | Admitting: Internal Medicine

## 2023-08-25 VITALS — BP 131/88 | HR 90 | Resp 16 | Wt 113.6 lb

## 2023-08-25 DIAGNOSIS — J439 Emphysema, unspecified: Secondary | ICD-10-CM

## 2023-08-25 DIAGNOSIS — I7143 Infrarenal abdominal aortic aneurysm, without rupture: Secondary | ICD-10-CM

## 2023-08-25 DIAGNOSIS — I252 Old myocardial infarction: Secondary | ICD-10-CM | POA: Diagnosis not present

## 2023-08-25 DIAGNOSIS — I639 Cerebral infarction, unspecified: Secondary | ICD-10-CM | POA: Diagnosis not present

## 2023-08-25 DIAGNOSIS — I739 Peripheral vascular disease, unspecified: Secondary | ICD-10-CM | POA: Diagnosis not present

## 2023-08-25 DIAGNOSIS — I10 Essential (primary) hypertension: Secondary | ICD-10-CM

## 2023-08-26 ENCOUNTER — Ambulatory Visit: Payer: PPO | Attending: Student in an Organized Health Care Education/Training Program

## 2023-08-26 DIAGNOSIS — M069 Rheumatoid arthritis, unspecified: Secondary | ICD-10-CM | POA: Diagnosis not present

## 2023-08-26 DIAGNOSIS — R062 Wheezing: Secondary | ICD-10-CM | POA: Diagnosis not present

## 2023-08-26 DIAGNOSIS — R059 Cough, unspecified: Secondary | ICD-10-CM | POA: Insufficient documentation

## 2023-08-26 DIAGNOSIS — F17210 Nicotine dependence, cigarettes, uncomplicated: Secondary | ICD-10-CM | POA: Diagnosis not present

## 2023-08-26 DIAGNOSIS — J449 Chronic obstructive pulmonary disease, unspecified: Secondary | ICD-10-CM | POA: Diagnosis not present

## 2023-08-26 DIAGNOSIS — R911 Solitary pulmonary nodule: Secondary | ICD-10-CM | POA: Diagnosis not present

## 2023-08-26 LAB — PULMONARY FUNCTION TEST ARMC ONLY
DL/VA % pred: 91 %
DL/VA: 3.93 ml/min/mmHg/L
DLCO unc % pred: 84 %
DLCO unc: 13.68 ml/min/mmHg
FEF 25-75 Post: 1.59 L/s
FEF 25-75 Pre: 1.12 L/s
FEF2575-%Change-Post: 42 %
FEF2575-%Pred-Post: 101 %
FEF2575-%Pred-Pre: 70 %
FEV1-%Change-Post: 12 %
FEV1-%Pred-Post: 77 %
FEV1-%Pred-Pre: 68 %
FEV1-Post: 1.37 L
FEV1-Pre: 1.22 L
FEV1FVC-%Change-Post: 2 %
FEV1FVC-%Pred-Pre: 100 %
FEV6-%Change-Post: 9 %
FEV6-%Pred-Post: 77 %
FEV6-%Pred-Pre: 71 %
FEV6-Post: 1.76 L
FEV6-Pre: 1.6 L
FEV6FVC-%Pred-Post: 105 %
FEV6FVC-%Pred-Pre: 105 %
FVC-%Change-Post: 10 %
FVC-%Pred-Post: 74 %
FVC-%Pred-Pre: 67 %
FVC-Post: 1.76 L
FVC-Pre: 1.6 L
Post FEV1/FVC ratio: 78 %
Post FEV6/FVC ratio: 100 %
Pre FEV1/FVC ratio: 76 %
Pre FEV6/FVC Ratio: 100 %
RV % pred: 131 %
RV: 2.57 L
TLC % pred: 102 %
TLC: 4.4 L

## 2023-08-26 MED ORDER — ALBUTEROL SULFATE (2.5 MG/3ML) 0.083% IN NEBU
2.5000 mg | INHALATION_SOLUTION | Freq: Once | RESPIRATORY_TRACT | Status: AC
  Filled 2023-08-26: qty 3

## 2023-08-26 MED ORDER — ALBUTEROL SULFATE (2.5 MG/3ML) 0.083% IN NEBU
2.5000 mg | INHALATION_SOLUTION | Freq: Once | RESPIRATORY_TRACT | Status: AC
  Administered 2023-08-26: 2.5 mg via RESPIRATORY_TRACT
  Filled 2023-08-26: qty 3

## 2023-08-27 ENCOUNTER — Ambulatory Visit (INDEPENDENT_AMBULATORY_CARE_PROVIDER_SITE_OTHER): Payer: PPO | Admitting: Student in an Organized Health Care Education/Training Program

## 2023-08-27 ENCOUNTER — Encounter: Payer: Self-pay | Admitting: Student in an Organized Health Care Education/Training Program

## 2023-08-27 ENCOUNTER — Other Ambulatory Visit
Admission: RE | Admit: 2023-08-27 | Discharge: 2023-08-27 | Disposition: A | Discharge status: Home / Self Care | Source: Ambulatory Visit | Attending: Student in an Organized Health Care Education/Training Program | Admitting: Student in an Organized Health Care Education/Training Program

## 2023-08-27 VITALS — BP 120/66 | HR 92 | Temp 98.3°F | Ht 59.0 in | Wt 116.8 lb

## 2023-08-27 DIAGNOSIS — F17210 Nicotine dependence, cigarettes, uncomplicated: Secondary | ICD-10-CM | POA: Diagnosis not present

## 2023-08-27 DIAGNOSIS — J849 Interstitial pulmonary disease, unspecified: Secondary | ICD-10-CM

## 2023-08-27 DIAGNOSIS — J449 Chronic obstructive pulmonary disease, unspecified: Secondary | ICD-10-CM | POA: Diagnosis not present

## 2023-08-27 DIAGNOSIS — R911 Solitary pulmonary nodule: Secondary | ICD-10-CM | POA: Diagnosis not present

## 2023-08-27 MED ORDER — STIOLTO RESPIMAT 2.5-2.5 MCG/ACT IN AERS: 2.0000 | INHALATION_SPRAY | Freq: Every day | RESPIRATORY_TRACT | 12 refills | Status: DC

## 2023-08-27 NOTE — Patient Instructions (Signed)
 Today, I ordered blood work. You can get them draw at your preferred [facility] draw station. The nearest one to [facility] is at nearby [facility] ([address] [place], [place], [place] [address]).

## 2023-08-29 ENCOUNTER — Encounter: Payer: Self-pay | Admitting: Student in an Organized Health Care Education/Training Program

## 2023-08-29 LAB — RHEUMATOID FACTOR: Rheumatoid fact SerPl-aCnc: <10 [IU]/mL (ref <14.0)

## 2023-08-29 NOTE — Progress Notes (Signed)
 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 She does follow with rheumatology for psoriatic arthritis. Auto-immune workup shows a previously positive ANA (1:1280) in 2021 and anti-CCP (27) in 2021. Myomarker panel was notable for a mildly positive anti-MDA-5 antibody (21 units, <20 is negative). She is currently maintained on Golimumab for psoriatic arthritis.  The differential diagnosis includes CTD-ILD, with a differential of RA (rheumatologist impression that previous anti CCP was false positive) vs mixed connective tissue disease vs MDA-5 associated ILD. Other differentials include NSIP, DIP, and IPF. Patient remains with minimal respiratory symptoms, and does not report shortness of breath or cough. She is maintained on Golimumab and followed by Dr. Allena Katz at [facility].   I will present April Best's case during an upcoming multi-disciplinary conference. I will also repeat her PFT's, and repeat the myomarker panel to assess the anti-MDA-5 antibody for positivity.   -will repeat MDA-5, RF, and anti-CCP antibodies -will discuss in multi-disciplinary ILD board -PFT's ordered -smoking cessation recommended  #Lung nodule  Nodule Location: RLL Nodule Size Nodule Spiculation: No Associated Lymphadenopathy: No Smoking Status (current) and pack years: 55 Extrathoracic cancer > 5 years prior (no) SPN malignancy risk score [facility]): 12 %risk of malignancy ECOG: 0   Patient was noted to have a right lower lobe nodule on a low-dose CT scan of the chest performed for lung cancer screening.  This CT scan was also notable for findings consistent with interstitial lung disease.  Following our previous visit, we decided to proceed with repeating the CT scan of the chest (in high-resolution) and also obtain a PET/CT to assess for FDG  avidity.  PET/CT was notable for mild FDG avidity in the right lower lobe nodule while high-resolution chest CT showed an enlarging right lower lobe nodule (from 8 to 10 mm). Given concern for malignancy, patient underwent robotic assisted navigational bronchoscopy to the RLL nodule with Dr. Larinda Buttery with biopsy returning negative for malignancy.  The differential for the nodule includes malignancy as well as benign/inflammatory causes (infectious, rheumatoid nodule, confluence of inflammation secondary to ILD). I have also reviewed the images and note a blood vessel leading to the pulmonary nodule, with AVM possible as an etiology behind the nodule. I will further work this nodule up with a CT chest with contrast to evaluate for AVM. Should this be negative, will consider proceeding with a CT guided biopsy vs watchful waiting.  -CT Angiogram of the chest  #PRiSM  PFT's show mild obstruction with normal FEV1/FVC and bronchodilator response. 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 Will initiate her on LABA/LAMA therapy and re-assess symptoms on follow up.  - Tiotropium Bromide-Olodaterol (STIOLTO RESPIMAT) 2.5-2.5 MCG/ACT AERS; Inhale 2 puffs into the lungs daily.  Dispense: 60 each; Refill: 12   Return in about 2 months (around 10/27/2023).  I spent 30 minutes caring for this patient today, including preparing to see the patient, obtaining a medical history , reviewing a separately obtained history, performing a medically appropriate examination and/or evaluation, counseling and educating the patient/family/caregiver, ordering medications, tests, or procedures, documenting clinical information in the electronic health record, and independently interpreting results (not separately reported/billed) and communicating results to the patient/family/caregiver  [name], MD [facility] 08/29/2023 1:29 PM    End of visit  medications:  Meds ordered this encounter  Medications   Tiotropium Bromide-Olodaterol (STIOLTO RESPIMAT) 2.5-2.5 MCG/ACT AERS    Sig: Inhale 2 puffs into the lungs daily.    Dispense:  60 each    Refill:  12     Current Outpatient Medications:    amLODipine (NORVASC) 5 MG tablet, TAKE 1 TABLET BY MOUTH DAILY, Disp: 90 tablet, Rfl: 1   azelastine (ASTELIN) 0.1 % nasal spray, Place 1 spray into both nostrils daily as needed., Disp: , Rfl:    Bempedoic Acid-Ezetimibe (NEXLIZET) 180-10 MG TABS, Take 1 tablet by mouth daily., Disp: 30 tablet, Rfl: 3   clobetasol ointment (TEMOVATE) 0.05 %, APP TOPICALLY TO GLUTEAL AREA ONCE D PRF DERMATITIS, Disp: 30 g, Rfl: 1   diclofenac Sodium (VOLTAREN) 1 % GEL, Apply 2 g topically 4 (four) times daily., Disp: , Rfl:    DULoxetine (CYMBALTA) 20 MG capsule, Cymbalta 20 mg capsule,delayed release  Take 1 capsule twice a day by oral route., Disp: , Rfl:    escitalopram (LEXAPRO) 20 MG tablet, TAKE ONE TABLET BY MOUTH EVERY DAY, Disp: 90 tablet, Rfl: 1   fluticasone (FLONASE) 50 MCG/ACT nasal spray, Place 2 sprays into both nostrils as needed for allergies or rhinitis., Disp: , Rfl:    gabapentin (NEURONTIN) 100 MG capsule, Take 100 mg by mouth 2 (two) times daily as needed., Disp: , Rfl:    gabapentin (NEURONTIN) 300 MG capsule, Take 300 mg by mouth as needed (qhs). PRN, Disp: , Rfl:    gentamicin ointment (GARAMYCIN) 0.1 %, Apply 1 application  topically 3 (three) times daily., Disp: , Rfl:    Golimumab (SIMPONI ARIA IV), Inject into the vein every 8 (eight) weeks. Infusion for RA., Disp: , Rfl:    Golimumab (SIMPONI ARIA IV), Inject into the vein every 8 (eight) weeks., Disp: , Rfl:    loratadine (CLARITIN) 10 MG tablet, Take 10 mg by mouth daily., Disp: , Rfl:    losartan (COZAAR) 25 MG tablet, TAKE 1 TABLET BY MOUTH DAILY, Disp: 90 tablet, Rfl: 1   methocarbamol (ROBAXIN) 750 MG tablet, Take 750 mg by mouth every 8 (eight) hours as needed for muscle  spasms., Disp: , Rfl:    nitroGLYCERIN (NITROSTAT) 0.4 MG SL tablet, Place 1 tablet (0.4 mg total) under the tongue every 5 (five) minutes as needed for chest pain., Disp: 30 tablet, Rfl: 12   ondansetron (ZOFRAN-ODT) 4 MG disintegrating tablet, Take 1 tablet (4 mg total) by mouth every 8 (eight) hours as needed for nausea or vomiting., Disp: 20 tablet, Rfl: 0   oxyCODONE-acetaminophen (PERCOCET) 10-325 MG tablet, Take 1 tablet by mouth every 4 (four) hours as needed for pain., Disp: , Rfl:    potassium chloride (KLOR-CON M) 10 MEQ tablet, TAKE TWO TABLETS BY MOUTH EVERY DAY, Disp: 60 tablet, Rfl: 1   predniSONE (DELTASONE) 10 MG tablet, Take 1 tablet by mouth daily., Disp: , Rfl:    Tiotropium Bromide-Olodaterol (STIOLTO RESPIMAT) 2.5-2.5 MCG/ACT AERS, Inhale 2 puffs into the lungs daily., Disp: 60 each, Rfl: 12   traZODone (DESYREL) 100 MG tablet, TAKE 0.5-1 TABLET BY MOUTH AT BEDTIME. (Patient taking differently: Take 100 mg by mouth at bedtime as needed for sleep.), Disp: 30 tablet, Rfl: 5   triamcinolone cream (KENALOG) 0.1 %, Apply topically 2 (two) times daily as needed., Disp: , Rfl:  No current facility-administered medications for this visit.  Facility-Administered Medications Ordered in Other Visits:    albuterol (PROVENTIL) (2.5 MG/3ML) 0.083% nebulizer solution 2.5 mg, 2.5 mg, Nebulization, Once, Stefan Markarian,  Lianne Bushy, MD   Subjective:   PATIENT ID: April Best GENDER: female DOB: 11-Jan-1951, MRN: [id number]  Chief Complaint  Patient presents with   Follow-up    No concerns.    HPI  Ms. Barse is a pleasant 73 year old female with a past medical history of psoriatic arthritis followed by Dr. Allena Katz from rheumatology presenting for follow up of ILD as well as pulmonary nodule.   She remains in her usual state of health, with unchanged symptoms. She recovered well post robotic assisted navigational bronchoscopy. She did report coughing up blood a couple of times but was otherwise  without symptoms. She denies shortness of breath at rest or with exertion. She has a minimal cough, but no sputum production. No fevers or chills reported. No chest pain or chest tightness. Overall, she remains in her usual state of health.   Patient has a history of Psoriatic arthritis followed by Dr. Allena Katz from [facility]. She's currently maintained on Simponi Aria (Golimumab) and has tolerated her infusions without recent flares. She is also maintained on low dose prednisone at 10 mg daily for joint aches. She was previously on methotrexate, and etanercept. She previously had a positive anti-CCP antibody felt to be a false positive by her rheumatologist, as well as a positive ANA. She reports multiple joint pains and joint aches. She also has a history of CAD followed by cardiology    Patient has a long standing history of smoking, and currently continues to smoke. Given this, she was enrolled in our lung cancer screening program. A LDCT was performed on 04/29/2023 showing findings concerning for interstitial lung disease as well as a right lower lobe nodule.   She has a history of smoking, currently smoking 1 pack a day. She has around 55 pack years of smoking history. Denies occupational exposures.  Ancillary information including prior medications, full medical/surgical/family/social histories, and PFTs (when available) are listed below and have been reviewed.   Review of Systems  Constitutional:  Negative for chills, fever and weight loss.  Respiratory:  Negative for cough, hemoptysis, sputum production, shortness of breath and wheezing.   Cardiovascular:  Negative for chest pain and palpitations.     Objective:   Vitals:   08/27/23 1344  BP: 120/66  Pulse: 92  Temp: 98.3 F (36.8 C)  TempSrc: Oral  SpO2: 94%  Weight: 116 lb 12.8 oz (53 kg)  Height: 4\' 11"  (1.499 m)   94% on RA BMI Readings from Last 3 Encounters:  08/27/23 23.59 kg/m  08/25/23 22.94 kg/m  08/21/23  23.43 kg/m   Wt Readings from Last 3 Encounters:  08/27/23 116 lb 12.8 oz (53 kg)  08/25/23 113 lb 9.6 oz (51.5 kg)  08/21/23 116 lb (52.6 kg)    Physical Exam Constitutional:      Appearance: Normal appearance.  Cardiovascular:     Rate and Rhythm: Normal rate and regular rhythm.     Pulses: Normal pulses.     Heart sounds: Normal heart sounds.  Pulmonary:     Effort: Pulmonary effort is normal.     Breath sounds: Rales (bibasilar) present.  Neurological:     General: No focal deficit present.     Mental Status: She is alert and oriented to person, place, and time. Mental status is at baseline.       Ancillary Information    Past Medical History:  Diagnosis Date   Allergic rhinitis    Allergy    Annular oral lichen planus  Anxiety    Arthritis    Depression    Deviated nasal septum    nasal obstruction   Guaiac positive stools    history   H. 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   History of palpitations    Hyperlipidemia    Hypertension    Migraine    Myocardial infarction (HCC)    Osteoarthritis    Osteoporosis    Panic disorder    Psoriasis    Psoriatic arthritis (HCC)    Rheumatoid arteritis (HCC)    Tinnitus      Family History  Problem Relation Age of Onset   Hypertension Mother    Hyperlipidemia Mother    Hypothyroidism Mother    Fibromyalgia Mother    Hearing loss Mother    Lung cancer Father    Coronary artery disease Father    Hyperlipidemia Father    Hypertension Father    Heart attack Father    Hearing loss Sister    Hyperlipidemia Sister    Hypertension Sister    Fibromyalgia Sister    Hemochromatosis Sister    ADD / ADHD Brother    Breast cancer Neg Hx      Past Surgical History:  Procedure Laterality Date   ABDOMINAL HYSTERECTOMY  1983   Ovaries have been removed.   ABDOMINOPLASTY     APPENDECTOMY     BREAST BIOPSY Right    neg- core   BRONCHIAL BRUSHINGS  08/21/2023    Procedure: BRONCHOSCOPY, WITH BRUSH BIOPSY;  Surgeon: Janann Colonel, MD;  Location: [facility];  Service: Pulmonary;;   BRONCHIAL NEEDLE ASPIRATION BIOPSY  08/21/2023   Procedure: BRONCHOSCOPY, WITH NEEDLE ASPIRATION BIOPSY;  Surgeon: Janann Colonel, MD;  Location: [facility];  Service: Pulmonary;;   BRONCHIAL WASHINGS  08/21/2023   Procedure: IRRIGATION, BRONCHUS;  Surgeon: Janann Colonel, MD;  Location: [facility];  Service: Pulmonary;;   COLONOSCOPY  03/2006   COLONOSCOPY WITH PROPOFOL N/A 11/19/2017   Procedure: COLONOSCOPY WITH PROPOFOL;  Surgeon: Toledo, Boykin Nearing, MD;  Location: [facility] ENDOSCOPY;  Service: Gastroenterology;  Laterality: N/A;   CORONARY/GRAFT ACUTE MI REVASCULARIZATION N/A 02/24/2022   Procedure: Coronary/Graft Acute MI Revascularization;  Surgeon: Iran Ouch, MD;  Location: [facility] INVASIVE CV LAB;  Service: Cardiovascular;  Laterality: N/A;   ENDOBRONCHIAL ULTRASOUND Bilateral 08/21/2023   Procedure: ENDOBRONCHIAL ULTRASOUND (EBUS);  Surgeon: Janann Colonel, MD;  Location: [facility] ENDOSCOPY;  Service: Pulmonary;  Laterality: Bilateral;   ENDOSCOPIC CONCHA BULLOSA RESECTION Right 01/30/2021   Procedure: ENDOSCOPIC CONCHA BULLOSA RESECTION;  Surgeon: Geanie Logan, MD;  Location: [facility];  Service: ENT;  Laterality: Right;   ESOPHAGOGASTRODUODENOSCOPY N/A 03/19/2022   Procedure: ESOPHAGOGASTRODUODENOSCOPY (EGD);  Surgeon: Regis Bill, MD;  Location: [facility] ENDOSCOPY;  Service: Gastroenterology;  Laterality: N/A;   LEFT HEART CATH AND CORONARY ANGIOGRAPHY N/A 02/24/2022   Procedure: LEFT HEART CATH AND CORONARY ANGIOGRAPHY;  Surgeon: Iran Ouch, MD;  Location: [facility] INVASIVE CV LAB;  Service: Cardiovascular;  Laterality: N/A;   RHINOPLASTY  2002   SEPTOPLASTY N/A 01/30/2021   Procedure: SEPTOPLASTY;  Surgeon: Geanie Logan, MD;  Location: [facility];  Service: ENT;  Laterality: N/A;   UPPER GI ENDOSCOPY  2008    Social  History   Socioeconomic History   Marital status: Married    Spouse name: Not on file   Number of children: 1   Years of education: Not on file   Highest education level: High school graduate  Occupational History   Occupation: retired  Tobacco Use   Smoking status: Every Day    Current packs/day: 1.00    Average packs/day: 1 pack/day for 54.2 years (54.2 ttl pk-yrs)    Types: Cigarettes    Start date: 61   Smokeless tobacco: Never   Tobacco comments:    Smoked 1 PPD- hfb RN 08/27/23  Vaping Use   Vaping status: Never Used  Substance and Sexual Activity   Alcohol use: Not Currently    Alcohol/week: 0.0 standard drinks of alcohol   Drug use: No   Sexual activity: Yes    Birth control/protection: Surgical  Other Topics Concern   Not on file  Social History Narrative   Not on file   Social Drivers of Health   Financial Resource Strain: Patient Declined (08/18/2023)   Overall Financial Resource Strain (CARDIA)    Difficulty of Paying Living Expenses: Patient declined  Food Insecurity: Patient Declined (08/18/2023)   Hunger Vital Sign    Worried About Running Out of Food in the Last Year: Patient declined    Ran Out of Food in the Last Year: Patient declined  Transportation Needs: Patient Declined (08/18/2023)   PRAPARE - Administrator, Civil Service (Medical): Patient declined    Lack of Transportation (Non-Medical): Patient declined  Physical Activity: Inactive (08/18/2023)   Exercise Vital Sign    Days of Exercise per Week: 0 days    Minutes of Exercise per Session: 0 min  Stress: No Stress Concern Present (08/18/2023)   [employer] of Occupational Health - Occupational Stress Questionnaire    Feeling of Stress : Not at all  Social Connections: Unknown (08/18/2023)   Social Connection and Isolation Panel [NHANES]    Frequency of Communication with Friends and Family: Patient declined    Frequency of Social Gatherings with Friends and Family: Patient  declined    Attends Religious Services: Patient declined    Database administrator or Organizations: Patient declined    Attends Banker Meetings: Never    Marital Status: Married  Catering manager Violence: Patient Declined (02/24/2022)   Humiliation, Afraid, Rape, and Kick questionnaire    Fear of Current or Ex-Partner: Patient declined    Emotionally Abused: Patient declined    Physically Abused: Patient declined    Sexually Abused: Patient declined     Allergies  Allergen Reactions   Enbrel [Etanercept] Hives    Other reaction(s): Muscle Cramps   Modafinil Rash   Augmentin  [Amoxicillin-Pot Clavulanate] Nausea And Vomiting    as stated (XR).   Cefdinir Diarrhea and Other (See Comments)    Other reaction(s): GI intolerance, Other (see comments) Gives her Cdiff Gives her Cdiff    Lisinopril Cough     CBC    Component Value Date/Time   WBC 13.7 (H) 07/30/2023 1029   WBC 11.0 (H) 07/31/2022 1112   RBC 4.73 07/30/2023 1029   HGB 15.2 (H) 07/30/2023 1029   HGB 13.9 04/23/2021 0917   HCT 47.4 (H) 07/30/2023 1029   HCT 42.3 04/23/2021 0917   PLT 354 07/30/2023 1029   PLT 599 (H) 04/23/2021 0917   MCV 100.2 (H) 07/30/2023 1029   MCV 96 04/23/2021 0917   MCV 98 10/18/2013 1529   MCH 32.1 07/30/2023 1029   MCHC 32.1 07/30/2023 1029   RDW 13.1 07/30/2023 1029   RDW 11.3 (L) 04/23/2021 0917   RDW 12.3 10/18/2013 1529   LYMPHSABS 2.2 07/30/2023 1029   LYMPHSABS 3.2 (  H) 04/23/2021 0917   LYMPHSABS 2.8 10/18/2013 1529   MONOABS 0.7 07/30/2023 1029   MONOABS 0.7 10/18/2013 1529   EOSABS 0.0 07/30/2023 1029   EOSABS 0.7 (H) 04/23/2021 0917   EOSABS 0.2 10/18/2013 1529   BASOSABS 0.1 07/30/2023 1029   BASOSABS 0.0 04/23/2021 0917   BASOSABS 0.2 (H) 10/18/2013 1529    Pulmonary Functions Testing Results:    Latest Ref Rng & Units 08/26/2023    4:16 PM  PFT Results  FVC-Predicted Pre % 67  P  FVC-Post L 1.76  P  FVC-Predicted Post % 74  P  Pre FEV1/FVC %  % 76  P  Post FEV1/FCV % % 78  P  FEV1-Pre L 1.22  P  FEV1-Predicted Pre % 68  P  FEV1-Post L 1.37  P  DLCO uncorrected ml/min/mmHg 13.68  P  DLCO UNC% % 84  P  DLVA Predicted % 91  P  TLC L 4.40  P  TLC % Predicted % 102  P  RV % Predicted % 131  P    P Preliminary result    Outpatient Medications Prior to Visit  Medication Sig Dispense Refill   amLODipine (NORVASC) 5 MG tablet TAKE 1 TABLET BY MOUTH DAILY 90 tablet 1   azelastine (ASTELIN) 0.1 % nasal spray Place 1 spray into both nostrils daily as needed.     Bempedoic Acid-Ezetimibe (NEXLIZET) 180-10 MG TABS Take 1 tablet by mouth daily. 30 tablet 3   clobetasol ointment (TEMOVATE) 0.05 % APP TOPICALLY TO GLUTEAL AREA ONCE D PRF DERMATITIS 30 g 1   diclofenac Sodium (VOLTAREN) 1 % GEL Apply 2 g topically 4 (four) times daily.     DULoxetine (CYMBALTA) 20 MG capsule Cymbalta 20 mg capsule,delayed release  Take 1 capsule twice a day by oral route.     escitalopram (LEXAPRO) 20 MG tablet TAKE ONE TABLET BY MOUTH EVERY DAY 90 tablet 1   fluticasone (FLONASE) 50 MCG/ACT nasal spray Place 2 sprays into both nostrils as needed for allergies or rhinitis.     gabapentin (NEURONTIN) 100 MG capsule Take 100 mg by mouth 2 (two) times daily as needed.     gabapentin (NEURONTIN) 300 MG capsule Take 300 mg by mouth as needed (qhs). PRN     gentamicin ointment (GARAMYCIN) 0.1 % Apply 1 application  topically 3 (three) times daily.     Golimumab (SIMPONI ARIA IV) Inject into the vein every 8 (eight) weeks. Infusion for RA.     Golimumab (SIMPONI ARIA IV) Inject into the vein every 8 (eight) weeks.     loratadine (CLARITIN) 10 MG tablet Take 10 mg by mouth daily.     losartan (COZAAR) 25 MG tablet TAKE 1 TABLET BY MOUTH DAILY 90 tablet 1   methocarbamol (ROBAXIN) 750 MG tablet Take 750 mg by mouth every 8 (eight) hours as needed for muscle spasms.     nitroGLYCERIN (NITROSTAT) 0.4 MG SL tablet Place 1 tablet (0.4 mg total) under the tongue every  5 (five) minutes as needed for chest pain. 30 tablet 12   ondansetron (ZOFRAN-ODT) 4 MG disintegrating tablet Take 1 tablet (4 mg total) by mouth every 8 (eight) hours as needed for nausea or vomiting. 20 tablet 0   oxyCODONE-acetaminophen (PERCOCET) 10-325 MG tablet Take 1 tablet by mouth every 4 (four) hours as needed for pain.     potassium chloride (KLOR-CON M) 10 MEQ tablet TAKE TWO TABLETS BY MOUTH EVERY DAY 60 tablet 1  predniSONE (DELTASONE) 10 MG tablet Take 1 tablet by mouth daily.     traZODone (DESYREL) 100 MG tablet TAKE 0.5-1 TABLET BY MOUTH AT BEDTIME. (Patient taking differently: Take 100 mg by mouth at bedtime as needed for sleep.) 30 tablet 5   triamcinolone cream (KENALOG) 0.1 % Apply topically 2 (two) times daily as needed.     Budeson-Glycopyrrol-Formoterol (BREZTRI AEROSPHERE) 160-9-4.8 MCG/ACT AERO Inhale 2 puffs into the lungs 2 (two) times daily. (Patient not taking: Reported on 08/27/2023) 10.7 g 11   Facility-Administered Medications Prior to Visit  Medication Dose Route Frequency Provider Last Rate Last Admin   albuterol (PROVENTIL) (2.5 MG/3ML) 0.083% nebulizer solution 2.5 mg  2.5 mg Nebulization Once [name], MD

## 2023-09-04 LAB — CYCLIC CITRUL PEPTIDE ANTIBODY, IGG/IGA: CCP Antibodies IgG/IgA: 21 U — ABNORMAL HIGH (ref 0–19)

## 2023-09-05 ENCOUNTER — Ambulatory Visit: Attending: Cardiology | Admitting: Cardiology

## 2023-09-05 ENCOUNTER — Encounter: Payer: Self-pay | Admitting: Cardiology

## 2023-09-05 VITALS — BP 112/76 | HR 100 | Ht 59.0 in | Wt 116.2 lb

## 2023-09-05 DIAGNOSIS — I639 Cerebral infarction, unspecified: Secondary | ICD-10-CM

## 2023-09-05 DIAGNOSIS — I2510 Atherosclerotic heart disease of native coronary artery without angina pectoris: Secondary | ICD-10-CM | POA: Diagnosis not present

## 2023-09-05 DIAGNOSIS — I10 Essential (primary) hypertension: Secondary | ICD-10-CM | POA: Diagnosis not present

## 2023-09-05 DIAGNOSIS — F17200 Nicotine dependence, unspecified, uncomplicated: Secondary | ICD-10-CM | POA: Diagnosis not present

## 2023-09-05 DIAGNOSIS — E7800 Pure hypercholesterolemia, unspecified: Secondary | ICD-10-CM | POA: Diagnosis not present

## 2023-09-05 DIAGNOSIS — IMO0001 Reserved for inherently not codable concepts without codable children: Secondary | ICD-10-CM

## 2023-09-05 NOTE — Patient Instructions (Signed)
 Medication Instructions:  No changes *If you need a refill on your cardiac medications before your next appointment, please call your pharmacy*  Lab Work: No labs  Testing/Procedures: No testing  Follow-Up: At [facility], you and your health needs are our priority.  As part of our continuing mission to provide you with exceptional heart care, we have created designated Provider Care Teams.  These Care Teams include your primary Cardiologist (physician) and Advanced Practice Providers (APPs -  Physician Assistants and Nurse Practitioners) who all work together to provide you with the care you need, when you need it.  We recommend signing up for the patient portal called "MyChart".  Sign up information is provided on this After Visit Summary.  MyChart is used to connect with patients for Virtual Visits (Telemedicine).  Patients are able to view lab/test results, encounter notes, upcoming appointments, etc.  Non-urgent messages can be sent to your provider as well.   To learn more about what you can do with MyChart, go to [email or web address].    Your next appointment:   1 year(s)  Provider:   Debbe Odea, MD

## 2023-09-05 NOTE — Progress Notes (Signed)
 Cardiology Office Note:    Date:  09/05/2023   ID:  April Best, DOB 06/07/1951, MRN [id number]  PCP:  Erasmo Downer, MD   [facility] Providers Cardiologist:  Debbe Odea, MD     Referring MD: Erasmo Downer, MD   No chief complaint on file.   History of Present Illness:    April Best is a 73 y.o. female with a hx of hypertension, hyperlipidemia, nonobstructive CAD (30% pLAD, 10% RCA on [facility] 9/23), current smoker x40+ years presenting for follow-up.  Previously seen due to MRI showing lacunar infarcts.  Cardiac monitor was ordered to evaluate any A-fib or flutter contributing to old infarct.  She has not worn a monitor yet, due to 1 reason or another.  Otherwise doing okay, denies chest pain.  Has no concerns at this time.   Prior notes Echo 02/24/2022 showed normal EF 60 to 65% Left heart cath 02/2022 30% pLAD, 10% RCA Hx of chest pain deemed noncardiac.  Likely GERD versus esophageal spasm.  Norvasc 5 mg daily was started.  Protonix also ordered to regimen.  She has generalized joint pain, arthritis, this has been going on for years before starting Crestor use.  Also has degenerative joint disease, has some chest tenderness with palpation.  Past Medical History:  Diagnosis Date   Allergic rhinitis    Allergy    Annular oral lichen planus    Anxiety    Arthritis    Depression    Deviated nasal septum    nasal obstruction   Guaiac positive stools    history   H. 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   ABDOMINOPLASTY      APPENDECTOMY     BREAST BIOPSY Right    neg- core   BRONCHIAL BRUSHINGS  08/21/2023   Procedure: BRONCHOSCOPY, WITH BRUSH BIOPSY;  Surgeon: Janann Colonel, MD;  Location: [facility] ENDOSCOPY;  Service: Pulmonary;;   BRONCHIAL NEEDLE ASPIRATION BIOPSY  08/21/2023   Procedure: BRONCHOSCOPY, WITH NEEDLE ASPIRATION BIOPSY;  Surgeon: Janann Colonel, MD;  Location: [facility] ENDOSCOPY;  Service: Pulmonary;;   BRONCHIAL WASHINGS  08/21/2023   Procedure: IRRIGATION, BRONCHUS;  Surgeon: Janann Colonel, MD;  Location: [facility] ENDOSCOPY;  Service: Pulmonary;;   COLONOSCOPY  03/2006   COLONOSCOPY WITH PROPOFOL N/A 11/19/2017   Procedure: COLONOSCOPY WITH PROPOFOL;  Surgeon: Toledo, Boykin Nearing, MD;  Location: [facility] ENDOSCOPY;  Service: Gastroenterology;  Laterality: N/A;   CORONARY/GRAFT ACUTE MI REVASCULARIZATION N/A 02/24/2022   Procedure: Coronary/Graft Acute MI Revascularization;  Surgeon: Iran Ouch, MD;  Location: [facility] INVASIVE CV LAB;  Service: Cardiovascular;  Laterality: N/A;   ENDOBRONCHIAL ULTRASOUND Bilateral 08/21/2023   Procedure: ENDOBRONCHIAL ULTRASOUND (EBUS);  Surgeon: Janann Colonel, MD;  Location: [facility] ENDOSCOPY;  Service: Pulmonary;  Laterality: Bilateral;   ENDOSCOPIC CONCHA BULLOSA RESECTION Right 01/30/2021   Procedure: ENDOSCOPIC CONCHA BULLOSA RESECTION;  Surgeon: Geanie Logan, MD;  Location: [facility]ERY [facility];  Service: ENT;  Laterality: Right;   ESOPHAGOGASTRODUODENOSCOPY N/A 03/19/2022  Procedure: ESOPHAGOGASTRODUODENOSCOPY (EGD);  Surgeon: Regis Bill, MD;  Location: [facility] ENDOSCOPY;  Service: Gastroenterology;  Laterality: N/A;   LEFT HEART CATH AND CORONARY ANGIOGRAPHY N/A 02/24/2022   Procedure: LEFT HEART CATH AND CORONARY ANGIOGRAPHY;  Surgeon: Iran Ouch, MD;  Location: [facility] INVASIVE CV LAB;  Service: Cardiovascular;  Laterality: N/A;   RHINOPLASTY  2002   SEPTOPLASTY N/A 01/30/2021   Procedure: SEPTOPLASTY;  Surgeon: Geanie Logan, MD;  Location: [facility]  SURGERY CNTR;  Service: ENT;  Laterality: N/A;   UPPER GI ENDOSCOPY  2008    Current Medications: Current Meds  Medication Sig   amLODipine (NORVASC) 5 MG tablet TAKE 1 TABLET BY MOUTH DAILY   azelastine (ASTELIN) 0.1 % nasal spray Place 1 spray into both nostrils daily as needed.   Bempedoic Acid-Ezetimibe (NEXLIZET) 180-10 MG TABS Take 1 tablet by mouth daily.   clobetasol ointment (TEMOVATE) 0.05 % APP TOPICALLY TO GLUTEAL AREA ONCE D PRF DERMATITIS   diclofenac Sodium (VOLTAREN) 1 % GEL Apply 2 g topically 4 (four) times daily.   DULoxetine (CYMBALTA) 20 MG capsule Cymbalta 20 mg capsule,delayed release  Take 1 capsule twice a day by oral route.   escitalopram (LEXAPRO) 20 MG tablet TAKE ONE TABLET BY MOUTH EVERY DAY   fluticasone (FLONASE) 50 MCG/ACT nasal spray Place 2 sprays into both nostrils as needed for allergies or rhinitis.   gabapentin (NEURONTIN) 100 MG capsule Take 100 mg by mouth 2 (two) times daily as needed.   gabapentin (NEURONTIN) 300 MG capsule Take 300 mg by mouth as needed (qhs). PRN   gentamicin ointment (GARAMYCIN) 0.1 % Apply 1 application  topically 3 (three) times daily.   Golimumab (SIMPONI ARIA IV) Inject into the vein every 8 (eight) weeks. Infusion for RA.   Golimumab (SIMPONI ARIA IV) Inject into the vein every 8 (eight) weeks.   loratadine (CLARITIN) 10 MG tablet Take 10 mg by mouth daily.   losartan (COZAAR) 25 MG tablet TAKE 1 TABLET BY MOUTH DAILY   methocarbamol (ROBAXIN) 750 MG tablet Take 750 mg by mouth every 8 (eight) hours as needed for muscle spasms.   nitroGLYCERIN (NITROSTAT) 0.4 MG SL tablet Place 1 tablet (0.4 mg total) under the tongue every 5 (five) minutes as needed for chest pain.   ondansetron (ZOFRAN-ODT) 4 MG disintegrating tablet Take 1 tablet (4 mg total) by mouth every 8 (eight) hours as needed for nausea or vomiting.   oxyCODONE-acetaminophen (PERCOCET) 10-325 MG tablet Take 1 tablet by mouth every 4 (four) hours as needed for  pain.   potassium chloride (KLOR-CON M) 10 MEQ tablet TAKE TWO TABLETS BY MOUTH EVERY DAY   predniSONE (DELTASONE) 10 MG tablet Take 1 tablet by mouth daily.   Tiotropium Bromide-Olodaterol (STIOLTO RESPIMAT) 2.5-2.5 MCG/ACT AERS Inhale 2 puffs into the lungs daily.   traZODone (DESYREL) 100 MG tablet TAKE 0.5-1 TABLET BY MOUTH AT BEDTIME. (Patient taking differently: Take 100 mg by mouth at bedtime as needed for sleep.)   triamcinolone cream (KENALOG) 0.1 % Apply topically 2 (two) times daily as needed.     Allergies:   Enbrel [etanercept], Modafinil, Augmentin  [amoxicillin-pot clavulanate], Cefdinir, and Lisinopril   Social History   Socioeconomic History   Marital status: Married    Spouse name: Not on file   Number of children: 1   Years of education: Not on file   Highest education level: High school graduate  Occupational History   Occupation: retired  Tobacco Use   Smoking status: Every  Day    Current packs/day: 1.00    Average packs/day: 1 pack/day for 54.2 years (54.2 ttl pk-yrs)    Types: Cigarettes    Start date: 49   Smokeless tobacco: Never   Tobacco comments:    Smoked 1 PPD- [facility] RN 08/27/23  Vaping Use   Vaping status: Never Used  Substance and Sexual Activity   Alcohol use: Not Currently    Alcohol/week: 0.0 standard drinks of alcohol   Drug use: No   Sexual activity: Yes    Birth control/protection: Surgical  Other Topics Concern   Not on file  Social History Narrative   Not on file   Social Drivers of Health   Financial Resource Strain: Patient Declined (08/18/2023)   Overall Financial Resource Strain (CARDIA)    Difficulty of Paying Living Expenses: Patient declined  Food Insecurity: Patient Declined (08/18/2023)   Hunger Vital Sign    Worried About Running Out of Food in the Last Year: Patient declined    Ran Out of Food in the Last Year: Patient declined  Transportation Needs: Patient Declined (08/18/2023)   PRAPARE - Scientist, research (physical sciences) (Medical): Patient declined    Lack of Transportation (Non-Medical): Patient declined  Physical Activity: Inactive (08/18/2023)   Exercise Vital Sign    Days of Exercise per Week: 0 days    Minutes of Exercise per Session: 0 min  Stress: No Stress Concern Present (08/18/2023)   [employer] of Occupational Health - Occupational Stress Questionnaire    Feeling of Stress : Not at all  Social Connections: Unknown (08/18/2023)   Social Connection and Isolation Panel [NHANES]    Frequency of Communication with Friends and Family: Patient declined    Frequency of Social Gatherings with Friends and Family: Patient declined    Attends Religious Services: Patient declined    Database administrator or Organizations: Patient declined    Attends Banker Meetings: Never    Marital Status: Married     Family History: The patient's family history includes ADD / ADHD in her brother; Coronary artery disease in her father; Fibromyalgia in her mother and sister; Hearing loss in her mother and sister; Heart attack in her father; Hemochromatosis in her sister; Hyperlipidemia in her father, mother, and sister; Hypertension in her father, mother, and sister; Hypothyroidism in her mother; Lung cancer in her father. There is no history of Breast cancer.  ROS:   Please see the history of present illness.     All other systems reviewed and are negative.  EKGs/Labs/Other Studies Reviewed:    The following studies were reviewed today:        Recent Labs: 07/30/2023: Hemoglobin 15.2; Platelet Count 354 08/21/2023: BUN 38; Creatinine, Ser 0.96; Potassium 4.9; Sodium 140  Recent Lipid Panel    Component Value Date/Time   CHOL 196 10/23/2022 0813   CHOL 230 (H) 08/09/2021 1402   TRIG 91 10/23/2022 0813   HDL 62 10/23/2022 0813   HDL 75 08/09/2021 1402   CHOLHDL 3.2 10/23/2022 0813   VLDL 18 10/23/2022 0813   LDLCALC 116 (H) 10/23/2022 0813   LDLCALC 134 (H) 08/09/2021 1402      Risk Assessment/Calculations:               Physical Exam:    VS:  BP 112/76   Pulse 100   Ht 4\' 11"  (1.499 m)   Wt 116 lb 3.2 oz (52.7 kg)   SpO2 96%  BMI 23.47 kg/m     Wt Readings from Last 3 Encounters:  09/05/23 116 lb 3.2 oz (52.7 kg)  08/27/23 116 lb 12.8 oz (53 kg)  08/25/23 113 lb 9.6 oz (51.5 kg)     GEN:  Well nourished, well developed in no acute distress HEENT: Normal NECK: No JVD; No carotid bruits CARDIAC: RRR, no murmurs, rubs, gallops RESPIRATORY:  Clear to auscultation without rales, wheezing or rhonchi  ABDOMEN: Soft, non-tender, non-distended MUSCULOSKELETAL:  No edema; tenderness with chest palpation SKIN: Warm and dry NEUROLOGIC:  Alert and oriented x 3 PSYCHIATRIC:  Normal affect   ASSESSMENT:    1. Coronary artery disease involving native coronary artery of native heart, unspecified whether angina present   2. Primary hypertension   3. Pure hypercholesterolemia   4. Smoking   5. Cerebrovascular accident (CVA), unspecified mechanism (HCC)    PLAN:    In order of problems listed above:  Nonobstructive CAD, mild LAD, minimal RCA disease.  Denies chest pain.  Continue aspirin, nexlizet.  EF 60 to 65%.  Hypertension, BP controlled.  Continue losartan 25 mg daily, Norvasc 5 mg daily. Hyperlipidemia, not tolerant to statins.  Continue Nexlizet.  Current smoker, smoking cessation advised History of old cerebral infarcts, aspirin 81 mg daily, place cardiac monitor to evaluate A-fib.  Will make early appointment if A-fib or flutter is noted on monitor.  Otherwise follow-up annually.  Follow-up in 12 months     Medication Adjustments/Labs and Tests Ordered: Current medicines are reviewed at length with the patient today.  Concerns regarding medicines are outlined above.  No orders of the defined types were placed in this encounter.  No orders of the defined types were placed in this encounter.   Patient Instructions  Medication  Instructions:  No changes *If you need a refill on your cardiac medications before your next appointment, please call your pharmacy*  Lab Work: No labs  Testing/Procedures: No testing  Follow-Up: At [facility], you and your health needs are our priority.  As part of our continuing mission to provide you with exceptional heart care, we have created designated Provider Care Teams.  These Care Teams include your primary Cardiologist (physician) and Advanced Practice Providers (APPs -  Physician Assistants and Nurse Practitioners) who all work together to provide you with the care you need, when you need it.  We recommend signing up for the patient portal called "MyChart".  Sign up information is provided on this After Visit Summary.  MyChart is used to connect with patients for Virtual Visits (Telemedicine).  Patients are able to view lab/test results, encounter notes, upcoming appointments, etc.  Non-urgent messages can be sent to your provider as well.   To learn more about what you can do with MyChart, go to [email or web address].    Your next appointment:   1 year(s)  Provider:   Debbe Odea, MD       Signed, Debbe Odea, MD  09/05/2023 4:10 PM    [facility]

## 2023-09-09 ENCOUNTER — Ambulatory Visit
Admission: RE | Admit: 2023-09-09 | Discharge: 2023-09-09 | Disposition: A | Discharge status: Home / Self Care | Payer: Self-pay | Source: Ambulatory Visit | Attending: Emergency Medicine | Admitting: Emergency Medicine

## 2023-09-09 VITALS — BP 126/79 | HR 106 | Temp 98.8°F | Resp 20

## 2023-09-09 DIAGNOSIS — J441 Chronic obstructive pulmonary disease with (acute) exacerbation: Secondary | ICD-10-CM | POA: Diagnosis not present

## 2023-09-09 MED ORDER — IPRATROPIUM-ALBUTEROL 0.5-2.5 (3) MG/3ML IN SOLN
3.0000 mL | Freq: Once | RESPIRATORY_TRACT | Status: AC
  Administered 2023-09-09: 3 mL via RESPIRATORY_TRACT

## 2023-09-09 MED ORDER — METHYLPREDNISOLONE ACETATE 80 MG/ML IJ SUSP
60.0000 mg | Freq: Once | INTRAMUSCULAR | Status: AC
  Administered 2023-09-09: 60 mg via INTRAMUSCULAR

## 2023-09-09 MED ORDER — AZITHROMYCIN 250 MG PO TABS: 250.0000 mg | ORAL_TABLET | Freq: Every day | ORAL | 0 refills | Status: DC

## 2023-09-09 MED ORDER — PREDNISONE 20 MG PO TABS
40.0000 mg | ORAL_TABLET | Freq: Every day | ORAL | 0 refills | Status: DC
Start: 2023-09-09 — End: 2023-10-28

## 2023-09-09 NOTE — Discharge Instructions (Addendum)
 Begin azithromycin as directed.  Provide bacterial coverage  You have been given an injection of a steroid and given a nebulizer treatment to open and relax your airway as your oxygen level is on the lower side of normal and is typically higher as well as your heart rate is increased because you are working hard to breathe  Starting tomorrow take oral prednisone as directed to keep airway open and relax  Continue all inhalers as directed    You can take Tylenol and/or Ibuprofen as needed for fever reduction and pain relief.   For cough: honey 1/2 to 1 teaspoon (you can dilute the honey in water or another fluid).  You can also use guaifenesin and dextromethorphan for cough. You can use a humidifier for chest congestion and cough.  If you don't have a humidifier, you can sit in the bathroom with the hot shower running.      For sore throat: try warm salt water gargles, cepacol lozenges, throat spray, warm tea or water with lemon/honey, popsicles or ice, or OTC cold relief medicine for throat discomfort.   For congestion: take a daily anti-histamine like Zyrtec, Claritin, and a oral decongestant, such as pseudoephedrine.  You can also use Flonase 1-2 sprays in each nostril daily.   It is important to stay hydrated: drink plenty of fluids (water, gatorade/powerade/pedialyte, juices, or teas) to keep your throat moisturized and help further relieve irritation/discomfort.  At any point if your symptoms worsen please go to the nearest emergency department for immediate evaluation

## 2023-09-09 NOTE — ED Provider Notes (Signed)
 April Best    CSN: [id number] Arrival date & time: 09/09/23  1342      History   Chief Complaint Chief Complaint  Patient presents with   Nasal Congestion    Sinus infection. Bloody discharge when I blow my nose. - Entered by patient    HPI April Best is a 73 y.o. female.   Presents for evaluation of nasal congestion, rhinorrhea, intermittent headaches, productive cough and shortness of breath with exertion present for 7 days.  Associated wheezing.  Has been using Percocet for pain as well as inhalers.  History of interstitial lung disease and COPD.  No known sick contacts.  Poor appetite but able to tolerate some food and liquids.   Past Medical History:  Diagnosis Date   Allergic rhinitis    Allergy    Annular oral lichen planus    Anxiety    Arthritis    Depression    Deviated nasal septum    nasal obstruction   Guaiac positive stools    history   H. 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   History of palpitations    Hyperlipidemia    Hypertension    Migraine    Myocardial infarction [facility])    Osteoarthritis    Osteoporosis    Panic disorder    Psoriasis    Psoriatic arthritis (HCC)    Rheumatoid arteritis (HCC)    Tinnitus     Patient Active Problem List   Diagnosis Date Noted   Moderate smoker (20 or less per day) 08/26/2023   CVA (cerebral vascular accident) (HCC) 08/25/2023   AAA (abdominal aortic aneurysm) without rupture (HCC) 08/20/2023   Prediabetes 08/18/2023   Lung nodule seen on imaging study 07/30/2023   History of ST elevation myocardial infarction (STEMI) 03/06/2022   Gastroesophageal reflux disease    Chest pain of uncertain etiology 02/24/2022   Depression with anxiety 02/24/2022   RA (rheumatoid arthritis) (HCC) 02/24/2022   Upper abdominal pain 02/24/2022   Lung nodule 02/24/2022   Lichen planus of tongue 10/17/2021   Hyperthyroidism 10/17/2021   Wheezing 10/17/2021    Chronic obstructive pulmonary disease (HCC) 10/17/2021   Bilateral sacral insufficiency fracture 09/05/2021   Cervico-occipital neuralgia 09/05/2021   Hip pain 09/05/2021   Lumbar radiculopathy 09/05/2021   Nausea 09/05/2021   Osteoporosis 09/05/2021   Chronic pain syndrome 09/05/2021   Right facial numbness 08/09/2021   PAD (peripheral artery disease) (HCC) 08/09/2021   Recurrent major depressive disorder, in full remission (HCC) 08/09/2021   Senile purpura (HCC) 08/09/2021   Fibromyalgia 04/24/2021   Leukocytosis 04/23/2021   Hospital discharge follow-up 04/23/2021   Chronic fatigue 04/05/2021   Skin rash 04/05/2021   Hot flashes 04/05/2021   Pharyngoesophageal dysphagia 04/05/2021   Raised antibody titer 03/01/2020   Psoriatic arthritis (HCC) 01/25/2020   ANA positive 01/19/2020   Renal cyst 12/01/2019   Proteinuria 12/01/2019   Low back pain 07/12/2019   Pulmonary emphysema (HCC) 07/24/2018   Elevated glucose 02/13/2015   [name] (HCC) 12/14/2014   Family history of hemochromatosis 11/02/2014   Hypercholesteremia 11/02/2014   Essential hypertension 11/02/2014   Current tobacco use 11/02/2014   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   ABDOMINOPLASTY     APPENDECTOMY     BREAST BIOPSY Right    neg- core  BRONCHIAL BRUSHINGS  08/21/2023   Procedure: BRONCHOSCOPY, WITH BRUSH BIOPSY;  Surgeon: Janann Colonel, MD;  Location: [facility] ENDOSCOPY;  Service: Pulmonary;;   BRONCHIAL NEEDLE ASPIRATION BIOPSY  08/21/2023   Procedure: BRONCHOSCOPY, WITH NEEDLE ASPIRATION BIOPSY;  Surgeon: Janann Colonel, MD;  Location: [facility] ENDOS[facility];  Service: Pulmonary;;   BRONCHIAL WASHINGS  08/21/2023   Procedure: IRRIGATION, BRONCHUS;  Surgeon: Janann Colonel, MD;  Location: [facility] ENDOSCOPY;  Service: Pulmonary;;   COLONOSCOPY  03/2006    COLONOSCOPY WITH PROPOFOL N/A 11/19/2017   Procedure: COLONOSCOPY WITH PROPOFOL;  Surgeon: Toledo, Boykin Nearing, MD;  Location: [facility] ENDOSCOPY;  Service: Gastroenterology;  Laterality: N/A;   CORONARY/GRAFT ACUTE MI REVASCULARIZATION N/A 02/24/2022   Procedure: Coronary/Graft Acute MI Revascularization;  Surgeon: Iran Ouch, MD;  Location: [facility] INVASIVE CV LAB;  Service: Cardiovascular;  Laterality: N/A;   ENDOBRONCHIAL ULTRASOUND Bilateral 08/21/2023   Procedure: ENDOBRONCHIAL ULTRASOUND (EBUS);  Surgeon: Janann Colonel, MD;  Location[facility] ENDOSCOPY;  Service: Pulmonary;  Laterality: Bilateral;   ENDOSCOPIC CONCHA BULLOSA RESECTION Right 01/30/2021   Procedure: ENDOSCOPIC CONCHA BULLOSA RESECTION;  Surgeon: Geanie Logan, MD;  Location: [facility];  Service: ENT;  Laterality: Right;   ESOPHAGOGASTRODUODENOSCOPY N/A 03/19/2022   Procedure: ESOPHAGOGASTRODUODENOSCOPY (EGD);  Surgeon: Regis Bill, MD;  Location: [facility] ENDOSCOPY;  Service: Gastroenterology;  Laterality: N/A;   LEFT HEART CATH AND CORONARY ANGIOGRAPHY N/A 02/24/2022   Procedure: LEFT HEART CATH AND CORONARY ANGIOGRAPHY;  Surgeon: Iran Ouch, MD;  Location: [facility] INVASIVE CV LAB;  Service: Cardiovascular;  Laterality: N/A;   RHINOPLASTY  2002   SEPTOPLASTY N/A 01/30/2021   Procedure: SEPTOPLASTY;  Surgeon: Geanie Logan, MD;  Location: [facility];  Service: ENT;  Laterality: N/A;   UPPER GI ENDOSCOPY  2008    OB History     Gravida  1   Para  1   Term      Preterm      AB      Living         SAB      IAB      Ectopic      Multiple      Live Births               Home Medications    Prior to Admission medications   Medication Sig Start Date End Date Taking? Authorizing Provider  azithromycin (ZITHROMAX) 250 MG tablet Take 1 tablet (250 mg total) by mouth daily. Take first 2 tablets together, then 1 every day until finished. 09/09/23  Yes Pascale Maves R, NP  predniSONE  (DELTASONE) 20 MG tablet Take 2 tablets (40 mg total) by mouth daily. 09/09/23  Yes Jveon Pound R, NP  amLODipine (NORVASC) 5 MG tablet TAKE 1 TABLET BY MOUTH DAILY 01/01/23   Bacigalupo, Marzella Schlein, MD  azelastine (ASTELIN) 0.1 % nasal spray Place 1 spray into both nostrils daily as needed.    [provider]  Bempedoic Acid-Ezetimibe (NEXLIZET) 180-10 MG TABS Take 1 tablet by mouth daily. 04/28/23   Debbe Odea, MD  clobetasol ointment (TEMOVATE) 0.05 % APP TOPICALLY TO GLUTEAL AREA ONCE D PRF DERMATITIS 10/07/18   Trey Sailors, PA-C  diclofenac Sodium (VOLTAREN) 1 % GEL Apply 2 g topically 4 (four) times daily. 09/07/20   [provider]  DULoxetine (CYMBALTA) 20 MG capsule Cymbalta 20 mg capsule,delayed release  Take 1 capsule twice a day by oral route.    [provider]  escitalopram (LEXAPRO) 20 MG tablet TAKE ONE  TABLET BY MOUTH EVERY DAY 10/16/22   Erasmo Downer, MD  fluticasone [facility]) 50 MCG/ACT nasal spray Place 2 sprays into both nostrils as needed for allergies or rhinitis. 05/07/21   [provider]  gabapentin (NEURONTIN) 100 MG capsule Take 100 mg by mouth 2 (two) times daily as needed. 11/05/19   [provider]  gabapentin (NEURONTIN) 300 MG capsule Take 300 mg by mouth as needed (qhs). PRN    [provider]  gentamicin ointment (GARAMYCIN) 0.1 % Apply 1 application  topically 3 (three) times daily.    [provider]  Golimumab (SIMPONI ARIA IV) Inject into the vein every 8 (eight) weeks. Infusion for RA.    [provider]  Golimumab (SIMPONI ARIA IV) Inject into the vein every 8 (eight) weeks.    [provider]  loratadine (CLARITIN) 10 MG tablet Take 10 mg by mouth daily.    [provider]  losartan (COZAAR) 25 MG tablet TAKE 1 TABLET BY MOUTH DAILY 11/28/22   Jacky Kindle, FNP  methocarbamol (ROBAXIN) 750 MG tablet Take 750 mg by mouth every 8 (eight) hours as needed  for muscle spasms.    [provider]  nitroGLYCERIN (NITROSTAT) 0.4 MG SL tablet Place 1 tablet (0.4 mg total) under the tongue every 5 (five) minutes as needed for chest pain. 02/26/22   Enedina Finner, MD  ondansetron (ZOFRAN-ODT) 4 MG disintegrating tablet Take 1 tablet (4 mg total) by mouth every 8 (eight) hours as needed for nausea or vomiting. 07/24/22   Waldon Merl, PA-C  oxyCODONE-acetaminophen (PERCOCET) 10-325 MG tablet Take 1 tablet by mouth every 4 (four) hours as needed for pain.    [provider]  potassium chloride (KLOR-CON M) 10 MEQ tablet TAKE TWO TABLETS BY MOUTH EVERY DAY 06/24/23   Erasmo Downer, MD  Tiotropium Bromide-Olodaterol (STIOLTO RESPIMAT) 2.5-2.5 MCG/ACT AERS Inhale 2 puffs into the lungs daily. 08/27/23   [name], MD  traZODone (DESYREL) 100 MG tablet TAKE 0.5-1 TABLET BY MOUTH AT BEDTIME. Patient taking differently: Take 100 mg by mouth at bedtime as needed for sleep. 10/21/22   Simmons-Robinson, Makiera, MD  triamcinolone cream (KENALOG) 0.1 % Apply topically 2 (two) times daily as needed. 04/02/21   [provider]    Family History Family History  Problem Relation Age of Onset   Hypertension Mother    Hyperlipidemia Mother    Hypothyroidism Mother    Fibromyalgia Mother    Hearing loss Mother    Lung cancer Father    Coronary artery disease Father    Hyperlipidemia Father    Hypertension Father    Heart attack Father    Hearing loss Sister    Hyperlipidemia Sister    Hypertension Sister    Fibromyalgia Sister    Hemochromatosis Sister    ADD / ADHD Brother    Breast cancer Neg Hx     Social History Social History   Tobacco Use   Smoking status: Every Day    Current packs/day: 1.00    Average packs/day: 1 pack/day for 54.2 years (54.2 ttl pk-yrs)    Types: Cigarettes    Start date: 1971   Smokeless tobacco: Never   Tobacco comments:    Smoked 1 PPD- hfb RN 08/27/23  Vaping Use   Vaping status: Never  Used  Substance Use Topics   Alcohol use: Not Currently    Alcohol/week: 0.0 standard drinks of alcohol   Drug use: No  Allergies   Enbrel [etanercept], Modafinil, Augmentin  [amoxicillin-pot clavulanate], Cefdinir, and Lisinopril   Review of Systems Review of Systems   Physical Exam Triage Vital Signs ED Triage Vitals  Encounter Vitals Group     BP 09/09/23 1404 126/79     Systolic BP Percentile --      Diastolic BP Percentile --      Pulse Rate 09/09/23 1404 (!) 107     Resp 09/09/23 1404 20     Temp 09/09/23 1404 98.8 F (37.1 C)     Temp Source 09/09/23 1404 Oral     SpO2 09/09/23 1404 91 %     Weight --      Height --      Head Circumference --      Peak Flow --      Pain Score 09/09/23 1407 8     Pain Loc --      Pain Education --      Exclude from Growth Chart --    No data found.  Updated Vital Signs BP 126/79 (BP Location: Left Arm)   Pulse (!) 106   Temp 98.8 F (37.1 C) (Oral)   Resp 20   SpO2 (S) 91% Comment: post neb tx  Visual Acuity Right Eye Distance:   Left Eye Distance:   Bilateral Distance:    Right Eye Near:   Left Eye Near:    Bilateral Near:     Physical Exam Constitutional:      Appearance: Normal appearance.  Eyes:     Extraocular Movements: Extraocular movements intact.  Cardiovascular:     Rate and Rhythm: Normal rate and regular rhythm.     Pulses: Normal pulses.     Heart sounds: Normal heart sounds.  Pulmonary:     Breath sounds: Normal breath sounds.     Comments: Breathing appears labored Neurological:     Mental Status: She is alert and oriented to person, place, and time. Mental status is at baseline.      UC Treatments / Results  Labs (all labs ordered are listed, but only abnormal results are displayed) Labs Reviewed - No data to display  EKG   Radiology No results found.  Procedures Procedures (including critical care time)  Medications Ordered in UC Medications  methylPREDNISolone  acetate (DEPO-MEDROL) injection 60 mg (60 mg Intramuscular Given 09/09/23 1421)  ipratropium-albuterol (DUONEB) 0.5-2.5 (3) MG/3ML nebulizer solution 3 mL (3 mLs Nebulization Given 09/09/23 1421)    Initial Impression / Assessment and Plan / UC Course  I have reviewed the triage vital signs and the nursing notes.  Pertinent labs & imaging results that were available during my care of the patient were reviewed by me and considered in my medical decision making (see chart for details).  COPD exacerbation  Vital signs are stable, O2 saturation fluctuating between 89 to 91% on room air, breathing appears labored but patient does not appear toxic, is are clear to auscultation, stable for outpatient management, DuoNeb and methylprednisolone IM given, prescribed azithromycin, prednisone for home use, declined prescription for cough medicine, encourage use of inhalers as directed and recommended follow-up if symptoms continue to persist, for any worsening symptoms instructed to go to the nearest emergency department, verbalized understanding Final Clinical Impressions(s) / UC Diagnoses   Final diagnoses:  COPD exacerbation [facility])     Discharge Instructions      Begin azithromycin as directed.  Provide bacterial coverage  You have been given an injection of a steroid and  given a nebulizer treatment to open and relax your airway as your oxygen level is on the lower side of normal and is typically higher as well as your heart rate is increased because you are working hard to breathe  Starting tomorrow take oral prednisone as directed to keep airway open and relax  Continue all inhalers as directed    You can take Tylenol and/or Ibuprofen as needed for fever reduction and pain relief.   For cough: honey 1/2 to 1 teaspoon (you can dilute the honey in water or another fluid).  You can also use guaifenesin and dextromethorphan for cough. You can use a humidifier for chest congestion and cough.  If you  don't have a humidifier, you can sit in the bathroom with the hot shower running.      For sore throat: try warm salt water gargles, cepacol lozenges, throat spray, warm tea or water with lemon/honey, popsicles or ice, or OTC cold relief medicine for throat discomfort.   For congestion: take a daily anti-histamine like Zyrtec, Claritin, and a oral decongestant, such as pseudoephedrine.  You can also use Flonase 1-2 sprays in each nostril daily.   It is important to stay hydrated: drink plenty of fluids (water, gatorade/powerade/pedialyte, juices, or teas) to keep your throat moisturized and help further relieve irritation/discomfort.  At any point if your symptoms worsen please go to the nearest emergency department for immediate evaluation    ED Prescriptions     Medication Sig Dispense Auth. Provider   azithromycin (ZITHROMAX) 250 MG tablet Take 1 tablet (250 mg total) by mouth daily. Take first 2 tablets together, then 1 every day until finished. 6 tablet Laine Giovanetti R, NP   predniSONE (DELTASONE) 20 MG tablet Take 2 tablets (40 mg total) by mouth daily. 10 tablet Valinda Hoar, NP      PDMP not reviewed this encounter.   Valinda Hoar, NP 09/09/23 [phone]

## 2023-09-09 NOTE — ED Triage Notes (Signed)
 Patient presents to UC for HA, cough, SOB x 1 week. Nasal congestion since Thursday. Blew her nose and had blood in her mucous. Increased SOB with activity. Treating with percocet for pain.

## 2023-09-10 ENCOUNTER — Ambulatory Visit: Payer: PPO | Admitting: Cardiology

## 2023-09-25 LAB — MISC LABCORP TEST (SEND OUT): Labcorp test code: 520085

## 2023-10-02 DIAGNOSIS — I639 Cerebral infarction, unspecified: Secondary | ICD-10-CM | POA: Diagnosis not present

## 2023-10-02 DIAGNOSIS — I2510 Atherosclerotic heart disease of native coronary artery without angina pectoris: Secondary | ICD-10-CM | POA: Diagnosis not present

## 2023-10-03 ENCOUNTER — Telehealth: Payer: Self-pay | Admitting: Cardiology

## 2023-10-03 DIAGNOSIS — Z0189 Encounter for other specified special examinations: Secondary | ICD-10-CM | POA: Diagnosis not present

## 2023-10-03 DIAGNOSIS — M19011 Primary osteoarthritis, right shoulder: Secondary | ICD-10-CM | POA: Diagnosis not present

## 2023-10-03 DIAGNOSIS — M25511 Pain in right shoulder: Secondary | ICD-10-CM | POA: Diagnosis not present

## 2023-10-03 NOTE — Telephone Encounter (Signed)
 Dr. Junnie Olives  We have received a surgical clearance request for Ms. April Best for reverse total shoulder arthroplasty. They were seen recently in clinic on 09/05/2023. Can you please comment on surgical clearance for her upcoming shoulder procedure. Please forward you guidance and recommendations to P CV DIV PREOP   Thank you,  Charles Connor, NP

## 2023-10-03 NOTE — Telephone Encounter (Signed)
   Pre-operative Risk Assessment    Patient Name: April Best  DOB: Jun 02, 1951 MRN: [id number]   Date of last office visit: 09/05/2023 Date of next office visit: n/a   Request for Surgical Clearance    Procedure:   Rt TSA Reverse  Date of Surgery:  Clearance 12/25/23                                Surgeon:  Dr. Mathews Solomons Surgeon's Group or Practice Name:  [facility] Phone number:  [phone] Fax number:  [phone]   Type of Clearance Requested:   - Medical    Type of Anesthesia:  Local /spinal block   Additional requests/questions:    SignedGenny Kid Schools   10/03/2023, 3:37 PM

## 2023-10-06 DIAGNOSIS — I2510 Atherosclerotic heart disease of native coronary artery without angina pectoris: Secondary | ICD-10-CM | POA: Diagnosis not present

## 2023-10-06 DIAGNOSIS — I639 Cerebral infarction, unspecified: Secondary | ICD-10-CM | POA: Diagnosis not present

## 2023-10-06 NOTE — Telephone Encounter (Signed)
   Patient Name: April Best  DOB: July 15, 1950 MRN: [id number]  Primary Cardiologist: Constancia Delton, MD  Chart reviewed as part of pre-operative protocol coverage. Given past medical history and time since last visit, based on ACC/AHA guidelines, April Best is at acceptable risk for the planned procedure without further cardiovascular testing.   Per Dr. Junnie Olives, who last saw the pt on 09/05/2023, "Okay for surgical procedure from a cardiac perspective."   I will route this recommendation to the requesting party via Epic fax function and remove from pre-op pool.  Please call with questions.  Jude Norton, NP 10/06/2023, 8:28 AM

## 2023-10-08 ENCOUNTER — Ambulatory Visit: Payer: Self-pay

## 2023-10-08 DIAGNOSIS — Z0000 Encounter for general adult medical examination without abnormal findings: Secondary | ICD-10-CM

## 2023-10-08 DIAGNOSIS — Z1211 Encounter for screening for malignant neoplasm of colon: Secondary | ICD-10-CM

## 2023-10-08 DIAGNOSIS — Z780 Asymptomatic menopausal state: Secondary | ICD-10-CM

## 2023-10-08 DIAGNOSIS — Z1231 Encounter for screening mammogram for malignant neoplasm of breast: Secondary | ICD-10-CM

## 2023-10-08 NOTE — Progress Notes (Signed)
 Subjective:   April Best is a 73 y.o. who presents for a Medicare Wellness preventive visit.  Visit Complete: Virtual I connected with  CALEDONIA ZOU on 10/08/23 by a audio enabled telemedicine application and verified that I am speaking with the correct person using two identifiers.  Patient Location: Home  Provider Location: Home Office  I discussed the limitations of evaluation and management by telemedicine. The patient expressed understanding and agreed to proceed.  Vital Signs: Because this visit was a virtual/telehealth visit, some criteria may be missing or patient reported. Any vitals not documented were not able to be obtained and vitals that have been documented are patient reported.  VideoDeclined- This patient declined Librarian, academic. Therefore the visit was completed with audio only.  Persons Participating in Visit: Patient.  AWV Questionnaire: No: Patient Medicare AWV questionnaire was not completed prior to this visit.  Cardiac Risk Factors include: advanced age (>32men, >60 women);dyslipidemia;hypertension;sedentary lifestyle;smoking/ tobacco exposure     Objective:    Today's Vitals   10/08/23 1057  PainSc: 4    There is no height or weight on file to calculate BMI.     10/08/2023   11:03 AM 08/21/2023    6:27 AM 02/04/2023    1:29 PM 10/07/2022   11:06 AM 08/07/2022    1:41 PM 03/19/2022    8:52 AM 02/24/2022    6:25 AM  Advanced Directives  Does Patient Have a Medical Advance Directive? No No No Yes No Yes No  Would patient like information on creating a medical advance directive? No - Patient declined No - Patient declined No - Patient declined  No - Patient declined  No - Patient declined    Current Medications (verified) Outpatient Encounter Medications as of 10/08/2023  Medication Sig   amLODipine  (NORVASC ) 5 MG tablet TAKE 1 TABLET BY MOUTH DAILY   azelastine (ASTELIN) 0.1 % nasal spray Place 1 spray into  both nostrils daily as needed.   Bempedoic Acid-Ezetimibe  (NEXLIZET ) 180-10 MG TABS Take 1 tablet by mouth daily.   clobetasol  ointment (TEMOVATE ) 0.05 % APP TOPICALLY TO GLUTEAL AREA ONCE D PRF DERMATITIS   DULoxetine  (CYMBALTA ) 20 MG capsule Cymbalta  20 mg capsule,delayed release  Take 1 capsule twice a day by oral route.   escitalopram  (LEXAPRO ) 20 MG tablet TAKE ONE TABLET BY MOUTH EVERY DAY   fluticasone  (FLONASE ) 50 MCG/ACT nasal spray Place 2 sprays into both nostrils as needed for allergies or rhinitis.   gabapentin  (NEURONTIN ) 100 MG capsule Take 100 mg by mouth 2 (two) times daily as needed.   gabapentin  (NEURONTIN ) 300 MG capsule Take 300 mg by mouth as needed (qhs). PRN   loratadine (CLARITIN) 10 MG tablet Take 10 mg by mouth daily.   losartan  (COZAAR ) 25 MG tablet TAKE 1 TABLET BY MOUTH DAILY   methocarbamol (ROBAXIN) 750 MG tablet Take 750 mg by mouth every 8 (eight) hours as needed for muscle spasms.   nitroGLYCERIN  (NITROSTAT ) 0.4 MG SL tablet Place 1 tablet (0.4 mg total) under the tongue every 5 (five) minutes as needed for chest pain.   ondansetron  (ZOFRAN -ODT) 4 MG disintegrating tablet Take 1 tablet (4 mg total) by mouth every 8 (eight) hours as needed for nausea or vomiting.   oxyCODONE -acetaminophen  (PERCOCET) 10-325 MG tablet Take 1 tablet by mouth every 4 (four) hours as needed for pain.   potassium chloride  (KLOR-CON  M) 10 MEQ tablet TAKE TWO TABLETS BY MOUTH EVERY DAY   predniSONE  (DELTASONE ) 20  MG tablet Take 2 tablets (40 mg total) by mouth daily.   Tiotropium Bromide-Olodaterol (STIOLTO RESPIMAT ) 2.5-2.5 MCG/ACT AERS Inhale 2 puffs into the lungs daily.   traZODone  (DESYREL ) 100 MG tablet TAKE 0.5-1 TABLET BY MOUTH AT BEDTIME. (Patient taking differently: Take 100 mg by mouth at bedtime as needed for sleep.)   triamcinolone  cream (KENALOG ) 0.1 % Apply topically 2 (two) times daily as needed.   azithromycin  (ZITHROMAX ) 250 MG tablet Take 1 tablet (250 mg total) by  mouth daily. Take first 2 tablets together, then 1 every day until finished. (Patient not taking: Reported on 10/08/2023)   diclofenac Sodium (VOLTAREN) 1 % GEL Apply 2 g topically 4 (four) times daily. (Patient not taking: Reported on 10/08/2023)   gentamicin ointment (GARAMYCIN) 0.1 % Apply 1 application  topically 3 (three) times daily. (Patient not taking: Reported on 10/08/2023)   Golimumab  (SIMPONI  ARIA IV) Inject into the vein every 8 (eight) weeks. Infusion for RA. (Patient not taking: Reported on 10/08/2023)   [DISCONTINUED] Golimumab  (SIMPONI  ARIA IV) Inject into the vein every 8 (eight) weeks.   Facility-Administered Encounter Medications as of 10/08/2023  Medication   albuterol  (PROVENTIL ) (2.5 MG/3ML) 0.083% nebulizer solution 2.5 mg    Allergies (verified) Enbrel [etanercept], Modafinil, Augmentin  [amoxicillin-pot clavulanate], Cefdinir , Lisinopril, and Macrobid  [nitrofurantoin ]   History: Past Medical History:  Diagnosis Date   Allergic rhinitis    Allergy    Annular oral lichen planus    Anxiety    Arthritis    Depression    Deviated nasal septum    nasal obstruction   Guaiac positive stools    history   H. 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   ABDOMINOPLASTY     APPENDECTOMY     BREAST BIOPSY Right    neg- core   BRONCHIAL BRUSHINGS  08/21/2023   Procedure: BRONCHOSCOPY, WITH BRUSH BIOPSY;  Surgeon: Annitta Kindler, MD;  Location: [facility];  Service: Pulmonary;;   BRONCHIAL NEEDLE ASPIRATION BIOPSY  08/21/2023   Procedure: BRONCHOSCOPY, WITH NEEDLE  ASPIRATION BIOPSY;  Surgeon: Annitta Kindler, MD;  Location: [facility];  Service: Pulmonary;;   BRONCHIAL WASHINGS  08/21/2023   Procedure: IRRIGATION, BRONCHUS;  Surgeon: Annitta Kindler, MD;  Location: [facility];  Service: Pulmonary;;   COLONOSCOPY  03/2006   COLONOSCOPY WITH PROPOFOL  N/A 11/19/2017   Procedure: COLONOSCOPY WITH PROPOFOL ;  Surgeon: Toledo, Alphonsus Jeans, MD;  Location: [facility] ENDOSCOPY;  Service: Gastroenterology;  Laterality: N/A;   CORONARY/GRAFT ACUTE MI REVASCULARIZATION N/A 02/24/2022   Procedure: Coronary/Graft Acute MI Revascularization;  Surgeon: Wenona Hamilton, MD;  Location: [facility]IVE CV [facility];  Service: Cardiovascular;  Laterality: N/A;   ENDOBRONCHIAL ULTRASOUND Bilateral 08/21/2023   Procedure: ENDOBRONCHIAL ULTRASOUND (EBUS);  Surgeon: Annitta Kindler, MD;  Location: [facility] ENDOSCOPY;  Service: Pulmonary;  Laterality: Bilateral;   ENDOSCOPIC CONCHA BULLOSA RESECTION Right 01/30/2021   Procedure: ENDOSCOPIC CONCHA BULLOSA RESECTION;  Surgeon: Von Grumbling, MD;  Location: [facility];  Service: ENT;  Laterality: Right;   ESOPHAGOGASTRODUODENOSCOPY  N/A 03/19/2022   Procedure: ESOPHAGOGASTRODUODENOSCOPY (EGD);  Surgeon: Shane Darling, MD;  Location: [facility] END[facility]COPY;  Service: Gastroenterology;  Laterality: N/A;   LEFT HEART CATH AND CORONARY ANGIOGRAPHY N/A 02/24/2022   Procedure: LEFT HEART CATH AND CORONARY ANGIOGRAPHY;  Surgeon: Wenona Hamilton, MD;  Location: [facility] INVASIVE CV LAB;  Service: Cardiovascular;  Laterality: N/A;   RHINOPLASTY  2002   SEPTOPLASTY N/A 01/30/2021   Procedure: SEPTOPLASTY;  Surgeon: Von Grumbling, MD;  Location: [facility];  Service: ENT;  Laterality: N/A;   UPPER GI ENDOSCOPY  2008   Family History  Problem Relation Age of Onset   Hypertension Mother    Hyperlipidemia Mother    Hypothyroidism Mother    Fibromyalgia Mother    Hearing loss Mother    Lung cancer Father    Coronary artery disease Father     Hyperlipidemia Father    Hypertension Father    Heart attack Father    Hearing loss Sister    Hyperlipidemia Sister    Hypertension Sister    Fibromyalgia Sister    Hemochromatosis Sister    ADD / ADHD Brother    Breast cancer Neg Hx    Social History   Socioeconomic History   Marital status: Married    Spouse name: Not on file   Number of children: 1   Years of education: Not on file   Highest education level: High school graduate  Occupational History   Occupation: retired  Tobacco Use   Smoking status: Every Day    Current packs/day: 1.00    Average packs/day: 1 pack/day for 54.3 years (54.3 ttl pk-yrs)    Types: Cigarettes    Start date: 1971   Smokeless tobacco: Never   Tobacco comments:    Smoked 1 PPD- hfb RN 08/27/23  Vaping Use   Vaping status: Never Used  Substance and Sexual Activity   Alcohol use: Not Currently    Alcohol/week: 0.0 standard drinks of alcohol   Drug use: No   Sexual activity: Yes    Birth control/protection: Surgical  Other Topics Concern   Not on file  Social History Narrative   Not on file   Social Drivers of Health   Financial Resource Strain: Low Risk  (10/08/2023)   Overall Financial Resource Strain (CARDIA)    Difficulty of Paying Living Expenses: Not hard at all  Food Insecurity: No Food Insecurity (10/08/2023)   Hunger Vital Sign    Worried About Running Out of Food in the Last Year: Never true    Ran Out of Food in the Last Year: Never true  Transportation Needs: No Transportation Needs (10/08/2023)   PRAPARE - Administrator, Civil Service (Medical): No    Lack of Transportation (Non-Medical): No  Physical Activity: Inactive (10/08/2023)   Exercise Vital Sign    Days of Exercise per Week: 0 days    Minutes of Exercise per Session: 0 min  Stress: No Stress Concern Present (10/08/2023)   [employer] of Occupational Health - Occupational Stress Questionnaire    Feeling of Stress : Only a little  Social  Connections: Moderately Integrated (10/08/2023)   Social Connection and Isolation Panel [NHANES]    Frequency of Communication with Friends and Family: More than three times a week    Frequency of Social Gatherings with Friends and Family: Never    Attends Religious Services: More than 4 times per year    Active Member of Clubs or Organizations: No  Attends Banker Meetings: Never    Marital Status: Married    Tobacco Counseling Ready to quit: Not Answered Counseling given: Not Answered Tobacco comments: Smoked 1 PPD- hfb RN 08/27/23    Clinical Intake:  Pre-visit preparation completed: Yes  Pain : 0-10 Pain Score: 4  Pain Type: Chronic pain Pain Location: Shoulder Pain Orientation: Right Pain Descriptors / Indicators: Aching, Discomfort, Constant Pain Onset: More than a month ago Pain Frequency: Intermittent Pain Relieving Factors: percocet; needs shoulder replacement in July  Pain Relieving Factors: percocet; needs shoulder replacement in July  BMI - recorded: 23.4 Nutritional Status: BMI of 19-24  Normal Nutritional Risks: None Diabetes: No  Lab Results  Component Value Date   HGBA1C 6.0 (H) 02/24/2022   HGBA1C 6.0 (H) 08/09/2021   HGBA1C 5.6 02/05/2021     How often do you need to have someone help you when you read instructions, pamphlets, or other written materials from your doctor or pharmacy?: 1 - Never  Interpreter Needed?: No  Information entered by :: Dellie Fergusson, LPN   Activities of Daily Living     10/08/2023   11:04 AM  In your present state of health, do you have any difficulty performing the following activities:  Hearing? 0  Vision? 0  Difficulty concentrating or making decisions? 0  Walking or climbing stairs? 1  Dressing or bathing? 0  Doing errands, shopping? 0  Preparing Food and eating ? N  Using the Toilet? N  In the past six months, have you accidently leaked urine? N  Do you have problems with loss of bowel  control? N  Managing your Medications? N  Managing your Finances? N  Housekeeping or managing your Housekeeping? N    Patient Care Team: Mazie Speed, MD as PCP - General (Family Medicine) Constancia Delton, MD as PCP - Cardiology (Cardiology) Gwyn Leos, MD as Consulting Physician (Internal Medicine) Berton Brock, MD as Consulting Physician (Internal Medicine) Hodges Lunch, NP as Nurse Practitioner (Pain Medicine) Pa, [facility] (Optometry) Adolphus Akin, MD as Consulting Physician (Rheumatology) Rosan Comfort, MD as Consulting Physician (Neurology) Geraline Knapp, MD (Urology) Rande Bushy, MD as Referring Physician (Orthopedic Surgery) Raina Bunting, MD as Referring Physician (Pain Medicine) Jordan, Jimmy J, MD as Referring Physician (Physical Medicine and Rehabilitation) Von Grumbling, MD as Referring Physician (Otolaryngology) [name], MD as Consulting Physician (Pulmonary Disease)  Indicate any recent Medical Services you may have received from other than [facility] providers in the past year (date may be approximate).     Assessment:   This is a routine wellness examination for Tariah.  Hearing/Vision screen Hearing Screening - Comments:: NO AIDS Vision Screening - Comments:: WEARS GLASSES ALL DAY-  EYE   Goals Addressed             This Visit's Progress    DIET - INCREASE WATER INTAKE         Depression Screen     10/08/2023   11:02 AM 08/15/2022    1:49 PM 03/06/2022    2:20 PM 08/09/2021    1:16 PM 04/23/2021    8:40 AM 02/05/2021    1:48 PM 10/11/2020    8:36 AM  PHQ 2/9 Scores  PHQ - 2 Score 0 0 2 0 0 0 0  PHQ- 9 Score 0 0 4 4 0 3 0    Fall Risk     10/08/2023   11:04 AM 08/27/2023    1:43 PM  10/07/2022   10:59 AM 08/15/2022    1:49 PM 03/06/2022    2:20 PM  Fall Risk   Falls in the past year? 0 1 0 0 0  Number falls in past yr: 0 0 0 0 0  Injury with Fall? 0 1 0 0 0  Risk for fall due  to : No Fall Risks  No Fall Risks No Fall Risks No Fall Risks  Follow up Falls prevention discussed;Falls evaluation completed  Education provided;Falls prevention discussed  Falls evaluation completed    MEDICARE RISK AT HOME:  Medicare Risk at Home Any stairs in or around the home?: Yes If so, are there any without handrails?: No Home free of loose throw rugs in walkways, pet beds, electrical cords, etc?: Yes Adequate lighting in your home to reduce risk of falls?: Yes Life alert?: No Use of a cane, walker or w/c?: Yes (CANE OCCASIONALLY) Grab bars in the bathroom?: No Shower chair or bench in shower?: Yes Elevated toilet seat or a handicapped toilet?: No  TIMED UP AND GO:  Was the test performed?  No  Cognitive Function: 6CIT completed        10/08/2023   11:06 AM 10/07/2022   11:09 AM 02/05/2021    1:45 PM 09/13/2019    2:21 PM 07/08/2018    3:12 PM  6CIT Screen  What Year? 0 points 0 points 0 points 0 points 0 points  What month? 0 points 0 points 0 points 0 points 0 points  What time? 0 points 0 points 0 points 3 points 0 points  Count back from 20 0 points 0 points 0 points 0 points 0 points  Months in reverse 0 points 0 points 0 points 0 points 0 points  Repeat phrase 0 points 0 points 2 points 0 points 2 points  Total Score 0 points 0 points 2 points 3 points 2 points    Immunizations Immunization History  Administered Date(s) Administered   Fluad Quad(high Dose 65+) 04/23/2021, 08/15/2022   Fluad Trivalent(High Dose 65+) 07/30/2023   Influenza Split 02/21/2010, 04/22/2012, 07/15/2016   Influenza, High Dose Seasonal PF 03/05/2017, 07/08/2018   Influenza,inj,Quad PF,6+ Mos 03/23/2015   Moderna Sars-Covid-2 Vaccination 07/23/2019, 08/20/2019   Pneumococcal Conjugate-13 01/15/2017   Pneumococcal Polysaccharide-23 04/22/2012, 07/08/2018   Tdap 02/21/2010   Zoster, Live 11/04/2013    Screening Tests Health Maintenance  Topic Date Due   Zoster Vaccines-  Shingrix (1 of 2) 12/03/1970   DTaP/Tdap/Td (2 - Td or Tdap) 02/22/2020   DEXA SCAN  09/05/2021   COVID-19 Vaccine (3 - Moderna risk series) 12/26/2023 (Originally 09/17/2019)   INFLUENZA VACCINE  01/16/2024   MAMMOGRAM  03/06/2024   Lung Cancer Screening  08/19/2024   Medicare Annual Wellness (AWV)  10/07/2024   Colonoscopy  11/20/2027   Pneumonia Vaccine 64+ Years old  Completed   Hepatitis C Screening  Completed   HPV VACCINES  Aged Out   Meningococcal B Vaccine  Aged Out    Health Maintenance  Health Maintenance Due  Topic Date Due   Zoster Vaccines- Shingrix (1 of 2) 12/03/1970   DTaP/Tdap/Td (2 - Td or Tdap) 02/22/2020   DEXA SCAN  09/05/2021   Health Maintenance Items Addressed: Mammogram ordered, BDS ORDERED, COLONOSCOPY ORDERED; SHOTS WERE UP TO DATE EXCEPT TDAP, WANTS NO MORE COVID SHOTS  Additional Screening:  Vision Screening: Recommended annual ophthalmology exams for early detection of glaucoma and other disorders of the eye.  Dental Screening: Recommended annual dental exams for  proper oral hygiene  Community Resource Referral / Chronic Care Management: CRR required this visit?  No   CCM required this visit?  No     Plan:     I have personally reviewed and noted the following in the patient's chart:   Medical and social history Use of alcohol, tobacco or illicit drugs  Current medications and supplements including opioid prescriptions. Patient is not currently taking opioid prescriptions. Functional ability and status Nutritional status Physical activity Advanced directives List of other physicians Hospitalizations, surgeries, and ER visits in previous 12 months Vitals Screenings to include cognitive, depression, and falls Referrals and appointments  In addition, I have reviewed and discussed with patient certain preventive protocols, quality metrics, and best practice recommendations. A written personalized care plan for preventive services as  well as general preventive health recommendations were provided to patient.     Pinky Bright, LPN   1/61/0960   After Visit Summary: (MyChart) Due to this being a telephonic visit, the after visit summary with patients personalized plan was offered to patient via MyChart   Notes:  MAMMOGRAM, COLONOSCOPY & BDS ORDERED

## 2023-10-08 NOTE — Patient Instructions (Addendum)
 Ms. Rhee , Thank you for taking time to come for your Medicare Wellness Visit. I appreciate your ongoing commitment to your health goals. Please review the following plan we discussed and let me know if I can assist you in the future.   Referrals/Orders/Follow-Ups/Clinician Recommendations: MAMMOGRAM, BONE DENSITY, & COLONOSCOPY ORDERED  You have an order for:  []   2D Mammogram  [x]   3D Mammogram  []   Bone Density     Please call for appointment:  [facility]  [address] [place] [address] [phone] [facility] [address] [place], [place] [address] [phone] [facility] [address] Tracey Friday [place], [place] [address] [phone]   Make sure to wear two-piece clothing.  No lotions, powders, or deodorants the day of the appointment. Make sure to bring picture ID and insurance card.  Bring list of medications you are currently taking including any supplements.   Schedule your [place] screening mammogram through MyChart!   Log into your MyChart account.  Go to 'Visit' (or 'Appointments' if on mobile App) --> Schedule an Appointment  Under 'Select a Reason for Visit' choose the Mammogram Screening option.  Complete the pre-visit questions and select the time and place that best fits your schedule.   This is a list of the screening recommended for you and due dates:  Health Maintenance  Topic Date Due   Zoster (Shingles) Vaccine (1 of 2) 12/03/1970   DTaP/Tdap/Td vaccine (2 - Td or Tdap) 02/22/2020   DEXA scan (bone density measurement)  09/05/2021   COVID-19 Vaccine (3 - Moderna risk series) 12/26/2023*   Flu Shot  01/16/2024   Mammogram  03/06/2024   Screening for Lung Cancer  08/19/2024   Medicare Annual Wellness Visit  10/07/2024   Colon Cancer Screening  11/20/2027   Pneumonia Vaccine  Completed   Hepatitis C Screening   Completed   HPV Vaccine  Aged Out   Meningitis B Vaccine  Aged Out  *Topic was postponed. The date shown is not the original due date.    Advanced directives: (ACP Link)Information on Advanced Care Planning can be found at [place]  Secretary of [facility] Advance Health Care Directives Advance Health Care Directives. [email or web address]   Next Medicare Annual Wellness Visit scheduled for next year: Yes   10/13/24 @ 10:10 AM BY PHONE

## 2023-10-16 DIAGNOSIS — Z0189 Encounter for other specified special examinations: Secondary | ICD-10-CM | POA: Diagnosis not present

## 2023-10-18 ENCOUNTER — Encounter: Payer: Self-pay | Admitting: Student in an Organized Health Care Education/Training Program

## 2023-10-21 DIAGNOSIS — M539 Dorsopathy, unspecified: Secondary | ICD-10-CM | POA: Diagnosis not present

## 2023-10-21 DIAGNOSIS — L409 Psoriasis, unspecified: Secondary | ICD-10-CM | POA: Diagnosis not present

## 2023-10-21 DIAGNOSIS — Z7960 Long term (current) use of unspecified immunomodulators and immunosuppressants: Secondary | ICD-10-CM | POA: Diagnosis not present

## 2023-10-21 DIAGNOSIS — L4050 Arthropathic psoriasis, unspecified: Secondary | ICD-10-CM | POA: Diagnosis not present

## 2023-10-21 DIAGNOSIS — M797 Fibromyalgia: Secondary | ICD-10-CM | POA: Diagnosis not present

## 2023-10-22 DIAGNOSIS — H2513 Age-related nuclear cataract, bilateral: Secondary | ICD-10-CM | POA: Diagnosis not present

## 2023-10-22 DIAGNOSIS — H25013 Cortical age-related cataract, bilateral: Secondary | ICD-10-CM | POA: Diagnosis not present

## 2023-10-27 ENCOUNTER — Ambulatory Visit
Admission: RE | Admit: 2023-10-27 | Discharge: 2023-10-27 | Disposition: A | Discharge status: Home / Self Care | Source: Ambulatory Visit | Attending: Student in an Organized Health Care Education/Training Program | Admitting: Student in an Organized Health Care Education/Training Program

## 2023-10-27 DIAGNOSIS — R911 Solitary pulmonary nodule: Secondary | ICD-10-CM | POA: Insufficient documentation

## 2023-10-27 DIAGNOSIS — J849 Interstitial pulmonary disease, unspecified: Secondary | ICD-10-CM | POA: Diagnosis not present

## 2023-10-27 DIAGNOSIS — R918 Other nonspecific abnormal finding of lung field: Secondary | ICD-10-CM | POA: Diagnosis not present

## 2023-10-27 DIAGNOSIS — I2510 Atherosclerotic heart disease of native coronary artery without angina pectoris: Secondary | ICD-10-CM | POA: Diagnosis not present

## 2023-10-27 DIAGNOSIS — I700 Atherosclerosis of aorta: Secondary | ICD-10-CM | POA: Diagnosis not present

## 2023-10-27 DIAGNOSIS — I517 Cardiomegaly: Secondary | ICD-10-CM | POA: Diagnosis not present

## 2023-10-27 MED ORDER — IOHEXOL 350 MG/ML SOLN
75.0000 mL | Freq: Once | INTRAVENOUS | Status: AC | PRN
  Administered 2023-10-27: 75 mL via INTRAVENOUS

## 2023-10-28 ENCOUNTER — Ambulatory Visit (INDEPENDENT_AMBULATORY_CARE_PROVIDER_SITE_OTHER): Admitting: Student in an Organized Health Care Education/Training Program

## 2023-10-28 ENCOUNTER — Encounter: Payer: Self-pay | Admitting: Student in an Organized Health Care Education/Training Program

## 2023-10-28 VITALS — BP 118/70 | HR 104 | Temp 97.1°F | Ht 59.0 in | Wt 114.8 lb

## 2023-10-28 DIAGNOSIS — R911 Solitary pulmonary nodule: Secondary | ICD-10-CM

## 2023-10-28 DIAGNOSIS — J449 Chronic obstructive pulmonary disease, unspecified: Secondary | ICD-10-CM

## 2023-10-28 MED ORDER — ALBUTEROL SULFATE HFA 108 (90 BASE) MCG/ACT IN AERS: 2.0000 | INHALATION_SPRAY | Freq: Four times a day (QID) | RESPIRATORY_TRACT | 2 refills | Status: DC | PRN

## 2023-10-28 NOTE — Progress Notes (Unsigned)
 Synopsis: Referred in *** by Mazie Speed, MD  Assessment & Plan:   1. Lung nodule (Primary) *** - CT CHEST WO CONTRAST; Future  2. Chronic obstructive pulmonary disease, unspecified COPD type (HCC) *** - albuterol  (VENTOLIN  HFA) 108 (90 Base) MCG/ACT inhaler; Inhale 2 puffs into the lungs every 6 (six) hours as needed for wheezing or shortness of breath.  Dispense: 8 g; Refill: 2  One episode of SOB, better with cough. Felt better with albuterol . Baseline symptoms resp wise, but her multiple joint pains are much worse. Follows with rheumatology, simponi  Dennard Fisher not working. Discussed nodule, re-biopsy vs repeat CT, will do another scan in 6 months (lots of macrophages). ILD conference discussion pending.  Return in about 6 months (around 04/29/2024).  I spent *** minutes caring for this patient today, including {EM billing:[id number]}  April Glasgow, MD [facility] 10/28/2023 2:08 PM    End of visit medications:  Meds ordered this encounter  Medications   albuterol  (VENTOLIN  HFA) 108 (90 Base) MCG/ACT inhaler    Sig: Inhale 2 puffs into the lungs every 6 (six) hours as needed for wheezing or shortness of breath.    Dispense:  8 g    Refill:  2     Current Outpatient Medications:    albuterol  (VENTOLIN  HFA) 108 (90 Base) MCG/ACT inhaler, Inhale 2 puffs into the lungs every 6 (six) hours as needed for wheezing or shortness of breath., Disp: 8 g, Rfl: 2   amLODipine  (NORVASC ) 5 MG tablet, TAKE 1 TABLET BY MOUTH DAILY, Disp: 90 tablet, Rfl: 1   azelastine (ASTELIN) 0.1 % nasal spray, Place 1 spray into both nostrils daily as needed., Disp: , Rfl:    Bempedoic Acid-Ezetimibe  (NEXLIZET ) 180-10 MG TABS, Take 1 tablet by mouth daily., Disp: 30 tablet, Rfl: 3   clobetasol  ointment (TEMOVATE ) 0.05 %, APP TOPICALLY TO GLUTEAL AREA ONCE D PRF DERMATITIS, Disp: 30 g, Rfl: 1   DULoxetine  (CYMBALTA ) 20 MG capsule, Cymbalta  20 mg capsule,delayed release  Take 1 capsule  twice a day by oral route., Disp: , Rfl:    escitalopram  (LEXAPRO ) 20 MG tablet, TAKE ONE TABLET BY MOUTH EVERY DAY, Disp: 90 tablet, Rfl: 1   fluticasone  (FLONASE ) 50 MCG/ACT nasal spray, Place 2 sprays into both nostrils as needed for allergies or rhinitis., Disp: , Rfl:    gabapentin  (NEURONTIN ) 100 MG capsule, Take 100 mg by mouth 2 (two) times daily as needed., Disp: , Rfl:    gabapentin  (NEURONTIN ) 300 MG capsule, Take 300 mg by mouth as needed (qhs). PRN, Disp: , Rfl:    loratadine (CLARITIN) 10 MG tablet, Take 10 mg by mouth daily., Disp: , Rfl:    losartan  (COZAAR ) 25 MG tablet, TAKE 1 TABLET BY MOUTH DAILY, Disp: 90 tablet, Rfl: 1   methocarbamol (ROBAXIN) 750 MG tablet, Take 750 mg by mouth every 8 (eight) hours as needed for muscle spasms., Disp: , Rfl:    nitroGLYCERIN  (NITROSTAT ) 0.4 MG SL tablet, Place 1 tablet (0.4 mg total) under the tongue every 5 (five) minutes as needed for chest pain., Disp: 30 tablet, Rfl: 12   ondansetron  (ZOFRAN -ODT) 4 MG disintegrating tablet, Take 1 tablet (4 mg total) by mouth every 8 (eight) hours as needed for nausea or vomiting., Disp: 20 tablet, Rfl: 0   oxyCODONE -acetaminophen  (PERCOCET) 10-325 MG tablet, Take 1 tablet by mouth every 4 (four) hours as needed for pain., Disp: , Rfl:    potassium chloride  (KLOR-CON  M) 10 MEQ tablet,  TAKE TWO TABLETS BY MOUTH EVERY DAY, Disp: 60 tablet, Rfl: 1   predniSONE  (DELTASONE ) 10 MG tablet, Take 10 mg by mouth daily., Disp: , Rfl:    Tiotropium Bromide-Olodaterol (STIOLTO RESPIMAT ) 2.5-2.5 MCG/ACT AERS, Inhale 2 puffs into the lungs daily., Disp: 60 each, Rfl: 12   traZODone  (DESYREL ) 100 MG tablet, TAKE 0.5-1 TABLET BY MOUTH AT BEDTIME. (Patient taking differently: Take 100 mg by mouth at bedtime as needed for sleep.), Disp: 30 tablet, Rfl: 5   triamcinolone  cream (KENALOG ) 0.1 %, Apply topically 2 (two) times daily as needed., Disp: , Rfl:    azithromycin  (ZITHROMAX ) 250 MG tablet, Take 1 tablet (250 mg total)  by mouth daily. Take first 2 tablets together, then 1 every day until finished. (Patient not taking: Reported on 10/28/2023), Disp: 6 tablet, Rfl: 0   diclofenac Sodium (VOLTAREN) 1 % GEL, Apply 2 g topically 4 (four) times daily. (Patient not taking: Reported on 10/28/2023), Disp: , Rfl:    gentamicin ointment (GARAMYCIN) 0.1 %, Apply 1 application  topically 3 (three) times daily. (Patient not taking: Reported on 10/28/2023), Disp: , Rfl:    Golimumab  (SIMPONI  ARIA IV), Inject into the vein every 8 (eight) weeks. Infusion for RA. (Patient not taking: Reported on 10/28/2023), Disp: , Rfl:  No current facility-administered medications for this visit.  Facility-Administered Medications Ordered in Other Visits:    albuterol  (PROVENTIL ) (2.5 MG/3ML) 0.083% nebulizer solution 2.5 mg, 2.5 mg, Nebulization, Once, April Glasgow, MD   Subjective:   PATIENT ID: April Best GENDER: female DOB: Nov 04, 1950, MRN: [id number]  Chief Complaint  Patient presents with   Follow-up    No SOB or wheezing. Cough with clear sputum.    HPI ***  Ancillary information including prior medications, full medical/surgical/family/social histories, and PFTs (when available) are listed below and have been reviewed.   ROS   Objective:   Vitals:   10/28/23 1316  BP: 118/70  Pulse: (!) 104  Temp: (!) 97.1 F (36.2 C)  SpO2: 93%  Weight: 114 lb 12.8 oz (52.1 kg)  Height: 4\' 11"  (1.499 m)   93% on *** LPM *** RA BMI Readings from Last 3 Encounters:  10/28/23 23.19 kg/m  09/05/23 23.47 kg/m  08/27/23 23.59 kg/m   Wt Readings from Last 3 Encounters:  10/28/23 114 lb 12.8 oz (52.1 kg)  09/05/23 116 lb 3.2 oz (52.7 kg)  08/27/23 116 lb 12.8 oz (53 kg)    Physical Exam    Ancillary Information    Past Medical History:  Diagnosis Date   Allergic rhinitis    Allergy    Annular oral lichen planus    Anxiety    Arthritis    Depression    Deviated nasal septum    nasal obstruction   Guaiac  positive stools    history   H. 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   History of palpitations    Hyperlipidemia    Hypertension    Migraine    Myocardial infarction (HCC)    Osteoarthritis    Osteoporosis    Panic disorder    Psoriasis    Psoriatic arthritis (HCC)    Rheumatoid arteritis (HCC)    Tinnitus      Family History  Problem Relation Age of Onset   Hypertension Mother    Hyperlipidemia Mother    Hypothyroidism Mother    Fibromyalgia Mother    Hearing loss Mother    Lung cancer  Father    Coronary artery disease Father    Hyperlipidemia Father    Hypertension Father    Heart attack Father    Hearing loss Sister    Hyperlipidemia Sister    Hypertension Sister    Fibromyalgia Sister    Hemochromatosis Sister    ADD / ADHD Brother    Breast cancer Neg Hx      Past Surgical History:  Procedure Laterality Date   ABDOMINAL HYSTERECTOMY  1983   Ovaries have been removed.   ABDOMINOPLASTY     APPENDECTOMY     BREAST BIOPSY Right    neg- core   BRONCHIAL BRUSHINGS  08/21/2023   Procedure: BRONCHOSCOPY, WITH BRUSH BIOPSY;  Surgeon: Annitta Kindler, MD;  Location: [facility];  Service: Pulmonary;;   BRONCHIAL NEEDLE ASPIRATION BIOPSY  08/21/2023   Procedure: BRONCHOSCOPY, WITH NEEDLE ASPIRATION BIOPSY;  Surgeon: Annitta Kindler, MD;  Location: [facility];  Service: Pulmonary;;   BRONCHIAL WASHINGS  08/21/2023   Procedure: IRRIGATION, BRONCHUS;  Surgeon: Annitta Kindler, MD;  Location: [facility];  Service: Pulmonary;;   COLONOSCOPY  03/2006   COLONOSCOPY WITH PROPOFOL  N/A 11/19/2017   Procedure: COLONOSCOPY WITH PROPOFOL ;  Surgeon: Toledo, Alphonsus Jeans, MD;  Location: [facility];  Service: Gastroenterology;  Laterality: N/A;   CORONARY/GRAFT ACUTE MI REVASCULARIZATION N/A 02/24/2022   Procedure: Coronary/Graft Acute MI Revascularization;  Surgeon: Wenona Hamilton, MD;  Location: [facility];   Service: Cardiovascular;  Laterality: N/A;   ENDOBRONCHIAL ULTRASOUND Bilateral 08/21/2023   Procedure: ENDOBRONCHIAL ULTRASOUND (EBUS);  Surgeon: Annitta Kindler, MD;  Location[facility];  Service: Pulmonary;  Laterality: Bilateral;   ENDOSCOPIC CONCHA BULLOSA RESECTION Right 01/30/2021   Procedure: ENDOSCOPIC CONCHA BULLOSA RESECTION;  Surgeon: Von Grumbling, MD;  Location: [facility];  Service: ENT;  Laterality: Right;   ESOPHAGOGASTRODUODENOSCOPY N/A 03/19/2022   Procedure: ESOPHAGOGASTRODUODENOSCOPY (EGD);  Surgeon: Shane Darling, MD;  Location: [facility] ENDOSCOPY;  Service: Gastroenterology;  Laterality: N/A;   LEFT HEART CATH AND CORONARY ANGIOGRAPHY N/A 02/24/2022   Procedure: LEFT HEART CATH AND CORONARY ANGIOGRAPHY;  Surgeon: Wenona Hamilton, MD;  Location: [facility];  Service: Cardiovascular;  Laterality: N/A;   RHINOPLASTY  2002   SEPTOPLASTY N/A 01/30/2021   Procedure: SEPTOPLASTY;  Surgeon: Von Grumbling, MD;  Location[facility];  Service: ENT;  Laterality: N/A;   UPPER GI ENDOSCOPY  2008    Social History   Socioeconomic History   Marital status: Married    Spouse name: Not on file   Number of children: 1   Years of education: Not on file   Highest education level: High school graduate  Occupational History   Occupation: retired  Tobacco Use   Smoking status: Every Day    Current packs/day: 1.00    Average packs/day: 1 pack/day for 54.4 years (54.4 ttl pk-yrs)    Types: Cigarettes    Start date: 1971   Smokeless tobacco: Never   Tobacco comments:    Smokes 1.75 PPD- khj 10/28/2023  Vaping Use   Vaping status: Never Used  Substance and Sexual Activity   Alcohol use: Not Currently    Alcohol/week: 0.0 standard drinks of alcohol   Drug use: No   Sexual activity: Yes    Birth control/protection: Surgical  Other Topics Concern   Not on file  Social History Narrative   Not on file   Social Drivers of Health   Financial  Resource Strain: Low Risk  (10/08/2023)   Overall Financial  Resource Strain (CARDIA)    Difficulty of Paying Living Expenses: Not hard at all  Food Insecurity: No Food Insecurity (10/08/2023)   Hunger Vital Sign    Worried About Running Out of Food in the Last Year: Never true    Ran Out of Food in the Last Year: Never true  Transportation Needs: No Transportation Needs (10/08/2023)   PRAPARE - Administrator, Civil Service (Medical): No    Lack of Transportation (Non-Medical): No  Physical Activity: Inactive (10/08/2023)   Exercise Vital Sign    Days of Exercise per Week: 0 days    Minutes of Exercise per Session: 0 min  Stress: No Stress Concern Present (10/08/2023)   [employer] of Occupational Health - Occupational Stress Questionnaire    Feeling of Stress : Only a little  Social Connections: Moderately Integrated (10/08/2023)   Social Connection and Isolation Panel [NHANES]    Frequency of Communication with Friends and Family: More than three times a week    Frequency of Social Gatherings with Friends and Family: Never    Attends Religious Services: More than 4 times per year    Active Member of [employer] or Organizations: No    Attends Banker Meetings: Never    Marital Status: Married  Catering manager Violence: Not At Risk (10/08/2023)   Humiliation, Afraid, Rape, and Kick questionnaire    Fear of Current or Ex-Partner: No    Emotionally Abused: No    Physically Abused: No    Sexually Abused: No     Allergies  Allergen Reactions   Enbrel [Etanercept] Hives    Other reaction(s): Muscle Cramps   Modafinil Rash   Augmentin  [Amoxicillin-Pot Clavulanate] Nausea And Vomiting    as stated (XR).   Cefdinir  Diarrhea and Other (See Comments)    Other reaction(s): GI intolerance, Other (see comments) Gives her Cdiff Gives her Cdiff    Lisinopril Cough   Macrobid  [Nitrofurantoin ] Hives     CBC    Component Value Date/Time   WBC 13.7 (H) 07/30/2023  1029   WBC 11.0 (H) 07/31/2022 1112   RBC 4.73 07/30/2023 1029   HGB 15.2 (H) 07/30/2023 1029   HGB 13.9 04/23/2021 0917   HCT 47.4 (H) 07/30/2023 1029   HCT 42.3 04/23/2021 0917   PLT 354 07/30/2023 1029   PLT 599 (H) 04/23/2021 0917   MCV 100.2 (H) 07/30/2023 1029   MCV 96 04/23/2021 0917   MCV 98 10/18/2013 1529   MCH 32.1 07/30/2023 1029   MCHC 32.1 07/30/2023 1029   RDW 13.1 07/30/2023 1029   RDW 11.3 (L) 04/23/2021 0917   RDW 12.3 10/18/2013 1529   LYMPHSABS 2.2 07/30/2023 1029   LYMPHSABS 3.2 (H) 04/23/2021 0917   LYMPHSABS 2.8 10/18/2013 1529   MONOABS 0.7 07/30/2023 1029   MONOABS 0.7 10/18/2013 1529   EOSABS 0.0 07/30/2023 1029   EOSABS 0.7 (H) 04/23/2021 0917   EOSABS 0.2 10/18/2013 1529   BASOSABS 0.1 07/30/2023 1029   BASOSABS 0.0 04/23/2021 0917   BASOSABS 0.2 (H) 10/18/2013 1529    Pulmonary Functions Testing Results:    Latest Ref Rng & Units 08/26/2023    4:16 PM  PFT Results  FVC-Pre L 1.60   FVC-Predicted Pre % 67   FVC-Post L 1.76   FVC-Predicted Post % 74   Pre FEV1/FVC % % 76   Post FEV1/FCV % % 78   FEV1-Pre L 1.22   FEV1-Predicted Pre % 68   FEV1-Post L  1.37   DLCO uncorrected ml/min/mmHg 13.68   DLCO UNC% % 84   DLVA Predicted % 91   TLC L 4.40   TLC % Predicted % 102   RV % Predicted % 131     Outpatient Medications Prior to Visit  Medication Sig Dispense Refill   amLODipine  (NORVASC ) 5 MG tablet TAKE 1 TABLET BY MOUTH DAILY 90 tablet 1   azelastine (ASTELIN) 0.1 % nasal spray Place 1 spray into both nostrils daily as needed.     Bempedoic Acid-Ezetimibe  (NEXLIZET ) 180-10 MG TABS Take 1 tablet by mouth daily. 30 tablet 3   clobetasol  ointment (TEMOVATE ) 0.05 % APP TOPICALLY TO GLUTEAL AREA ONCE D PRF DERMATITIS 30 g 1   DULoxetine  (CYMBALTA ) 20 MG capsule Cymbalta  20 mg capsule,delayed release  Take 1 capsule twice a day by oral route.     escitalopram  (LEXAPRO ) 20 MG tablet TAKE ONE TABLET BY MOUTH EVERY DAY 90 tablet 1    fluticasone  (FLONASE ) 50 MCG/ACT nasal spray Place 2 sprays into both nostrils as needed for allergies or rhinitis.     gabapentin  (NEURONTIN ) 100 MG capsule Take 100 mg by mouth 2 (two) times daily as needed.     gabapentin  (NEURONTIN ) 300 MG capsule Take 300 mg by mouth as needed (qhs). PRN     loratadine (CLARITIN) 10 MG tablet Take 10 mg by mouth daily.     losartan  (COZAAR ) 25 MG tablet TAKE 1 TABLET BY MOUTH DAILY 90 tablet 1   methocarbamol (ROBAXIN) 750 MG tablet Take 750 mg by mouth every 8 (eight) hours as needed for muscle spasms.     nitroGLYCERIN  (NITROSTAT ) 0.4 MG SL tablet Place 1 tablet (0.4 mg total) under the tongue every 5 (five) minutes as needed for chest pain. 30 tablet 12   ondansetron  (ZOFRAN -ODT) 4 MG disintegrating tablet Take 1 tablet (4 mg total) by mouth every 8 (eight) hours as needed for nausea or vomiting. 20 tablet 0   oxyCODONE -acetaminophen  (PERCOCET) 10-325 MG tablet Take 1 tablet by mouth every 4 (four) hours as needed for pain.     potassium chloride  (KLOR-CON  M) 10 MEQ tablet TAKE TWO TABLETS BY MOUTH EVERY DAY 60 tablet 1   predniSONE  (DELTASONE ) 10 MG tablet Take 10 mg by mouth daily.     Tiotropium Bromide-Olodaterol (STIOLTO RESPIMAT ) 2.5-2.5 MCG/ACT AERS Inhale 2 puffs into the lungs daily. 60 each 12   traZODone  (DESYREL ) 100 MG tablet TAKE 0.5-1 TABLET BY MOUTH AT BEDTIME. (Patient taking differently: Take 100 mg by mouth at bedtime as needed for sleep.) 30 tablet 5   triamcinolone  cream (KENALOG ) 0.1 % Apply topically 2 (two) times daily as needed.     predniSONE  (DELTASONE ) 20 MG tablet Take 2 tablets (40 mg total) by mouth daily. 10 tablet 0   azithromycin  (ZITHROMAX ) 250 MG tablet Take 1 tablet (250 mg total) by mouth daily. Take first 2 tablets together, then 1 every day until finished. (Patient not taking: Reported on 10/28/2023) 6 tablet 0   diclofenac Sodium (VOLTAREN) 1 % GEL Apply 2 g topically 4 (four) times daily. (Patient not taking: Reported  on 10/28/2023)     gentamicin ointment (GARAMYCIN) 0.1 % Apply 1 application  topically 3 (three) times daily. (Patient not taking: Reported on 10/28/2023)     Golimumab  (SIMPONI  ARIA IV) Inject into the vein every 8 (eight) weeks. Infusion for RA. (Patient not taking: Reported on 10/28/2023)     Facility-Administered Medications Prior to Visit  Medication Dose Route  Frequency Provider Last Rate Last Admin   albuterol  (PROVENTIL ) (2.5 MG/3ML) 0.083% nebulizer solution 2.5 mg  2.5 mg Nebulization Once Terrionna Bridwell, MD

## 2023-10-31 ENCOUNTER — Telehealth: Payer: Self-pay | Admitting: Student in an Organized Health Care Education/Training Program

## 2023-10-31 NOTE — Telephone Encounter (Signed)
 Dr. Darnelle Elders,  Per Dr. Bertrum Brodie this patient's case was not discussed at the ILD conference in April. Sending this as FYI.   CC'ing Dr. Bertrum Brodie as requested.

## 2023-11-04 ENCOUNTER — Encounter (INDEPENDENT_AMBULATORY_CARE_PROVIDER_SITE_OTHER): Payer: Self-pay

## 2023-11-05 DIAGNOSIS — M5416 Radiculopathy, lumbar region: Secondary | ICD-10-CM | POA: Diagnosis not present

## 2023-11-13 DIAGNOSIS — G5603 Carpal tunnel syndrome, bilateral upper limbs: Secondary | ICD-10-CM | POA: Diagnosis not present

## 2023-11-13 DIAGNOSIS — G939 Disorder of brain, unspecified: Secondary | ICD-10-CM | POA: Diagnosis not present

## 2023-11-13 DIAGNOSIS — G500 Trigeminal neuralgia: Secondary | ICD-10-CM | POA: Diagnosis not present

## 2023-11-13 DIAGNOSIS — I6381 Other cerebral infarction due to occlusion or stenosis of small artery: Secondary | ICD-10-CM | POA: Diagnosis not present

## 2023-11-13 DIAGNOSIS — F1091 Alcohol use, unspecified, in remission: Secondary | ICD-10-CM | POA: Diagnosis not present

## 2023-11-13 DIAGNOSIS — Z1331 Encounter for screening for depression: Secondary | ICD-10-CM | POA: Diagnosis not present

## 2023-11-17 ENCOUNTER — Other Ambulatory Visit: Payer: Self-pay | Admitting: Neurology

## 2023-11-17 DIAGNOSIS — R202 Paresthesia of skin: Secondary | ICD-10-CM

## 2023-11-18 ENCOUNTER — Ambulatory Visit
Admission: RE | Admit: 2023-11-18 | Discharge: 2023-11-18 | Disposition: A | Discharge status: Home / Self Care | Source: Ambulatory Visit | Attending: Neurology | Admitting: Neurology

## 2023-11-18 DIAGNOSIS — K146 Glossodynia: Secondary | ICD-10-CM | POA: Insufficient documentation

## 2023-11-18 DIAGNOSIS — K08409 Partial loss of teeth, unspecified cause, unspecified class: Secondary | ICD-10-CM | POA: Insufficient documentation

## 2023-11-18 DIAGNOSIS — R200 Anesthesia of skin: Secondary | ICD-10-CM | POA: Diagnosis not present

## 2023-11-18 DIAGNOSIS — R202 Paresthesia of skin: Secondary | ICD-10-CM | POA: Insufficient documentation

## 2023-11-18 DIAGNOSIS — I6782 Cerebral ischemia: Secondary | ICD-10-CM | POA: Diagnosis not present

## 2023-11-18 MED ORDER — GADOBUTROL 1 MMOL/ML IV SOLN
5.0000 mL | Freq: Once | INTRAVENOUS | Status: AC | PRN
  Administered 2023-11-18: 5 mL via INTRAVENOUS

## 2023-11-19 ENCOUNTER — Encounter: Payer: Self-pay | Admitting: Internal Medicine

## 2023-11-19 DIAGNOSIS — M5481 Occipital neuralgia: Secondary | ICD-10-CM | POA: Diagnosis not present

## 2023-11-19 DIAGNOSIS — M5416 Radiculopathy, lumbar region: Secondary | ICD-10-CM | POA: Diagnosis not present

## 2023-11-19 DIAGNOSIS — Z79899 Other long term (current) drug therapy: Secondary | ICD-10-CM | POA: Diagnosis not present

## 2023-11-19 DIAGNOSIS — M25511 Pain in right shoulder: Secondary | ICD-10-CM | POA: Diagnosis not present

## 2023-11-19 DIAGNOSIS — M5412 Radiculopathy, cervical region: Secondary | ICD-10-CM | POA: Diagnosis not present

## 2023-11-19 DIAGNOSIS — R110 Nausea: Secondary | ICD-10-CM | POA: Diagnosis not present

## 2023-11-19 DIAGNOSIS — M810 Age-related osteoporosis without current pathological fracture: Secondary | ICD-10-CM | POA: Diagnosis not present

## 2023-11-19 DIAGNOSIS — M8438XA Stress fracture, other site, initial encounter for fracture: Secondary | ICD-10-CM | POA: Diagnosis not present

## 2023-11-19 DIAGNOSIS — G894 Chronic pain syndrome: Secondary | ICD-10-CM | POA: Diagnosis not present

## 2023-11-21 NOTE — Progress Notes (Signed)
 Interstitial Lung Disease Multidisciplinary Conference   DELENA CASEBEER    MRN [id number]    DOB [birth year]  Primary Care Physician:Bacigalupo, Stan Eans, MD  Referring Physician: Dr. Darnelle Elders  Time of Conference: 7.30am- 8.30am Date of conference: 11/19/2023 Location of Conference: -  Virtual  Participating Pulmonary: Dr. Maire Scot Pathology: - Radiology: Dr Karlyn Overman MD  Others: n/a  Brief History: 70 female with history of psoriatic arthritis on Golimumab  presented with a RLL pulmonary nodule. Also a smoker. CT is also notable for ILD. Robotic nav to nodule negative for malignancy (in the process of further working this up). Patient's doesn't have much in way of symptoms from her ILD. Rheumatology treating psoriatic arthritis, previous ANA was 1:1280 in 2021, CCP was mildly positive at 27 in 2021. Rheum seems to think these were false positives. 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 I'm repeating the MDA5 to see if it is still positive.   PFT    Latest Ref Rng & Units 08/26/2023    4:16 PM  PFT Results  FVC-Pre L 1.60   FVC-Predicted Pre % 67   FVC-Post L 1.76   FVC-Predicted Post % 74   Pre FEV1/FVC % % 76   Post FEV1/FCV % % 78   FEV1-Pre L 1.22   FEV1-Predicted Pre % 68   FEV1-Post L 1.37   DLCO uncorrected ml/min/mmHg 13.68   DLCO UNC% % 84   DLVA Predicted % 91   TLC L 4.40   TLC % Predicted % 102   RV % Predicted % 131       MDD discussion of CT scan    - Date or time period of scan:  Super D CT Chest: 08/20/2023 HRCT: 07/15/2023 LDCT: 04/29/2023 LDCT: 08/08/2020 LDCT: 07/21/2018  - Discussion synopsis:  paraseptal emphysema with prob UIP . per Dr Debara Faden. NOt progressivne x 3 years  - What is the final conclusion per 2018 ATS/Fleischner Criteria - Prob UIP  - Concordance with official report: concordnat with prior report  Pathology discussion of biopsy:  n/a    MDD Impression/Recs: MR saw [facility] study at ATS suggesin Psoriasis as incread RR for ILD. This sound like CTD-ILD - prob myosiits or even Psorias or ILA. REcommend: ILD doc eval in ILD center at [facility] and then also 2nd opinon rheum.  Need to consider immune moodulators   Time Spent in preparation and discussion:  > 30 min    SIGNATURE   Dr. Maire Scot, M.D., F.C.C.P,  Pulmonary and Critical Care Medicine Staff Physician, [facility] Director - Interstitial Lung Disease  Program  Pulmonary Fibrosis [facility] at [facility] [place], [place], [address]  Pager: [phone], If no answer or between  15:00h - 7:00h: call [phone] Telephone: [phone]  8:52 AM 11/21/2023 ...................................................................................................................Aaron Aas References: Diagnosis of Hypersensitivity Pneumonitis in Adults. An Official ATS/JRS/ALAT Clinical Practice Guideline. Ragu G et al, Am J Respir Crit Care Med. 2020 Aug 1;202(3):e36-e69.       Diagnosis of Idiopathic Pulmonary Fibrosis. An Official ATS/ERS/JRS/ALAT Clinical Practice Guideline. Raghu G et al, Am J Respir Crit Care Med. 2018 Sep 1;198(5):e44-e68.   IPF Suspected   Histopath ology Pattern      UIP  Probable UIP  Indeterminate for  UIP  Alternative  diagnosis    UIP  IPF  IPF  IPF  Non-IPF dx  HRCT   Probabe UIP  IPF  IPF  IPF (Likely)**  Non-IPF dx  Pattern  Indeterminate for UIP  IPF  IPF (Likely)**  Indeterminate  for IPF**  Non-IPF dx    Alternative diagnosis  IPF (Likely)**/ non-IPF dx  Non-IPF dx  Non-IPF dx  Non-IPF dx     Idiopathic pulmonary fibrosis diagnosis based upon HRCT and Biopsy paterns.  ** IPF is the likely diagnosis when any of following features are present:  Moderate-to-severe traction bronchiectasis/bronchiolectasis (defined as mild traction  bronchiectasis/bronchiolectasis in four or more lobes including the lingual as a lobe, or moderate to severe traction bronchiectasis in two or more lobes) in a man over age 79 years or in a woman over age 28 years Extensive (>30%) reticulation on HRCT and an age >70 years  Increased neutrophils and/or absence of lymphocytosis in BAL fluid  Multidisciplinary discussion reaches a confident diagnosis of IPF.   **Indeterminate for IPF  Without an adequate biopsy is unlikely to be IPF  With an adequate biopsy may be reclassified to a more specific diagnosis after multidisciplinary discussion and/or additional consultation.   dx = diagnosis; HRCT = high-resolution computed tomography; IPF = idiopathic pulmonary fibrosis; UIP = usual interstitial pneumonia.

## 2023-11-25 DIAGNOSIS — M25511 Pain in right shoulder: Secondary | ICD-10-CM | POA: Diagnosis not present

## 2023-12-04 DIAGNOSIS — M810 Age-related osteoporosis without current pathological fracture: Secondary | ICD-10-CM | POA: Diagnosis not present

## 2023-12-09 DIAGNOSIS — M19011 Primary osteoarthritis, right shoulder: Secondary | ICD-10-CM | POA: Diagnosis not present

## 2023-12-09 DIAGNOSIS — Z01812 Encounter for preprocedural laboratory examination: Secondary | ICD-10-CM | POA: Diagnosis not present

## 2023-12-09 DIAGNOSIS — Z01818 Encounter for other preprocedural examination: Secondary | ICD-10-CM | POA: Diagnosis not present

## 2023-12-10 ENCOUNTER — Ambulatory Visit
Admission: RE | Admit: 2023-12-10 | Discharge: 2023-12-10 | Disposition: A | Discharge status: Home / Self Care | Source: Ambulatory Visit | Attending: Emergency Medicine | Admitting: Emergency Medicine

## 2023-12-10 VITALS — BP 140/88 | HR 89 | Temp 98.9°F | Resp 18

## 2023-12-10 DIAGNOSIS — R350 Frequency of micturition: Secondary | ICD-10-CM | POA: Insufficient documentation

## 2023-12-10 LAB — POCT URINALYSIS DIP (MANUAL ENTRY)
Bilirubin, UA: NEGATIVE
Glucose, UA: NEGATIVE mg/dL
Ketones, POC UA: NEGATIVE mg/dL
Nitrite, UA: NEGATIVE
Protein Ur, POC: 30 mg/dL — AB
Spec Grav, UA: >=1.03 — AB (ref 1.010–1.025)
Urobilinogen, UA: 0.2 U/dL
pH, UA: 5.5 (ref 5.0–8.0)

## 2023-12-10 MED ORDER — CIPROFLOXACIN HCL 500 MG PO TABS: 500.0000 mg | ORAL_TABLET | Freq: Two times a day (BID) | ORAL | 0 refills | Status: AC

## 2023-12-10 NOTE — ED Provider Notes (Signed)
 April Best    CSN: [id number] Arrival date & time: 12/10/23  1415      History   Chief Complaint Chief Complaint  Patient presents with   Urinary Frequency    I have a UTI - Entered by patient   Dysuria    HPI April Best is a 73 y.o. female.   Patient presents for evaluation of urinary frequency, dysuria and lower back pain beginning 2 days ago.  Has not attempted treatment.  Denies hematuria, abdominal pain, fever or vaginal symptoms.  Past Medical History:  Diagnosis Date   Allergic rhinitis    Allergy    Annular oral lichen planus    Anxiety    Arthritis    Depression    Deviated nasal septum    nasal obstruction   Guaiac positive stools    history   H. 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   History of palpitations    Hyperlipidemia    Hypertension    Migraine    Myocardial infarction [facility])    Osteoarthritis    Osteoporosis    Panic disorder    Psoriasis    Psoriatic arthritis (HCC)    Rheumatoid arteritis (HCC)    Tinnitus     Patient Active Problem List   Diagnosis Date Noted   Moderate smoker (20 or less per day) 08/26/2023   CVA (cerebral vascular accident) (HCC) 08/25/2023   AAA (abdominal aortic aneurysm) without rupture (HCC) 08/20/2023   Prediabetes 08/18/2023   Lung nodule seen on imaging study 07/30/2023   History of ST elevation myocardial infarction (STEMI) 03/06/2022   Gastroesophageal reflux disease    Chest pain of uncertain etiology 02/24/2022   Depression with anxiety 02/24/2022   RA (rheumatoid arthritis) (HCC) 02/24/2022   Upper abdominal pain 02/24/2022   Lung nodule 02/24/2022   Lichen planus of tongue 10/17/2021   Hyperthyroidism 10/17/2021   Wheezing 10/17/2021   Chronic obstructive pulmonary disease (HCC) 10/17/2021   Bilateral sacral insufficiency fracture 09/05/2021   Cervico-occipital neuralgia 09/05/2021   Hip pain 09/05/2021   Lumbar radiculopathy  09/05/2021   Nausea 09/05/2021   Osteoporosis 09/05/2021   Chronic pain syndrome 09/05/2021   Right facial numbness 08/09/2021   PAD (peripheral artery disease) (HCC) 08/09/2021   Recurrent major depressive disorder, in full remission (HCC) 08/09/2021   Senile purpura (HCC) 08/09/2021   Fibromyalgia 04/24/2021   Leukocytosis 04/23/2021   Hospital discharge follow-up 04/23/2021   Chronic fatigue 04/05/2021   Skin rash 04/05/2021   Hot flashes 04/05/2021   Pharyngoesophageal dysphagia 04/05/2021   Raised antibody titer 03/01/2020   Psoriatic arthritis (HCC) 01/25/2020   ANA positive 01/19/2020   Renal cyst 12/01/2019   Proteinuria 12/01/2019   Low back pain 07/12/2019   Pulmonary emphysema (HCC) 07/24/2018   Elevated glucose 02/13/2015   [name] (HCC) 12/14/2014   Family history of hemochromatosis 11/02/2014   Hypercholesteremia 11/02/2014   Essential hypertension 11/02/2014   Current tobacco use 11/02/2014   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   ABDOMINOPLASTY     APPENDECTOMY     BREAST BIOPSY Right    neg- core   BRONCHIAL BRUSHINGS  08/21/2023   Procedure: BRONCHOSCOPY, WITH BRUSH BIOPSY;  Surgeon: Malka Domino, MD;  Location: [facility];  Service: Pulmonary;;   BRONCHIAL NEEDLE ASPIRATION BIOPSY  08/21/2023  Procedure: BRONCHOSCOPY, WITH NEEDLE ASPIRATION BIOPSY;  Surgeon: Malka Domino, MD;  Location: [facility];  Service: Pulmonary;;   BRONCHIAL WASHINGS  08/21/2023   Procedure: IRRIGATION, BRONCHUS;  Surgeon: Malka Domino, MD;  Location: [facility];  Service: Pulmonary;;   COLONOSCOPY  03/2006   COLONOSCOPY WITH PROPOFOL  N/A 11/19/2017   Procedure: COLONOSCOPY WITH PROPOFOL ;  Surgeon: Toledo, Ladell POUR, MD;  Location: [facility] ENDOSCOPY;  Service: Gastroenterology;  Laterality: N/A;   CORONARY/GRAFT  ACUTE MI REVASCULARIZATION N/A 02/24/2022   Procedure: Coronary/Graft Acute MI Revascularization;  Surgeon: Darron Deatrice LABOR, MD;  Location: [facility];  Service: Cardiovascular;  Laterality: N/A;   ENDOBRONCHIAL ULTRASOUND Bilateral 08/21/2023   Procedure: ENDOBRONCHIAL ULTRASOUND (EBUS);  Surgeon: Malka Domino, MD;  Location: [facility] ENDOSCOPY;  Service: Pulmonary;  Laterality: Bilateral;   ENDOSCOPIC CONCHA BULLOSA RESECTION Right 01/30/2021   Procedure: ENDOSCOPIC CONCHA BULLOSA RESECTION;  Surgeon: Blair Mt, MD;  Location: [facility];  Service: ENT;  Laterality: Right;   ESOPHAGOGASTRODUODENOSCOPY N/A 03/19/2022   Procedure: ESOPHAGOGASTRODUODENOSCOPY (EGD);  Surgeon: Maryruth Ole DASEN, MD;  Location: [facility] ENDOSCOPY;  Service: Gastroenterology;  Laterality: N/A;   LEFT HEART CATH AND CORONARY ANGIOGRAPHY N/A 02/24/2022   Procedure: LEFT HEART CATH AND CORONARY ANGIOGRAPHY;  Surgeon: Darron Deatrice LABOR, MD;  Location: [facility] INV[facility];  Service: Cardiovascular;  Laterality: N/A;   RHINOPLASTY  2002   SEPTOPLASTY N/A 01/30/2021   Procedure: SEPTOPLASTY;  Surgeon: Blair Mt, MD;  Location: [facility];  Service: ENT;  Laterality: N/A;   UPPER GI ENDOSCOPY  2008    OB History     Gravida  1   Para  1   Term      Preterm      AB      Living         SAB      IAB      Ectopic      Multiple      Live Births               Home Medications    Prior to Admission medications   Medication Sig Start Date End Date Taking? Authorizing Provider  ciprofloxacin (CIPRO) 500 MG tablet Take 1 tablet (500 mg total) by mouth 2 (two) times daily for 3 days. 12/10/23 12/13/23 Yes Ashwin Tibbs, Shelba SAUNDERS, NP  albuterol  (VENTOLIN  HFA) 108 (90 Base) MCG/ACT inhaler Inhale 2 puffs into the lungs every 6 (six) hours as needed for wheezing or shortness of breath. 10/28/23   Isadora Hose, MD  amLODipine  (NORVASC ) 5 MG tablet TAKE 1 TABLET BY MOUTH DAILY  01/01/23   Bacigalupo, Angela M, MD  azelastine (ASTELIN) 0.1 % nasal spray Place 1 spray into both nostrils daily as needed.    [provider]  azithromycin  (ZITHROMAX ) 250 MG tablet Take 1 tablet (250 mg total) by mouth daily. Take first 2 tablets together, then 1 every day until finished. Patient not taking: Reported on 10/28/2023 09/09/23   Teresa Shelba SAUNDERS, NP  Bempedoic Acid-Ezetimibe  (NEXLIZET ) 180-10 MG TABS Take 1 tablet by mouth daily. 04/28/23   Darliss Rogue, MD  clobetasol  ointment (TEMOVATE ) 0.05 % APP TOPICALLY TO GLUTEAL AREA ONCE D PRF DERMATITIS 10/07/18   Ashok Kathrine HERO, PA-C  diclofenac Sodium (VOLTAREN) 1 % GEL Apply 2 g topically 4 (four) times daily. Patient not taking: Reported on 10/28/2023 09/07/20   [provider]  DULoxetine  (CYMBALTA ) 20 MG capsule Cymbalta  20 mg capsule,delayed release  Take 1 capsule twice a  day by oral route.    [provider]  escitalopram  (LEXAPRO ) 20 MG tablet TAKE ONE TABLET BY MOUTH EVERY DAY 10/16/22   Bacigalupo, Jon HERO, MD  fluticasone  (FLONASE ) 50 MCG/ACT nasal spray Place 2 sprays into both nostrils as needed for allergies or rhinitis. 05/07/21   [provider]  gabapentin  (NEURONTIN ) 100 MG capsule Take 100 mg by mouth 2 (two) times daily as needed. 11/05/19   [provider]  gabapentin  (NEURONTIN ) 300 MG capsule Take 300 mg by mouth as needed (qhs). PRN    [provider]  gentamicin ointment (GARAMYCIN) 0.1 % Apply 1 application  topically 3 (three) times daily. Patient not taking: Reported on 10/28/2023    [provider]  Golimumab  (SIMPONI  ARIA IV) Inject into the vein every 8 (eight) weeks. Infusion for RA. Patient not taking: Reported on 10/28/2023    [provider]  loratadine (CLARITIN) 10 MG tablet Take 10 mg by mouth daily.    [provider]  losartan  (COZAAR ) 25 MG tablet TAKE 1 TABLET BY MOUTH DAILY 11/28/22   Emilio Kelly DASEN, FNP   methocarbamol (ROBAXIN) 750 MG tablet Take 750 mg by mouth every 8 (eight) hours as needed for muscle spasms.    [provider]  nitroGLYCERIN  (NITROSTAT ) 0.4 MG SL tablet Place 1 tablet (0.4 mg total) under the tongue every 5 (five) minutes as needed for chest pain. 02/26/22   Patel, Sona, MD  ondansetron  (ZOFRAN -ODT) 4 MG disintegrating tablet Take 1 tablet (4 mg total) by mouth every 8 (eight) hours as needed for nausea or vomiting. 07/24/22   Gladis Elsie BROCKS, PA-C  oxyCODONE -acetaminophen  (PERCOCET) 10-325 MG tablet Take 1 tablet by mouth every 4 (four) hours as needed for pain.    [provider]  potassium chloride  (KLOR-CON  M) 10 MEQ tablet TAKE TWO TABLETS BY MOUTH EVERY DAY 06/24/23   Bacigalupo, Jon HERO, MD  predniSONE  (DELTASONE ) 10 MG tablet Take 10 mg by mouth daily. 10/25/23   [provider]  Tiotropium Bromide-Olodaterol (STIOLTO RESPIMAT ) 2.5-2.5 MCG/ACT AERS Inhale 2 puffs into the lungs daily. 08/27/23   Isadora Hose, MD  traZODone  (DESYREL ) 100 MG tablet TAKE 0.5-1 TABLET BY MOUTH AT BEDTIME. Patient taking differently: Take 100 mg by mouth at bedtime as needed for sleep. 10/21/22   Simmons-Robinson, Makiera, MD  triamcinolone  cream (KENALOG ) 0.1 % Apply topically 2 (two) times daily as needed. 04/02/21   [provider]    Family History Family History  Problem Relation Age of Onset   Hypertension Mother    Hyperlipidemia Mother    Hypothyroidism Mother    Fibromyalgia Mother    Hearing loss Mother    Lung cancer Father    Coronary artery disease Father    Hyperlipidemia Father    Hypertension Father    Heart attack Father    Hearing loss Sister    Hyperlipidemia Sister    Hypertension Sister    Fibromyalgia Sister    Hemochromatosis Sister    ADD / ADHD Brother    Breast cancer Neg Hx     Social History Social History   Tobacco Use   Smoking status: Every Day    Current packs/day: 1.00    Average packs/day: 1 pack/day  for 54.5 years (54.5 ttl pk-yrs)    Types: Cigarettes    Start date: 1971   Smokeless tobacco: Never   Tobacco comments:    Smokes 1.75 PPD- khj 10/28/2023  Vaping Use   Vaping status:  Never Used  Substance Use Topics   Alcohol use: Not Currently    Alcohol/week: 0.0 standard drinks of alcohol   Drug use: No     Allergies   Enbrel [etanercept], Modafinil, Augmentin  [amoxicillin-pot clavulanate], Cefdinir , Lisinopril, and Macrobid  [nitrofurantoin ]   Review of Systems Review of Systems   Physical Exam Triage Vital Signs ED Triage Vitals  Encounter Vitals Group     BP 12/10/23 1424 (!) 140/88     Girls Systolic BP Percentile --      Girls Diastolic BP Percentile --      Boys Systolic BP Percentile --      Boys Diastolic BP Percentile --      Pulse Rate 12/10/23 1424 89     Resp 12/10/23 1424 18     Temp 12/10/23 1424 98.9 F (37.2 C)     Temp Source 12/10/23 1424 Oral     SpO2 12/10/23 1424 96 %     Weight --      Height --      Head Circumference --      Peak Flow --      Pain Score 12/10/23 1426 6     Pain Loc --      Pain Education --      Exclude from Growth Chart --    No data found.  Updated Vital Signs BP (!) 140/88 (BP Location: Right Arm)   Pulse 89   Temp 98.9 F (37.2 C) (Oral)   Resp 18   SpO2 96%   Visual Acuity Right Eye Distance:   Left Eye Distance:   Bilateral Distance:    Right Eye Near:   Left Eye Near:    Bilateral Near:     Physical Exam Constitutional:      Appearance: Normal appearance.   Eyes:     Extraocular Movements: Extraocular movements intact.   Pulmonary:     Effort: Pulmonary effort is normal.  Abdominal:     General: Abdomen is flat. Bowel sounds are normal.     Palpations: Abdomen is soft.     Tenderness: There is abdominal tenderness in the suprapubic area. There is right CVA tenderness and left CVA tenderness.   Neurological:     Mental Status: She is alert and oriented to person, place, and time.       UC Treatments / Results  Labs (all labs ordered are listed, but only abnormal results are displayed) Labs Reviewed  POCT URINALYSIS DIP (MANUAL ENTRY) - Abnormal; Notable for the following components:      Result Value   Clarity, UA cloudy (*)    Spec Grav, UA >=1.030 (*)    Blood, UA moderate (*)    Protein Ur, POC =30 (*)    Leukocytes, UA Small (1+) (*)    All other components within normal limits  URINE CULTURE    EKG   Radiology No results found.  Procedures Procedures (including critical care time)  Medications Ordered in UC Medications - No data to display  Initial Impression / Assessment and Plan / UC Course  I have reviewed the triage vital signs and the nursing notes.  Pertinent labs & imaging results that were available during my care of the patient were reviewed by me and considered in my medical decision making (see chart for details).  Urinary frequency  Urinalysis showing leukocytes, negative for nitrates, sent for culture, empirically placed on ciprofloxacin as she is symptomatic and has CVA tenderness bilaterally, no signs of  sepsis, stable for outpatient management, recommended supportive care with follow-up as needed Final Clinical Impressions(s) / UC Diagnoses   Final diagnoses:  Urinary frequency     Discharge Instructions      Your urinalysis shows Huckleberry Martinson blood cells but does not show bacteria, your urine will be sent to the lab to determine exactly which bacteria is present, if any changes need to be made to your medications you will be notified  Begin use of ciprofloxacin twice daily for 5 days  You may use over-the-counter Azo to help minimize your symptoms until antibiotic removes bacteria, this medication will turn your urine orange  Increase your fluid intake through use of water  As always practice good hygiene, wiping front to back and avoidance of scented vaginal products to prevent further irritation  If symptoms continue  to persist after use of medication or recur please follow-up with urgent care or your primary doctor as needed    ED Prescriptions     Medication Sig Dispense Auth. Provider   ciprofloxacin (CIPRO) 500 MG tablet Take 1 tablet (500 mg total) by mouth 2 (two) times daily for 3 days. 6 tablet Brittony Billick R, NP      PDMP not reviewed this encounter.   Teresa Shelba SAUNDERS, [place] 12/10/23 [phone]

## 2023-12-10 NOTE — ED Triage Notes (Signed)
 Patient reports urinary frequency and painful urination x 2 days. States she has not taken anything for symptoms. Patient also complains of low back pain. Rates 6/10.

## 2023-12-10 NOTE — Discharge Instructions (Addendum)
 Your urinalysis shows April Best blood cells but does not show bacteria, your urine will be sent to the lab to determine exactly which bacteria is present, if any changes need to be made to your medications you will be notified  Begin use of ciprofloxacin twice daily for 5 days  You may use over-the-counter Azo to help minimize your symptoms until antibiotic removes bacteria, this medication will turn your urine orange  Increase your fluid intake through use of water  As always practice good hygiene, wiping front to back and avoidance of scented vaginal products to prevent further irritation  If symptoms continue to persist after use of medication or recur please follow-up with urgent care or your primary doctor as needed

## 2023-12-11 DIAGNOSIS — E0590 Thyrotoxicosis, unspecified without thyrotoxic crisis or storm: Secondary | ICD-10-CM | POA: Diagnosis not present

## 2023-12-11 DIAGNOSIS — M810 Age-related osteoporosis without current pathological fracture: Secondary | ICD-10-CM | POA: Diagnosis not present

## 2023-12-11 DIAGNOSIS — E559 Vitamin D deficiency, unspecified: Secondary | ICD-10-CM | POA: Diagnosis not present

## 2023-12-11 LAB — URINE CULTURE: Culture: <10000 — AB

## 2023-12-11 NOTE — Progress Notes (Addendum)
 History of present illness April Best is seen today for follow-up.  She is a 73 y.o. female who has no prior known history of adrenal disease.  She developed some back pain after Christmas in 2020 and went to [facility]. They originally did an x-ray and followed that with an MRI.  The MRI showed thickening of her left adrenal gland, so I saw her in 3/21 to evaluate.  Evaluation returned negative.  Her orthopedist later did a bone density which showed osteoporosis, so I saw her again in 4/21 and started her on reclast  therapy.  She had her first infusion in 5/21, second in 7/22 and third infusion in 8/23.  I last saw her in 6/24.  She is having shoulder replacement.  She had her fourth infusion on 02/26/23.   She is on infusions for psoriatic arthritis.   She is currently on reclast  therapy as above.  She did try fosamax in the past when she had osteopenia, but could not tolerate it from a GERD standpoint.  She takes Viactiv.  She does consume dairy.  She takes 2000 units of vitamin D daily.  She has not had any recent fractures though she has a history of sacral insuffiencey fractures.  She is concerned about her bone health.     ROS:  No chest pain.  No shortness of breath.   Medical History: Past Medical History:  Diagnosis Date  . Anxiety   . COPD (chronic obstructive pulmonary disease) (CMS/HHS-HCC)   . Depression   . Fibromyalgia 04/24/2021  . Hemochromatosis   . History of stroke   . Hyperlipidemia   . Hypertension   . Insomnia   . Lichen planus   . Osteoporosis   . Peptic ulcer   . Proteinuria    chronic  . Psoriasis   . Psoriatic arthritis (CMS/HHS-HCC)   . RA (rheumatoid arthritis) (CMS/HHS-HCC)   . Simple renal cyst    Surgical History: Past Surgical History:  Procedure Laterality Date  . OOPHORECTOMY  1983  . COLONOSCOPY  03/27/2006   Dr. CHARM Punch @ [facility] - Nml  . EGD  11/05/2006   Dr. CHARM Punch @ [facility]   . COLONOSCOPY  11/19/2017   Hyperplastic colon  polyp/Repeat 72yrs/TKT  . EGD  03/19/2022   EGD biopsies were unremarkable. Repeat EGD prn/CTL   Social History:  reports that she has been smoking cigarettes. She has a 20 pack-year smoking history. She has been exposed to tobacco smoke. She has never used smokeless tobacco. She reports that she does not currently use alcohol. She reports that she does not use drugs.  She is retired.  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  She is married.  Family History: family history includes Cancer in her father; Coronary Artery Disease (Blocked arteries around heart) in her father; Diabetes in her mother; Fibromyalgia in her mother and sister; Heart disease in her father and mother; Hemochromatosis in her sister; High blood pressure (Hypertension) in her father, mother, and sister; Hyperlipidemia (Elevated cholesterol) in her father, mother, and sister; Hypothyroidism in her mother; Kidney disease in her mother; Myocardial Infarction (Heart attack) in her father and mother; Sleep disorder in her mother.  Medications: Current Outpatient Medications  Medication Sig Dispense Refill  . amLODIPine  (NORVASC ) 5 MG tablet Take 5 mg by mouth once daily    . azelastine (ASTELIN) 137 mcg nasal spray     . BREZTRI  AEROSPHERE 160-9-4.8 mcg/actuation inhaler Inhale into the lungs Uses this sometimes    .  DULoxetine  (CYMBALTA ) 20 MG DR capsule Take 2 capsules (40 mg total) by mouth once daily 180 capsule 0  . escitalopram  oxalate (LEXAPRO ) 20 MG tablet Take 20 mg by mouth once daily    . fluticasone  propionate (FLONASE ) 50 mcg/actuation nasal spray     . gabapentin  (NEURONTIN ) 100 MG capsule Take 100 mg by mouth 2 (two) times daily as needed       . gabapentin  (NEURONTIN ) 300 MG capsule 1 at night    . loratadine (CLARITIN) 10 mg tablet Take by mouth once daily as needed       . losartan  (COZAAR ) 25 MG tablet Take 25 mg by mouth once daily    . methocarbamoL (ROBAXIN) 750 MG tablet Take 1 tablet 3 times a day by oral route for  30 days.    . ondansetron  (ZOFRAN ) 4 MG tablet Take 1 tablet (4 mg total) by mouth every 8 (eight) hours as needed for Nausea 30 tablet 1  . oxyCODONE -acetaminophen  (PERCOCET) 7.5-325 mg tablet Take 1 tablet by mouth every 6 (six) hours as needed for Pain       . potassium chloride  (KLOR-CON ) 10 MEQ ER tablet     . traZODone  (DESYREL ) 100 MG tablet Take 25 mg by mouth at bedtime Takes quarter of the pill every night    . VITAMIN D3 ORAL Take by mouth once daily With calcium     . aspirin  81 MG chewable tablet Take by mouth (Patient not taking: Reported on 12/11/2023)    . nitroGLYcerin  (NITROSTAT ) 0.4 MG SL tablet Place 0.4 mg under the tongue every 5 (five) minutes as needed (Patient not taking: Reported on 12/11/2023)    . pantoprazole  (PROTONIX ) 40 MG DR tablet take one tablet by mouth once daily (Patient not taking: Reported on 12/11/2023) 90 tablet 0  . predniSONE  (DELTASONE ) 10 MG tablet Take 1 tablet (10 mg total) by mouth once daily Early fill approved (Patient not taking: Reported on 12/11/2023) 90 tablet 2  . rosuvastatin  (CRESTOR ) 5 MG tablet  (Patient not taking: Reported on 12/11/2023)    . Saccharomyces boulardii (FLORASTOR) 250 mg capsule Take 1 capsule (250 mg total) by mouth 2 (two) times daily (Patient not taking: Reported on 12/11/2023) 60 capsule 6   No current facility-administered medications for this visit.   Allergies: Allergies  Allergen Reactions  . Provigil [Modafinil] Rash  . Augmentin [Amoxicillin-Pot Clavulanate] Vomiting  . Cefdinir  Other (See Comments)    Gives her Cdiff   . Enbrel [Etanercept] Hives  . Lisinopril Cough  . Nitrofurantoin  Hives   Physical Exam: Vitals:   12/11/23 1107  BP: 134/80  Pulse: 93  SpO2: 94%  Weight: 50.8 kg (112 lb)  Height: 154.9 cm (5' 1)   Body mass index is 21.16 kg/m. GENERAL: Pleasant, well-appearing female in no distress.   Physical exam otherwise deferred due to coronavirus precautions.   Labs: 07/19/2019: MRI of  the lumbar spine with mild thickening of the medial limb of the left adrenal gland consistent with adrenal hyperplasia or adenomatous change.  Otherwise spondylotic disc bulges and foraminal stenoses seen in addition to renal cysts 09/06/2019: Bone density with T scores in the LS = -2.8, left femoral neck = -2.3, left proximal femur = -1.5.  FRAX scores = 6.9/21.  09/09/2019: K/Cr/Ca = 4.0/0.74/9.5.  LFTs normal.  Vitamin D = 31.5.  PTH = 34.  TSH = 1.13 09/15/2019: Cortisol (0806) after dexamethasone =0.9.  ACTH =5.3 (appropriately suppressed after Dex).  Dexamethasone =281.  Metanephrines negative.  Aldosterone = 8.9, renin = 1.012, ratio = 8.8 (0-30).  03/21/2020: TSH = 0.933.  D = 32.1.   10/26/2020: K/Cr/Ca = 4.0/0.7/9.9.  Vitamin D = 27.4. 04/05/2021:  TSH = 0.937.  FT4 = 1.15  09/10/2021:  TSH = 0.292.   D = 31.1.  B12 = 457 10/08/2021:  TSH = 0.453.  FT4 = 1.5 10/24/2021:  Bone density with T scores in LFN=-2.6 (-2.3 in 3/21 but different machine), LPF=-1.9 (-1.5 in 3/21 but different machine), DR=-2.8.  Frax=11/24.   12/10/2021:  K/Cr/Ca= 3.7/0.9/9.5.  02/04/2023:  K/Cr/Ca= 4.3/0.8/9.6.  UDY=9.234.  FT4=0.91.  Vit D=28.5.  08/21/2023: K/Cr/Ca = 4.9/0.96/9.   12/04/2023:  Bone density with T scores in LFN=-2.6 (-2.6 in 5/23), LPF=-1.9 (-1.9 in 5/23), DR=-1.9 (-2.8 in 5/23).  Frax=12/26.  Osteoporosis with improvement in writs.  Assessment/Plan: 1.  Osteoporosis.  Her orthopedist did a bone density in 3/21 which showed osteoporosis.  She did not tolerate Fosamax from a GI standpoint (heart burn).  We decided on Reclast  and she had her first infusion in 5/21.  She had some aches and pains w/ it, but was ok the next day.  She had her second infusion in 7/22,  her third in 8/23 and her fourth in 9/24.  I will do labs in late August to be done and once I review the results, I will order her next infusion.   Recent BMD in 6/25 showed improvement in the wrist as compared to 5/23 in the hip.  I told her to  continue her vitamin Ca/D supplements.  I will plan a holiday tx next visit.   2.  Left adrenal thickening.  I screened her for functional adrenal tumors in 3/21 and evaluation returned benign.  She had a follow-up MRI and I called and spoke to the radiologist about the results.  He did not feel that there was anything to worry about and that the thickening was more a variant of normal.  She probably does not need any further workup of her adrenals.   3.  Low TSH.  Her TSH was low in 3/23.  It was normal prior in 10/22 and has been nl most recently  n 8/24.  I previously told her she does not need to be on therapy for her thyroid  unless things worsen.  I will recheck her TSH in the future.  4. Tobacco use. She is still smoking. I encouraged her to quit.   5.  Vitamin D deficiency.  D was low last year (8/24) so I had her double her supplement.  Will check again with upcoming labs.  6.  RTC in 12 months.  I will do labs in late August and order her 5th reclast  infusion.  02/12/24:  K/Cr/Ca= 4.4/0.8/11.1.  TSH=0.328.  D=37.    Also had Ca=10.6 on 01/22/24.  Will have her come in for more labs due to the new elevated calcium . --Will order Reclast  when I see repeat labs  02/24/24:  PTH=33.  Ca=9.6.  PTHrP, SPEP/UPEP=negative.  1-25 OH 2 D=71.7 (24.8-81.5).  PTHrP<2.  K/Cr=4.4/0.7.  Reclast  ordered.  03/11/2024: Patient called.  Had Reclast  on 9/23 and says she is hurting really badly.  Says her entire body locked up when she coughed last night..  Told her to take symptomatic medicine (Tylenol /ibuprofen) and drink lots of fluids.  Would be strange for this to happen after 4 previous infusions.  This note is partially prepared by Earla Daria Messier, Scribe, in the presence of and acting  as the scribe of Dr. Debby Breaker , MD.     [facility], MD

## 2023-12-16 ENCOUNTER — Ambulatory Visit: Admitting: Student in an Organized Health Care Education/Training Program

## 2023-12-16 ENCOUNTER — Encounter: Payer: Self-pay | Admitting: Student in an Organized Health Care Education/Training Program

## 2023-12-16 VITALS — BP 120/62 | HR 103 | Temp 97.1°F | Ht 59.0 in | Wt 113.0 lb

## 2023-12-16 DIAGNOSIS — J849 Interstitial pulmonary disease, unspecified: Secondary | ICD-10-CM | POA: Diagnosis not present

## 2023-12-16 DIAGNOSIS — F17210 Nicotine dependence, cigarettes, uncomplicated: Secondary | ICD-10-CM

## 2023-12-16 DIAGNOSIS — R911 Solitary pulmonary nodule: Secondary | ICD-10-CM | POA: Diagnosis not present

## 2023-12-16 DIAGNOSIS — M069 Rheumatoid arthritis, unspecified: Secondary | ICD-10-CM

## 2023-12-16 DIAGNOSIS — J449 Chronic obstructive pulmonary disease, unspecified: Secondary | ICD-10-CM

## 2023-12-18 NOTE — Progress Notes (Signed)
 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 She does follow with rheumatology for psoriatic arthritis. Auto-immune workup shows a previously positive ANA (1:1280) in 2021 and anti-CCP (27) in 2021. Myomarker panel was notable for a mildly positive anti-MDA-5 antibody (21 units, <20 is negative). These were repeated recently, and both ANA and anti-MDA-5 antibodies were negative. Anti-CCP was mildly above the threshold of detection at 21, leading me to believe it is likely a false negative. She was on Golimumab  for psoriatic arthritis and is being considered for adalimumab.   The differential diagnosis includes CTD-ILD, with a differential of RA (currently in agreement that positive antibodies were likely false positive) vs mixed connective tissue disease vs MDA-5 associated ILD (negative on repeat). Other differentials include NSIP, DIP, and IPF. 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 Patient is referred for a second opinion in our ILD clinic, and has an upcoming appointment with April Best. Given she has minimal respiratory symptoms, and PFT's showing a lower limit of normal DLCO (84% predicted), I will hold off on further intervention until after her visit.  -referral to ILD clinic  #Pulmonary Nodule  Patient with a RLL pulmonary nodule that showed mild FDG avidity on PET/CT. Given concern for malignancy, patient underwent robotic assisted navigational bronchoscopy to the RLL nodule with my colleague Dr. Malka with biopsy returning negative for malignancy.   The differential for the nodule includes malignancy as well as benign/inflammatory causes (infectious, rheumatoid nodule, confluence of inflammation secondary to ILD). I have also reviewed the images and note a blood vessel leading to the pulmonary  nodule. This was further evaluated by CTA and no AVM was noted in the nodule. Furthermore, the nodule was noted to be stable on repeat CT. I have discussed our options with April Best during our last visit and offered repeat biopsy vs radiographic surveillance. Through shared decision making, she elected for short term radiographic surveillance with interval 6 month repeat CT.   -repeat CT chest at 6 months mark (04/2024)  #COPD  #PRiSM  (preserved ratio impaired spirometry)  PFT's show mild obstruction with normal FEV1/FVC and bronchodilator response. 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 We have initiated her on LABA/LAMA therapy and will continue with attention to this on follow up. She continues on albuterol  as needed.  -continue LABA/LAMA therapy -continue PRN SABA  #Preoperative risk assessment  Patient is low risk for post operative pulmonary complications on Charlanne post operative respiratory failure risk (0.3% risk of mechanical ventilation > 48 hours after surgery) and ARISCAT score (11 points, low risk; 1.6% risk of in-hospital post-op pulmonary complications). She is overall optimized and low risk to proceed with surgery.  I spent 30 minutes caring for this patient today, including preparing to see the patient, obtaining a medical history , reviewing a separately obtained history, performing a medically appropriate examination and/or evaluation, counseling and educating the patient/family/caregiver, documenting clinical information in the electronic health record, and independently interpreting results (not separately reported/billed) and communicating results to the patient/family/caregiver  April November, MD [facility]   End of visit medications:  No orders of the defined types were placed in this encounter.    Current Outpatient Medications:    albuterol  (VENTOLIN  HFA) 108 (90 Base) MCG/ACT inhaler, Inhale 2  puffs into  the lungs every 6 (six) hours as needed for wheezing or shortness of breath., Disp: 8 g, Rfl: 2   amLODipine  (NORVASC ) 5 MG tablet, TAKE 1 TABLET BY MOUTH DAILY, Disp: 90 tablet, Rfl: 1   azelastine (ASTELIN) 0.1 % nasal spray, Place 1 spray into both nostrils daily as needed., Disp: , Rfl:    Bempedoic Acid-Ezetimibe  (NEXLIZET ) 180-10 MG TABS, Take 1 tablet by mouth daily., Disp: 30 tablet, Rfl: 3   clobetasol  ointment (TEMOVATE ) 0.05 %, APP TOPICALLY TO GLUTEAL AREA ONCE D PRF DERMATITIS, Disp: 30 g, Rfl: 1   DULoxetine  (CYMBALTA ) 20 MG capsule, Cymbalta  20 mg capsule,delayed release  Take 1 capsule twice a day by oral route., Disp: , Rfl:    escitalopram  (LEXAPRO ) 20 MG tablet, TAKE ONE TABLET BY MOUTH EVERY DAY, Disp: 90 tablet, Rfl: 1   fluticasone  (FLONASE ) 50 MCG/ACT nasal spray, Place 2 sprays into both nostrils as needed for allergies or rhinitis., Disp: , Rfl:    gabapentin  (NEURONTIN ) 100 MG capsule, Take 100 mg by mouth 2 (two) times daily as needed., Disp: , Rfl:    gabapentin  (NEURONTIN ) 300 MG capsule, Take 300 mg by mouth as needed (qhs). PRN, Disp: , Rfl:    loratadine (CLARITIN) 10 MG tablet, Take 10 mg by mouth daily., Disp: , Rfl:    losartan  (COZAAR ) 25 MG tablet, TAKE 1 TABLET BY MOUTH DAILY, Disp: 90 tablet, Rfl: 1   methocarbamol (ROBAXIN) 750 MG tablet, Take 750 mg by mouth every 8 (eight) hours as needed for muscle spasms., Disp: , Rfl:    nitroGLYCERIN  (NITROSTAT ) 0.4 MG SL tablet, Place 1 tablet (0.4 mg total) under the tongue every 5 (five) minutes as needed for chest pain., Disp: 30 tablet, Rfl: 12   ondansetron  (ZOFRAN -ODT) 4 MG disintegrating tablet, Take 1 tablet (4 mg total) by mouth every 8 (eight) hours as needed for nausea or vomiting., Disp: 20 tablet, Rfl: 0   oxyCODONE -acetaminophen  (PERCOCET) 10-325 MG tablet, Take 1 tablet by mouth every 4 (four) hours as needed for pain., Disp: , Rfl:    potassium chloride  (KLOR-CON  M) 10 MEQ tablet, TAKE TWO  TABLETS BY MOUTH EVERY DAY, Disp: 60 tablet, Rfl: 1   predniSONE  (DELTASONE ) 10 MG tablet, Take 10 mg by mouth daily., Disp: , Rfl:    Tiotropium Bromide-Olodaterol (STIOLTO RESPIMAT ) 2.5-2.5 MCG/ACT AERS, Inhale 2 puffs into the lungs daily., Disp: 60 each, Rfl: 12   traZODone  (DESYREL ) 100 MG tablet, TAKE 0.5-1 TABLET BY MOUTH AT BEDTIME. (Patient taking differently: Take 100 mg by mouth at bedtime as needed for sleep.), Disp: 30 tablet, Rfl: 5   triamcinolone  cream (KENALOG ) 0.1 %, Apply topically 2 (two) times daily as needed., Disp: , Rfl:    azithromycin  (ZITHROMAX ) 250 MG tablet, Take 1 tablet (250 mg total) by mouth daily. Take first 2 tablets together, then 1 every day until finished. (Patient not taking: Reported on 12/16/2023), Disp: 6 tablet, Rfl: 0   diclofenac Sodium (VOLTAREN) 1 % GEL, Apply 2 g topically 4 (four) times daily. (Patient not taking: Reported on 12/16/2023), Disp: , Rfl:    gentamicin ointment (GARAMYCIN) 0.1 %, Apply 1 application  topically 3 (three) times daily. (Patient not taking: Reported on 12/16/2023), Disp: , Rfl:    Golimumab  (SIMPONI  ARIA IV), Inject into the vein every 8 (eight) weeks. Infusion for RA. (Patient not taking: Reported on 12/16/2023), Disp: , Rfl:  No current facility-administered medications for this visit.  Facility-Administered Medications Ordered in Other Visits:  albuterol  (PROVENTIL ) (2.5 MG/3ML) 0.083% nebulizer solution 2.5 mg, 2.5 mg, Nebulization, Once, Isadora Hose, MD   Subjective:   PATIENT ID: April Best GENDER: female DOB: [birth year], MRN: [id number]  Chief Complaint  Patient presents with   Follow-up    No SOB, wheezing or cough.    HPI  April Best is a pleasant 73 year old female with a past medical history of psoriatic arthritis followed by Dr. Tobie from rheumatology presenting for follow up. The purpose of today's visit is pre-operative risk assessment prior to planned orthopedic surgery.   OV 10/28/23: Since our  last visit she continues to experience cough and shortness of breath with exertion.  She does feel that the cough is somewhat better.  Her shortness of breath is alleviated with the use of albuterol . She did have an episode in March with increased shortness of breath and cough prompting a visit to urgent care where she was treated with steroids and antibiotics for an exacerbation of COPD.  OV 12/16/23: Patient reports stable respiratory symptoms since our last visit. She continues to experience a mild cough and exertional dyspnea. This is unchanged from prior. She does report generalized joint pains, which has previously been responsive to prednisone  therapy. The use of albuterol  helps with her dyspnea. She has continued to follow with Dr. Tobie from [facility] and was planned to start Humera which has not yet been initiated.    Patient has a history of Psoriatic arthritis followed by Dr. Tobie from [facility]. She was maintained on Simponi  Aria (Golimumab ), previously tolerating it without flares, but no longer does with worsening joint pains. She is being considered for humira therapy. She also has a history of CAD followed by cardiology   Patient has a long standing history of smoking, and currently continues to smoke. Given this, she was enrolled in our lung cancer screening program. A LDCT was performed on 04/29/2023 showing findings concerning for interstitial lung disease as well as a right lower lobe nodule.   She currently smokes 1 pack a day. She has around 55 pack years of smoking history. Denies occupational exposures.  Ancillary information including prior medications, full medical/surgical/family/social histories, and PFTs (when available) are listed below and have been reviewed.   Review of Systems  Constitutional:  Negative for chills, fever and weight loss.  Respiratory:  Negative for cough, hemoptysis, sputum production, shortness of breath and wheezing.   Cardiovascular:   Negative for chest pain and palpitations.     Objective:   Vitals:   12/16/23 1314  BP: 120/62  Pulse: (!) 103  Temp: (!) 97.1 F (36.2 C)  SpO2: 94%  Weight: 113 lb (51.3 kg)  Height: 4' 11 (1.499 m)   94% on RA BMI Readings from Last 3 Encounters:  12/16/23 22.82 kg/m  10/28/23 23.19 kg/m  09/05/23 23.47 kg/m   Wt Readings from Last 3 Encounters:  12/16/23 113 lb (51.3 kg)  10/28/23 114 lb 12.8 oz (52.1 kg)  09/05/23 116 lb 3.2 oz (52.7 kg)    Physical Exam Constitutional:      Appearance: Normal appearance.  Cardiovascular:     Rate and Rhythm: Normal rate and regular rhythm.     Pulses: Normal pulses.     Heart sounds: Normal heart sounds.  Pulmonary:     Effort: Pulmonary effort is normal.     Breath sounds: Rales (bibasilar) present.  Neurological:     General: No focal deficit present.     Mental Status:  She is alert and oriented to person, place, and time. Mental status is at baseline.       Ancillary Information    Past Medical History:  Diagnosis Date   Allergic rhinitis    Allergy    Annular oral lichen planus    Anxiety    Arthritis    Depression    Deviated nasal septum    nasal obstruction   Guaiac positive stools    history   H. 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   History of palpitations    Hyperlipidemia    Hypertension    Migraine    Myocardial infarction (HCC)    Osteoarthritis    Osteoporosis    Panic disorder    Psoriasis    Psoriatic arthritis (HCC)    Rheumatoid arteritis (HCC)    Tinnitus      Family History  Problem Relation Age of Onset   Hypertension Mother    Hyperlipidemia Mother    Hypothyroidism Mother    Fibromyalgia Mother    Hearing loss Mother    Lung cancer Father    Coronary artery disease Father    Hyperlipidemia Father    Hypertension Father    Heart attack Father    Hearing loss Sister    Hyperlipidemia Sister    Hypertension Sister     Fibromyalgia Sister    Hemochromatosis Sister    ADD / ADHD Brother    Breast cancer Neg Hx      Past Surgical History:  Procedure Laterality Date   ABDOMINAL HYSTERECTOMY  1983   Ovaries have been removed.   ABDOMINOPLASTY     APPENDECTOMY     BREAST BIOPSY Right    neg- core   BRONCHIAL BRUSHINGS  08/21/2023   Procedure: BRONCHOSCOPY, WITH BRUSH BIOPSY;  Surgeon: Malka Domino, MD;  Location: [facility];  Service: Pulmonary;;   BRONCHIAL NEEDLE ASPIRATION BIOPSY  08/21/2023   Procedure: BRONCHOSCOPY, WITH NEEDLE ASPIRATION BIOPSY;  Surgeon: Malka Domino, MD;  Location: [facility];  Service: Pulmonary;;   BRONCHIAL WASHINGS  08/21/2023   Procedure: IRRIGATION, BRONCHUS;  Surgeon: Malka Domino, MD;  Location: [facility];  Service: Pulmonary;;   COLONOSCOPY  03/2006   COLONOSCOPY WITH PROPOFOL  N/A 11/19/2017   Procedure: COLONOSCOPY WITH PROPOFOL ;  Surgeon: Toledo, Ladell POUR, MD;  Location: [facility] ENDOSCOPY;  Service: Gastroenterology;  Laterality: N/A;   CORONARY/GRAFT ACUTE MI REVASCULARIZATION N/A 02/24/2022   Procedure: Coronary/Graft Acute MI Revascularization;  Surgeon: Darron Deatrice LABOR, MD;  Location: [facility];  Service: Cardiovascular;  Laterality: N/A;   ENDOBRONCHIAL ULTRASOUND Bilateral 08/21/2023   Procedure: ENDOBRONCHIAL ULTRASOUND (EBUS);  Surgeon: Malka Domino, MD;  Location: [facility] ENDOSCOPY;  Service: Pulmonary;  Laterality: Bilateral;   ENDOSCOPIC CONCHA BULLOSA RESECTION Right 01/30/2021   Procedure: ENDOSCOPIC CONCHA BULLOSA RESECTION;  Surgeon: Blair Mt, MD;  Location: [facility];  Service: ENT;  Laterality: Right;   ESOPHAGOGASTRODUODENOSCOPY N/A 03/19/2022   Procedure: ESOPHAGOGASTRODUODENOSCOPY (EGD);  Surgeon: Maryruth Ole DASEN, MD;  Location: [facility]COPY;  Service: Gastroenterology;  Laterality: N/A;   LEFT HEART CATH AND CORONARY ANGIOGRAPHY N/A 02/24/2022   Procedure: LEFT HEART CATH AND CORONARY ANGIOGRAPHY;   Surgeon: Darron Deatrice LABOR, MD;  Location: [facility] INV[facility] CV LAB;  Service: Cardiovascular;  Laterality: N/A;   RHINOPLASTY  2002   SEPTOPLASTY N/A 01/30/2021   Procedure: SEPTOPLASTY;  Surgeon: Blair Mt, MD;  Location: [facility];  Service: ENT;  Laterality: N/A;  UPPER GI ENDOSCOPY  2008    Social History   Socioeconomic History   Marital status: Married    Spouse name: Not on file   Number of children: 1   Years of education: Not on file   Highest education level: High school graduate  Occupational History   Occupation: retired  Tobacco Use   Smoking status: Every Day    Current packs/day: 1.00    Average packs/day: 1 pack/day for 54.5 years (54.5 ttl pk-yrs)    Types: Cigarettes    Start date: 19   Smokeless tobacco: Never   Tobacco comments:    Smokes 1 PPD- khj 12/16/2023  Vaping Use   Vaping status: Never Used  Substance and Sexual Activity   Alcohol use: Not Currently    Alcohol/week: 0.0 standard drinks of alcohol   Drug use: No   Sexual activity: Yes    Birth control/protection: Surgical  Other Topics Concern   Not on file  Social History Narrative   Not on file   Social Drivers of Health   Financial Resource Strain: Low Risk  (10/08/2023)   Overall Financial Resource Strain (CARDIA)    Difficulty of Paying Living Expenses: Not hard at all  Food Insecurity: No Food Insecurity (10/08/2023)   Hunger Vital Sign    Worried About Running Out of Food in the Last Year: Never true    Ran Out of Food in the Last Year: Never true  Transportation Needs: No Transportation Needs (10/08/2023)   PRAPARE - Administrator, Civil Service (Medical): No    Lack of Transportation (Non-Medical): No  Physical Activity: Inactive (10/08/2023)   Exercise Vital Sign    Days of Exercise per Week: 0 days    Minutes of Exercise per Session: 0 min  Stress: No Stress Concern Present (10/08/2023)   [employer] of Occupational Health - Occupational Stress  Questionnaire    Feeling of Stress : Only a little  Social Connections: Moderately Integrated (10/08/2023)   Social Connection and Isolation Panel    Frequency of Communication with Friends and Family: More than three times a week    Frequency of Social Gatherings with Friends and Family: Never    Attends Religious Services: More than 4 times per year    Active Member of [employer] or Organizations: No    Attends Banker Meetings: Never    Marital Status: Married  Catering manager Violence: Not At Risk (10/08/2023)   Humiliation, Afraid, Rape, and Kick questionnaire    Fear of Current or Ex-Partner: No    Emotionally Abused: No    Physically Abused: No    Sexually Abused: No     Allergies  Allergen Reactions   Enbrel [Etanercept] Hives    Other reaction(s): Muscle Cramps   Modafinil Rash   Augmentin  [Amoxicillin-Pot Clavulanate] Nausea And Vomiting    as stated (XR).   Cefdinir  Diarrhea and Other (See Comments)    Other reaction(s): GI intolerance, Other (see comments) Gives her Cdiff Gives her Cdiff    Lisinopril Cough   Macrobid  [Nitrofurantoin ] Hives     CBC    Component Value Date/Time   WBC 13.7 (H) 07/30/2023 1029   WBC 11.0 (H) 07/31/2022 1112   RBC 4.73 07/30/2023 1029   HGB 15.2 (H) 07/30/2023 1029   HGB 13.9 04/23/2021 0917   HCT 47.4 (H) 07/30/2023 1029   HCT 42.3 04/23/2021 0917   PLT 354 07/30/2023 1029   PLT 599 (H) 04/23/2021 9082  MCV 100.2 (H) 07/30/2023 1029   MCV 96 04/23/2021 0917   MCV 98 10/18/2013 1529   MCH 32.1 07/30/2023 1029   MCHC 32.1 07/30/2023 1029   RDW 13.1 07/30/2023 1029   RDW 11.3 (L) 04/23/2021 0917   RDW 12.3 10/18/2013 1529   LYMPHSABS 2.2 07/30/2023 1029   LYMPHSABS 3.2 (H) 04/23/2021 0917   LYMPHSABS 2.8 10/18/2013 1529   MONOABS 0.7 07/30/2023 1029   MONOABS 0.7 10/18/2013 1529   EOSABS 0.0 07/30/2023 1029   EOSABS 0.7 (H) 04/23/2021 0917   EOSABS 0.2 10/18/2013 1529   BASOSABS 0.1 07/30/2023 1029    BASOSABS 0.0 04/23/2021 0917   BASOSABS 0.2 (H) 10/18/2013 1529    Pulmonary Functions Testing Results:    Latest Ref Rng & Units 08/26/2023    4:16 PM  PFT Results  FVC-Pre L 1.60   FVC-Predicted Pre % 67   FVC-Post L 1.76   FVC-Predicted Post % 74   Pre FEV1/FVC % % 76   Post FEV1/FCV % % 78   FEV1-Pre L 1.22   FEV1-Predicted Pre % 68   FEV1-Post L 1.37   DLCO uncorrected ml/min/mmHg 13.68   DLCO UNC% % 84   DLVA Predicted % 91   TLC L 4.40   TLC % Predicted % 102   RV % Predicted % 131     Outpatient Medications Prior to Visit  Medication Sig Dispense Refill   albuterol  (VENTOLIN  HFA) 108 (90 Base) MCG/ACT inhaler Inhale 2 puffs into the lungs every 6 (six) hours as needed for wheezing or shortness of breath. 8 g 2   amLODipine  (NORVASC ) 5 MG tablet TAKE 1 TABLET BY MOUTH DAILY 90 tablet 1   azelastine (ASTELIN) 0.1 % nasal spray Place 1 spray into both nostrils daily as needed.     Bempedoic Acid-Ezetimibe  (NEXLIZET ) 180-10 MG TABS Take 1 tablet by mouth daily. 30 tablet 3   clobetasol  ointment (TEMOVATE ) 0.05 % APP TOPICALLY TO GLUTEAL AREA ONCE D PRF DERMATITIS 30 g 1   DULoxetine  (CYMBALTA ) 20 MG capsule Cymbalta  20 mg capsule,delayed release  Take 1 capsule twice a day by oral route.     escitalopram  (LEXAPRO ) 20 MG tablet TAKE ONE TABLET BY MOUTH EVERY DAY 90 tablet 1   fluticasone  (FLONASE ) 50 MCG/ACT nasal spray Place 2 sprays into both nostrils as needed for allergies or rhinitis.     gabapentin  (NEURONTIN ) 100 MG capsule Take 100 mg by mouth 2 (two) times daily as needed.     gabapentin  (NEURONTIN ) 300 MG capsule Take 300 mg by mouth as needed (qhs). PRN     loratadine (CLARITIN) 10 MG tablet Take 10 mg by mouth daily.     losartan  (COZAAR ) 25 MG tablet TAKE 1 TABLET BY MOUTH DAILY 90 tablet 1   methocarbamol (ROBAXIN) 750 MG tablet Take 750 mg by mouth every 8 (eight) hours as needed for muscle spasms.     nitroGLYCERIN  (NITROSTAT ) 0.4 MG SL tablet Place 1  tablet (0.4 mg total) under the tongue every 5 (five) minutes as needed for chest pain. 30 tablet 12   ondansetron  (ZOFRAN -ODT) 4 MG disintegrating tablet Take 1 tablet (4 mg total) by mouth every 8 (eight) hours as needed for nausea or vomiting. 20 tablet 0   oxyCODONE -acetaminophen  (PERCOCET) 10-325 MG tablet Take 1 tablet by mouth every 4 (four) hours as needed for pain.     potassium chloride  (KLOR-CON  M) 10 MEQ tablet TAKE TWO TABLETS BY MOUTH EVERY DAY 60 tablet  1   predniSONE  (DELTASONE ) 10 MG tablet Take 10 mg by mouth daily.     Tiotropium Bromide-Olodaterol (STIOLTO RESPIMAT ) 2.5-2.5 MCG/ACT AERS Inhale 2 puffs into the lungs daily. 60 each 12   traZODone  (DESYREL ) 100 MG tablet TAKE 0.5-1 TABLET BY MOUTH AT BEDTIME. (Patient taking differently: Take 100 mg by mouth at bedtime as needed for sleep.) 30 tablet 5   triamcinolone  cream (KENALOG ) 0.1 % Apply topically 2 (two) times daily as needed.     azithromycin  (ZITHROMAX ) 250 MG tablet Take 1 tablet (250 mg total) by mouth daily. Take first 2 tablets together, then 1 every day until finished. (Patient not taking: Reported on 12/16/2023) 6 tablet 0   diclofenac Sodium (VOLTAREN) 1 % GEL Apply 2 g topically 4 (four) times daily. (Patient not taking: Reported on 12/16/2023)     gentamicin ointment (GARAMYCIN) 0.1 % Apply 1 application  topically 3 (three) times daily. (Patient not taking: Reported on 12/16/2023)     Golimumab  (SIMPONI  ARIA IV) Inject into the vein every 8 (eight) weeks. Infusion for RA. (Patient not taking: Reported on 12/16/2023)     Facility-Administered Medications Prior to Visit  Medication Dose Route Frequency Provider Last Rate Last Admin   albuterol  (PROVENTIL ) (2.5 MG/3ML) 0.083% nebulizer solution 2.5 mg  2.5 mg Nebulization Once Lyle Leisner, MD

## 2023-12-23 DIAGNOSIS — M19011 Primary osteoarthritis, right shoulder: Secondary | ICD-10-CM | POA: Diagnosis not present

## 2023-12-23 DIAGNOSIS — Z01812 Encounter for preprocedural laboratory examination: Secondary | ICD-10-CM | POA: Diagnosis not present

## 2023-12-23 DIAGNOSIS — Z01818 Encounter for other preprocedural examination: Secondary | ICD-10-CM | POA: Diagnosis not present

## 2023-12-25 DIAGNOSIS — G8918 Other acute postprocedural pain: Secondary | ICD-10-CM | POA: Diagnosis not present

## 2023-12-25 DIAGNOSIS — Z8673 Personal history of transient ischemic attack (TIA), and cerebral infarction without residual deficits: Secondary | ICD-10-CM | POA: Diagnosis not present

## 2023-12-25 DIAGNOSIS — F418 Other specified anxiety disorders: Secondary | ICD-10-CM | POA: Diagnosis not present

## 2023-12-25 DIAGNOSIS — M19011 Primary osteoarthritis, right shoulder: Secondary | ICD-10-CM | POA: Diagnosis not present

## 2023-12-25 DIAGNOSIS — G8929 Other chronic pain: Secondary | ICD-10-CM | POA: Diagnosis not present

## 2023-12-25 DIAGNOSIS — M12811 Other specific arthropathies, not elsewhere classified, right shoulder: Secondary | ICD-10-CM | POA: Diagnosis not present

## 2023-12-25 DIAGNOSIS — F17200 Nicotine dependence, unspecified, uncomplicated: Secondary | ICD-10-CM | POA: Diagnosis not present

## 2023-12-25 DIAGNOSIS — Z7982 Long term (current) use of aspirin: Secondary | ICD-10-CM | POA: Diagnosis not present

## 2023-12-25 DIAGNOSIS — I10 Essential (primary) hypertension: Secondary | ICD-10-CM | POA: Diagnosis not present

## 2023-12-25 HISTORY — PX: REVERSE SHOULDER ARTHROPLASTY: SHX5054

## 2024-01-01 ENCOUNTER — Other Ambulatory Visit: Payer: Self-pay | Admitting: Family Medicine

## 2024-01-01 DIAGNOSIS — F410 Panic disorder [episodic paroxysmal anxiety] without agoraphobia: Secondary | ICD-10-CM

## 2024-01-01 DIAGNOSIS — G8929 Other chronic pain: Secondary | ICD-10-CM | POA: Diagnosis not present

## 2024-01-01 DIAGNOSIS — M25511 Pain in right shoulder: Secondary | ICD-10-CM | POA: Diagnosis not present

## 2024-01-05 ENCOUNTER — Ambulatory Visit: Admitting: Internal Medicine

## 2024-01-05 ENCOUNTER — Encounter: Payer: Self-pay | Admitting: Internal Medicine

## 2024-01-05 VITALS — BP 114/62 | HR 85 | Ht 59.0 in | Wt 114.0 lb

## 2024-01-05 DIAGNOSIS — Z872 Personal history of diseases of the skin and subcutaneous tissue: Secondary | ICD-10-CM

## 2024-01-05 DIAGNOSIS — R911 Solitary pulmonary nodule: Secondary | ICD-10-CM

## 2024-01-05 DIAGNOSIS — J849 Interstitial pulmonary disease, unspecified: Secondary | ICD-10-CM | POA: Diagnosis not present

## 2024-01-05 DIAGNOSIS — Z87891 Personal history of nicotine dependence: Secondary | ICD-10-CM

## 2024-01-05 DIAGNOSIS — F17210 Nicotine dependence, cigarettes, uncomplicated: Secondary | ICD-10-CM | POA: Diagnosis not present

## 2024-01-05 DIAGNOSIS — R768 Other specified abnormal immunological findings in serum: Secondary | ICD-10-CM | POA: Diagnosis not present

## 2024-01-05 NOTE — Progress Notes (Signed)
 OV 01/05/2024  Subjective:  Patient ID: April Best, female , DOB: [birth year] , age 73 y.o. , MRN: [id number] , ADDRESS: [address] [facility] [id number] PCP Myrla Jon HERO, MD Patient Care Team: Myrla Jon HERO, MD as PCP - General (Family Medicine) Darliss Rogue, MD as PCP - Cardiology (Cardiology) Rennie Cindy SAUNDERS, MD as Consulting Physician (Internal Medicine) Cherilyn Debby CROME, MD as Consulting Physician (Internal Medicine) Severa Milo, NP as Nurse Practitioner (Pain Medicine) Pa, [facility] (Optometry) Tobie Lady POUR, MD as Consulting Physician (Rheumatology) Maree Jannett POUR, MD as Consulting Physician (Neurology) Twylla Glendia BROCKS, MD (Urology) Marchia Drivers, MD as Referring Physician (Orthopedic Surgery) Levi Charleen CROME, MD as Referring Physician (Pain Medicine) Jordan, Jimmy J, MD as Referring Physician (Physical Medicine and Rehabilitation) Blair Mt, MD as Referring Physician (Otolaryngology) Isadora Hose, MD as Consulting Physician (Pulmonary Disease)  This Provider for this visit: Treatment Team:  Attending Provider: Geronimo Amel, MD    01/05/2024 -   Chief Complaint  Patient presents with   Follow-up    ILD- Former Dr. Isadora pt.  Breathing is stable. She just had shoulder replacement surgery 12/25/23 and did well with this. She continues to smoke 1 ppd.      HPI April Best 73 y.o. -presents with her husband.  Referred to the ILD center by DR Dygali for second opinion of her ILD.  She is a current active smoker.  History is obtained by talking to her and also review of the external medical records.  She has a longstanding history of psoriatic arthritis followed by Dr. HERO. Tobie of [facility] rheumatology.  As of December 2024 she was maintained on Simponi  and some prednisone .  She has a history of being on methotrexate and etanercept in the past.  She has got chronic arthralgia and  arthritis.  End of 2024 she was enrolled in the low-dose CT scan chest program for lung cancer screening because of 55 pack ongoing smoking history.  This was discovered to have right lower lobe lung nodule.  [Differential diagnose include opportunistic infection, inflammatory changes due to ILD, autoimmune nodule itself and lung cancer].  CT scan also showed ILD.  In the past she had a positive ANA 1: 1280 and anti-CCP positive.  March 2020 patient underwent navigational bronchoscopy this ended up being nondiagnostic for the right lower lobe lung nodule.  Shared decision making to keep an eye on the lung nodule with repeat CT scan of the chest in November 2025.  In terms of her ILD she had repeat serology profile and this CCP was trace positive but ANA negative.  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  After discussion of the case conference she has been referred here.  She says from a respiratory standpoint she has very minimal symptoms [see below].  She is more bothered about chronic body aches and different pain.  She feels her primary rheumatologist feels antibodies are trace positive and is not associating the pain with autoimmune disease.  She does not feel that she is being well supported in her care alignment from a rheumatologic standpoint.  Nevertheless it appears that Humira is under active consideration right now for potential psoriatic arthritis although at the same time she says the psoriasis only flares up periodically.  She says the rheumatologist definitely told her she does not have any other autoimmune disease and it is mostly fibromyalgia.  She denies any Raynaud's.  She does admit to easy bruising.  In terms of her spirometry and DLCO she has normal normal DLCO.  She has normal a few 100/FVC ratio.  On a CT scan the presence of ILD is extremely mild.  There is a right lower lobe nodule.  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  The  burden of ILD is extremely mild.  SYMPTOM SCALE - ILD 01/05/2024  Current weight   O2 use ra  Shortness of Breath 0 -> 5 scale with 5 being worst (score 6 If unable to do)  At rest 0  Simple tasks - showers, clothes change, eating, shaving 0  Household (dishes, doing bed, laundry) 0  Shopping 0  Walking level at own pace 2  Walking up Stairs 3  Total (30-36) Dyspnea Score 5  How bad is your cough? 2  How bad is your fatigue 5  How bad is appetiee 2  How bad is nausea 2  How bad is vomiting?  0  How bad is diarrhea?   How bad is anxiety? 2  How bad is depression 2  Any chronic pain - if so where and how bad Pain all over.      CT Chest data from date:  - personally visualized and independently interpreted : as belo - my findings are: as below  IMPRESSION: 1. Enlarging right lower lobe nodule is highly worrisome for primary bronchogenic carcinoma. These results will be called to the ordering clinician or representative by the Radiologist Assistant, and communication documented in the PACS or [employer]. 2. Mild basilar predominant subpleural reticulation, ground-glass and bronchiolectasis, findings which may be due to usual interstitial pneumonitis or fibrotic nonspecific interstitial pneumonitis. Fi    IMPRESSION: 1. Enlarging right lower lobe nodule is highly worrisome for primary bronchogenic carcinoma. These results will be called to the ordering clinician or representative by the Radiologist Assistant, and communication documented in the PACS or [employer]. 2. Mild basilar predominant subpleural reticulation, ground-glass and bronchiolectasis, findings which may be due to usual interstitial pneumonitis or fibrotic nonspecific interstitial pneumonitis. Findings are categorized as probable UIP per consensus guidelines: Diagnosis of Idiopathic Pulmonary Fibrosis: An Official ATS/ERS/JRS/ALAT Clinical Practice Guideline. Am JINNY Honey Crit Care Med Vol  198, Iss 5, [id number], Feb 15 2017. 3. Aortic atherosclerosis (ICD10-I70.0). Coronary artery calcification. 4. Enlarged pulmonic trunk, indicative of pulmonary arterial hypertension. 5.  Emphysema (ICD10-J43.9).     Electronically Signed   By: Newell Eke M.D.   On: 07/25/2023 10:09   PFT     Latest Ref Rng & Units 08/26/2023    4:16 PM  PFT Results  FVC-Pre L 1.60   FVC-Predicted Pre % 67   FVC-Post L 1.76   FVC-Predicted Post % 74   Pre FEV1/FVC % % 76   Post FEV1/FCV % % 78   FEV1-Pre L 1.22   FEV1-Predicted Pre % 68   FEV1-Post L 1.37   DLCO uncorrected ml/min/mmHg 13.68   DLCO UNC% % 84   DLVA Predicted % 91   TLC L 4.40   TLC % Predicted % 102   RV % Predicted % 131       Latest Reference Range & Units 09/14/19 11:05 06/03/23 15:50 08/27/23 15:12  CCP Antibodies IgG/IgA 0 - 19 units   21 (H)  ENA SSA (RO) Ab 0.0 - 0.9 AI <0.2    ENA SSB (LA) Ab 0.0 - 0.9 AI <0.2  RA Latex Turbid. <14.0 IU/mL   <10.0  Anti-Nuclear Ab by IFA (RDL) Negative   Negative   Anti-Ro (SS-A) Ab (RDL) <20 Units  <20   Anti-Jo-1 Ab (RDL) <20 Units  <20   Anti-PL-7 Ab (RDL) Negative   Negative   Anti-PL-12 Ab (RDL) Negative   Negative   Anti-EJ Ab (RDL) Negative   Negative   Anti-OJ Ab (RDL) Negative   Negative   Anti-SRP Ab (RDL) Negative   Negative   Anti-Mi-2 Ab (RDL) Negative   Negative   Anti-TIF-1gamma Ab (RDL) <20 Units  <20   Anti-MDA-5 Ab (CADM-140)(RDL) <20 Units  21 (H)   Anti-NXP-2 (P140) Ab (RDL) <20 Units  <20   Anti-SAE1 Ab, IgG (RDL) <20 Units  <20   Anti-PM/Scl-100 Ab (RDL) <20 Units  <20   Anti-Ku Ab (RDL) Negative   Negative   Anti-SS-A 52kD Ab, IgG (RDL) <20 Units  <20   Anti-U1 RNP Ab (RDL) <20 Units  <20   Anti-U2 RNP Ab (RDL) Negative   Negative   Anti-U3 RNP (Fibrillarin)(RDL) Negative   Negative   (H): Data is abnormally high  LAB RESULTS last 96 hours No results found.       has a past medical history of Allergic rhinitis, Allergy, Annular  oral lichen planus, Anxiety, Arthritis, Depression, Deviated nasal septum, Guaiac positive stools, H. pylori infection, H/O degenerative disc disease, Hemochromatosis (12/14/2014), History of palpitations, Hyperlipidemia, Hypertension, Migraine, Myocardial infarction (HCC), Osteoarthritis, Osteoporosis, Panic disorder, Psoriasis, Psoriatic arthritis (HCC), Rheumatoid arteritis (HCC), and Tinnitus.   reports that she has been smoking cigarettes. She started smoking about 54 years ago. She has a 54.6 pack-year smoking history. She has never used smokeless tobacco.  Past Surgical History:  Procedure Laterality Date   ABDOMINAL HYSTERECTOMY  1983   Ovaries have been removed.   ABDOMINOPLASTY     APPENDECTOMY     BREAST BIOPSY Right    neg- core   BRONCHIAL BRUSHINGS  08/21/2023   Procedure: BRONCHOSCOPY, WITH BRUSH BIOPSY;  Surgeon: Malka Domino, MD;  Location: [facility];  Service: Pulmonary;;   BRONCHIAL NEEDLE ASPIRATION BIOPSY  08/21/2023   Procedure: BRONCHOSCOPY, WITH NEEDLE ASPIRATION BIOPSY;  Surgeon: Malka Domino, MD;  Location: [facility];  Service: Pulmonary;;   BRONCHIAL WASHINGS  08/21/2023   Procedure: IRRIGATION, BRONCHUS;  Surgeon: Malka Domino, MD;  Location: [facility];  Service: Pulmonary;;   COLONOSCOPY  03/2006   COLONOSCOPY WITH PROPOFOL  N/A 11/19/2017   Procedure: COLONOSCOPY WITH PROPOFOL ;  Surgeon: Toledo, Ladell POUR, MD;  Location: [facility] ENDOSCOPY;  Service: Gastroenterology;  Laterality: N/A;   CORONARY/GRAFT ACUTE MI REVASCULARIZATION N/A 02/24/2022   Procedure: Coronary/Graft Acute MI Revascularization;  Surgeon: Darron Deatrice LABOR, MD;  Location: [facility] INVASIVE CV LAB;  Service: Cardiovascular;  Laterality: N/A;   ENDOBRONCHIAL ULTRASOUND Bilateral 08/21/2023   Procedure: ENDOBRONCHIAL ULTRASOUND (EBUS);  Surgeon: Malka Domino, MD;  Location: [facility] ENDOSCOPY;  Service: Pulmonary;  Laterality: Bilateral;   ENDOSCOPIC CONCHA BULLOSA RESECTION Right  01/30/2021   Procedure: ENDOSCOPIC CONCHA BULLOSA RESECTION;  Surgeon: Blair Mt, MD;  Location: [facility];  Service: ENT;  Laterality: Right;   ESOPHAGOGASTRODUODENOSCOPY N/A 03/19/2022   Procedure: ESOPHAGOGASTRODUODENOSCOPY (EGD);  Surgeon: Maryruth Ole DASEN, MD;  Location: [facility] ENDOSCOPY;  Service: Gastroenterology;  Laterality: N/A;   LEFT HEART CATH AND CORONARY ANGIOGRAPHY N/A 02/24/2022   Procedure: LEFT HEART CATH AND CORONARY ANGIOGRAPHY;  Surgeon: Darron Deatrice LABOR, MD;  Location: [facility] INVASIVE CV LAB;  Service: Cardiovascular;  Laterality: N/A;  RHINOPLASTY  2002   SEPTOPLASTY N/A 01/30/2021   Procedure: SEPTOPLASTY;  Surgeon: Blair Mt, MD;  Location: [facility];  Service: ENT;  Laterality: N/A;   UPPER GI ENDOSCOPY  2008    Allergies  Allergen Reactions   Enbrel [Etanercept] Hives    Other reaction(s): Muscle Cramps   Modafinil Rash   Augmentin  [Amoxicillin-Pot Clavulanate] Nausea And Vomiting    as stated (XR).   Cefdinir  Diarrhea and Other (See Comments)    Other reaction(s): GI intolerance, Other (see comments) Gives her Cdiff Gives her Cdiff    Lisinopril Cough   Macrobid  [Nitrofurantoin ] Hives    Immunization History  Administered Date(s) Administered   Fluad Quad(high Dose 65+) 04/23/2021, 08/15/2022   Fluad Trivalent(High Dose 65+) 07/30/2023   Influenza Split 02/21/2010, 04/22/2012, 07/15/2016   Influenza, High Dose Seasonal PF 03/05/2017, 07/08/2018   Influenza,inj,Quad PF,6+ Mos 03/23/2015   Moderna Sars-Covid-2 Vaccination 07/23/2019, 08/20/2019   Pneumococcal Conjugate-13 01/15/2017   Pneumococcal Polysaccharide-23 04/22/2012, 07/08/2018   Tdap 02/21/2010   Zoster, Live 11/04/2013    Family History  Problem Relation Age of Onset   Hypertension Mother    Hyperlipidemia Mother    Hypothyroidism Mother    Fibromyalgia Mother    Hearing loss Mother    Lung cancer Father    Coronary artery disease Father     Hyperlipidemia Father    Hypertension Father    Heart attack Father    Hearing loss Sister    Hyperlipidemia Sister    Hypertension Sister    Fibromyalgia Sister    Hemochromatosis Sister    ADD / ADHD Brother    Breast cancer Neg Hx      Current Outpatient Medications:    albuterol  (VENTOLIN  HFA) 108 (90 Base) MCG/ACT inhaler, Inhale 2 puffs into the lungs every 6 (six) hours as needed for wheezing or shortness of breath., Disp: 8 g, Rfl: 2   amLODipine  (NORVASC ) 5 MG tablet, TAKE 1 TABLET BY MOUTH DAILY, Disp: 90 tablet, Rfl: 1   azelastine (ASTELIN) 0.1 % nasal spray, Place 1 spray into both nostrils daily as needed., Disp: , Rfl:    Bempedoic Acid-Ezetimibe  (NEXLIZET ) 180-10 MG TABS, Take 1 tablet by mouth daily., Disp: 30 tablet, Rfl: 3   clobetasol  ointment (TEMOVATE ) 0.05 %, APP TOPICALLY TO GLUTEAL AREA ONCE D PRF DERMATITIS, Disp: 30 g, Rfl: 1   DULoxetine  (CYMBALTA ) 20 MG capsule, Cymbalta  20 mg capsule,delayed release  Take 1 capsule twice a day by oral route., Disp: , Rfl:    escitalopram  (LEXAPRO ) 20 MG tablet, TAKE ONE TABLET BY MOUTH EVERY DAY, Disp: 30 tablet, Rfl: 0   fluticasone  (FLONASE ) 50 MCG/ACT nasal spray, Place 2 sprays into both nostrils as needed for allergies or rhinitis., Disp: , Rfl:    gabapentin  (NEURONTIN ) 100 MG capsule, Take 100 mg by mouth 2 (two) times daily as needed., Disp: , Rfl:    gabapentin  (NEURONTIN ) 300 MG capsule, Take 300 mg by mouth as needed (qhs). PRN, Disp: , Rfl:    loratadine (CLARITIN) 10 MG tablet, Take 10 mg by mouth daily., Disp: , Rfl:    losartan  (COZAAR ) 25 MG tablet, TAKE 1 TABLET BY MOUTH DAILY, Disp: 90 tablet, Rfl: 1   methocarbamol (ROBAXIN) 750 MG tablet, Take 750 mg by mouth every 8 (eight) hours as needed for muscle spasms., Disp: , Rfl:    nitroGLYCERIN  (NITROSTAT ) 0.4 MG SL tablet, Place 1 tablet (0.4 mg total) under the tongue every 5 (five) minutes as  needed for chest pain., Disp: 30 tablet, Rfl: 12   ondansetron   (ZOFRAN -ODT) 4 MG disintegrating tablet, Take 1 tablet (4 mg total) by mouth every 8 (eight) hours as needed for nausea or vomiting., Disp: 20 tablet, Rfl: 0   oxyCODONE -acetaminophen  (PERCOCET) 10-325 MG tablet, Take 1 tablet by mouth every 4 (four) hours as needed for pain., Disp: , Rfl:    potassium chloride  (KLOR-CON  M) 10 MEQ tablet, TAKE TWO TABLETS BY MOUTH EVERY DAY, Disp: 60 tablet, Rfl: 1   Tiotropium Bromide-Olodaterol (STIOLTO RESPIMAT ) 2.5-2.5 MCG/ACT AERS, Inhale 2 puffs into the lungs daily., Disp: 60 each, Rfl: 12   traZODone  (DESYREL ) 100 MG tablet, TAKE 0.5-1 TABLET BY MOUTH AT BEDTIME. (Patient taking differently: Take 100 mg by mouth at bedtime as needed for sleep.), Disp: 30 tablet, Rfl: 5   triamcinolone  cream (KENALOG ) 0.1 %, Apply topically 2 (two) times daily as needed., Disp: , Rfl:    azithromycin  (ZITHROMAX ) 250 MG tablet, Take 1 tablet (250 mg total) by mouth daily. Take first 2 tablets together, then 1 every day until finished. (Patient not taking: Reported on 12/16/2023), Disp: 6 tablet, Rfl: 0   diclofenac Sodium (VOLTAREN) 1 % GEL, Apply 2 g topically 4 (four) times daily. (Patient not taking: Reported on 12/16/2023), Disp: , Rfl:    gentamicin ointment (GARAMYCIN) 0.1 %, Apply 1 application  topically 3 (three) times daily. (Patient not taking: Reported on 12/16/2023), Disp: , Rfl:    Golimumab  (SIMPONI  ARIA IV), Inject into the vein every 8 (eight) weeks. Infusion for RA. (Patient not taking: Reported on 12/16/2023), Disp: , Rfl:    predniSONE  (DELTASONE ) 10 MG tablet, Take 10 mg by mouth daily., Disp: , Rfl:  No current facility-administered medications for this visit.  Facility-Administered Medications Ordered in Other Visits:    albuterol  (PROVENTIL ) (2.5 MG/3ML) 0.083% nebulizer solution 2.5 mg, 2.5 mg, Nebulization, Once, Isadora Hose, MD      Objective:   Vitals:   01/05/24 1022  BP: 114/62  Pulse: 85  SpO2: 94%  Weight: 114 lb (51.7 kg)  Height: 4' 11  (1.499 m)    Estimated body mass index is 23.03 kg/m as calculated from the following:   Height as of this encounter: 4' 11 (1.499 m).   Weight as of this encounter: 114 lb (51.7 kg).  @WEIGHTCHANGE @  [employer]   01/05/24 1022  Weight: 114 lb (51.7 kg)     Physical Exam   General: No distress.  Has right shoulder sling O2 at rest: no Cane present: no Sitting in wheel chair: no Frail: yes Obese: no Neuro: Alert and Oriented x 3. GCS 15. Speech normal Psych: Pleasant Resp:  Barrel Chest - no.  Wheeze - no, Crackles - RLL BASE, No overt respiratory distress CVS: Normal heart sounds. Murmurs - no Ext: Stigmata of Connective Tissue Disease - no HEENT: Normal upper airway. PEERL +. No post nasal drip        Assessment:       ICD-10-CM   1. ILD (interstitial lung disease) (HCC)  J84.9 Ambulatory referral to Rheumatology    Pulmonary function test    2. MDA5 antibody positive  R76.8 Ambulatory referral to Rheumatology    Pulmonary function test    3. Cyclic citrullinated peptide (CCP) antibody positive  R76.8 Ambulatory referral to Rheumatology    Pulmonary function test    4. History of psoriasis  Z87.2 Ambulatory referral to Rheumatology    Pulmonary function test    5. History  of smoking  Z87.891 Ambulatory referral to Rheumatology    Pulmonary function test    6. 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  These treatments with its antifibrotic or immunomodulatory do have side effect potential.  The immunomodulatory treatment that will help both lungs and joint pain would include Rituxan, tocilizumab and Imuran [yes familiarity with Imuran because of husband's on it].  However she needs autoimmune disease to support these treatments.  Have requested a second opinion from [facility].  Did indicate to her that many times when the autoimmune antibodies are trace positive it is hard to make a convincing diagnosis of connective tissue disease although if the level of antibodies itself felt to be significant we could give her a diagnosis of IPAF [interstitial lung disease with autoimmune features but no definite of connective tissue disease] for which immunomodulatory treatment would be first-line.  We could see if these immunomodulatory treatments help pain but the first thing would be to require and get a second rheumatologic opinion.  She is agreeable to this.  Current antifibrotic's to cause weight loss and a lot of GI symptoms.  Given overall frailty she is at risk for the side effects and given overall stability in her ILD for 3 years I would keep this as second line esp if she progresses    Plan:     Patient Instructions     ICD-10-CM   1. ILD (interstitial lung disease) (HCC)  J84.9 Ambulatory referral to Rheumatology    Pulmonary function test    2. MDA5 antibody positive  R76.8 Ambulatory referral to Rheumatology    Pulmonary function test    3. Cyclic citrullinated peptide (CCP) antibody positive  R76.8 Ambulatory referral to Rheumatology    Pulmonary function test    4. History of psoriasis  Z87.2 Ambulatory referral to Rheumatology    Pulmonary function test    5. History of smoking  Z87.891 Ambulatory referral to Rheumatology    Pulmonary function test    6. Nodule of lower lobe of right lung  R91.1 Pulmonary function test       ILD with psotive CCP (Trace) and MDA5 positive History of psoriasis  Interstitial lung disease is very mild.  The reason for this is not known.  In addition it has been stable for 3 years Ideal approach to interstitial lung disease -involves antifibrotic's and in the case of autoimmune issues immunomodulators  - Return to be aggressive if the risk of progression is high or if there is evidence of  progression  - In your situation unclear risk for progression   - Risk of progression is higher if rheumatology opinions is that the antibodies are significant or you have definite connective tissue disease  Plan - Refer to [facility]ology [facility] for second opinion -Continue monitoring approach  - Repeat spirometry and DLCO in 3 months -Immunomodulatory drugs that can benefit joint and lungs at the same time are Imuran, Rituxan and tocilizumab  - Noted you are going to hold off on TNF alpha blockade Humira for the moment for psoriasis - Repeat spirometry and DLCO in 3 months - Consider antifibrotic or immunomodulatory based on course and second opinion with rheumatology  Right lower lobe lung nodule nondiagnostic on bronchoscopy  - Keep an open mind this  could be autoimmune lung nodule such as from rheumatoid if there is a diagnosis of rheumatoid arthritis  Plan  - Monitoring per DR Lavona   Followup 3 months 30 min visit after spiro/dlco   FOLLOWUP Return in about 3 months (around 04/06/2024) for 30 min visit, after Spiro and DLCO, Face to Face Visit, with Dr Geronimo.  ( Level 05 visit E&M 2024: Estb >= 40 min  visit type: on-site physical face to visit  in total care time and counseling or/and coordination of care by this undersigned MD - Dr Dorethia Geronimo. This includes one or more of the following on this same day 01/05/2024: pre-charting, chart review, note writing, documentation discussion of test results, diagnostic or treatment recommendations, prognosis, risks and benefits of management options, instructions, education, compliance or risk-factor reduction. It excludes time spent by the CMA or office staff in the care of the patient. Actual time 45 min)   SIGNATURE    Dr. Dorethia Geronimo, M.D., F.C.C.P,  Pulmonary and Critical Care Medicine Staff Physician, [facility] Center Director - Interstitial Lung Disease  Program  Pulmonary Fibrosis  [facility] [place], [place], [address]  Pager: [phone], If no answer or between  15:00h - 7:00h: call [phone] Telephone: [phone]  3:06 PM 01/05/2024

## 2024-01-05 NOTE — Patient Instructions (Addendum)
 ICD-10-CM   1. ILD (interstitial lung disease) (HCC)  J84.9 Ambulatory referral to Rheumatology    Pulmonary function test    2. MDA5 antibody positive  R76.8 Ambulatory referral to Rheumatology    Pulmonary function test    3. Cyclic citrullinated peptide (CCP) antibody positive  R76.8 Ambulatory referral to Rheumatology    Pulmonary function test    4. History of psoriasis  Z87.2 Ambulatory referral to Rheumatology    Pulmonary function test    5. History of smoking  Z87.891 Ambulatory referral to Rheumatology    Pulmonary function test    6. Nodule of lower lobe of right lung  R91.1 Pulmonary function test       ILD with psotive CCP (Trace) and MDA5 positive History of psoriasis  Interstitial lung disease is very mild.  The reason for this is not known.  In addition it has been stable for 3 years Ideal approach to interstitial lung disease -involves antifibrotic's and in the case of autoimmune issues immunomodulators  - Return to be aggressive if the risk of progression is high or if there is evidence of progression  - In your situation unclear risk for progression   - Risk of progression is higher if rheumatology opinions is that the antibodies are significant or you have definite connective tissue disease  Plan - Refer to [facility] for second opinion -Continue monitoring approach  - Repeat spirometry and DLCO in 3 months -Immunomodulatory drugs that can benefit joint and lungs at the same time are Imuran, Rituxan and tocilizumab  - Noted you are going to hold off on TNF alpha blockade Humira for the moment for psoriasis - Repeat spirometry and DLCO in 3 months - Consider antifibrotic or immunomodulatory based on course and second opinion with rheumatology  Right lower lobe lung nodule nondiagnostic on bronchoscopy  - Keep an open mind this could be autoimmune lung nodule such as from rheumatoid if there is a diagnosis of rheumatoid  arthritis  Plan  - Monitoring per DR Lavona   Followup 3 months 30 min visit after spiro/dlco

## 2024-01-06 DIAGNOSIS — M25611 Stiffness of right shoulder, not elsewhere classified: Secondary | ICD-10-CM | POA: Diagnosis not present

## 2024-01-06 DIAGNOSIS — M25511 Pain in right shoulder: Secondary | ICD-10-CM | POA: Diagnosis not present

## 2024-01-06 DIAGNOSIS — G8929 Other chronic pain: Secondary | ICD-10-CM | POA: Diagnosis not present

## 2024-01-13 DIAGNOSIS — M25611 Stiffness of right shoulder, not elsewhere classified: Secondary | ICD-10-CM | POA: Diagnosis not present

## 2024-01-13 DIAGNOSIS — M25511 Pain in right shoulder: Secondary | ICD-10-CM | POA: Diagnosis not present

## 2024-01-13 DIAGNOSIS — G8929 Other chronic pain: Secondary | ICD-10-CM | POA: Diagnosis not present

## 2024-01-13 NOTE — Telephone Encounter (Signed)
 SABRA

## 2024-01-15 DIAGNOSIS — M25611 Stiffness of right shoulder, not elsewhere classified: Secondary | ICD-10-CM | POA: Diagnosis not present

## 2024-01-15 DIAGNOSIS — G8929 Other chronic pain: Secondary | ICD-10-CM | POA: Diagnosis not present

## 2024-01-15 DIAGNOSIS — M25511 Pain in right shoulder: Secondary | ICD-10-CM | POA: Diagnosis not present

## 2024-01-20 DIAGNOSIS — M25611 Stiffness of right shoulder, not elsewhere classified: Secondary | ICD-10-CM | POA: Diagnosis not present

## 2024-01-20 DIAGNOSIS — G8929 Other chronic pain: Secondary | ICD-10-CM | POA: Diagnosis not present

## 2024-01-20 DIAGNOSIS — M25511 Pain in right shoulder: Secondary | ICD-10-CM | POA: Diagnosis not present

## 2024-01-22 ENCOUNTER — Encounter: Payer: Self-pay | Admitting: Family Medicine

## 2024-01-22 ENCOUNTER — Ambulatory Visit (INDEPENDENT_AMBULATORY_CARE_PROVIDER_SITE_OTHER): Admitting: Family Medicine

## 2024-01-22 VITALS — BP 138/70 | HR 64 | Temp 97.8°F | Ht 59.0 in | Wt 113.9 lb

## 2024-01-22 DIAGNOSIS — F3342 Major depressive disorder, recurrent, in full remission: Secondary | ICD-10-CM | POA: Diagnosis not present

## 2024-01-22 DIAGNOSIS — Z0000 Encounter for general adult medical examination without abnormal findings: Secondary | ICD-10-CM

## 2024-01-22 DIAGNOSIS — M25611 Stiffness of right shoulder, not elsewhere classified: Secondary | ICD-10-CM | POA: Diagnosis not present

## 2024-01-22 DIAGNOSIS — E7800 Pure hypercholesterolemia, unspecified: Secondary | ICD-10-CM

## 2024-01-22 DIAGNOSIS — G8929 Other chronic pain: Secondary | ICD-10-CM | POA: Diagnosis not present

## 2024-01-22 DIAGNOSIS — R7303 Prediabetes: Secondary | ICD-10-CM | POA: Diagnosis not present

## 2024-01-22 DIAGNOSIS — I10 Essential (primary) hypertension: Secondary | ICD-10-CM | POA: Diagnosis not present

## 2024-01-22 DIAGNOSIS — L409 Psoriasis, unspecified: Secondary | ICD-10-CM | POA: Diagnosis not present

## 2024-01-22 DIAGNOSIS — M25511 Pain in right shoulder: Secondary | ICD-10-CM | POA: Diagnosis not present

## 2024-01-22 DIAGNOSIS — Z1231 Encounter for screening mammogram for malignant neoplasm of breast: Secondary | ICD-10-CM | POA: Diagnosis not present

## 2024-01-22 MED ORDER — CLOBETASOL PROPIONATE 0.05 % EX OINT: TOPICAL_OINTMENT | CUTANEOUS | 1 refills | Status: DC

## 2024-01-22 NOTE — Patient Instructions (Signed)
 Call [facility] to schedule a mammogram [phone]   Consider asking at the pharmacy about the tetanus shot (TDAP) and the shingles shot (Shingrix)

## 2024-01-22 NOTE — Progress Notes (Signed)
 Complete physical exam   Patient: April Best   DOB: 10-18-1950   73 y.o. Female  MRN: [id number] Visit Date: 01/22/2024  Today's healthcare provider: Jon Eva, MD   Chief Complaint  Patient presents with   Annual Exam    Patient presents for annual exam Diet: Normal Exercise: doing PT for shoulder but none other than that Sleep: Well Overall Feeling: Good  Concerns: None  Vaccines: Tdap and Shingrix- expressed to visit pharmacy for these  Screenings: DEXA done through [facility] with [facility]     Subjective    April Best is a 73 y.o. female who presents today for a complete physical exam.   Discussed the use of AI scribe software for clinical note transcription with the patient, who gave verbal consent to proceed.  History of Present Illness   April Best is a 73 year old female who presents for her annual physical exam.  She is recovering from reverse shoulder surgery performed on December 25, 2023, and attends rehabilitation sessions twice a week. Post-rehabilitation, her shoulder is sore. She experienced a significant blood pressure spike post-surgery, leading to incision bleeding, which was controlled with compressions.  Her current medications include amlodipine , losartan , albuterol  as needed, Stiolto, Nexelzet, Lexapro , gabapentin , and clobetasol . She experiences occasional chest pain upon waking, resembling heartburn, which is alleviated by eating and taking Tums.  She had a CT scan in May 2025. Her last colonoscopy was before 2029, and her last mammogram was in 2023. She plans to schedule a mammogram after her shoulder recovery. She is due for cholesterol, kidney, liver function tests, and an iron ferritin panel.        Last depression screening scores    01/22/2024    3:56 PM 10/08/2023   11:02 AM 08/15/2022    1:49 PM  PHQ 2/9 Scores  PHQ - 2 Score 0 0 0  PHQ- 9 Score 2 0 0   Last fall risk screening    01/22/2024    3:56 PM  Fall  Risk   Falls in the past year? 0  Number falls in past yr: 0  Injury with Fall? 0  Follow up Falls evaluation completed        Medications: Outpatient Medications Prior to Visit  Medication Sig   albuterol  (VENTOLIN  HFA) 108 (90 Base) MCG/ACT inhaler Inhale 2 puffs into the lungs every 6 (six) hours as needed for wheezing or shortness of breath.   amLODipine  (NORVASC ) 5 MG tablet TAKE 1 TABLET BY MOUTH DAILY   azelastine (ASTELIN) 0.1 % nasal spray Place 1 spray into both nostrils daily as needed.   Bempedoic Acid-Ezetimibe  (NEXLIZET ) 180-10 MG TABS Take 1 tablet by mouth daily.   escitalopram  (LEXAPRO ) 20 MG tablet TAKE ONE TABLET BY MOUTH EVERY DAY   fluticasone  (FLONASE ) 50 MCG/ACT nasal spray Place 2 sprays into both nostrils as needed for allergies or rhinitis.   gabapentin  (NEURONTIN ) 100 MG capsule Take 100 mg by mouth 2 (two) times daily as needed.   gabapentin  (NEURONTIN ) 300 MG capsule Take 300 mg by mouth as needed (qhs). PRN   Golimumab  (SIMPONI  ARIA IV) Inject into the vein every 8 (eight) weeks. Infusion for RA.   loratadine (CLARITIN) 10 MG tablet Take 10 mg by mouth daily.   losartan  (COZAAR ) 25 MG tablet TAKE 1 TABLET BY MOUTH DAILY   methocarbamol (ROBAXIN) 750 MG tablet Take 750 mg by mouth every 8 (eight) hours as needed for muscle spasms.  nitroGLYCERIN  (NITROSTAT ) 0.4 MG SL tablet Place 1 tablet (0.4 mg total) under the tongue every 5 (five) minutes as needed for chest pain.   ondansetron  (ZOFRAN -ODT) 4 MG disintegrating tablet Take 1 tablet (4 mg total) by mouth every 8 (eight) hours as needed for nausea or vomiting.   oxyCODONE -acetaminophen  (PERCOCET) 10-325 MG tablet Take 1 tablet by mouth every 4 (four) hours as needed for pain.   potassium chloride  (KLOR-CON  M) 10 MEQ tablet TAKE TWO TABLETS BY MOUTH EVERY DAY   predniSONE  (DELTASONE ) 10 MG tablet Take 10 mg by mouth daily.   Tiotropium Bromide-Olodaterol (STIOLTO RESPIMAT ) 2.5-2.5 MCG/ACT AERS Inhale 2  puffs into the lungs daily.   traZODone  (DESYREL ) 100 MG tablet TAKE 0.5-1 TABLET BY MOUTH AT BEDTIME. (Patient taking differently: Take 100 mg by mouth at bedtime as needed for sleep.)   [DISCONTINUED] clobetasol  ointment (TEMOVATE ) 0.05 % APP TOPICALLY TO GLUTEAL AREA ONCE D PRF DERMATITIS   [DISCONTINUED] azithromycin  (ZITHROMAX ) 250 MG tablet Take 1 tablet (250 mg total) by mouth daily. Take first 2 tablets together, then 1 every day until finished. (Patient not taking: Reported on 12/16/2023)   [DISCONTINUED] diclofenac Sodium (VOLTAREN) 1 % GEL Apply 2 g topically 4 (four) times daily. (Patient not taking: Reported on 01/22/2024)   [DISCONTINUED] DULoxetine  (CYMBALTA ) 20 MG capsule Cymbalta  20 mg capsule,delayed release  Take 1 capsule twice a day by oral route. (Patient not taking: Reported on 01/22/2024)   [DISCONTINUED] gentamicin ointment (GARAMYCIN) 0.1 % Apply 1 application  topically 3 (three) times daily. (Patient not taking: Reported on 01/22/2024)   [DISCONTINUED] triamcinolone  cream (KENALOG ) 0.1 % Apply topically 2 (two) times daily as needed. (Patient not taking: Reported on 01/22/2024)   Facility-Administered Medications Prior to Visit  Medication Dose Route Frequency Provider   albuterol  (PROVENTIL ) (2.5 MG/3ML) 0.083% nebulizer solution 2.5 mg  2.5 mg Nebulization Once Dgayli, Khabib, MD    Review of Systems    Objective    BP 138/70 (BP Location: Left Arm, Patient Position: Sitting, Cuff Size: Normal)   Pulse 64   Temp 97.8 F (36.6 C) (Oral)   Ht 4' 11 (1.499 m)   Wt 113 lb 14.4 oz (51.7 kg)   BMI 23.01 kg/m    Physical Exam   No results found for any visits on 01/22/24.  Assessment & Plan    Routine Health Maintenance and Physical Exam  Exercise Activities and Dietary recommendations  Goals      DIET - EAT MORE FRUITS AND VEGETABLES     DIET - INCREASE WATER INTAKE     Exercise 150 minutes per week (moderate activity)     Recommend to exercise for 3 days a  week for at least 30 minutes at a time.      Quit smoking / using tobacco        Immunization History  Administered Date(s) Administered   Fluad Quad(high Dose 65+) 04/23/2021, 08/15/2022   Fluad Trivalent(High Dose 65+) 07/30/2023   Fluzone Influenza virus vaccine,trivalent (IIV3), split virus 07/16/2016   Influenza Split 02/21/2010, 04/22/2012, 07/15/2016   Influenza, High Dose Seasonal PF 03/05/2017, 07/08/2018   Influenza,inj,Quad PF,6+ Mos 03/23/2015   Moderna Sars-Covid-2 Vaccination 07/23/2019, 07/30/2019, 08/20/2019, 08/27/2019   Pneumococcal Conjugate-13 01/15/2017   Pneumococcal Polysaccharide-23 04/22/2012, 07/08/2018   Tdap 02/21/2010   Zoster, Live 11/04/2013    Health Maintenance  Topic Date Due   Zoster Vaccines- Shingrix (1 of 2) 12/03/1970   DTaP/Tdap/Td (2 - Td or Tdap) 02/22/2020   COVID-19  Vaccine (5 - 2024-25 season) 02/07/2024 (Originally 02/16/2023)   INFLUENZA VACCINE  09/14/2024 (Originally 01/16/2024)   MAMMOGRAM  03/06/2024   Medicare Annual Wellness (AWV)  10/07/2024   Lung Cancer Screening  10/26/2024   DEXA SCAN  12/14/2025   Colonoscopy  11/20/2027   Pneumococcal Vaccine: 50+ Years  Completed   Hepatitis C Screening  Completed   Hepatitis B Vaccines  Aged Out   HPV VACCINES  Aged Out   Meningococcal B Vaccine  Aged Out    Discussed health benefits of physical activity, and encouraged her to engage in regular exercise appropriate for her age and condition.  Problem List Items Addressed This Visit       Cardiovascular and Mediastinum   Essential hypertension     Other   Hypercholesteremia   [name] (HCC)   Relevant Orders   Iron, TIBC and Ferritin Panel   Recurrent major depressive disorder, in full remission (HCC)   Chronic and stable Continue lexapro  at current dose      Prediabetes   Relevant Orders   Hemoglobin A1c   Other Visit Diagnoses       Encounter for annual physical exam    -  Primary   Relevant  Orders   Comprehensive Metabolic Panel (CMET)   Lipid Panel With LDL/HDL Ratio   CBC w/Diff/Platelet   Iron, TIBC and Ferritin Panel   Hemoglobin A1c     Psoriasis       Relevant Medications   clobetasol  ointment (TEMOVATE ) 0.05 %     Breast cancer screening by mammogram       Relevant Orders   MM 3D SCREENING MAMMOGRAM BILATERAL BREAST          Adult Wellness Visit Annual physical examination with no new concerns. Up to date on lung cancer screening and pneumonia vaccinations. Due for mammogram, shingles, and tetanus vaccinations. - Order mammogram referral - Discuss flu vaccination timing - Discuss shingles and tetanus vaccinations  Status post right reverse shoulder arthroplasty Post-operative recovery from reverse shoulder arthroplasty on July 10th. Undergoing physical therapy twice a week. Bruising and blood pressure spike post-surgery possibly related to pain response rather than malignant hypertension. - Continue physical therapy twice a week - Discuss post-operative blood pressure spike with orthopedic surgeon  Hypertension Hypertension managed with amlodipine  and losartan . Recent blood pressure reading was 138/70 mmHg, indicating controlled hypertension. Post-operative blood pressure spike possibly due to pain response rather than malignant hypertension. - Recheck blood pressure before leaving - Discuss post-operative blood pressure spike with orthopedic surgeon  Chronic obstructive pulmonary disease (COPD) COPD managed with Stiolto and albuterol  as needed.  Prediabetes Prediabetes monitored through regular A1c testing. - Order A1c test  Hyperlipidemia Hyperlipidemia managed with Nexelzet. Due for cholesterol check. - Order cholesterol test  Major depressive disorder, recurrent Managed with Lexapro  20 mg daily. No new symptoms or concerns reported.  [name] Regular monitoring of iron levels and blood counts. Labs to be coordinated to avoid  multiple blood draws. - Order iron panel and blood counts       Return in about 1 year (around 01/21/2025) for CPE.     Jon Eva, MD  [facility] [phone] (phone) [phone] (fax)  [facility]

## 2024-01-22 NOTE — Assessment & Plan Note (Signed)
Chronic and stable Continue lexapro at current dose 

## 2024-01-23 LAB — CBC WITH DIFFERENTIAL/PLATELET
Basophils Absolute: 0.1 x10E3/uL (ref 0.0–0.2)
Basos: 1 %
EOS (ABSOLUTE): 0.1 x10E3/uL (ref 0.0–0.4)
Eos: 1 %
Hematocrit: 45.9 % (ref 34.0–46.6)
Hemoglobin: 14.9 g/dL (ref 11.1–15.9)
Immature Grans (Abs): 0.1 x10E3/uL (ref 0.0–0.1)
Immature Granulocytes: 1 %
Lymphocytes Absolute: 1.1 x10E3/uL (ref 0.7–3.1)
Lymphs: 9 %
MCH: 32.1 pg (ref 26.6–33.0)
MCHC: 32.5 g/dL (ref 31.5–35.7)
MCV: 99 fL — ABNORMAL HIGH (ref 79–97)
Monocytes Absolute: 0.2 x10E3/uL (ref 0.1–0.9)
Monocytes: 1 %
Neutrophils Absolute: 10.6 x10E3/uL — ABNORMAL HIGH (ref 1.4–7.0)
Neutrophils: 87 %
Platelets: 385 x10E3/uL (ref 150–450)
RBC: 4.64 x10E6/uL (ref 3.77–5.28)
RDW: 12.5 % (ref 11.7–15.4)
WBC: 12.1 x10E3/uL — ABNORMAL HIGH (ref 3.4–10.8)

## 2024-01-23 LAB — COMPREHENSIVE METABOLIC PANEL WITH GFR
ALT: 31 IU/L (ref 0–32)
AST: 27 IU/L (ref 0–40)
Albumin: 4.8 g/dL (ref 3.8–4.8)
Alkaline Phosphatase: 80 IU/L (ref 44–121)
BUN/Creatinine Ratio: 30 — ABNORMAL HIGH (ref 12–28)
BUN: 20 mg/dL (ref 8–27)
Bilirubin Total: 0.3 mg/dL (ref 0.0–1.2)
CO2: 24 mmol/L (ref 20–29)
Calcium: 10.6 mg/dL — ABNORMAL HIGH (ref 8.7–10.3)
Chloride: 99 mmol/L (ref 96–106)
Creatinine, Ser: 0.67 mg/dL (ref 0.57–1.00)
Globulin, Total: 2 g/dL (ref 1.5–4.5)
Glucose: 157 mg/dL — ABNORMAL HIGH (ref 70–99)
Potassium: 4.1 mmol/L (ref 3.5–5.2)
Sodium: 141 mmol/L (ref 134–144)
Total Protein: 6.8 g/dL (ref 6.0–8.5)
eGFR: 93 mL/min/1.73 (ref >59)

## 2024-01-23 LAB — IRON,TIBC AND FERRITIN PANEL
Ferritin: 242 ng/mL — ABNORMAL HIGH (ref 15–150)
Iron Saturation: 30 % (ref 15–55)
Iron: 103 ug/dL (ref 27–139)
Total Iron Binding Capacity: 349 ug/dL (ref 250–450)
UIBC: 246 ug/dL (ref 118–369)

## 2024-01-23 LAB — LIPID PANEL WITH LDL/HDL RATIO
Cholesterol, Total: 197 mg/dL (ref 100–199)
HDL: 55 mg/dL (ref >39)
LDL Chol Calc (NIH): 120 mg/dL — ABNORMAL HIGH (ref 0–99)
LDL/HDL Ratio: 2.2 ratio (ref 0.0–3.2)
Triglycerides: 125 mg/dL (ref 0–149)
VLDL Cholesterol Cal: 22 mg/dL (ref 5–40)

## 2024-01-23 LAB — HEMOGLOBIN A1C
Est. average glucose Bld gHb Est-mCnc: 114 mg/dL
Hgb A1c MFr Bld: 5.6 % (ref 4.8–5.6)

## 2024-01-27 ENCOUNTER — Ambulatory Visit: Payer: Self-pay | Admitting: Family Medicine

## 2024-01-27 ENCOUNTER — Inpatient Hospital Stay: Payer: PPO | Attending: Internal Medicine

## 2024-01-27 DIAGNOSIS — G500 Trigeminal neuralgia: Secondary | ICD-10-CM | POA: Insufficient documentation

## 2024-01-27 DIAGNOSIS — F17210 Nicotine dependence, cigarettes, uncomplicated: Secondary | ICD-10-CM | POA: Insufficient documentation

## 2024-01-27 DIAGNOSIS — D72829 Elevated white blood cell count, unspecified: Secondary | ICD-10-CM | POA: Insufficient documentation

## 2024-01-27 DIAGNOSIS — D751 Secondary polycythemia: Secondary | ICD-10-CM | POA: Insufficient documentation

## 2024-01-27 DIAGNOSIS — L4050 Arthropathic psoriasis, unspecified: Secondary | ICD-10-CM | POA: Insufficient documentation

## 2024-01-27 DIAGNOSIS — F1021 Alcohol dependence, in remission: Secondary | ICD-10-CM | POA: Insufficient documentation

## 2024-01-27 DIAGNOSIS — R911 Solitary pulmonary nodule: Secondary | ICD-10-CM | POA: Insufficient documentation

## 2024-01-27 DIAGNOSIS — E279 Disorder of adrenal gland, unspecified: Secondary | ICD-10-CM | POA: Insufficient documentation

## 2024-01-28 DIAGNOSIS — M25511 Pain in right shoulder: Secondary | ICD-10-CM | POA: Diagnosis not present

## 2024-01-28 DIAGNOSIS — G8929 Other chronic pain: Secondary | ICD-10-CM | POA: Diagnosis not present

## 2024-01-28 DIAGNOSIS — M25611 Stiffness of right shoulder, not elsewhere classified: Secondary | ICD-10-CM | POA: Diagnosis not present

## 2024-01-30 DIAGNOSIS — M25511 Pain in right shoulder: Secondary | ICD-10-CM | POA: Diagnosis not present

## 2024-01-30 DIAGNOSIS — M25611 Stiffness of right shoulder, not elsewhere classified: Secondary | ICD-10-CM | POA: Diagnosis not present

## 2024-01-30 DIAGNOSIS — G8929 Other chronic pain: Secondary | ICD-10-CM | POA: Diagnosis not present

## 2024-02-02 DIAGNOSIS — Z96611 Presence of right artificial shoulder joint: Secondary | ICD-10-CM | POA: Diagnosis not present

## 2024-02-03 ENCOUNTER — Inpatient Hospital Stay (HOSPITAL_BASED_OUTPATIENT_CLINIC_OR_DEPARTMENT_OTHER): Payer: PPO | Admitting: Internal Medicine

## 2024-02-03 ENCOUNTER — Inpatient Hospital Stay: Payer: PPO

## 2024-02-03 ENCOUNTER — Other Ambulatory Visit

## 2024-02-03 ENCOUNTER — Encounter: Payer: Self-pay | Admitting: Internal Medicine

## 2024-02-03 ENCOUNTER — Other Ambulatory Visit: Payer: Self-pay | Admitting: Family Medicine

## 2024-02-03 DIAGNOSIS — R911 Solitary pulmonary nodule: Secondary | ICD-10-CM | POA: Diagnosis not present

## 2024-02-03 DIAGNOSIS — D72829 Elevated white blood cell count, unspecified: Secondary | ICD-10-CM | POA: Diagnosis not present

## 2024-02-03 DIAGNOSIS — D751 Secondary polycythemia: Secondary | ICD-10-CM | POA: Diagnosis not present

## 2024-02-03 DIAGNOSIS — G8929 Other chronic pain: Secondary | ICD-10-CM | POA: Diagnosis not present

## 2024-02-03 DIAGNOSIS — F17210 Nicotine dependence, cigarettes, uncomplicated: Secondary | ICD-10-CM | POA: Diagnosis not present

## 2024-02-03 DIAGNOSIS — M25511 Pain in right shoulder: Secondary | ICD-10-CM | POA: Diagnosis not present

## 2024-02-03 DIAGNOSIS — F410 Panic disorder [episodic paroxysmal anxiety] without agoraphobia: Secondary | ICD-10-CM

## 2024-02-03 DIAGNOSIS — M25611 Stiffness of right shoulder, not elsewhere classified: Secondary | ICD-10-CM | POA: Diagnosis not present

## 2024-02-03 DIAGNOSIS — E279 Disorder of adrenal gland, unspecified: Secondary | ICD-10-CM | POA: Diagnosis not present

## 2024-02-03 DIAGNOSIS — G500 Trigeminal neuralgia: Secondary | ICD-10-CM | POA: Diagnosis not present

## 2024-02-03 DIAGNOSIS — F1021 Alcohol dependence, in remission: Secondary | ICD-10-CM | POA: Diagnosis not present

## 2024-02-03 DIAGNOSIS — L4050 Arthropathic psoriasis, unspecified: Secondary | ICD-10-CM | POA: Diagnosis not present

## 2024-02-03 NOTE — Progress Notes (Signed)
 [facility] OFFICE PROGRESS NOTE  Patient Care Team: Myrla Jon HERO, MD as PCP - General (Family Medicine) Darliss Rogue, MD as PCP - Cardiology (Cardiology) Rennie Cindy SAUNDERS, MD as Consulting Physician (Oncology) Cherilyn Debby CROME, MD as Consulting Physician (Internal Medicine) Severa Milo, NP as Nurse Practitioner (Pain Medicine) Pa, [facility] (Optometry) Tobie Lady POUR, MD as Consulting Physician (Rheumatology) Maree Jannett POUR, MD as Consulting Physician (Neurology) Twylla Glendia BROCKS, MD (Urology) Marchia Drivers, MD as Referring Physician (Orthopedic Surgery) Levi Charleen CROME, MD as Referring Physician (Pain Medicine) Jordan, Jimmy J, MD as Referring Physician (Physical Medicine and Rehabilitation) Blair Mt, MD as Referring Physician (Otolaryngology) Isadora Hose, MD as Consulting Physician (Pulmonary Disease)   SUMMARY OF HEMATOLOGIC/ONCOLOGIC HISTORY:  # April 2015- 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 Maree Cymbalta   INTERVAL HISTORY: Alone.  Ambulating independently.  A very pleasant 73 year-old female patient with above history of  [name] following screening  without any end organ dysfunction; and hx of smoking and  lung nodule on surveillaince  is here for follow-up /phlebotomy.   Patient complains of ongoing right shoulder surgery- she is healing well.    Denies any worsening weight loss or abdominal swelling or leg swelling. Unfortunately she continues to smoke.  Review of Systems  Constitutional:  Positive for malaise/fatigue. Negative for chills, diaphoresis, fever and weight loss.  HENT:  Negative for  nosebleeds and sore throat.   Eyes:  Negative for double vision.  Respiratory:  Negative for cough, hemoptysis, sputum production, shortness of breath and wheezing.   Cardiovascular:  Negative for chest pain, palpitations, orthopnea and leg swelling.  Gastrointestinal:  Negative for abdominal pain, blood in stool, constipation, diarrhea, heartburn, melena, nausea and vomiting.  Genitourinary:  Negative for dysuria, frequency and urgency.  Musculoskeletal:  Positive for back pain, joint pain, myalgias and neck pain.  Skin: Negative.  Negative for itching and rash.  Neurological:  Negative for dizziness, tingling, focal weakness, weakness and headaches.  Endo/Heme/Allergies:  Bruises/bleeds easily.  Psychiatric/Behavioral:  Negative for depression. The patient is not nervous/anxious and does not have insomnia.       PAST MEDICAL HISTORY :  Past Medical History:  Diagnosis Date   Allergic rhinitis    Allergy    Annular oral lichen planus    Anxiety    Arthritis    Depression    Deviated nasal septum    nasal obstruction   Guaiac positive stools    history   H. 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   History of palpitations    Hyperlipidemia    Hypertension    Migraine    Myocardial infarction (HCC)    Osteoarthritis    Osteoporosis    Panic disorder    Psoriasis    Psoriatic arthritis (HCC)    Rheumatoid arteritis (HCC)    Tinnitus     PAST SURGICAL HISTORY :   Past Surgical History:  Procedure Laterality Date   ABDOMINAL HYSTERECTOMY  1983   Ovaries have been removed.   ABDOMINOPLASTY     APPENDECTOMY     BREAST BIOPSY Right    neg- core   BRONCHIAL BRUSHINGS  08/21/2023   Procedure: BRONCHOSCOPY, WITH BRUSH BIOPSY;  Surgeon:  Malka Domino, MD;  Location: [facility] ENDOSCOPY;  Service: Pulmonary;;   BRONCHIAL NEEDLE ASPIRATION BIOPSY  08/21/2023   Procedure: BRONCHOSCOPY, WITH NEEDLE ASPIRATION BIOPSY;  Surgeon: Malka Domino, MD;  Location: [facility] ENDOSCOPY;  Service: Pulmonary;;   BRONCHIAL WASHINGS  08/21/2023   Procedure: IRRIGATION, BRONCHUS;  Surgeon: Malka Domino, MD;  Location: [facility] ENDOSCOPY;  Service: Pulmonary;;   CARDIAC CATHETERIZATION     COLONOSCOPY  03/2006   COLONOSCOPY WITH PROPOFOL  N/A 11/19/2017   Procedure: COLONOSCOPY WITH PROPOFOL ;  Surgeon: Toledo, Ladell POUR, MD;  Location: [facility] ENDOSCOPY;  Service: Gastroenterology;  Laterality: N/A;   CORONARY/GRAFT ACUTE MI REVASCULARIZATION N/A 02/24/2022   Procedure: Coronary/Graft Acute MI Revascularization;  Surgeon: Darron Deatrice LABOR, MD;  Location: [facility] INVASIVE CV LAB;  Service: Cardiovascular;  Laterality: N/A;   ENDOBRONCHIAL ULTRASOUND Bilateral 08/21/2023   Procedure: ENDOBRONCHIAL ULTRASOUND (EBUS);  Surgeon: Malka Domino, MD;  Location: [facility] ENDOSCOPY;  Service: Pulmonary;  Laterality: Bilateral;   ENDOSCOPIC CONCHA BULLOSA RESECTION Right 01/30/2021   Procedure: ENDOSCOPIC CONCHA BULLOSA RESECTION;  Surgeon: Blair Mt, MD;  Location: [facility] SURGERY CNTR;  Service: ENT;  Laterality: Right;   ESOPHAGOGASTRODUODENOSCOPY N/A 03/19/2022   Procedure: ESOPHAGOGASTRODUODENOSCOPY (EGD);  Surgeon: Maryruth Ole DASEN, MD;  Location: [facility] ENDOSCOPY;  Service: Gastroenterology;  Laterality: N/A;   LEFT HEART CATH AND CORONARY ANGIOGRAPHY N/A 02/24/2022   Procedure: LEFT HEART CATH AND CORONARY ANGIOGRAPHY;  Surgeon: Darron Deatrice LABOR, MD;  Location: [facility] INVASIVE CV LAB;  Service: Cardiovascular;  Laterality: N/A;   REVERSE SHOULDER ARTHROPLASTY Right 12/25/2023   RHINOPLASTY  2002   SEPTOPLASTY N/A 01/30/2021   Procedure: SEPTOPLASTY;  Surgeon: Blair Mt, MD;  Location: [facility];  Service: ENT;  Laterality: N/A;   UPPER GI ENDOSCOPY  2008    FAMILY HISTORY :   Family History  Problem Relation Age of Onset   Hypertension Mother    Hyperlipidemia Mother    Hypothyroidism Mother    Fibromyalgia Mother     Hearing loss Mother    Heart attack Mother    Lung cancer Father    Coronary artery disease Father    Hyperlipidemia Father    Hypertension Father    Heart attack Father    Hearing loss Sister    Hyperlipidemia Sister    Hypertension Sister    Fibromyalgia Sister    Hemochromatosis Sister    Hyperlipidemia Sister    Hypertension Sister    ADD / ADHD Brother    Breast cancer Neg Hx     SOCIAL HISTORY:   Social History   Tobacco Use   Smoking status: Every Day    Current packs/day: 1.00    Average packs/day: 1 pack/day for 54.6 years (54.6 ttl pk-yrs)    Types: Cigarettes    Start date: 1971   Smokeless tobacco: Never   Tobacco comments:    Smokes 1 PPD- khj 12/16/2023  Vaping Use   Vaping status: Never Used  Substance Use Topics   Alcohol use: Not Currently   Drug use: No    ALLERGIES:  is allergic to enbrel [etanercept], modafinil, augmentin  [amoxicillin-pot clavulanate], cefdinir , lisinopril, and macrobid  [nitrofurantoin ].  MEDICATIONS:  Current Outpatient Medications  Medication Sig Dispense Refill   albuterol  (VENTOLIN  HFA) 108 (90 Base) MCG/ACT inhaler Inhale 2 puffs into the lungs every 6 (six) hours as needed for wheezing or shortness of breath. 8 g 2   amLODipine  (NORVASC ) 5 MG tablet TAKE 1 TABLET BY MOUTH DAILY 90 tablet 1   azelastine (ASTELIN)  0.1 % nasal spray Place 1 spray into both nostrils daily as needed.     Bempedoic Acid-Ezetimibe  (NEXLIZET ) 180-10 MG TABS Take 1 tablet by mouth daily. 30 tablet 3   clobetasol  ointment (TEMOVATE ) 0.05 % APP TOPICALLY TO GLUTEAL AREA ONCE D PRF DERMATITIS 30 g 1   escitalopram  (LEXAPRO ) 20 MG tablet TAKE ONE TABLET BY MOUTH EVERY DAY 30 tablet 0   fluticasone  (FLONASE ) 50 MCG/ACT nasal spray Place 2 sprays into both nostrils as needed for allergies or rhinitis.     gabapentin  (NEURONTIN ) 100 MG capsule Take 100 mg by mouth 2 (two) times daily as needed.     gabapentin  (NEURONTIN ) 300 MG capsule Take 300 mg by mouth  as needed (qhs). PRN     loratadine (CLARITIN) 10 MG tablet Take 10 mg by mouth daily.     losartan  (COZAAR ) 25 MG tablet TAKE 1 TABLET BY MOUTH DAILY 90 tablet 1   methocarbamol (ROBAXIN) 750 MG tablet Take 750 mg by mouth every 8 (eight) hours as needed for muscle spasms.     nitroGLYCERIN  (NITROSTAT ) 0.4 MG SL tablet Place 1 tablet (0.4 mg total) under the tongue every 5 (five) minutes as needed for chest pain. 30 tablet 12   ondansetron  (ZOFRAN -ODT) 4 MG disintegrating tablet Take 1 tablet (4 mg total) by mouth every 8 (eight) hours as needed for nausea or vomiting. 20 tablet 0   oxyCODONE -acetaminophen  (PERCOCET) 10-325 MG tablet Take 1 tablet by mouth every 4 (four) hours as needed for pain.     potassium chloride  (KLOR-CON  M) 10 MEQ tablet TAKE TWO TABLETS BY MOUTH EVERY DAY 60 tablet 1   predniSONE  (DELTASONE ) 10 MG tablet Take 10 mg by mouth daily.     Tiotropium Bromide-Olodaterol (STIOLTO RESPIMAT ) 2.5-2.5 MCG/ACT AERS Inhale 2 puffs into the lungs daily. 60 each 12   traZODone  (DESYREL ) 100 MG tablet TAKE 0.5-1 TABLET BY MOUTH AT BEDTIME. 30 tablet 5   No current facility-administered medications for this visit.   Facility-Administered Medications Ordered in Other Visits  Medication Dose Route Frequency Provider Last Rate Last Admin   albuterol  (PROVENTIL ) (2.5 MG/3ML) 0.083% nebulizer solution 2.5 mg  2.5 mg Nebulization Once Dgayli, Khabib, MD        PHYSICAL EXAMINATION: ECOG PERFORMANCE STATUS: 0 - Asymptomatic  BP (!) 153/84 (BP Location: Left Arm, Patient Position: Sitting, Cuff Size: Normal) Comment: pt advised bp elevated, keep check at home, contact pcp if con't to stay elevated-has not had meds today  Pulse 85   Temp 98.2 F (36.8 C) (Tympanic)   Resp 18   Ht 4' 11 (1.499 m)   Wt 113 lb 6.4 oz (51.4 kg)   SpO2 96%   BMI 22.90 kg/m   Filed Weights   02/03/24 1312  Weight: 113 lb 6.4 oz (51.4 kg)     Physical Exam Constitutional:      Comments: Ambulating  independently.  Alone.  HENT:     Head: Normocephalic and atraumatic.     Mouth/Throat:     Pharynx: No oropharyngeal exudate.  Eyes:     Pupils: Pupils are equal, round, and reactive to light.  Cardiovascular:     Rate and Rhythm: Normal rate and regular rhythm.  Pulmonary:     Effort: Pulmonary effort is normal. No respiratory distress.     Breath sounds: Normal breath sounds. No wheezing.  Abdominal:     General: Bowel sounds are normal. There is no distension.     Palpations: Abdomen  is soft. There is no mass.     Tenderness: There is no abdominal tenderness. There is no guarding or rebound.  Musculoskeletal:        General: No tenderness. Normal range of motion.     Cervical back: Normal range of motion and neck supple.  Skin:    General: Skin is warm.  Neurological:     Mental Status: She is alert and oriented to person, place, and time.  Psychiatric:        Mood and Affect: Affect normal.      LABORATORY DATA:  I have reviewed the data as listed    Component Value Date/Time   NA 141 01/22/2024 1635   K 4.1 01/22/2024 1635   CL 99 01/22/2024 1635   CO2 24 01/22/2024 1635   GLUCOSE 157 (H) 01/22/2024 1635   GLUCOSE 113 (H) 08/21/2023 0610   BUN 20 01/22/2024 1635   CREATININE 0.67 01/22/2024 1635   CREATININE 0.57 05/05/2017 1653   CALCIUM  10.6 (H) 01/22/2024 1635   PROT 6.8 01/22/2024 1635   ALBUMIN 4.8 01/22/2024 1635   AST 27 01/22/2024 1635   ALT 31 01/22/2024 1635   ALKPHOS 80 01/22/2024 1635   BILITOT 0.3 01/22/2024 1635   GFRNONAA >60 08/21/2023 0610   GFRNONAA 97 05/05/2017 1653   GFRAA 83 01/20/2020 1327   GFRAA 113 05/05/2017 1653    No results found for: SPEP, UPEP  Lab Results  Component Value Date   WBC 12.1 (H) 01/22/2024   NEUTROABS 10.6 (H) 01/22/2024   HGB 14.9 01/22/2024   HCT 45.9 01/22/2024   MCV 99 (H) 01/22/2024   PLT 385 01/22/2024      Chemistry      Component Value Date/Time   NA 141 01/22/2024 1635   K 4.1  01/22/2024 1635   CL 99 01/22/2024 1635   CO2 24 01/22/2024 1635   BUN 20 01/22/2024 1635   CREATININE 0.67 01/22/2024 1635   CREATININE 0.57 05/05/2017 1653   GLU 102 09/05/2014 0000      Component Value Date/Time   CALCIUM  10.6 (H) 01/22/2024 1635   ALKPHOS 80 01/22/2024 1635   AST 27 01/22/2024 1635   ALT 31 01/22/2024 1635   BILITOT 0.3 01/22/2024 1635       ASSESSMENT & PLAN:   [name] (HCC) # HEREDITARY  Hemochromatosis/ compound heterozygous C282Y & H63D- [> ferritin:150; Iron sa-50%]-  # AUG 2025- iron saturations 30%; ferritin  242.  Patient continues to be asymptomatic; no evidence of organ dysfunction/overload. HOLD phlebotomy today.  # LUNG NODULE: [Dr.Dgyali]- MAY 2025-1.2 cm  [8 mm on 04/29/2023] S/p evaluation with pulmonary.s/p Biopsy MARCH-NEG; awaiting follow up CT scan.     # Trigeminal neuralgia- [Dr.Shah]- defer to neurology- on gabapentin .   # Psoriasis arthritis-on? Enbrel-cost issue [Dr.Patel;[facility]; Rheum- Stable.    #Mild erythrocytosis hematocrit 47-likely secondary to smoking; recommend smoking cessation. Stable.   # Mild leucocytosis-   # Active smoker- LCSP-2023- CTA-reviewed negative for any malignancy.  Again counseled to quit smoking-cutting down. Stable.   DISPOSITION:  # HOLD Phlebotomy today # 6 months MD-  with labs [CBC,CMP- iron studies, ferritin; 1 week prior, MD/ possible phlebotomy- Dr.B      Cindy JONELLE Joe, MD 02/03/2024 1:47 PM

## 2024-02-03 NOTE — Assessment & Plan Note (Signed)
#   HEREDITARY  Hemochromatosis/ compound heterozygous C282Y & H63D- [> ferritin:150; Iron sa-50%]-  # AUG 2025- iron saturations 30%; ferritin  242.  Patient continues to be asymptomatic; no evidence of organ dysfunction/overload. HOLD phlebotomy today.  # LUNG NODULE: [Dr.Dgyali]- MAY 2025-1.2 cm  [8 mm on 04/29/2023] S/p evaluation with pulmonary.s/p Biopsy MARCH-NEG; awaiting follow up CT scan.     # Trigeminal neuralgia- [Dr.Shah]- defer to neurology- on gabapentin .   # Psoriasis arthritis-on? Enbrel-cost issue [Dr.Patel;[facility]; Rheum- Stable.    #Mild erythrocytosis hematocrit 47-likely secondary to smoking; recommend smoking cessation. Stable.   # Mild leucocytosis-   # Active smoker- LCSP-2023- CTA-reviewed negative for any malignancy.  Again counseled to quit smoking-cutting down. Stable.   DISPOSITION:  # HOLD Phlebotomy today # 6 months MD-  with labs [CBC,CMP- iron studies, ferritin; 1 week prior, MD/ possible phlebotomy- Dr.B

## 2024-02-03 NOTE — Progress Notes (Signed)
No phlebotomy today per Dr Rogue Bussing.

## 2024-02-03 NOTE — Progress Notes (Signed)
 12/20/23 reverse rt shoulder surgery, [facility], Dr. Leora.

## 2024-02-12 DIAGNOSIS — M810 Age-related osteoporosis without current pathological fracture: Secondary | ICD-10-CM | POA: Diagnosis not present

## 2024-02-12 DIAGNOSIS — E559 Vitamin D deficiency, unspecified: Secondary | ICD-10-CM | POA: Diagnosis not present

## 2024-02-12 DIAGNOSIS — E0590 Thyrotoxicosis, unspecified without thyrotoxic crisis or storm: Secondary | ICD-10-CM | POA: Diagnosis not present

## 2024-02-19 DIAGNOSIS — M25511 Pain in right shoulder: Secondary | ICD-10-CM | POA: Diagnosis not present

## 2024-02-19 DIAGNOSIS — M810 Age-related osteoporosis without current pathological fracture: Secondary | ICD-10-CM | POA: Diagnosis not present

## 2024-02-19 DIAGNOSIS — M5459 Other low back pain: Secondary | ICD-10-CM | POA: Diagnosis not present

## 2024-02-19 DIAGNOSIS — Z79899 Other long term (current) drug therapy: Secondary | ICD-10-CM | POA: Diagnosis not present

## 2024-02-19 DIAGNOSIS — M8438XA Stress fracture, other site, initial encounter for fracture: Secondary | ICD-10-CM | POA: Diagnosis not present

## 2024-02-19 DIAGNOSIS — M5481 Occipital neuralgia: Secondary | ICD-10-CM | POA: Diagnosis not present

## 2024-02-19 DIAGNOSIS — M5412 Radiculopathy, cervical region: Secondary | ICD-10-CM | POA: Diagnosis not present

## 2024-02-19 DIAGNOSIS — G894 Chronic pain syndrome: Secondary | ICD-10-CM | POA: Diagnosis not present

## 2024-02-19 DIAGNOSIS — M5416 Radiculopathy, lumbar region: Secondary | ICD-10-CM | POA: Diagnosis not present

## 2024-02-24 DIAGNOSIS — R760 Raised antibody titer: Secondary | ICD-10-CM | POA: Diagnosis not present

## 2024-02-24 DIAGNOSIS — M539 Dorsopathy, unspecified: Secondary | ICD-10-CM | POA: Diagnosis not present

## 2024-02-24 DIAGNOSIS — M797 Fibromyalgia: Secondary | ICD-10-CM | POA: Diagnosis not present

## 2024-02-24 DIAGNOSIS — L409 Psoriasis, unspecified: Secondary | ICD-10-CM | POA: Diagnosis not present

## 2024-02-24 DIAGNOSIS — R768 Other specified abnormal immunological findings in serum: Secondary | ICD-10-CM | POA: Diagnosis not present

## 2024-02-24 DIAGNOSIS — L4050 Arthropathic psoriasis, unspecified: Secondary | ICD-10-CM | POA: Diagnosis not present

## 2024-03-04 DIAGNOSIS — J019 Acute sinusitis, unspecified: Secondary | ICD-10-CM | POA: Diagnosis not present

## 2024-03-04 DIAGNOSIS — B9689 Other specified bacterial agents as the cause of diseases classified elsewhere: Secondary | ICD-10-CM | POA: Diagnosis not present

## 2024-03-04 DIAGNOSIS — R059 Cough, unspecified: Secondary | ICD-10-CM | POA: Diagnosis not present

## 2024-03-04 DIAGNOSIS — R0981 Nasal congestion: Secondary | ICD-10-CM | POA: Diagnosis not present

## 2024-03-05 ENCOUNTER — Ambulatory Visit: Payer: Self-pay

## 2024-03-06 ENCOUNTER — Other Ambulatory Visit: Payer: Self-pay | Admitting: Family Medicine

## 2024-03-06 DIAGNOSIS — F410 Panic disorder [episodic paroxysmal anxiety] without agoraphobia: Secondary | ICD-10-CM

## 2024-03-08 NOTE — Telephone Encounter (Signed)
 Requested Prescriptions  Pending Prescriptions Disp Refills   escitalopram  (LEXAPRO ) 20 MG tablet [Pharmacy Med Name: ESCITALOPRAM  OXALATE 20 MG TAB] 30 tablet 0    Sig: TAKE ONE TABLET BY MOUTH ONCE DAILY     Psychiatry:  Antidepressants - SSRI Passed - 03/08/2024  1:57 PM      Passed - Completed PHQ-2 or PHQ-9 in the last 360 days      Passed - Valid encounter within last 6 months    Recent Outpatient Visits           1 month ago Encounter for annual physical exam   [facility] [place], Jon HERO, MD       Future Appointments             In 10 months Bacigalupo, Jon HERO, MD [facility] [place]

## 2024-03-09 ENCOUNTER — Telehealth: Payer: Self-pay

## 2024-03-09 DIAGNOSIS — M810 Age-related osteoporosis without current pathological fracture: Secondary | ICD-10-CM | POA: Diagnosis not present

## 2024-03-09 NOTE — Telephone Encounter (Signed)
 Lm x1 for the patient.

## 2024-03-09 NOTE — Telephone Encounter (Signed)
 Copied from CRM [id number]. Topic: General - Other >> Mar 09, 2024  3:56 PM Russell PARAS wrote: Reason for CRM:   Pt contacted office to cancel her scheduled PFT. She is unsure why she is needing the breathing test, when she had one performed last year that was normal.   She reports being referred to [facility] by Dgayli to help manage her pain due to ILD, but says at the visit with him he seemed to be confused as to why she was referred to him.   Requested call back to discuss further  CB#  [phone]

## 2024-03-10 NOTE — Telephone Encounter (Signed)
 I spoke with the patient. She said she in confused and wants to know why she needs another PFT when she just had one. She saw Dr. Geronimo and would prefer to keep seeing you and not return to him.  Please advise.

## 2024-03-10 NOTE — Telephone Encounter (Signed)
 Per Dr. Isadora- ok to cancel PFT and follow up with him.  I have canceled the PFT and notified the patient. She is already scheduled to see Dr. Isadora on 10/6. Nothing further needed.

## 2024-03-11 DIAGNOSIS — Z96611 Presence of right artificial shoulder joint: Secondary | ICD-10-CM | POA: Diagnosis not present

## 2024-03-11 DIAGNOSIS — M25511 Pain in right shoulder: Secondary | ICD-10-CM | POA: Diagnosis not present

## 2024-03-12 ENCOUNTER — Encounter

## 2024-03-15 DIAGNOSIS — G5603 Carpal tunnel syndrome, bilateral upper limbs: Secondary | ICD-10-CM | POA: Diagnosis not present

## 2024-03-15 DIAGNOSIS — G939 Disorder of brain, unspecified: Secondary | ICD-10-CM | POA: Diagnosis not present

## 2024-03-15 DIAGNOSIS — Z8673 Personal history of transient ischemic attack (TIA), and cerebral infarction without residual deficits: Secondary | ICD-10-CM | POA: Diagnosis not present

## 2024-03-15 DIAGNOSIS — G500 Trigeminal neuralgia: Secondary | ICD-10-CM | POA: Diagnosis not present

## 2024-03-22 ENCOUNTER — Ambulatory Visit: Admitting: Student in an Organized Health Care Education/Training Program

## 2024-03-22 ENCOUNTER — Encounter: Payer: Self-pay | Admitting: Student in an Organized Health Care Education/Training Program

## 2024-03-22 ENCOUNTER — Other Ambulatory Visit: Payer: Self-pay | Admitting: Family Medicine

## 2024-03-22 VITALS — BP 138/76 | HR 98 | Temp 97.5°F | Ht 59.0 in | Wt 116.8 lb

## 2024-03-22 DIAGNOSIS — L409 Psoriasis, unspecified: Secondary | ICD-10-CM

## 2024-03-22 DIAGNOSIS — J449 Chronic obstructive pulmonary disease, unspecified: Secondary | ICD-10-CM | POA: Diagnosis not present

## 2024-03-22 DIAGNOSIS — J849 Interstitial pulmonary disease, unspecified: Secondary | ICD-10-CM

## 2024-03-22 DIAGNOSIS — R911 Solitary pulmonary nodule: Secondary | ICD-10-CM

## 2024-03-22 MED ORDER — TRELEGY ELLIPTA 200-62.5-25 MCG/ACT IN AEPB: 1.0000 | INHALATION_SPRAY | Freq: Every day | RESPIRATORY_TRACT | 12 refills | Status: DC

## 2024-03-22 NOTE — Progress Notes (Signed)
 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 She does follow with rheumatology for psoriatic arthritis. Auto-immune workup shows a previously positive ANA (1:1280) in 2021 and anti-CCP (27) in 2021. Myomarker panel was notable for a mildly positive anti-MDA-5 antibody (21 units, <20 is negative). These were repeated recently, and both ANA and anti-MDA-5 antibodies were negative. Anti-CCP was mildly above the threshold of detection at 21. She was on Golimumab  for psoriatic arthritis and is being considered for adalimumab.   The differential diagnosis includes CTD-ILD, with a differential of RA (currently in agreement that positive antibodies were likely false positive) vs mixed connective tissue disease vs MDA-5 associated ILD (negative on repeat) vs idiopathic interstitial pneumonia with auto-immune features. Based on discussion in ILD board and second opinion with ILD specialist Dr. Geronimo, favor IPAF.  Given she has minimal respiratory symptoms, and PFT's showing a lower limit of normal DLCO (84% predicted), we will hold off on the initiation of anti-fibrotic therapy, and continue to monitor her clinically for any signs of disease progression. Should that be the case, will initiate anti-fibrotic therapy.   - Pulmonary Function Test; Future  #Chronic obstructive pulmonary disease, unspecified COPD type (HCC) (Primary)  PFT's show mild obstruction with normal FEV1/FVC and bronchodilator response. 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 We have initiated her on LABA/LAMA therapy and she did have a recent exacerbation that did benefit from prednisone  therapy. Will step up therapy to ICS/LABA/LAMA.  - Fluticasone -Umeclidin-Vilant (TRELEGY ELLIPTA) 200-62.5-25 MCG/ACT AEPB; Inhale 1  puff into the lungs daily.  Dispense: 30 each; Refill: 12 - Pulmonary Function Test; Future  #Pulmonary Nodule   Patient with a RLL pulmonary nodule that showed mild FDG avidity on PET/CT. Given concern for malignancy, patient underwent robotic assisted navigational bronchoscopy to the RLL nodule with my colleague Dr. Malka with biopsy returning negative for malignancy.   The differential for the nodule includes malignancy as well as benign/inflammatory causes (infectious, rheumatoid nodule, confluence of inflammation secondary to ILD). I have also reviewed the images and note a blood vessel leading to the pulmonary nodule. This was further evaluated by CTA and no AVM was noted in the nodule. Furthermore, the nodule was noted to be stable on repeat CT. I have discussed our options with April [place] during a prior visit and offered repeat biopsy vs radiographic surveillance. Through shared decision making, she elected for short term radiographic surveillance with interval 6 month repeat CT.   -repeat CT chest at 6 months mark (04/2024)  I spent 30 minutes caring for this patient today, including preparing to see the patient, obtaining a medical history , reviewing a separately obtained history, performing a medically appropriate examination and/or evaluation, counseling and educating the patient/family/caregiver, ordering medications, tests, or procedures, documenting clinical information in the electronic health record, and independently interpreting results (not separately reported/billed) and communicating results to the patient/family/caregiver  April November, MD [facility]  End of visit medications:  Meds ordered this encounter  Medications   Fluticasone -Umeclidin-Vilant (TRELEGY ELLIPTA) 200-62.5-25 MCG/ACT AEPB    Sig: Inhale 1 puff into the lungs daily.    Dispense:  30 each    Refill:  12     Current Outpatient Medications:    albuterol  (VENTOLIN  HFA) 108 (90  Base) MCG/ACT inhaler, Inhale 2 puffs into the  lungs every 6 (six) hours as needed for wheezing or shortness of breath., Disp: 8 g, Rfl: 2   amLODipine  (NORVASC ) 5 MG tablet, TAKE 1 TABLET BY MOUTH DAILY, Disp: 90 tablet, Rfl: 1   azelastine (ASTELIN) 0.1 % nasal spray, Place 1 spray into both nostrils daily as needed., Disp: , Rfl:    Bempedoic Acid-Ezetimibe  (NEXLIZET ) 180-10 MG TABS, Take 1 tablet by mouth daily., Disp: 30 tablet, Rfl: 3   clobetasol  ointment (TEMOVATE ) 0.05 %, APP TOPICALLY TO GLUTEAL AREA ONCE D PRF DERMATITIS, Disp: 30 g, Rfl: 1   escitalopram  (LEXAPRO ) 20 MG tablet, TAKE ONE TABLET BY MOUTH ONCE DAILY, Disp: 30 tablet, Rfl: 0   fluticasone  (FLONASE ) 50 MCG/ACT nasal spray, Place 2 sprays into both nostrils as needed for allergies or rhinitis., Disp: , Rfl:    Fluticasone -Umeclidin-Vilant (TRELEGY ELLIPTA) 200-62.5-25 MCG/ACT AEPB, Inhale 1 puff into the lungs daily., Disp: 30 each, Rfl: 12   gabapentin  (NEURONTIN ) 100 MG capsule, Take 100 mg by mouth 2 (two) times daily as needed., Disp: , Rfl:    gabapentin  (NEURONTIN ) 300 MG capsule, Take 300 mg by mouth as needed (qhs). PRN, Disp: , Rfl:    loratadine (CLARITIN) 10 MG tablet, Take 10 mg by mouth daily., Disp: , Rfl:    losartan  (COZAAR ) 25 MG tablet, TAKE 1 TABLET BY MOUTH DAILY, Disp: 90 tablet, Rfl: 1   methocarbamol (ROBAXIN) 750 MG tablet, Take 750 mg by mouth every 8 (eight) hours as needed for muscle spasms., Disp: , Rfl:    nitroGLYCERIN  (NITROSTAT ) 0.4 MG SL tablet, Place 1 tablet (0.4 mg total) under the tongue every 5 (five) minutes as needed for chest pain., Disp: 30 tablet, Rfl: 12   ondansetron  (ZOFRAN -ODT) 4 MG disintegrating tablet, Take 1 tablet (4 mg total) by mouth every 8 (eight) hours as needed for nausea or vomiting., Disp: 20 tablet, Rfl: 0   oxyCODONE -acetaminophen  (PERCOCET) 10-325 MG tablet, Take 1 tablet by mouth every 4 (four) hours as needed for pain., Disp: , Rfl:    potassium chloride  (KLOR-CON   M) 10 MEQ tablet, TAKE TWO TABLETS BY MOUTH EVERY DAY, Disp: 60 tablet, Rfl: 1   predniSONE  (DELTASONE ) 10 MG tablet, Take 10 mg by mouth daily., Disp: , Rfl:    traZODone  (DESYREL ) 100 MG tablet, TAKE 0.5-1 TABLET BY MOUTH AT BEDTIME., Disp: 30 tablet, Rfl: 5 No current facility-administered medications for this visit.  Facility-Administered Medications Ordered in Other Visits:    albuterol  (PROVENTIL ) (2.5 MG/3ML) 0.083% nebulizer solution 2.5 mg, 2.5 mg, Nebulization, Once, Isadora Hose, MD   Subjective:   PATIENT ID: April Best GENDER: female DOB: [birth year], MRN: [id number]  Chief Complaint  Patient presents with   Interstitial Lung Disease    Had had a virus for 4 weeks. Cough with brown sputum. Wheezing.  Saw Urgent Care 2 weeks and got an antibiotic.     HPI  April Best is a pleasant 73 year old female with a past medical history of psoriatic arthritis followed by Dr. Tobie from rheumatology presenting for follow up. The purpose of today's visit is pre-operative risk assessment prior to planned orthopedic surgery.   OV 10/28/23: Since our last visit she continues to experience cough and shortness of breath with exertion.  She does feel that the cough is somewhat better.  Her shortness of breath is alleviated with the use of albuterol . She did have an episode in March with increased shortness of breath and cough prompting a visit to urgent care  where she was treated with steroids and antibiotics for an exacerbation of COPD.   OV 12/16/23: Patient reports stable respiratory symptoms since our last visit. She continues to experience a mild cough and exertional dyspnea. This is unchanged from prior. She does report generalized joint pains, which has previously been responsive to prednisone  therapy. The use of albuterol  helps with her dyspnea. She has continued to follow with Dr. Tobie from [facility] and was planned to start Humera which has not yet been initiated.  OV  03/22/24:  Discussed the use of AI scribe software for clinical note transcription with the patient, who gave verbal consent to proceed.  She has been experiencing persistent congestion and cough for the past four weeks, initially presenting with rhinorrhea and sneezing, followed by a low-grade fever. Self-treatment with Mucinex  and Sudafed was ineffective, and antibiotics from urgent care did not alleviate the symptoms. The cough was severe, causing significant discomfort, especially during coughing spells that exacerbate her shoulder pain post-surgery.  She underwent shoulder replacement surgery in July. Following a severe coughing spell, she experienced increased shoulder pain and underwent an X-ray, which showed no new issues. A tapering dose of prednisone  provided some relief, but she has not regained her previous range of motion. The cough is improved, but hasn't fully resolved.  She uses an albuterol  inhaler as needed and a daily Stiolto inhaler, which provide some relief, but symptoms persist. She experiences wheezing, particularly when lying down, and describes the congestion as improving slowly but not resolved.  She has a history of interstitial lung disease with autoimmune features. A CT scan is scheduled in [place] to follow up on a pulmonary nodule.  She continues to follow with Dr. Tobie from rheumatology, and plans on starting Humira in January of 2026. She did meet with Dr. Geronimo for an initial consultation in July, and would prefer to get her pulmonary care locally in [facility] rather than have to drive to [place].       Patient has a history of Psoriatic arthritis followed by Dr. Tobie from [facility]. She was maintained on Simponi  Aria (Golimumab ), previously tolerating it without flares, but no longer does with worsening joint pains. She is being considered for humira therapy. She also has a history of CAD followed by cardiology   Patient has a long standing history  of smoking, and currently continues to smoke. Given this, she was enrolled in our lung cancer screening program. A LDCT was performed on 04/29/2023 showing findings concerning for interstitial lung disease as well as a right lower lobe nodule.   She currently smokes 1 pack a day. She has around 55 pack years of smoking history. Denies occupational exposures.  Ancillary information including prior medications, full medical/surgical/family/social histories, and PFTs (when available) are listed below and have been reviewed.    Review of Systems  Constitutional:  Negative for chills, fever and weight loss.  Respiratory:  Positive for cough, sputum production and shortness of breath. Negative for hemoptysis and wheezing.   Cardiovascular:  Negative for chest pain.     Objective:   Vitals:   03/22/24 1019  BP: 138/76  Pulse: 98  Temp: (!) 97.5 F (36.4 C)  SpO2: 99%  Weight: 116 lb 12.8 oz (53 kg)  Height: 4' 11 (1.499 m)   99% on RA BMI Readings from Last 3 Encounters:  03/22/24 23.59 kg/m  02/03/24 22.90 kg/m  01/22/24 23.01 kg/m   Wt Readings from Last 3 Encounters:  03/22/24 116 lb 12.8  oz (53 kg)  02/03/24 113 lb 6.4 oz (51.4 kg)  01/22/24 113 lb 14.4 oz (51.7 kg)    Physical Exam Constitutional:      Appearance: Normal appearance.  Cardiovascular:     Rate and Rhythm: Normal rate and regular rhythm.     Pulses: Normal pulses.     Heart sounds: Normal heart sounds.  Pulmonary:     Effort: Pulmonary effort is normal.     Breath sounds: Rales (bibasilar) present.  Neurological:     General: No focal deficit present.     Mental Status: She is alert and oriented to person, place, and time. Mental status is at baseline.       Ancillary Information    Past Medical History:  Diagnosis Date   Allergic rhinitis    Allergy    Annular oral lichen planus    Anxiety    Arthritis    Depression    Deviated nasal septum    nasal obstruction   Guaiac positive  stools    history   H. 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   History of palpitations    Hyperlipidemia    Hypertension    Migraine    Myocardial infarction (HCC)    Osteoarthritis    Osteoporosis    Panic disorder    Psoriasis    Psoriatic arthritis (HCC)    Rheumatoid arteritis (HCC)    Tinnitus      Family History  Problem Relation Age of Onset   Hypertension Mother    Hyperlipidemia Mother    Hypothyroidism Mother    Fibromyalgia Mother    Hearing loss Mother    Heart attack Mother    Lung cancer Father    Coronary artery disease Father    Hyperlipidemia Father    Hypertension Father    Heart attack Father    Hearing loss Sister    Hyperlipidemia Sister    Hypertension Sister    Fibromyalgia Sister    Hemochromatosis Sister    Hyperlipidemia Sister    Hypertension Sister    ADD / ADHD Brother    Breast cancer Neg Hx      Past Surgical History:  Procedure Laterality Date   ABDOMINAL HYSTERECTOMY  1983   Ovaries have been removed.   ABDOMINOPLASTY     APPENDECTOMY     BREAST BIOPSY Right    neg- core   BRONCHIAL BRUSHINGS  08/21/2023   Procedure: BRONCHOSCOPY, WITH BRUSH BIOPSY;  Surgeon: Malka Domino, MD;  Location: [facility];  Service: Pulmonary;;   BRONCHIAL NEEDLE ASPIRATION BIOPSY  08/21/2023   Procedure: BRONCHOSCOPY, WITH NEEDLE ASPIRATION BIOPSY;  Surgeon: Malka Domino, MD;  Location: [facility];  Service: Pulmonary;;   BRONCHIAL WASHINGS  08/21/2023   Procedure: IRRIGATION, BRONCHUS;  Surgeon: Malka Domino, MD;  Location: [facility];  Service: Pulmonary;;   CARDIAC CATHETERIZATION     COLONOSCOPY  03/2006   COLONOSCOPY WITH PROPOFOL  N/A 11/19/2017   Procedure: COLONOSCOPY WITH PROPOFOL ;  Surgeon: Toledo, Ladell POUR, MD;  Location: [facility] ENDOSCOPY;  Service: Gastroenterology;  Laterality: N/A;   CORONARY/GRAFT ACUTE MI REVASCULARIZATION N/A 02/24/2022   Procedure:  Coronary/Graft Acute MI Revascularization;  Surgeon: Darron Deatrice LABOR, MD;  Location: [facility] INVASIVE CV LAB;  Service: Cardiovascular;  Laterality: N/A;   ENDOBRONCHIAL ULTRASOUND Bilateral 08/21/2023   Procedure: ENDOBRONCHIAL ULTRASOUND (EBUS);  Surgeon: Malka Domino, MD;  Location: [facility] ENDOSCOPY;  Service: Pulmonary;  Laterality: Bilateral;   ENDOSCOPIC  CONCHA BULLOSA RESECTION Right 01/30/2021   Procedure: ENDOSCOPIC CONCHA BULLOSA RESECTION;  Surgeon: Blair Mt, MD;  Location: [facility];  Service: ENT;  Laterality: Right;   ESOPHAGOGASTRODUODENOSCOPY N/A 03/19/2022   Procedure: ESOPHAGOGASTRODUODENOSCOPY (EGD);  Surgeon: Maryruth Ole DASEN, MD;  Location: [facility] ENDOSCOPY;  Service: Gastroenterology;  Laterality: N/A;   LEFT HEART CATH AND CORONARY ANGIOGRAPHY N/A 02/24/2022   Procedure: LEFT HEART CATH AND CORONARY ANGIOGRAPHY;  Surgeon: Darron Deatrice LABOR, MD;  Location: [facility] INVASIVE CV LAB;  Service: Cardiovascular;  Laterality: N/A;   REVERSE SHOULDER ARTHROPLASTY Right 12/25/2023   RHINOPLASTY  2002   SEPTOPLASTY N/A 01/30/2021   Procedure: SEPTOPLASTY;  Surgeon: Blair Mt, MD;  Location: [facility];  Service: ENT;  Laterality: N/A;   UPPER GI ENDOSCOPY  2008    Social History   Socioeconomic History   Marital status: Married    Spouse name: Not on file   Number of children: 1   Years of education: Not on file   Highest education level: High school graduate  Occupational History   Occupation: retired  Tobacco Use   Smoking status: Every Day    Current packs/day: 1.00    Average packs/day: 1 pack/day for 54.8 years (54.8 ttl pk-yrs)    Types: Cigarettes    Start date: 1971   Smokeless tobacco: Never   Tobacco comments:    Smokes 1-1.5 PPD- khj 03/22/2024  Vaping Use   Vaping status: Never Used  Substance and Sexual Activity   Alcohol use: Not Currently   Drug use: No   Sexual activity: Not Currently    Birth control/protection:  Surgical  Other Topics Concern   Not on file  Social History Narrative   Not on file   Social Drivers of Health   Financial Resource Strain: Low Risk  (10/08/2023)   Overall Financial Resource Strain (CARDIA)    Difficulty of Paying Living Expenses: Not hard at all  Food Insecurity: No Food Insecurity (10/08/2023)   Hunger Vital Sign    Worried About Running Out of Food in the Last Year: Never true    Ran Out of Food in the Last Year: Never true  Transportation Needs: No Transportation Needs (10/08/2023)   PRAPARE - Administrator, Civil Service (Medical): No    Lack of Transportation (Non-Medical): No  Physical Activity: Inactive (10/08/2023)   Exercise Vital Sign    Days of Exercise per Week: 0 days    Minutes of Exercise per Session: 0 min  Stress: No Stress Concern Present (10/08/2023)   [employer] of Occupational Health - Occupational Stress Questionnaire    Feeling of Stress : Only a little  Social Connections: Moderately Integrated (10/08/2023)   Social Connection and Isolation Panel    Frequency of Communication with Friends and Family: More than three times a week    Frequency of Social Gatherings with Friends and Family: Never    Attends Religious Services: More than 4 times per year    Active Member of [employer] or Organizations: No    Attends Banker Meetings: Never    Marital Status: Married  Catering manager Violence: Not At Risk (10/08/2023)   Humiliation, Afraid, Rape, and Kick questionnaire    Fear of Current or Ex-Partner: No    Emotionally Abused: No    Physically Abused: No    Sexually Abused: No     Allergies  Allergen Reactions   Enbrel [Etanercept] Hives    Other reaction(s): Muscle Cramps  Modafinil Rash   Augmentin  [Amoxicillin-Pot Clavulanate] Nausea And Vomiting    as stated (XR).   Cefdinir  Diarrhea and Other (See Comments)    Other reaction(s): GI intolerance, Other (see comments) Gives her Cdiff Gives her Cdiff     Lisinopril Cough   Macrobid  [Nitrofurantoin ] Hives     CBC    Component Value Date/Time   WBC 12.1 (H) 01/22/2024 1635   WBC 13.7 (H) 07/30/2023 1029   WBC 11.0 (H) 07/31/2022 1112   RBC 4.64 01/22/2024 1635   RBC 4.73 07/30/2023 1029   HGB 14.9 01/22/2024 1635   HCT 45.9 01/22/2024 1635   PLT 385 01/22/2024 1635   MCV 99 (H) 01/22/2024 1635   MCV 98 10/18/2013 1529   MCH 32.1 01/22/2024 1635   MCH 32.1 07/30/2023 1029   MCHC 32.5 01/22/2024 1635   MCHC 32.1 07/30/2023 1029   RDW 12.5 01/22/2024 1635   RDW 12.3 10/18/2013 1529   LYMPHSABS 1.1 01/22/2024 1635   LYMPHSABS 2.8 10/18/2013 1529   MONOABS 0.7 07/30/2023 1029   MONOABS 0.7 10/18/2013 1529   EOSABS 0.1 01/22/2024 1635   EOSABS 0.2 10/18/2013 1529   BASOSABS 0.1 01/22/2024 1635   BASOSABS 0.2 (H) 10/18/2013 1529    Pulmonary Functions Testing Results:    Latest Ref Rng & Units 08/26/2023    4:16 PM  PFT Results  FVC-Pre L 1.60   FVC-Predicted Pre % 67   FVC-Post L 1.76   FVC-Predicted Post % 74   Pre FEV1/FVC % % 76   Post FEV1/FCV % % 78   FEV1-Pre L 1.22   FEV1-Predicted Pre % 68   FEV1-Post L 1.37   DLCO uncorrected ml/min/mmHg 13.68   DLCO UNC% % 84   DLVA Predicted % 91   TLC L 4.40   TLC % Predicted % 102   RV % Predicted % 131     Outpatient Medications Prior to Visit  Medication Sig Dispense Refill   albuterol  (VENTOLIN  HFA) 108 (90 Base) MCG/ACT inhaler Inhale 2 puffs into the lungs every 6 (six) hours as needed for wheezing or shortness of breath. 8 g 2   amLODipine  (NORVASC ) 5 MG tablet TAKE 1 TABLET BY MOUTH DAILY 90 tablet 1   azelastine (ASTELIN) 0.1 % nasal spray Place 1 spray into both nostrils daily as needed.     Bempedoic Acid-Ezetimibe  (NEXLIZET ) 180-10 MG TABS Take 1 tablet by mouth daily. 30 tablet 3   clobetasol  ointment (TEMOVATE ) 0.05 % APP TOPICALLY TO GLUTEAL AREA ONCE D PRF DERMATITIS 30 g 1   escitalopram  (LEXAPRO ) 20 MG tablet TAKE ONE TABLET BY MOUTH ONCE DAILY 30  tablet 0   fluticasone  (FLONASE ) 50 MCG/ACT nasal spray Place 2 sprays into both nostrils as needed for allergies or rhinitis.     gabapentin  (NEURONTIN ) 100 MG capsule Take 100 mg by mouth 2 (two) times daily as needed.     gabapentin  (NEURONTIN ) 300 MG capsule Take 300 mg by mouth as needed (qhs). PRN     loratadine (CLARITIN) 10 MG tablet Take 10 mg by mouth daily.     losartan  (COZAAR ) 25 MG tablet TAKE 1 TABLET BY MOUTH DAILY 90 tablet 1   methocarbamol (ROBAXIN) 750 MG tablet Take 750 mg by mouth every 8 (eight) hours as needed for muscle spasms.     nitroGLYCERIN  (NITROSTAT ) 0.4 MG SL tablet Place 1 tablet (0.4 mg total) under the tongue every 5 (five) minutes as needed for chest pain. 30 tablet  12   ondansetron  (ZOFRAN -ODT) 4 MG disintegrating tablet Take 1 tablet (4 mg total) by mouth every 8 (eight) hours as needed for nausea or vomiting. 20 tablet 0   oxyCODONE -acetaminophen  (PERCOCET) 10-325 MG tablet Take 1 tablet by mouth every 4 (four) hours as needed for pain.     potassium chloride  (KLOR-CON  M) 10 MEQ tablet TAKE TWO TABLETS BY MOUTH EVERY DAY 60 tablet 1   predniSONE  (DELTASONE ) 10 MG tablet Take 10 mg by mouth daily.     traZODone  (DESYREL ) 100 MG tablet TAKE 0.5-1 TABLET BY MOUTH AT BEDTIME. 30 tablet 5   Tiotropium Bromide-Olodaterol (STIOLTO RESPIMAT ) 2.5-2.5 MCG/ACT AERS Inhale 2 puffs into the lungs daily. 60 each 12   Facility-Administered Medications Prior to Visit  Medication Dose Route Frequency Provider Last Rate Last Admin   albuterol  (PROVENTIL ) (2.5 MG/3ML) 0.083% nebulizer solution 2.5 mg  2.5 mg Nebulization Once Gennie Eisinger, MD

## 2024-03-24 DIAGNOSIS — Z96611 Presence of right artificial shoulder joint: Secondary | ICD-10-CM | POA: Diagnosis not present

## 2024-03-29 DIAGNOSIS — K219 Gastro-esophageal reflux disease without esophagitis: Secondary | ICD-10-CM | POA: Diagnosis not present

## 2024-03-29 DIAGNOSIS — K224 Dyskinesia of esophagus: Secondary | ICD-10-CM | POA: Diagnosis not present

## 2024-03-29 DIAGNOSIS — K635 Polyp of colon: Secondary | ICD-10-CM | POA: Diagnosis not present

## 2024-03-29 DIAGNOSIS — K5909 Other constipation: Secondary | ICD-10-CM | POA: Diagnosis not present

## 2024-04-06 DIAGNOSIS — Z96611 Presence of right artificial shoulder joint: Secondary | ICD-10-CM | POA: Diagnosis not present

## 2024-04-08 ENCOUNTER — Other Ambulatory Visit: Payer: Self-pay | Admitting: Family Medicine

## 2024-04-08 DIAGNOSIS — F410 Panic disorder [episodic paroxysmal anxiety] without agoraphobia: Secondary | ICD-10-CM

## 2024-04-09 ENCOUNTER — Other Ambulatory Visit: Payer: Self-pay | Admitting: Orthopedic Surgery

## 2024-04-09 DIAGNOSIS — Z96611 Presence of right artificial shoulder joint: Secondary | ICD-10-CM | POA: Diagnosis not present

## 2024-04-14 ENCOUNTER — Ambulatory Visit
Admission: RE | Admit: 2024-04-14 | Discharge: 2024-04-14 | Disposition: A | Discharge status: Home / Self Care | Source: Ambulatory Visit | Attending: Student in an Organized Health Care Education/Training Program | Admitting: Student in an Organized Health Care Education/Training Program

## 2024-04-14 DIAGNOSIS — R911 Solitary pulmonary nodule: Secondary | ICD-10-CM | POA: Insufficient documentation

## 2024-04-14 DIAGNOSIS — J432 Centrilobular emphysema: Secondary | ICD-10-CM | POA: Diagnosis not present

## 2024-04-14 DIAGNOSIS — Z96611 Presence of right artificial shoulder joint: Secondary | ICD-10-CM | POA: Diagnosis not present

## 2024-04-14 DIAGNOSIS — S42121D Displaced fracture of acromial process, right shoulder, subsequent encounter for fracture with routine healing: Secondary | ICD-10-CM | POA: Diagnosis not present

## 2024-04-14 NOTE — Progress Notes (Signed)
 April Best                                          MRN: [id number]   04/14/2024   The [facility] Quality Team Specialist reviewed this patient medical record for the purposes of chart review for care gap closure. The following were reviewed: chart review for care gap closure-controlling blood pressure.    [facility] Quality Team

## 2024-04-16 ENCOUNTER — Telehealth: Payer: Self-pay

## 2024-04-16 ENCOUNTER — Ambulatory Visit: Payer: Self-pay | Admitting: Student in an Organized Health Care Education/Training Program

## 2024-04-16 DIAGNOSIS — R160 Hepatomegaly, not elsewhere classified: Secondary | ICD-10-CM

## 2024-04-16 NOTE — Telephone Encounter (Signed)
 [facility] called with a call report on her CT Chest done on 04/14/2024.

## 2024-04-19 ENCOUNTER — Ambulatory Visit

## 2024-04-19 DIAGNOSIS — J849 Interstitial pulmonary disease, unspecified: Secondary | ICD-10-CM

## 2024-04-19 DIAGNOSIS — J449 Chronic obstructive pulmonary disease, unspecified: Secondary | ICD-10-CM | POA: Diagnosis not present

## 2024-04-19 LAB — PULMONARY FUNCTION TEST
DL/VA % pred: 116 %
DL/VA: 5.04 ml/min/mmHg/L
DLCO unc % pred: 98 %
DLCO unc: 15.79 ml/min/mmHg
FEF 25-75 Post: 1.51 L/s
FEF 25-75 Pre: 1.15 L/s
FEF2575-%Change-Post: 31 %
FEF2575-%Pred-Post: 99 %
FEF2575-%Pred-Pre: 75 %
FEV1-%Change-Post: 8 %
FEV1-%Pred-Post: 84 %
FEV1-%Pred-Pre: 77 %
FEV1-Post: 1.48 L
FEV1-Pre: 1.36 L
FEV1FVC-%Change-Post: 2 %
FEV1FVC-%Pred-Pre: 99 %
FEV6-%Change-Post: 6 %
FEV6-%Pred-Post: 85 %
FEV6-%Pred-Pre: 80 %
FEV6-Post: 1.9 L
FEV6-Pre: 1.78 L
FEV6FVC-%Pred-Post: 105 %
FEV6FVC-%Pred-Pre: 105 %
FVC-%Change-Post: 5 %
FVC-%Pred-Post: 81 %
FVC-%Pred-Pre: 77 %
FVC-Post: 1.91 L
FVC-Pre: 1.81 L
Post FEV1/FVC ratio: 77 %
Post FEV6/FVC ratio: 100 %
Pre FEV1/FVC ratio: 75 %
Pre FEV6/FVC Ratio: 100 %
RV % pred: 147 %
RV: 2.91 L
TLC % pred: 110 %
TLC: 4.77 L

## 2024-04-19 NOTE — Progress Notes (Signed)
 Full PFT completed today ? ?

## 2024-04-19 NOTE — Telephone Encounter (Signed)
 Per result note from Dr. Isadora,    Belva Isadora, MD 04/16/2024  1:58 PM EDT     Discussed results of the CT with the patient over the phone. Will refer to IR for a liver biopsy, and decide on lung biopsy depending on these findings.     Nothing further needed.

## 2024-04-19 NOTE — Patient Instructions (Signed)
 Full PFT completed today ? ?

## 2024-04-20 NOTE — Progress Notes (Signed)
 Jenna Cordella LABOR, MD sent to [facility] PROCEDURE / BIOPSY REVIEW Date: 04/20/24  Requested Biopsy site: liver Reason for request: liver masses Imaging review: Best seen on CT chest  Decision: Approved Imaging modality to perform: Ultrasound Schedule with: Moderate Sedation Schedule for: Any VIR  Additional comments: @Schedulers .  Please contact me with questions, concerns, or if issue pertaining to this request arise.  Cordella LABOR Jenna, MD [facility]

## 2024-04-20 NOTE — Progress Notes (Signed)
 Luverne Aran, MD sent to [facility] for US  guided liver lesion biopsy w/ sedation; any IR.  GY

## 2024-04-26 ENCOUNTER — Encounter: Payer: Self-pay | Admitting: Student in an Organized Health Care Education/Training Program

## 2024-04-26 ENCOUNTER — Ambulatory Visit: Payer: Self-pay

## 2024-04-26 NOTE — Telephone Encounter (Signed)
 FYI Only or Action Required?: Action required by provider: request for appointment.  Patient is followed in Pulmonology for Pulmonary emphysema April Best) , last seen on 03/22/2024 by April Hose, MD.  Called Nurse Triage reporting Shortness of Breath.  Symptoms began worsening last week.  Interventions attempted: Nothing.  Symptoms are: worsening x week.  Triage Disposition: See PCP When Office is Open (Within 3 Days)  Patient/caregiver understands and will follow disposition?:  Copied from CRM #[id number]. Topic: Clinical - Red Word Triage >> Apr 26, 2024  9:28 AM Ismael A wrote: Red Word that prompted transfer to Nurse Triage: states she is very bloated and having trouble breathing, states she feels pressure Reason for Disposition  [1] MODERATE longstanding difficulty breathing (e.g., speaks in phrases, SOB even at rest, pulse 100-120) AND [2] SAME as normal    No openings available in office - sent message in chat to office requesting appt for patient.  Answer Assessment - Initial Assessment Questions 1. RESPIRATORY STATUS: Describe your breathing? (e.g., wheezing, shortness of breath, unable to speak, severe coughing)      SOB at times and worsening with sitting and laying, wheezing and coughing-brown 2. ONSET: When did this breathing problem begin?      Worsening x week 3. PATTERN Does the difficult breathing come and go, or has it been constant since it started?      Constant 4. SEVERITY: How bad is your breathing? (e.g., mild, moderate, severe)      moderate 5. RECURRENT SYMPTOM: Have you had difficulty breathing before? If Yes, ask: When was the last time? and What happened that time?      na 6. CARDIAC HISTORY: Do you have any history of heart disease? (e.g., heart attack, angina, bypass surgery, angioplasty)      na 7. LUNG HISTORY: Do you have any history of lung disease?  (e.g., pulmonary embolus, asthma, emphysema)     Pulmonary emphysema (HCC) 8.  CAUSE: What do you think is causing the breathing problem?      unsure 9. OTHER SYMPTOMS: Do you have any other symptoms? (e.g., chest pain, cough, dizziness, fever, runny nose)     Cough, nausea 10. O2 SATURATION MONITOR:  Do you use an oxygen saturation monitor (pulse oximeter) at home? If Yes, ask: What is your reading (oxygen level) today? What is your usual oxygen saturation reading? (e.g., 95%)       no 11. PREGNANCY: Is there any chance you are pregnant? When was your last menstrual period?       na 12. TRAVEL: Have you traveled out of the country in the last month? (e.g., travel history, exposures)       na Stage VI liver cancer Abd distended/abd bloating - causing pressure and difficulty to breath especially  Protocols used: Breathing Difficulty-A-AH

## 2024-04-28 NOTE — Telephone Encounter (Signed)
 Pt called back as she had not heard back from the office (was routed incorrectly and not re-routed).  States no improvement in abdominal distention. Recently dx with liver nodule, previously dx with lung nodule. Has liver biopsy scheduled.  This RN recommended scheduling patient with PCP office today, she refused x 2 attempts - would only like to see her pulmonology provider. No available appointments. Advised pt that this RN would reach out to the office for further advisement.  Patient made aware that the ED would also be an appropriate option and that it would be advisable if her distention worsens or if she develops abdominal pain, nausea, vomiting, fever or SOB above her baseline. Pt verbalized understanding.

## 2024-04-29 ENCOUNTER — Ambulatory Visit

## 2024-04-29 NOTE — Telephone Encounter (Signed)
 See patient message from 11/10. I have spoke with the patient.   Nothing further needed.

## 2024-04-29 NOTE — Telephone Encounter (Signed)
 Per secure chat with Dr. Isadora - I do not know what to do about bloating.  Per Dr. Clydene message in the telephone encounter from 04/26/2024- I'm in ICU all week, can see her next Wednesday at the earliest.    I spoke notified the patient. I asked if she had reached out to her PCP and she said she did not want to bring them in. I offered her an appt with Dr. Isadora on 11/19 and she said she already has an appt with him in Nov. I told her to call back if she needs anything else.  Nothing further needed.

## 2024-04-30 NOTE — Progress Notes (Signed)
 Patient for US  guided Liver Biopsy on Mon 05/03/24, I called and spoke with the patient on the phone and gave pre-procedure instructions. Pt was made aware to be here at 10a, NPO after MN prior to procedure as well as driver post procedure/recovery/discharge. Pt stated understanding.  Called 04/30/24

## 2024-05-02 ENCOUNTER — Other Ambulatory Visit: Payer: Self-pay | Admitting: Radiology

## 2024-05-02 DIAGNOSIS — K769 Liver disease, unspecified: Secondary | ICD-10-CM

## 2024-05-03 ENCOUNTER — Other Ambulatory Visit: Payer: Self-pay

## 2024-05-03 ENCOUNTER — Ambulatory Visit
Admission: RE | Admit: 2024-05-03 | Discharge: 2024-05-03 | Disposition: A | Discharge status: Home / Self Care | Source: Ambulatory Visit | Attending: Student in an Organized Health Care Education/Training Program | Admitting: Student in an Organized Health Care Education/Training Program

## 2024-05-03 ENCOUNTER — Other Ambulatory Visit (HOSPITAL_COMMUNITY): Payer: Self-pay | Admitting: Interventional Radiology

## 2024-05-03 DIAGNOSIS — J849 Interstitial pulmonary disease, unspecified: Secondary | ICD-10-CM | POA: Insufficient documentation

## 2024-05-03 DIAGNOSIS — F17210 Nicotine dependence, cigarettes, uncomplicated: Secondary | ICD-10-CM | POA: Insufficient documentation

## 2024-05-03 DIAGNOSIS — R160 Hepatomegaly, not elsewhere classified: Secondary | ICD-10-CM | POA: Diagnosis not present

## 2024-05-03 DIAGNOSIS — M069 Rheumatoid arthritis, unspecified: Secondary | ICD-10-CM | POA: Insufficient documentation

## 2024-05-03 DIAGNOSIS — K769 Liver disease, unspecified: Secondary | ICD-10-CM | POA: Diagnosis not present

## 2024-05-03 DIAGNOSIS — L4050 Arthropathic psoriasis, unspecified: Secondary | ICD-10-CM | POA: Insufficient documentation

## 2024-05-03 DIAGNOSIS — C787 Secondary malignant neoplasm of liver and intrahepatic bile duct: Secondary | ICD-10-CM | POA: Diagnosis not present

## 2024-05-03 DIAGNOSIS — C801 Malignant (primary) neoplasm, unspecified: Secondary | ICD-10-CM | POA: Diagnosis not present

## 2024-05-03 DIAGNOSIS — Z01812 Encounter for preprocedural laboratory examination: Secondary | ICD-10-CM | POA: Insufficient documentation

## 2024-05-03 LAB — CBC
HCT: 44.1 % (ref 36.0–46.0)
Hemoglobin: 14.3 g/dL (ref 12.0–15.0)
MCH: 32.1 pg (ref 26.0–34.0)
MCHC: 32.4 g/dL (ref 30.0–36.0)
MCV: 98.9 fL (ref 80.0–100.0)
Platelets: 235 K/uL (ref 150–400)
RBC: 4.46 MIL/uL (ref 3.87–5.11)
RDW: 13.8 % (ref 11.5–15.5)
WBC: 20.9 K/uL — ABNORMAL HIGH (ref 4.0–10.5)
nRBC: 0 % (ref 0.0–0.2)

## 2024-05-03 LAB — PROTIME-INR
INR: 1 (ref 0.8–1.2)
Prothrombin Time: 13.4 s (ref 11.4–15.2)

## 2024-05-03 MED ORDER — FENTANYL CITRATE (PF) 100 MCG/2ML IJ SOLN
INTRAMUSCULAR | Status: AC | PRN
  Administered 2024-05-03: 50 ug via INTRAVENOUS

## 2024-05-03 MED ORDER — SODIUM CHLORIDE 0.9 % IV SOLN: INTRAVENOUS | Status: DC

## 2024-05-03 MED ORDER — MIDAZOLAM HCL 5 MG/5ML IJ SOLN
INTRAMUSCULAR | Status: AC | PRN
  Administered 2024-05-03: 1 mg via INTRAVENOUS

## 2024-05-03 MED ORDER — FENTANYL CITRATE (PF) 100 MCG/2ML IJ SOLN
INTRAMUSCULAR | Status: AC
  Filled 2024-05-03: qty 2

## 2024-05-03 MED ORDER — LIDOCAINE HCL (PF) 1 % IJ SOLN
10.0000 mL | Freq: Once | INTRAMUSCULAR | Status: AC
  Administered 2024-05-03: 10 mL via INTRADERMAL

## 2024-05-03 MED ORDER — MIDAZOLAM HCL 2 MG/2ML IJ SOLN
INTRAMUSCULAR | Status: AC
  Filled 2024-05-03: qty 2

## 2024-05-03 NOTE — Progress Notes (Signed)
 Patient clinically stable post US  Liver biopsy per DR Vanice, tolerated well. Vitals stable pre and post procedure. Noting patient's oxygen sats pre procedure 88--90 room air with congested cough, thus on 2 l/min oxygen and will monitor post procedure. Awake/alert and oriented at this time. Received Versed  1 mg along with Fentanyl  50 mcg IV for procedure. Report given to Paige Johnson Rn/ Joni Boyd Rn post procedure/specials/19

## 2024-05-03 NOTE — Progress Notes (Signed)
 Patient awake and taking in po fluids without complaints, discharge instructions given with questions answered. Biopsy site unremarkable. Denies complaints at this time.

## 2024-05-03 NOTE — Procedures (Signed)
 Interventional Radiology Procedure Note  Procedure: US  CORE BX LEFT LIVER MET    Complications: None  Estimated Blood Loss:  MIN  Findings: 85 G CORE X 2    M. FREDERIC SPECKING, MD

## 2024-05-03 NOTE — H&P (Signed)
 Chief Complaint:  Liver mass  Procedure: Liver lesion biopsy  Referring Provider(s): Dr. MARLA November  Supervising Physician: Vanice Revel  Patient Status: [facility] - Out-pt  History of Present Illness: April Best is a 73 y.o. female with a history of smoking, psoriatic arthritis,  rheumatoid arthritis, and interstitial lung disease who has been following with Dr. November for interstitial lung disease. In January, patient had a low dose CT screening scan that revealed a suspicious nodule in the right lower lobe. Workup for this was ultimately negative for malignancy; however, patient has continued to undergo repeat CT chest. Her most recent follow up scan was on 10/29 again revealed an enlarging right lower lobe nodule, likely a malignancy despite prior negative biopsy. Also with, Multiple new hypodense liver masses consistent with metastatic disease. Given new liver lesions, patient was referred to IR for biopsy for additional workup.  She presents today with family at the bedside. States that her abdomen has been distended for about the past 2 months, but for the past 2-3 weeks it has become increasingly uncomfortable, particularly in the upper area. Abdominal distention occasionally makes it harder for her to breathe, particularly when lying flat. She denies any chest pain, fevers/chills, nausea/vomiting, changes in appetite, changes in weight, or changes in bowel/bladder habits. NPO since midnight. All questions and concerns answered at the bedside.   Patient is Full Code  Past Medical History:  Diagnosis Date   Allergic rhinitis    Allergy    Annular oral lichen planus    Anxiety    Arthritis    Depression    Deviated nasal septum    nasal obstruction   Guaiac positive stools    history   H. 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   ABDOMINOPLASTY     APPENDECTOMY     BREAST BIOPSY Right    neg- core   BRONCHIAL BRUSHINGS  08/21/2023   Procedure: BRONCHOSCOPY, WITH BRUSH BIOPSY;  Surgeon: Malka Domino, MD;  Location: [facility];  Service: Pulmonary;;   BRONCHIAL NEEDLE ASPIRATION BIOPSY  08/21/2023   Procedure: BRONCHOSCOPY, WITH NEEDLE ASPIRATION BIOPSY;  Surgeon: Malka Domino, MD;  Location: [facility];  Service: Pulmonary;;   BRONCHIAL WASHINGS  08/21/2023   Procedure: IRRIGATION, BRONCHUS;  Surgeon: Malka Domino, MD;  Location: [facility];  Service: Pulmonary;;   CARDIAC CATHETERIZATION     COLONOSCOPY  03/2006   COLONOSCOPY WITH PROPOFOL  N/A 11/19/2017   Procedure: COLONOSCOPY WITH PROPOFOL ;  Surgeon: Toledo, Ladell MARLA, MD;  Location: [facility] ENDOSCOPY;  Service: Gastroenterology;  Laterality: N/A;   CORONARY/GRAFT ACUTE MI REVASCULARIZATION N/A 02/24/2022   Procedure: Coronary/Graft Acute MI Revascularization;  Surgeon: Darron Deatrice LABOR, MD;  Location: [facility] INVASIVE CV [facility];  Service: Cardiovascular;  Laterality: N/A;   ENDOBRONCHIAL ULTRASOUND Bilateral 08/21/2023   Procedure: ENDOBRONCHIAL ULTRASOUND (EBUS);  Surgeon: Malka Domino, MD;  Location: [facility] ENDOSCOPY;  Service: Pulmonary;  Laterality: Bilateral;   ENDOSCOPIC CONCHA BULLOSA RESECTION Right 01/30/2021   Procedure: ENDOSCOPIC CONCHA  BULLOSA RESECTION;  Surgeon: Blair Mt, MD;  Location: [facility];  Service: ENT;  Laterality: Right;   ESOPHAGOGASTRODUODENOSCOPY N/A 03/19/2022   Procedure: ESOPHAGOGASTRODUODENOSCOPY (EGD);  Surgeon: Maryruth Ole DASEN, MD;  Location: [facility] ENDOSCOPY;  Service: Gastroenterology;  Laterality: N/A;   LEFT HEART CATH AND  CORONARY ANGIOGRAPHY N/A 02/24/2022   Procedure: LEFT HEART CATH AND CORONARY ANGIOGRAPHY;  Surgeon: Darron Deatrice LABOR, MD;  Location: [facility] INVASIVE CV LAB;  Service: Cardiovascular;  Laterality: N/A;   REVERSE SHOULDER ARTHROPLASTY Right 12/25/2023   RHINOPLASTY  2002   SEPTOPLASTY N/A 01/30/2021   Procedure: SEPTOPLASTY;  Surgeon: Blair Mt, MD;  Location: [facility];  Service: ENT;  Laterality: N/A;   UPPER GI ENDOSCOPY  2008    Allergies: Enbrel [etanercept], Modafinil, Augmentin  [amoxicillin-pot clavulanate], Cefdinir , Lisinopril, and Macrobid  [nitrofurantoin ]  Medications: Prior to Admission medications   Medication Sig Start Date End Date Taking? Authorizing Provider  albuterol  (VENTOLIN  HFA) 108 (90 Base) MCG/ACT inhaler Inhale 2 puffs into the lungs every 6 (six) hours as needed for wheezing or shortness of breath. 10/28/23  Yes Dgayli, Belva, MD  amLODipine  (NORVASC ) 5 MG tablet TAKE 1 TABLET BY MOUTH DAILY 01/01/23  Yes Bacigalupo, Angela M, MD  Bempedoic Acid-Ezetimibe  (NEXLIZET ) 180-10 MG TABS Take 1 tablet by mouth daily. 04/28/23  Yes Darliss Rogue, MD  escitalopram  (LEXAPRO ) 20 MG tablet TAKE ONE TABLET BY MOUTH ONCE DAILY 04/09/24  Yes Bacigalupo, Angela M, MD  Fluticasone -Umeclidin-Vilant (TRELEGY ELLIPTA) 200-62.5-25 MCG/ACT AEPB Inhale 1 puff into the lungs daily. 03/22/24  Yes Dgayli, Belva, MD  gabapentin  (NEURONTIN ) 300 MG capsule Take 300 mg by mouth as needed (qhs). PRN   Yes [provider]  losartan  (COZAAR ) 25 MG tablet TAKE 1 TABLET BY MOUTH DAILY 11/28/22  Yes Emilio Marseille T, FNP  ondansetron  (ZOFRAN -ODT) 4 MG disintegrating tablet Take 1 tablet (4 mg total) by mouth every 8 (eight) hours as needed for nausea or vomiting. 07/24/22  Yes Gladis Elsie BROCKS, PA-C  oxyCODONE -acetaminophen  (PERCOCET) 10-325 MG tablet Take 1 tablet by mouth every 4 (four) hours as needed for pain.   Yes [provider]  potassium chloride  (KLOR-CON  M) 10  MEQ tablet TAKE TWO TABLETS BY MOUTH EVERY DAY 06/24/23  Yes Bacigalupo, Jon HERO, MD  predniSONE  (DELTASONE ) 10 MG tablet Take 10 mg by mouth daily. 10/25/23  Yes [provider]  traZODone  (DESYREL ) 100 MG tablet TAKE 0.5-1 TABLET BY MOUTH AT BEDTIME. 10/21/22  Yes Simmons-Robinson, Makiera, MD  azelastine (ASTELIN) 0.1 % nasal spray Place 1 spray into both nostrils daily as needed.    [provider]  clobetasol  ointment (TEMOVATE ) 0.05 % APPLY TOPICALLY TO GLUTEAL AREA ONCE DAILY FOR DERMATITIS 03/23/24   Myrla Jon HERO, MD  fluticasone  (FLONASE ) 50 MCG/ACT nasal spray Place 2 sprays into both nostrils as needed for allergies or rhinitis. 05/07/21   [provider]  gabapentin  (NEURONTIN ) 100 MG capsule Take 100 mg by mouth 2 (two) times daily as needed. 11/05/19   [provider]  loratadine (CLARITIN) 10 MG tablet Take 10 mg by mouth daily.    [provider]  methocarbamol (ROBAXIN) 750 MG tablet Take 750 mg by mouth every 8 (eight) hours as needed for muscle spasms.    [provider]  nitroGLYCERIN  (NITROSTAT ) 0.4 MG SL tablet Place 1 tablet (0.4 mg total) under the tongue every 5 (five) minutes as needed for chest pain. 02/26/22   Patel, Sona, MD     Family History  Problem Relation Age of Onset   Hypertension Mother    Hyperlipidemia Mother    Hypothyroidism Mother    Fibromyalgia Mother    Hearing loss Mother    Heart attack Mother    Lung cancer Father    Coronary artery disease Father    Hyperlipidemia Father    Hypertension Father    Heart attack Father    Hearing loss Sister    Hyperlipidemia Sister    Hypertension Sister    Fibromyalgia Sister    Hemochromatosis Sister    Hyperlipidemia Sister    Hypertension Sister    ADD / ADHD Brother    Breast cancer Neg Hx     Social History   Socioeconomic History   Marital status: Married    Spouse name: Not on file   Number of children: 1   Years of education: Not  on file   Highest education level: High school graduate  Occupational History   Occupation: retired  Tobacco Use   Smoking status: Every Day    Current packs/day: 1.00    Average packs/day: 1 pack/day for 54.9 years (54.9 ttl pk-yrs)    Types: Cigarettes    Start date: 1971   Smokeless tobacco: Never   Tobacco comments:    Smokes 1-1.5 PPD- khj 03/22/2024  Vaping Use   Vaping status: Never Used  Substance and Sexual Activity   Alcohol use: Not Currently   Drug use: No   Sexual activity: Not Currently    Birth control/protection: Surgical  Other Topics Concern   Not on file  Social History Narrative   Not on file   Social Drivers of Health   Financial Resource Strain: Low Risk  (10/08/2023)   Overall Financial Resource Strain (CARDIA)    Difficulty of Paying Living Expenses: Not hard at all  Food Insecurity: No Food Insecurity (10/08/2023)   Hunger Vital Sign    Worried About Running Out of Food in the Last Year: Never true    Ran Out of Food in the Last Year: Never true  Transportation Needs: No Transportation Needs (10/08/2023)   PRAPARE - Administrator, Civil Service (Medical): No    Lack of Transportation (Non-Medical): No  Physical Activity: Inactive (10/08/2023)   Exercise Vital Sign    Days of Exercise per Week: 0 days    Minutes of Exercise per Session: 0 min  Stress: No Stress Concern Present (10/08/2023)   [employer] of Occupational Health - Occupational Stress Questionnaire    Feeling of Stress : Only a little  Social Connections: Moderately Integrated (10/08/2023)   Social Connection and Isolation Panel    Frequency of Communication with Friends and Family: More than three times a week    Frequency of Social Gatherings with Friends and Family: Never    Attends Religious Services: More than 4 times per year    Active Member of [employer] or Organizations: No    Attends Banker Meetings: Never    Marital Status: Married     Review  of Systems  Respiratory:  Positive for shortness of breath (secondary to abdominal distention).   Gastrointestinal:  Positive for abdominal distention and abdominal pain (upper).  Patient denies any headache, chest pain, N/V, or fever/chills. All other systems are negative.   Vital Signs: BP (!) 141/83   Pulse (!) 103   Temp 98.4 F (36.9 C)   Resp 20   Ht 4' 11 (1.499 m)   Wt 114 lb (51.7  kg)   SpO2 91%   BMI 23.03 kg/m    Physical Exam Vitals reviewed.  Constitutional:      Appearance: Normal appearance.  HENT:     Mouth/Throat:     Mouth: Mucous membranes are moist.     Pharynx: Oropharynx is clear.  Cardiovascular:     Rate and Rhythm: Regular rhythm. Tachycardia present.  Pulmonary:     Effort: Pulmonary effort is normal.  Abdominal:     General: Abdomen is flat. There is distension (slight, throughout).     Palpations: Abdomen is soft.     Tenderness: There is abdominal tenderness (epigastric).  Skin:    General: Skin is warm and dry.  Neurological:     Mental Status: She is alert and oriented to person, place, and time.  Psychiatric:        Behavior: Behavior normal.     Imaging: CT SHOULDER RIGHT WO CONTRAST Result Date: 04/15/2024 EXAM: CT RIGHT SHOULDER, WITHOUT IV CONTRAST 04/14/2024 11:07:34 AM TECHNIQUE: Axial images were acquired through the right shoulder without IV contrast. Reformatted images were reviewed. Automated exposure control, iterative reconstruction, and/or weight based adjustment of the mA/kV was utilized to reduce the radiation dose to as low as reasonably achievable. COMPARISON: None available. CLINICAL HISTORY: Right side shoulder replacement, coughing 3 weeks ago, shoulder seized up and has less mobility. FINDINGS: BONES: Bone mineralization is osteopenic. There is a mildly displaced subacute fracture of the midsection of the acromion process of the scapula. This does not appear to be a pathologic fracture through a bone lesion. Amorphous  healing callus does surround the fracture site, but the fracture is still patent with fracture distraction measured up to 4 mm. The distal fragment is displaced inferiorly about one-third of the width of the bone and mildly inferiorly angulated. The glenohumeral joint has been replaced with a reverse shoulder arthroplasty. The hardware is well seated without further evidence of fractures. JOINTS: [facility] joint is normal and intact. The glenohumeral joint has been replaced with a reverse shoulder arthroplasty. The hardware is well seated without further evidence of fractures nor focal pathologic process . SOFT TISSUES: Spray artifact limits assessment of the soft tissues at the level of the hardware. Regional soft tissues are generally unremarkable, as visualized, apart from mild atrophic changes in the rotator cuff muscles. LUNGS: The visualized right lung demonstrates emphysematous and chronic changes without acute process. IMPRESSION: 1. Mildly displaced subacute fracture of the mid acromion with amorphous healing callus and fracture gap up to 4 mm; distal fragment displaced inferiorly about one-third bone width with mild inferior angulation; no evidence of pathologic fracture. 2. Reverse shoulder arthroplasty with well-seated hardware and no additional fracture identified. 3. Mild rotator cuff muscle atrophy. 4. Emphysematous and chronic changes in the visualized right lung; see chest CT report same date. 5. . These results will be telephoned to the referring provider or the referring providers representative by professional radiology assistant (PRA) personnel, with communication documented in the [facility] dashboard. Electronically signed by: Francis Quam MD 04/15/2024 11:22 PM EDT RP Workstation: [id number]   CT CHEST WO CONTRAST Result Date: 04/15/2024 EXAM: CT CHEST WITHOUT CONTRAST 04/14/2024 11:07:34 AM TECHNIQUE: CT of the chest was performed without the administration of intravenous contrast. Multiplanar  reformatted images are provided for review. Automated exposure control, iterative reconstruction, and/or weight based adjustment of the mA/kV was utilized to reduce the radiation dose to as low as reasonably achievable. COMPARISON: CTA chest 10/27/2023, chest CTs without contrast 08/20/2023 and 07/15/2023,  and PET CT from 07/02/2023. CLINICAL HISTORY: Follow up of pulmonary nodule, lung nodule seen on prior CT, follow up of exam, asymptomatic, smoker for 50 years with one pack per day. At some point after a localizing chest CT on 08/20/2023, the patient underwent robotic assisted navigational bronchoscopy and biopsy of this nodule which returned results negative for malignancy. FINDINGS: MEDIASTINUM: Heart size is normal. There is a small pericardial effusion, slightly increased. There is calcification in the left main and LAD coronary artery. Patchy calcified plaques in the aorta and great vessels of the mediastinum but no aortic aneurysm. The pulmonary trunk is prominent but unchanged, measuring 3.3 cm, indicating arterial hypertension. The pulmonary veins are normal in caliber. The central airways are clear. LYMPH NODES: No mediastinal, hilar or axillary lymphadenopathy. LUNGS AND PLEURA: The right lower lobe pleural-based nodule of concern has steadily enlarged over the last few CTs. This is worrisome for malignancy with a false negative biopsy. Today, the nodule measures 2.3 x 1.2 cm on series 5 axial image 91. On 10/27/2023 this measured 1.3 x 1.0 cm, on 08/20/2023 this was 1.1 x 0.8 cm. There are mild centrilobular and paraseptal emphysematous changes in the upper lobes. There are scattered areas of subpleural reticulation and single layer honeycombing in the upper lobes, and bilateral posterior lower lobe subpleural reticulation with up to 3-layer underlying honeycombing. There are additional scattered linear scar-like opacities in both lung bases, and bilateral apical pleuroparenchymal scarring. No active  infiltrate or other abnormalities. No other lung nodules are seen. No pleural effusion or pneumothorax. SOFT TISSUES/BONES: There is osteopenia with degenerative changes of the spine, partially hemangiomatous replacement of the T6 and T9 vertebral bodies and no appreciable bone metastases. There is spray artifact from a right shoulder arthroplasty. UPPER ABDOMEN: Limited images of the upper abdomen demonstrate multiple hypodense liver masses have developed and are almost certainly due to metastases indicating stage 4 malignancy. The largest of these is in segment 8 and measures 5.2 x 4.0 cm. There is a mass in segment 5 measuring at least 3.6 cm on the last image, not fully seen. There are several masses in the left lobe, the largest measuring 3.3 cm on image 122 in segment 4a. IMPRESSION: 1. Enlarging right lower lobe pleural-based nodule measuring 2.3 x 1.2 cm, likely a malignancy despite prior negative biopsy. 2. Multiple new hypodense liver masses consistent with  metastatic disease. 3. Findings consistent with interstitial lung disease with subpleural reticulation and honeycombing but not clearly fitting the uip pattern . 4. Mild centrilobular and paraseptal emphysema. 5. . . These results will be telephoned to the referring provider or the referring providers representative by professional radiology assistant (PRA) personnel, with communication documented in the [facility] dashboard. Electronically signed by: Francis Quam MD 04/15/2024 11:08 PM EDT RP Workstation: [id number]    Labs:  CBC: Recent Labs    07/30/23 1029 01/22/24 1635 05/03/24 1101  WBC 13.7* 12.1* 20.9*  HGB 15.2* 14.9 14.3  HCT 47.4* 45.9 44.1  PLT 354 385 235    COAGS: Recent Labs    05/03/24 1101  INR 1.0    BMP: Recent Labs    08/21/23 0610 01/22/24 1635  NA 140 141  K 4.9 4.1  CL 106 99  CO2 24 24  GLUCOSE 113* 157*  BUN 38* 20  CALCIUM  9.0 10.6*  CREATININE 0.96 0.67  GFRNONAA >60  --     LIVER FUNCTION  TESTS: Recent Labs    01/22/24 1635  BILITOT 0.3  AST 27  ALT 31  ALKPHOS 80  PROT 6.8  ALBUMIN 4.8    TUMOR MARKERS: No results for input(s): AFPTM, CEA, CA199, CHROMGRNA in the last 8760 hours.  Assessment and Plan:  Liver lesions: April Best is a 73 y.o. female with a history of interstitial lung disease, smoking, RA, Psoriatic Arthritis, and RLL nodule concerning for malignancy with recent imaging notable for liver lesions. Previous lung biopsy was negative for malignancy; however, given enlarging nodule and new liver lesions concerning for metastases repeat biopsy of liver lesions requested. She presents to [facility] Interventional Radiology department for an image-guided liver lesion biopsy with Dr. ONEIDA Specking. Procedure to be performed under moderate sedation.  Risks and benefits of liver lesion was discussed with the patient and/or patient's family including, but not limited to bleeding, infection, damage to adjacent structures or low yield requiring additional tests.  All of the questions were answered and there is agreement to proceed.  Consent signed and in chart.   Thank you for allowing our service to participate in April Best 's care.    Electronically Signed: Glennon CHRISTELLA Bal, PA-C   05/03/2024, 11:38 AM     I spent a total of  40 Minutes in face to face in clinical consultation, greater than 50% of which was counseling/coordinating care for liver lesion biopsy.

## 2024-05-06 ENCOUNTER — Telehealth: Payer: Self-pay

## 2024-05-06 DIAGNOSIS — J9811 Atelectasis: Secondary | ICD-10-CM | POA: Diagnosis not present

## 2024-05-06 DIAGNOSIS — M7989 Other specified soft tissue disorders: Secondary | ICD-10-CM | POA: Diagnosis not present

## 2024-05-06 DIAGNOSIS — Z9221 Personal history of antineoplastic chemotherapy: Secondary | ICD-10-CM | POA: Diagnosis not present

## 2024-05-06 DIAGNOSIS — E870 Hyperosmolality and hypernatremia: Secondary | ICD-10-CM | POA: Diagnosis not present

## 2024-05-06 DIAGNOSIS — B348 Other viral infections of unspecified site: Secondary | ICD-10-CM | POA: Diagnosis not present

## 2024-05-06 DIAGNOSIS — D61810 Antineoplastic chemotherapy induced pancytopenia: Secondary | ICD-10-CM | POA: Diagnosis not present

## 2024-05-06 DIAGNOSIS — R0902 Hypoxemia: Secondary | ICD-10-CM | POA: Diagnosis not present

## 2024-05-06 DIAGNOSIS — R600 Localized edema: Secondary | ICD-10-CM | POA: Diagnosis not present

## 2024-05-06 DIAGNOSIS — K5730 Diverticulosis of large intestine without perforation or abscess without bleeding: Secondary | ICD-10-CM | POA: Diagnosis not present

## 2024-05-06 DIAGNOSIS — R0602 Shortness of breath: Secondary | ICD-10-CM | POA: Diagnosis not present

## 2024-05-06 DIAGNOSIS — L539 Erythematous condition, unspecified: Secondary | ICD-10-CM | POA: Diagnosis not present

## 2024-05-06 DIAGNOSIS — R4182 Altered mental status, unspecified: Secondary | ICD-10-CM | POA: Diagnosis not present

## 2024-05-06 DIAGNOSIS — R7402 Elevation of levels of lactic acid dehydrogenase (LDH): Secondary | ICD-10-CM | POA: Diagnosis not present

## 2024-05-06 DIAGNOSIS — I10 Essential (primary) hypertension: Secondary | ICD-10-CM | POA: Diagnosis not present

## 2024-05-06 DIAGNOSIS — K5900 Constipation, unspecified: Secondary | ICD-10-CM | POA: Diagnosis not present

## 2024-05-06 DIAGNOSIS — J90 Pleural effusion, not elsewhere classified: Secondary | ICD-10-CM | POA: Diagnosis not present

## 2024-05-06 DIAGNOSIS — R519 Headache, unspecified: Secondary | ICD-10-CM | POA: Diagnosis not present

## 2024-05-06 DIAGNOSIS — R160 Hepatomegaly, not elsewhere classified: Secondary | ICD-10-CM | POA: Diagnosis not present

## 2024-05-06 DIAGNOSIS — R918 Other nonspecific abnormal finding of lung field: Secondary | ICD-10-CM | POA: Diagnosis not present

## 2024-05-06 DIAGNOSIS — C787 Secondary malignant neoplasm of liver and intrahepatic bile duct: Secondary | ICD-10-CM | POA: Diagnosis not present

## 2024-05-06 DIAGNOSIS — R140 Abdominal distension (gaseous): Secondary | ICD-10-CM | POA: Diagnosis not present

## 2024-05-06 DIAGNOSIS — D72829 Elevated white blood cell count, unspecified: Secondary | ICD-10-CM | POA: Diagnosis not present

## 2024-05-06 DIAGNOSIS — E119 Type 2 diabetes mellitus without complications: Secondary | ICD-10-CM | POA: Diagnosis not present

## 2024-05-06 NOTE — Telephone Encounter (Signed)
 Copied from CRM [id number]. Topic: Clinical - Medical Advice >> May 06, 2024  9:28 AM Devaughn RAMAN wrote: Reason for CRM: Pt husband Velinda called and stated pt right ankle started swelling and her left ankle not much but they swelled and have since went down, Tim has concerns regarding pt's care and would like to know due to her current medical conditions is this something he should be concerned for.

## 2024-05-06 NOTE — Telephone Encounter (Signed)
 I have notified the patient. Nothing further needed.

## 2024-05-06 NOTE — H&P (Signed)
 ------------------------------------------------------------------------------- Attestation signed by Clem Jenkins Jansky, MD at 05/08/24 1320 I saw and evaluated the patient, participating in the key portions of the service on 11/22.  I reviewed the resident's note.  I agree with the resident's findings and plan.   Called pathology and unfortunately showed small cell. See progress note for updated details.     Jenkins Jansky Clem, MD  [facility] Assistant Professor of Hospital Medicine   -------------------------------------------------------------------------------  Internal Medicine (MEDM) History & Physical  Assessment & Plan:  April Best is a 73 y.o. female who is presenting to [facility] with Lower extremity edema, abdominal distension, and abdominal pain, in the setting of the following pertinent/contributing co-morbidities: HTN, HLD, chronic pain/fibromyalgia, COPD, and recent liver biopsy for suspected metastatic lung cancer.  Principal Problem:   Lower extremity edema Active Problems:   Chronic pain disorder   Depression with anxiety   Benign essential hypertension   Hypercholesteremia   Chronic obstructive pulmonary disease (CMS-HCC)   Rhinovirus infection   Abdominal pain   Leukocytosis   Elevated LFTs  Active Problems LE Edema Patient endorses worsening bilateral foot and ankle edema since recent liver biopsy on 11/17.  On exam patient has 2+ pitting edema in her right foot/ankle as well as 1+ pitting edema in her left foot.  No tenderness to palpation in either foot, ankle, or calf.  Given concomitant abdominal pain and distention as well as patient reported relatively acute onset of these symptoms after her biopsy, would consider increased intra-abdominal pressure/venous or lymphatic obstruction as a potential etiology for her lower extremity edema.  CTA obtained in ED 11/20 was without PE, and no appreciable calf tenderness on exam, however will obtain PVLs to  evaluate for possible thrombosis.  Relatively low suspicion for new heart failure at this time given lack of pulmonary edema on chest x-ray, crackles on exam, JVD, or patient reported orthopnea. No skin findings to suggest chronic venous stasis. Would consider hypoalbuminemia as possible etiology as well given albumin of 3. -PVLs -consider echocardiogram pending clinical course -CT abd/pelvis as below   Abdominal pain and distention  Recent Liver biopsy  Leukocytosis  Elevated LFTs Patient endorses worsening abdominal pain and distention since her liver biopsy on 11/17.  Denies nausea, decreased appetite, vomiting, diarrhea, dysuria, fevers, or chills.  Denies constipation.  Does endorse back pain, however reports that this is chronic and not worse than her baseline.  She is hemodynamically stable and without anemia, additionally does not have any back/flank bruising on exam making retroperitoneal bleed seem quite unlikely at this time.  Additionally liver biopsy was performed with VIR and there is no apparent reason for her to have had retroperitoneal trauma.  On exam she has bruising in the right upper quadrant at the site of her recent biopsy.  Her abdomen is firm, distended, and diffusely quite tender to even light palpation.  Given her leukocytosis with WBCs to 20.3 with left shift, tachycardia, and elevated lactate in conjunction with her abdominal symptoms, I do have some concern for intra-abdominal infection at this time.  It is possible that her leukocytosis and tachycardia may be related to her rhinovirus infection and/or a stress response from her recent biopsy, however this would not explain her significant abdominal pain and distention.  Labs also notable for total bilirubin 2.6, AST 74, ALT 221, alk phos 262.  Suspect these are related to her recent liver biopsy as well as known hypoattenuating hepatic masses. If other more acute etiologies are ruled out,  would consider that her symptoms may  be related to progression of suspected metastatic cancer.  -CT abd/pelvis with contrast -s/p Vanc and cefepime in ED 11/20, transition to Vanc/Zosyn -follow up blood cultures obtained 11/20 -daily CBC, CMP  Rhinovirus RPP positive for rhinovirus in ED 11/20.  Patient denies shortness of breath, cough, congestion, fevers, chills, or chest tightness.  Placed on 2L [place] due to SpO2 93%, however patient has COPD and therefore should have goal 88-92%.  -wean [place] as able -Contact/droplet precautions -supportive care  The patient's presentation is complicated by the following clinically significant conditions requiring additional evaluation and treatment: - Disorders of electrolytes, volume status, and acid/base status: - Hypokalemia requiring further investigation, treatment, or monitoring - Age related debility POA requiring additional resources: DME, PT, or OT  Chronic Problems  COPD: Equivalent of home Trelegy inhaler and prn albuterol . Continue home prednisone  10mg  daily. Goal SpO2 88-92% Chronic pain  Fibromyalgia: Continue home Cymbalta  20 mg daily, gabapentin  300 mg daily, as needed methocarbamol 500 mg daily, and as needed Percocet 10-325 mg HLD: Continue home bempedoic acid-ezetimibe  HTN: Continue home amlodipine  5 mg daily, losartan  25 mg daily Depression/Anxiety: Continue home Lexapro  20 mg daily and prn xanax   Checklist: Diet: Regular Diet DVT PPx: Lovenox 40mg  q24h Code Status: Full Code Dispo: Patient appropriate for Observation based on expectation of ongoing need for hospitalization less than two midnights and/or low intensity of services provided  Team Contact Information:  Primary Team: Internal Medicine (MEDM) Primary Resident: Schuyler DELENA Mayotte, MD Resident's Pager: [phone] MedM Intern)  Chief Concern:  Lower extremity edema   Subjective:  April Best is a 73 y.o. female with pertinent PMHx of  HTN, HLD, chronic pain/fibromyalgia, COPD, and recent liver  biopsy for suspected metastatic lung cancer presenting with worsening lower extremity edema, abdominal distention, and abdominal pain since her liver biopsy on 05/03/24.   History obtained from patient.   HPI: Patient reports that the primary reason she presented to the emergency department on 11/20 was due to worsening swelling in her feet/ankles as well as worsening swelling and pain in her abdomen.  She reports that the symptoms started after her recent liver biopsy on 11/17.  She reports that this biopsy was being done due to concern for cancer, however she has no confirmed diagnosis at this time and has never been on chemotherapy.  She has not had any nausea, vomiting, diarrhea, or decreased appetite.  She has not had any chest pain, shortness of breath, difficulty lying flat, or pain in her calves.  She reports that her abdomen has felt very full and is quite tender.  She has not had any fevers or chills. Denies dysuria.  She reports that for the past month or so she has had worsening pain all over her body.  She does endorse chronic back pain, however her pains have been worsening recently.  She lives with her husband and was previously quite independent, however has recently started needing assistance with getting up due to her pain.  In the emergency department she was noted to be afebrile and hemodynamically stable.  Was tachycardic with heart rate 110s to 120s.  Labs were notable for leukocytosis with WBC 20.3 with left shift.  CMP notable for hypokalemia with potassium 3.2, hyperglycemia with blood sugar 380, and elevated LFTs with total bilirubin 2.6, AST 74, ALT 221, alk phos 262.  Initial lactate was 2.8, improved to 1.6 without IV fluids.  Troponin mildly elevated at 49 without  significant subsequent increase.  proBNP elevated at 1334, however no recent baseline.  D-dimer elevated to 1522. A chest x-ray was obtained which revealed bilateral medial basilar subsegmental atelectasis and minimal  biapical scarring without apparent pulmonary edema or focal consolidations.  A CTA chest was also obtained which revealed no pulmonary embolism.  Did note airway inflammation and emphysema as well as a right lower lobe pleural-based nodule measuring 1.9 cm which has been seen on prior OSH imaging.  Also noted multiple hypoattenuating hepatic masses and left adrenal nodularity.  RPP was positive for rhinovirus infection.  She was given vancomycin  and cefepime.  She was placed on 2 L nasal cannula and was admitted for ongoing evaluation and management.  Pertinent Surgical Hx Liver biopsy with VIR on 05/03/24  Pertinent Social Hx  Reports that she lives with her husband. Previously independent in ADLs but now requires assistance with getting up due to pain. Currently smoking ~1ppd and has been smoking for approximately 65yrs. 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 Denies recreational drug use.   Allergies Amoxicillin-pot clavulanate, Etanercept, Modafinil, Lisinopril, Cefdinir , and Nitrofurantoin   I reviewed the Medication List. The current list is Accurate Prior to Admission medications  Medication Dose, Route, Frequency  albuterol  HFA 90 mcg/actuation inhaler 2 puffs, Every 6 hours PRN  ALPRAZolam  (XANAX ) 0.5 MG tablet 0.5 mg, Nightly PRN  amlodipine  (NORVASC ) 5 MG tablet 5 mg, Daily (standard)  bempedoic acid-ezetimibe  (NEXLIZET ) 180-10 mg Tab 1 tablet, Daily (standard)  DULoxetine  (CYMBALTA ) 20 MG capsule 20 mg, Daily (standard)  escitalopram  oxalate (LEXAPRO ) 20 MG tablet 20 mg, Daily (standard)  fluticasone -umeclidin-vilanter (TRELEGY ELLIPTA ) 200-62.5-25 mcg inhaler 1 puff, Daily (standard)  gabapentin  (NEURONTIN ) 300 MG capsule 300 mg, Daily  losartan  (COZAAR ) 25 MG tablet 25 mg, Daily (standard)  methocarbamol (ROBAXIN) 500 MG tablet 500 mg, Daily PRN  predniSONE  (DELTASONE ) 10 MG tablet 10 mg, Daily (standard)   oxyCODONE -acetaminophen  (PERCOCET) 10-325 mg per tablet 1 tablet, Oral, Every 4 hours PRN    Designated Healthcare Decision Maker: Ms. Eder currently has decisional capacity for healthcare decision-making and is able to designate a surrogate healthcare decision maker. Ms. Pacholski designated healthcare decision maker(s) is/are Tim Hackbart (the patient's spouse) as denoted by stated patient preference.  Objective:  Physical Exam: Temp:  [36.4 C (97.5 F)-36.9 C (98.4 F)] 36.9 C (98.4 F) Pulse:  [104-126] 104 SpO2 Pulse:  [103-116] 103 Resp:  [16-25] 19 BP: (153-189)/(82-115) 177/82 SpO2:  [88 %-97 %] 94 %  Gen: NAD, converses, awake in bed, grimaces with movement Eyes: Sclera mildly icteric, EOMI grossly normal  HENT: Atraumatic, normocephalic Neck: Trachea midline Heart: RRR Lungs: Lungs clear anteriorly (patient unable to sit forward for auscultation of posterior lung fields), normal work of breathing and saturating 98% on 2L [place] Abdomen: firm, mildly distended, diffusely tender to palpation. Bruising in RUQ  Extremities: 2+ pitting edema in R ankle/foot, 1+ pitting edema in L foot  Neuro: Grossly symmetric, non-focal   Skin:  No rashes, lesions on clothed exam Psych: Alert, oriented   Olivia A. Lawernce, MD Internal Medicine & Pediatrics, PGY-4 [facility]     This note may have been completed with the assistance of Dragon voice recognition software. Dictation errors were sought, but may not have been fully corrected.

## 2024-05-07 DIAGNOSIS — F32A Depression, unspecified: Secondary | ICD-10-CM | POA: Diagnosis not present

## 2024-05-07 DIAGNOSIS — M797 Fibromyalgia: Secondary | ICD-10-CM | POA: Diagnosis not present

## 2024-05-07 DIAGNOSIS — J449 Chronic obstructive pulmonary disease, unspecified: Secondary | ICD-10-CM | POA: Diagnosis not present

## 2024-05-07 DIAGNOSIS — R7402 Elevation of levels of lactic acid dehydrogenase (LDH): Secondary | ICD-10-CM | POA: Diagnosis not present

## 2024-05-07 DIAGNOSIS — B348 Other viral infections of unspecified site: Secondary | ICD-10-CM | POA: Diagnosis not present

## 2024-05-07 DIAGNOSIS — Z7901 Long term (current) use of anticoagulants: Secondary | ICD-10-CM | POA: Diagnosis not present

## 2024-05-07 DIAGNOSIS — C787 Secondary malignant neoplasm of liver and intrahepatic bile duct: Secondary | ICD-10-CM | POA: Diagnosis not present

## 2024-05-07 DIAGNOSIS — F419 Anxiety disorder, unspecified: Secondary | ICD-10-CM | POA: Diagnosis not present

## 2024-05-07 DIAGNOSIS — R000 Tachycardia, unspecified: Secondary | ICD-10-CM | POA: Diagnosis not present

## 2024-05-07 DIAGNOSIS — R0602 Shortness of breath: Secondary | ICD-10-CM | POA: Diagnosis not present

## 2024-05-07 DIAGNOSIS — M1990 Unspecified osteoarthritis, unspecified site: Secondary | ICD-10-CM | POA: Diagnosis not present

## 2024-05-07 DIAGNOSIS — Z79891 Long term (current) use of opiate analgesic: Secondary | ICD-10-CM | POA: Diagnosis not present

## 2024-05-07 DIAGNOSIS — K5900 Constipation, unspecified: Secondary | ICD-10-CM | POA: Diagnosis not present

## 2024-05-07 DIAGNOSIS — E119 Type 2 diabetes mellitus without complications: Secondary | ICD-10-CM | POA: Diagnosis not present

## 2024-05-07 DIAGNOSIS — E7800 Pure hypercholesterolemia, unspecified: Secondary | ICD-10-CM | POA: Diagnosis not present

## 2024-05-07 DIAGNOSIS — E876 Hypokalemia: Secondary | ICD-10-CM | POA: Diagnosis not present

## 2024-05-07 DIAGNOSIS — D6869 Other thrombophilia: Secondary | ICD-10-CM | POA: Diagnosis not present

## 2024-05-07 DIAGNOSIS — Z1152 Encounter for screening for COVID-19: Secondary | ICD-10-CM | POA: Diagnosis not present

## 2024-05-07 DIAGNOSIS — Z7951 Long term (current) use of inhaled steroids: Secondary | ICD-10-CM | POA: Diagnosis not present

## 2024-05-07 DIAGNOSIS — Z794 Long term (current) use of insulin: Secondary | ICD-10-CM | POA: Diagnosis not present

## 2024-05-07 DIAGNOSIS — Z79899 Other long term (current) drug therapy: Secondary | ICD-10-CM | POA: Diagnosis not present

## 2024-05-07 DIAGNOSIS — C3431 Malignant neoplasm of lower lobe, right bronchus or lung: Secondary | ICD-10-CM | POA: Diagnosis not present

## 2024-05-07 DIAGNOSIS — N390 Urinary tract infection, site not specified: Secondary | ICD-10-CM | POA: Diagnosis not present

## 2024-05-07 DIAGNOSIS — K5732 Diverticulitis of large intestine without perforation or abscess without bleeding: Secondary | ICD-10-CM | POA: Diagnosis not present

## 2024-05-07 DIAGNOSIS — I252 Old myocardial infarction: Secondary | ICD-10-CM | POA: Diagnosis not present

## 2024-05-07 DIAGNOSIS — M810 Age-related osteoporosis without current pathological fracture: Secondary | ICD-10-CM | POA: Diagnosis not present

## 2024-05-07 DIAGNOSIS — R54 Age-related physical debility: Secondary | ICD-10-CM | POA: Diagnosis not present

## 2024-05-07 LAB — SURGICAL PATHOLOGY

## 2024-05-07 NOTE — Progress Notes (Signed)
 ------------------------------------------------------------------------------- Attestation with edits by Clem Jenkins Jansky, MD at 05/07/24 1755 I saw and evaluated the patient, participating in the key portions of the service.  I reviewed the resident's note.  I agree with the resident's findings and plan.   I personally spent 65 minutes face-to-face and non-face-to-face in the care of this patient, which includes all pre, intra, and post visit time on the date of service. Included calling pathology at [facility] and coordinating results, discussing care with oncology, radiation team, and patient and family at the bedside.  All documented time was specific to the E/M visit and does not include any procedures that may have been performed.  Jenkins Jansky Clem, MD  [facility] Assistant Professor of Hospital Medicine   -------------------------------------------------------------------------------  Internal Medicine (MEDM) Progress Note  Assessment & Plan:  April Best is a 73 y.o. female who is presenting to [facility] with Lower extremity edema, abdominal distension, and abdominal pain, in the setting of the following pertinent/contributing co-morbidities: HTN, HLD, chronic pain/fibromyalgia, COPD, and recent liver biopsy for suspected metastatic lung cancer   Principal Problem:   Lower extremity edema Active Problems:   Chronic pain disorder   Depression with anxiety   Benign essential hypertension   Hypercholesteremia   Chronic obstructive pulmonary disease (CMS-HCC)   Rhinovirus infection   Abdominal pain   Leukocytosis   Elevated LFTs  Active Problems  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 Her cross sectional imaging demonstrates a RLL pleural-based nodule and innumerable metastases in her liver. MRI brain completed  today without formal read. RUQUS with patent hepatic vasculature. PVLs without evidence of LE DVT. Altogether suspect her constellation of presenting symptoms (N/V, abdominal pain, LE edema, and lft abnormalities) are a result of hepatic metastasis. Will need inpatient chemotherapy, will initiate transfer to main campus -- Transfer to Med O at [facility] when space available -- Stop IV Vancomycin  / IV Zosyn   Rhinovirus  Leukocytosis RPP positive for rhinovirus in ED 11/20.  Patient denies shortness of breath, cough, congestion, fevers, chills, or chest tightness.  Placed on 2L [place] due to SpO2 93%, however patient has COPD and therefore should have goal 88-92%.  -- wean [place] as able -- Contact/droplet precautions -supportive care  New Diagnosis of Type 2 DM - On chronic prednisone . No prior history of T2DM. HbA1C 8.3  -Monitor on SSI, anticipate she may need basal insulin    Chronic Problems  COPD: Equivalent of home Trelegy inhaler and prn albuterol . Continue home prednisone  10mg  daily. Goal SpO2 88-92% Chronic pain  Fibromyalgia: Continue home Cymbalta  20 mg daily, gabapentin  300 mg daily, as needed methocarbamol 500 mg daily, and as needed Percocet 10-325 mg HLD:  home bempedoic acid-ezetimibe  held as non-formulary  HTN: Continue home amlodipine  5 mg daily, losartan  25 mg daily Depression/Anxiety: Continue home Lexapro  20 mg daily and prn xanax   The patient's presentation is complicated by the following clinically significant conditions requiring additional evaluation and treatment: - Disorders of electrolytes, volume status, and acid/base status: - Hypokalemia requiring further investigation, treatment, or monitoring - Age related debility POA requiring additional resources: DME, PT, or OT  Daily Checklist: Diet: Regular Diet DVT PPx: Lovenox 40mg  q24h Electrolytes: Replete Potassium to >/= 3.6 and Magnesium to >/= 1.8 Code Status: Full Code Dispo: transfer to Med O for inpatient  chemo  Team Contact Information:  Primary Team: Internal Medicine (MEDM) Primary Resident: Johannah LOISE Hunt, MD,  Resident's Pager: [phone] [facility] Senior Resident)  Interval History:  No acute events overnight.  Explained to April Best that she has metastatic lung cancer this morning. Family updated as well. They asked thoughtful questions about her diagnosis and next steps. She continues to endorse abdominal pain, LE swelling, cough, and runny nose.   Objective:  Temp:  [36.8 C (98.2 F)-37 C (98.6 F)] 36.8 C (98.2 F) Pulse:  [102-112] 108 SpO2 Pulse:  [103-110] 103 Resp:  [18-20] 18 BP: (168-189)/(82-104) 177/93 FiO2 (%):  [1.5 %-2 %] 1.5 % SpO2:  [88 %-98 %] 95 %,  Intake/Output Summary (Last 24 hours) at 05/07/2024 1738 Last data filed at 05/07/2024 1615 Gross per 24 hour  Intake 600 ml  Output 801 ml  Net -201 ml  , Body mass index is 20.06 kg/m.  Gen: chronically ill appearing HENT: atraumatic, normocephalic Heart: RRR Lungs: Lungs clear anteriorly (patient unable to sit forward for auscultation of posterior lung fields), normal work of breathing and saturating 98% on 2L [place]  Abdomen: firm, mildly distended, diffusely tender to palpation. Bruising in RUQ  Extremities: 2+ pitting edema in R ankle/foot, 1+ pitting edema in L foot   April Hunt, MD  [facility] Internal Medicine, PGY-3

## 2024-05-07 NOTE — Consults (Signed)
 ------------------------------------------------------------------------------- Attestation signed by April April BIRCH, MD at 05/07/24 2148 (Updated) TEACHING PHYSICIAN ATTESTATION  I reviewed the resident's note and agree with them as documented. I agree with the resident's findings and plan for this e-consult.   73 year old F with COPD, > 50 pack year smoking , liver biopsy on 11/17 at [facility] presented to [facility] 11/20 due to abdominal pain and LE edema.   Path report from 11/17 liver biopsy at [facility]) resulted today confirming small cell carcinoma.  --Brain MRI completed, result pending --Recommend transfer to Med O as soon as possible to evaluate overall status and consider starting inpatient chemotherapy.   April Myer, MD [facility] Medical Oncology  -------------------------------------------------------------------------------  Oncology E-Consult Note  Requesting Attending Physician :  Dr. Jenkins Best Service Requesting Consult : Med Hosp Teaching (MDM) Reason for Consult: c/f metastatic cancer with known lesions in lung and liver Primary Oncologist: N/A  Assessment: April Best is a 73 y.o. 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 Oncology was consulted for evaluation of suspected metastatic lung cancer affecting the liver.   Known suspicious RLL nodule noted in 2024, now increased in size with evidence of metastasis to liver. 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 We recommend transfer from [facility] to [facility] for possible initiation of chemotherapy and further evaluation of GOC and functional status given new diagnosis.    RECOMMENDATIONS [facility] for further workup and evaluation of treatment options for newly diagnosed metastatic SCLC Agree with  brain MRI to assist in treatment planning    This patient has been staffed with Dr. Myer. These recommendations were discussed with the primary team.   Please contact the oncology consult fellow at [phone] with any further questions.  April JAYSON Pais, MD Medicine-Pediatrics PGY-1 [facility]  -------------------------------------------------------------  HPI: April Best is a 73 y.o. female with hx psoriatic arthritis on DMARD, COPD who is being seen at the request of Dr. Jenkins Best for evaluation of metastatic cancer, thought to be lung -> liver.   She presents to [facility]  Has 52py smoking history, and has undergone yearly low-dose chest CT for lung cancer screening with records available from 2020 on. Small (largest 3.47mm) pulmonary nodules noted on CT dating back to 2020, unclear if present prior. Most nodules reported to remain stable in size until a new, posterior right lower lobe nodule measuring 8mm discovered in 04/2023 on screening CT.   PET to follow up on new nodule in 01/25 was suboptimal, with the area of concern obscured by motion artifact, but accentuated metabolic activity in near vicinity (max SUV of 3.7) was suspicious for malignancy; no findings consistent with metastatic disease at this time per radiology report from OSH. Follow up high-res CT later than month showed interval increase in size of the nodule from 8mm to 10mm, which was read as highly concerning for bronchogenic carcinoma.  April Best underwent navigational bronchoscopy on 08/21/23 with transbronchial needle brushings + needle biopsies sent for pathology, reported negative for malignancy per pulmonology note from 03/12.   In return visit from 10/2023, pulmonology discussed with April Best options including repeat biopsy vs radiographic surveillance; she opted for surveillance at that time with a 67mo schedule in place.   Had repeat CT chest that showed enlarging right  lower lobe nodule  and multiple hypodense lesions now seen on superior aspect of liver, concerning for metastasis. It was recommended to obtain a liver biopsy with concern for metastatic lung cancer; this was obtained 11/17.  Since the biopsy, she endorses shortness of breath, abdominal pain, leg swelling, and abdominal distention, leading her to present to the ED. Found to be rhino/enterovirus positive and had elevated LFT on presentation. CT chest/abdomen/pelvis shows many hypodense liver lesions along with known concerning pulmonary nodule.  Review of Systems: All positive and pertinent negatives are noted in the HPI; a 10 system review of systems was otherwise negative except as noted in HPI.  Oncologic History: Hematology/Oncology History   No problem history exists.    Past Medical History[1]  Past Surgical History[2]  Family History[3]  Social History [4]  Social History   Social History Narrative  . Not on file    Allergies: is allergic to amoxicillin-pot clavulanate, etanercept, modafinil, lisinopril, cefdinir , and nitrofurantoin .  Medications:  Meds:Scheduled Medications[5] Continuous Infusions:Infusions Meds[6] PRN Meds:.PRN Medications[7]  [No matching plan found] [No matching plan found]  Objective:  Vitals: Temp:  [36.4 C (97.5 F)-37 C (98.6 F)] 37 C (98.6 F) Pulse:  [102-126] 107 SpO2 Pulse:  [103-116] 103 Resp:  [16-25] 18 BP: (153-189)/(82-115) 176/89 MAP (mmHg):  [107-129] 112 FiO2 (%):  [1.5 %-2 %] 1.5 % SpO2:  [88 %-98 %] 93 % BMI (Calculated):  [20.05-23.01] 20.05 No intake/output data recorded.  ECOG Performance Status: Per discussion with primary team, 3 - Capable of only limited selfcare, confined to bed or chair more than 50% of waking hours  Physical Exam: BP 176/89   Pulse 107   Temp 37 C (98.6 F) (Oral)   Resp 18   Ht 162.6 cm (5' 4)   Wt 53 kg (116 lb 13.5 oz)   SpO2 93%   BMI 20.06 kg/m    E-consult, physical exam not  performed.   Test Results Imaging: Radiology studies were personally reviewed  CT Abdomen Pelvis W Contrast Result Date: 05/07/2024  Impression: Enlarged, undulating liver with innumerable bilobar hypoattenuating hepatic lesions concerning for metastatic disease. Recommend correlation with pathology results from liver biopsy dated 05/03/2024. Mild thickening at the hepatic flexure colon may be reactive. Additional chronic/incidental findings as detailed in the body the report. Please see earlier same day CTA chest for findings above the diaphragm. ==================== MODIFIED REPORT: (05/07/2024 6:10 AM) This report has been modified from its preliminary version; you may check the prior versions of radiology report, results history link for prior report versions (if they were previously visible in Epic). -----------------------------------------------  CTA Chest W Contrast Result Date: 05/06/2024  Impression: - No pulmonary embolism. Acute versus chronic airway inflammation and emphysema as well as atherosclerosis - Right lower lobe pleural-based 1.9 cm nodule. Per review of the electronic medical record the patient was recently diagnosed with malignancy. Additional records are not available for review at the time of dictation and it is unclear if this pulmonary nodule represents a primary or metastatic lesion. Additionally, there are multiple hypoattenuating hepatic masses and left adrenal nodularity. Further evaluation of the abdomen with outpatient CT abdomen/pelvis is recommended.  I, Adine Caraway, MD, have personally reviewed the images and concur with the resident preliminary report above. This report now represents the final report for this patient.   XR Chest 2 views Result Date: 05/06/2024  Impression: Bilateral medial basilar subsegmental atelectasis. Minimal right apical scarring. No consolidation elsewhere.          [1] No past  medical history on file. [2] No past surgical  history on file. [3] History reviewed. No pertinent family history. [4]   Social Drivers of Health   Food Insecurity: No Food Insecurity (10/08/2023)   Received from [facility]   Hunger Vital Sign   . Within the past 12 months, you worried that your food would run out before you got the money to buy more.: Never true   . Within the past 12 months, the food you bought just didn't last and you didn't have money to get more.: Never true  Tobacco Use: High Risk (05/03/2024)   Received from [facility]   Patient History   . Smoking Tobacco Use: Every Day   . Smokeless Tobacco Use: Never  Transportation Needs: No Transportation Needs (10/08/2023)   Received from [facility] - Transportation   . Lack of Transportation (Medical): No   . Lack of Transportation (Non-Medical): No  Housing: Unknown (02/24/2024)   Received from [facility]   Housing Stability Vital Sign   . At any time in the past 12 months, were you homeless or living in a shelter (including now)?: No  Physical Activity: Inactive (10/08/2023)   Received from [facility]   Exercise Vital Sign   . On average, how many days per week do you engage in moderate to strenuous exercise (like a brisk walk)?: 0 days   . On average, how many minutes do you engage in exercise at this level?: 0 min  Utilities: Not At Risk (10/08/2023)   Received from [facility] Utilities   . Threatened with loss of utilities: No  Stress: No Stress Concern Present (10/08/2023)   Received from [facility] - Occupational Stress Questionnaire   . Feeling of Stress : Only a little  Interpersonal Safety: Not At Risk (05/07/2024)   Interpersonal Safety   . Unsafe Where You Currently Live: No   . Physically Hurt by Anyone: No   . Abused by Anyone: No  Social Connections: Moderately Integrated (10/08/2023)   Received from [facility]   Social Connection and Isolation Panel   . In a  typical week, how many times do you talk on the phone with family, friends, or neighbors?: More than three times a week   . How often do you get together with friends or relatives?: Never   . How often do you attend church or religious services?: More than 4 times per year   . Do you belong to any clubs or organizations such as church groups, unions, fraternal or athletic groups, or school groups?: No   . How often do you attend meetings of the clubs or organizations you belong to?: Never   . Are you married, widowed, divorced, separated, never married, or living with a partner?: Married  Physicist, Medical Strain: Low Risk  (10/08/2023)   Received from [facility]   Overall Financial Resource Strain (CARDIA)   . Difficulty of Paying Living Expenses: Not hard at all  Health Literacy: Adequate Health Literacy (10/08/2023)   Received from [facility]   B1300 Health Literacy   . Frequency of need for help with medical instructions: Never  [5] . amlodipine   5 mg Oral Daily  . DULoxetine   20 mg Oral Daily  . enoxaparin (LOVENOX) injection  30 mg Subcutaneous Q24H  . escitalopram  oxalate  20 mg Oral Daily  . fluticasone  furoate-vilanterol  1 puff Inhalation Daily (RT)  And  . umeclidinium  1 puff Inhalation Daily (RT)  . gabapentin   300 mg Oral Daily  . insulin lispro  0-20 Units Subcutaneous ACHS  . losartan   25 mg Oral Daily  . piperacillin-tazobactam (ZOSYN) IV (intermittent)  4.5 g Intravenous Q6H  . predniSONE   10 mg Oral Daily  [6] [7] acetaminophen , albuterol , ALPRAZolam , dextrose in water, glucagon, glucose, methocarbamol, oxyCODONE 

## 2024-05-08 ENCOUNTER — Other Ambulatory Visit: Payer: Self-pay | Admitting: Family Medicine

## 2024-05-08 DIAGNOSIS — F410 Panic disorder [episodic paroxysmal anxiety] without agoraphobia: Secondary | ICD-10-CM

## 2024-05-08 NOTE — Progress Notes (Signed)
 ------------------------------------------------------------------------------- Attestation signed by Tiajuana Sages, MD at 05/09/24 1230 I saw and examined patient with Dr. Kalvin Borg, Resident in Medicine, and agree with his assessment and plan. -------------------------------------------------------------------------------  Oncology (MEDO) Progress Note  Assessment & Plan:  April Best is a 73 y.o. female who is presenting to [facility] with Lower extremity edema, in the setting of the following pertinent/contributing co-morbidities: metastatic SCLC\.  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  She was transferred to [facility] for consideration of palliative systemic therapy. - MRI brain done on 11/21, but read pending - Starting C1D1 carboplatin/etoposide on 11/22 PM   Rhinovirus infection On 2L [place] at time of transfer, RPP positive for rhinovirus on 11/21.  No ongoing upper respiratory symptoms. - Supportive care   Leukocytosis (chronic) Seems to have chronic elevation dating back many years, likely in setting of chronic prednisone  use.  Initially elevated to 20, likely multifactorial with steroid use, metastatic malignancy, and rhinovirus.  Received vancomycin  x1, and Zosyn 11/20-11/21 before discontinuation due to low concern for systemic bacterial infection. - Continue to monitor  for bacterial infection  T2DM, newly diagnosed A1c 8.3%.  Has been requiring sliding scale insulin. - Glargine 6U nightly - Lispro 3U with meals - SSI   HTN Persistently hypertensive with SBP 160-200. - Increased amlodipine  to 10 mg daily - Increased losartan  50 mg daily  The patient's presentation is complicated by the following clinically significant conditions requiring additional evaluation and treatment: - Disorders of electrolytes, volume status, and acid/base status: - Hypokalemia requiring further investigation, treatment, or monitoring - Hypercoagulable state requiring additional attention to DVT prophylaxis and treatment or chronic anticoagulation secondary to Malignancy  - Metastatic cancer POA requiring further investigation, treatment, or monitoring             Issues Impacting Complexity of Management: <redacted file path> -The patient is at high risk of complications from carboplatin/etoposide  Chronic Problems COPD: Equivalent of home Trelegy inhaler and prn albuterol . Continue home prednisone  10mg  daily. Goal SpO2 88-92% Chronic pain  Fibromyalgia: Continue home Cymbalta  20 mg daily, gabapentin  300 mg daily, as needed methocarbamol 500 mg daily, and as needed Percocet 10-325 mg. Uses prednisone  for pain, fibromyalgia HLD:  home bempedoic acid-ezetimibe  held as non-formulary  Depression/Anxiety: Continue home Lexapro  20 mg daily and prn xanax   Daily Checklist: Diet: Regular Diet DVT PPx: Lovenox 30mg  q24h Electrolytes: Replete Potassium to >/= 3.6 and Magnesium to >/= 1.8 Code Status: Full Code Dispo: Goal Discharge: 11/24-11/25  Team Contact Information:  Primary Team: Oncology (MEDO) Primary Resident: Kalvin Borg, MD Resident's Pager: [phone] (Oncology Senior Resident)  Interval History:  NAEON.  Patient and family wanted to discuss whether or not it was a good idea for initiation of inpatient chemotherapy.  Discussed that it was imperative to start  chemotherapy quickly for SCLC.  Patient and family agreeable to this plan for 3-day regimen followed by outpatient follow-up  with primary thoracic oncologist.  All other systems were reviewed and are negative except as noted in the HPI  Objective:  Temp:  [36.8 C (98.2 F)-37.2 C (99 F)] 37.1 C (98.8 F) Pulse:  [102-116] 116 Resp:  [18-20] 20 BP: (160-213)/(77-101) 160/77 SpO2:  [94 %-100 %] 96 %  Gen: chronically ill appearing HENT: atraumatic, normocephalic Heart: RRR Lungs: Lungs clear anteriorly, normal work of breathing on 2L   Abdomen: firm, mildly distended, diffusely tender to palpation. Bruising in RUQ  Extremities: 2+ pitting edema in R ankle/foot, 1+ pitting edema in L foot  [name], MD PGY3 Internal Medicine

## 2024-05-09 NOTE — Care Plan (Signed)
 Had to instruct patient to keep their oxygen on three times. She would sat in the 80s and then return to the 90s after putting back on the oxygen. Notified provider about the high blood pressure and no interventions were done. No acute events happened. Shift Summary OxyCODONE  and acetaminophen  were administered for severe lower extremity pain, resulting in a slight reduction in pain score. Correctional insulin was given in response to elevated blood glucose, and blood glucose was monitored during the shift. Fall prevention and safety interventions were maintained at all times, with no reported falls or injuries. Sepsis risk scores briefly increased but returned to baseline, and temperature remained stable throughout the shift. The patient remained safe and free from new hospital-acquired complications during the shift.  Absence of Hospital-Acquired Illness or Injury: No new hospital-acquired injuries or complications were documented during the shift, and safety interventions such as bed alarms and side rails remained in place throughout.  Optimal Comfort and Wellbeing: Generalized weakness and drowsiness persisted, and nutrition was probably inadequate, but the environment was kept safe and interventions for comfort were maintained.  Absence of Infection Signs and Symptoms: Temperature remained stable and within normal limits, and sepsis risk scores were low for most of the shift with brief elevations that returned to baseline; no new skin abnormalities were noted beyond existing petechiae, bruising, and IAD in the perineum.  Absence of Fall and Fall-Related Injury: Fall prevention measures, including bed alarms, side rails, and scheduled toileting, were consistently implemented, and no falls or injuries were reported during the shift.  Optimal Pain Control and Function: Pain in the lower extremity decreased from 10 to 9 after administration of oxyCODONE  and acetaminophen , and the patient was able to  rest for portions of the shift.

## 2024-05-09 NOTE — Procedures (Signed)
 VENOUS ACCESS TEAM PROCEDURE    Nurse request was placed for a PIV by Venous Access Team (VAT).  Patient was assessed at bedside for placement of a PIV. PPE were donned per protocol.  Access was obtained. Blood return noted.  Dressing intact and device well secured.  Flushed with normal saline.  See LDA for details.  Pt advised to inform RN of any s/s of discomfort at the PIV site.    Workup / Procedure Time:  30 minutes       Care RN was notified.       Thank you,     Lorelle Formosa, RN Venous Access Team

## 2024-05-09 NOTE — Progress Notes (Signed)
 ------------------------------------------------------------------------------- Attestation signed by Tiajuana Sages, MD at 05/09/24 1500 I saw and examined patient with Dr. Quintin Bristol, Intern in Medicine, and agree with his assessment and plan. -------------------------------------------------------------------------------  Oncology (MEDO) Progress Note  Assessment & Plan:  April Best is a 73 y.o. female who is presenting to [facility] with Lower extremity edema, in the setting of the following pertinent/contributing co-morbidities: metastatic SCLC\.  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  She was transferred to [facility] for consideration of palliative systemic therapy. - S/p initiation of C1D1 carboplatin/etoposide on 11/22 for 3-day course  - consider resuscitation with LR instead of continuous NS given elevation in sodium - Will need to arrange establishment of care with outpatient oncologist.  HTN Persistently hypertensive with SBP 160-200.  Suspect to be secondary to anxiety surrounding new diagnosis of lung cancer, as patient does endorse severe anxiety. -S/p 5 mg labetalol 11/20 3 AM for concurrent SBP greater than 180 and patient endorsing a headache - Ativan 0.5 mg 3 times daily as needed - Continue amlodipine  10 mg daily - Continue losartan  50 mg daily   Rhinovirus  infection On 2L [place] at time of transfer, RPP positive for rhinovirus on 11/21.  No ongoing upper respiratory symptoms. - Supportive care   Leukocytosis (chronic) Seems to have chronic elevation dating back many years, likely in setting of chronic prednisone  use. Initially elevated to 20, likely multifactorial with steroid use, metastatic malignancy, and rhinovirus.  Received vancomycin  x1, and Zosyn 11/20-11/21 before discontinuation due to low concern for systemic bacterial infection. - Continue to monitor for bacterial infection  T2DM, newly diagnosed A1c 8.3%.  Has been requiring sliding scale insulin. - Diabetes educator - Glargine 6U nightly - Lispro 3U with meals - SSI  Constipation Patient with no bowel movement since transfer. - MiraLAX  and senna twice daily  Chronic Problems COPD: Equivalent of home Trelegy inhaler and prn albuterol . Continue home prednisone  10mg  daily. Goal SpO2 88-92% Chronic pain  Fibromyalgia: Continue home Cymbalta  20 mg daily, gabapentin  300 mg daily, as needed methocarbamol 500 mg daily, and as needed Percocet 10-325 mg. Uses prednisone  for pain, fibromyalgia HLD:  home bempedoic acid-ezetimibe  held as non-formulary  Depression/Anxiety: Continue home Lexapro  20 mg daily and prn xanax , as above.  The patient's presentation is complicated by the following clinically significant conditions requiring additional evaluation and treatment: - Disorders of electrolytes, volume status, and acid/base status: - Hypokalemia requiring further investigation, treatment, or monitoring - Hypercoagulable state requiring additional attention to DVT prophylaxis and treatment or chronic anticoagulation secondary to Malignancy  - Metastatic cancer POA requiring further investigation, treatment, or monitoring  Issues Impacting Complexity of Management: <redacted file path> -The patient is at high risk of complications from carboplatin/etoposide  Daily Checklist: Diet:  Regular Diet DVT PPx: Lovenox 30mg  q24h Electrolytes: Replete Potassium to >/= 3.6 and Magnesium to >/= 1.8 Code Status: Full Code Dispo: Goal Discharge: 11/24-11/25  Team Contact Information:  Primary Team: Oncology (MEDO) Primary Resident: Quintin LITTIE Bristol, MD Resident's Pager: [phone] (Oncology Senior Resident)  Interval History:  April Best.    Patient reports substantial anxiety surrounding new diagnosis of small cell lung cancer.  Suspect this to be why her blood pressure is so elevated above her usual baseline.  All other systems were reviewed and are negative except as noted in the HPI  Objective:  Temp:  [36.8 C (98.2 F)-37.1 C (98.8 F)] 36.8 C (98.3 F) Pulse:  [110-116] 110 Resp:  [20] 20 BP: (160-213)/(77-101) 188/88 SpO2:  [84 %-100 %] 94 %  Gen: chronically ill appearing HENT: atraumatic, normocephalic Heart: RRR Lungs: Lungs clear anteriorly, normal work of breathing on 2L [place]  Abdomen: firm, mildly distended, diffusely tender to palpation. Bruising in RUQ  Extremities: 2+ pitting edema in R ankle/foot, 1+ pitting edema in L foot  Quintin Jama Bristol, MD [facility] Internal Medicine, PGY-1

## 2024-05-09 NOTE — Care Plan (Signed)
 Shift Summary Oxycodone  was administered PRN in the afternoon for comfort.  Insulin lispro was given twice during the shift in response to elevated blood glucose levels.  Blood cultures drawn earlier in the stay reported no growth at 72 hours.  Staff provided a bath and foot care in the evening.  Overall, comfort and hygiene needs were addressed, and no new hospital-acquired conditions were documented.   Absence of Hospital-Acquired Illness or Injury: No new hospital-acquired conditions were documented; skin assessment noted petechiae, bruising, and perineal IAD, but peripheral IV site remained clean, dry, and intact, and blood cultures reported no growth at 72 hours.   Optimal Comfort and Wellbeing: Oxycodone  was given PRN in the afternoon, and hygiene needs were addressed with staff-assisted bathing and foot care.   Readiness for Transition of Care: Unplanned readmission score decreased slightly throughout the shift, and IRF candidacy score remained stable at 60.   Rounds/Family Conference: Family visited in the afternoon and early evening.

## 2024-05-10 ENCOUNTER — Ambulatory Visit: Admitting: Student in an Organized Health Care Education/Training Program

## 2024-05-10 NOTE — Care Plan (Signed)
 Shift Summary OxyCODONE  was administered PRN for severe upper body pain that had increased during the shift. On C1D3 of Carboplatin/Etoposide. Correctional insulin was calculated and administered after a high POC glucose reading of 210 mg/dL earlier in the shift. Fall prevention and infection control interventions were consistently maintained throughout the shift. The patient experienced a significant increase in pain but otherwise remained free of new hospital-acquired injuries or falls during the shift.  Absence of Hospital-Acquired Illness or Injury: No new hospital-acquired injuries were documented, and skin protection interventions such as adhesive use limitation and absorbent pads were maintained throughout the shift.  Optimal Comfort and Wellbeing: Pain in the upper body increased from 0 to 10 and was described as intermittent, aching, and gradually worsening; oxyCODONE  was administered PRN in response.  Absence of Infection Signs and Symptoms: Aseptic technique and infection prevention measures were consistently maintained, and temperature remained stable at 37.1C (98.23F) throughout the shift.  Absence of Fall and Fall-Related Injury: Fall reduction interventions, including bed alarms, low bed positioning, and scheduled toileting, were maintained with no falls or injuries reported during the shift.  Optimal Gas Exchange: Oxygen was delivered via nasal cannula at a consistent rate of 2.5 L/min, SpO2 ranged from 94% to 96%, and breath sounds remained diminished with regular, unlabored respiratory pattern documented.

## 2024-05-24 ENCOUNTER — Telehealth: Payer: Self-pay | Admitting: *Deleted

## 2024-05-24 ENCOUNTER — Encounter: Payer: Self-pay | Admitting: Internal Medicine

## 2024-05-24 DIAGNOSIS — M25511 Pain in right shoulder: Secondary | ICD-10-CM | POA: Diagnosis not present

## 2024-05-24 NOTE — Telephone Encounter (Signed)
 Patient's husband called and his wife would like a call from Dr. Corky regarding  her stage 4 liver diagnosis.

## 2024-05-24 NOTE — Progress Notes (Signed)
 Returned the patient's phone call; patient getting chemotherapy at [facility] at this time.  Will review records- GB

## 2024-05-25 ENCOUNTER — Telehealth: Payer: Self-pay | Admitting: *Deleted

## 2024-05-25 DIAGNOSIS — D72829 Elevated white blood cell count, unspecified: Secondary | ICD-10-CM | POA: Diagnosis not present

## 2024-05-25 NOTE — Telephone Encounter (Signed)
 Call placed to pt's Husband, Jaeley Wiker. He states his wife is at [employer] for [employer]. Her legs are so weak after chemotherapy at [facility]. I explained to him that his wife is currently under the care of the [facility]. When patient is ready to return to local care, [facility] will usually reach out to us . Husband states he understands. Wife was confused in general and was able to remember Dr Damaris name.

## 2024-05-25 NOTE — Progress Notes (Signed)
 Spoke to patient re: her current oncology status- on chemo under care of [facility]-recommend continue current therapy with [facility].  Will cancel appointments with us  for now.  GB

## 2024-05-26 ENCOUNTER — Other Ambulatory Visit: Payer: Self-pay | Admitting: Family Medicine

## 2024-05-26 DIAGNOSIS — L409 Psoriasis, unspecified: Secondary | ICD-10-CM

## 2024-06-03 ENCOUNTER — Telehealth: Payer: Self-pay | Admitting: *Deleted

## 2024-06-03 NOTE — Telephone Encounter (Addendum)
 Addendum. Spoke with patient. She did not get cycle 2, day 3 today b/c her husband was sick today. Her husband was unable to transport to the apt. Patient stated that she lives at [facility] in [place]. They do not transport her to the apts and she relies on her husband to get her to all apts. It was her understanding that she was coming to the cancer center tomorrow to make up the missed treatment as soon as possible. I explained to her that we need her to finish the remaining cycle at [facility]. Dr. B would review her records and the team could contact her for potentially setting up the next cycle here at [facility]. Patient voiced that she was upset that she could not come here for the remaining treatment of this cycle. I advised pt to call [facility] back to set up her appts to r/s the missed treatment. Our team would be back in touch with her to arrange for future cycles.

## 2024-06-03 NOTE — Telephone Encounter (Addendum)
 Dr. Ether office at [facility] called and requested cancer ctr fax number (which was provided). [facility] will be faxing over a request for patient to have chemotherapy here. Patient is requesting to restart her infusion treatments under the care of Dr. B.  I reviewed care-everywhere- patient is s/p 1 Cycle of CarboPlatin/Vp16 with reduction in pain/with symptomatic response. Does not look like patient receive day 3 of treatment today due to her husband not feeling well this morning.   Per Dr. Boyd notes. will add trilaciclib support but will hold off on atezo until d/w rheumatology. I discussed that this regimen is FDA approved and could be given locally. The patient has already established care with Dr. Cindy JONELLE Joe. I will give C2 to avoid delays, but they will discuss getting scans and C3 with Dr. Joe. They are aware that if they do so, that I will remain available both to them and to Dr. Joe as a consultant (we have both tarlatamab and numerous ES-SCLC trials such as [id number] for CTFI < 90d and GD2.CART).  Please look out for the fax from their office. Thanks.GLENWOOD Moats RN

## 2024-06-08 ENCOUNTER — Telehealth: Payer: Self-pay | Admitting: *Deleted

## 2024-06-08 ENCOUNTER — Other Ambulatory Visit: Payer: Self-pay | Admitting: Internal Medicine

## 2024-06-08 DIAGNOSIS — C3431 Malignant neoplasm of lower lobe, right bronchus or lung: Secondary | ICD-10-CM | POA: Insufficient documentation

## 2024-06-08 NOTE — Telephone Encounter (Signed)
 Patient's husband calling back again. He wants to schedule additional appointment for patient's next tx.  Patient's last day of her cycle was last Friday 06/04/2024 per spouse. He said she is due to 3 weeks from 12/19 for the next cycle. He stated that his wife is very weak and she wants to meet with DR. B prior to these days to discuss her treatment planning moving fwd. I reassured pt's husband that we did receive this message last week and again this morning.  I told the patient's husband that we are waiting for DR. B to enter the treatment plan order so that the scheduler can arrange for the chemotherapy apts. He thanked me for listening and stated that he would await the call from the scheduler. His wife still is requesting an apt within the next wk to discuss her transfer of care w/Dr. B.

## 2024-06-08 NOTE — Telephone Encounter (Signed)
 April Best, patient's spouse, called and left a vm on triage. He is calling to request apts with Dr. Rennie for his wife, who is transferring her chemotherapy care back to Dr. Rennie.

## 2024-06-08 NOTE — Progress Notes (Signed)
 START ON PATHWAY REGIMEN - Small Cell Lung     A cycle is every 21 days:     Carboplatin      Etoposide   **Always confirm dose/schedule in your pharmacy ordering system**  Patient Characteristics: Newly Diagnosed, Preoperative or Nonsurgical Candidate (Clinical Staging), First Line, Extensive Stage Therapeutic Status: Newly Diagnosed, Preoperative or Nonsurgical Candidate (Clinical Staging) Check here if patient was staged using an edition other than AJCC Staging 9th Edition: false AJCC T Category: cT1c AJCC N Category: cNX AJCC M Category: cM1c1 AJCC 9 Stage Grouping: IVB Stage Classification: Extensive Intent of Therapy: Non-Curative / Palliative Intent, Discussed with Patient

## 2024-06-09 ENCOUNTER — Other Ambulatory Visit: Payer: Self-pay | Admitting: *Deleted

## 2024-06-09 ENCOUNTER — Telehealth: Payer: Self-pay | Admitting: Internal Medicine

## 2024-06-09 ENCOUNTER — Other Ambulatory Visit: Payer: Self-pay

## 2024-06-09 DIAGNOSIS — C3431 Malignant neoplasm of lower lobe, right bronchus or lung: Secondary | ICD-10-CM

## 2024-06-09 NOTE — Progress Notes (Addendum)
 Pharmacist Chemotherapy Monitoring - Initial Assessment    Anticipated start date: 06/23/24   The following has been reviewed per standard work regarding the patient's treatment regimen: The patient's diagnosis, treatment plan and drug doses, and organ/hematologic function Lab orders and baseline tests specific to treatment regimen  The treatment plan start date, drug sequencing, and pre-medications Prior authorization status  Patient's documented medication list, including drug-drug interaction screen and prescriptions for anti-emetics and supportive care specific to the treatment regimen The drug concentrations, fluid compatibility, administration routes, and timing of the medications to be used The patient's access for treatment and lifetime cumulative dose history, if applicable  The patient's medication allergies and previous infusion related reactions, if applicable   Changes made to treatment plan:  Emend to [facility] based on insurance    Follow up needed:  N/A and   Patient transferring care from [facility]. Completed #2 cycles with outside faciltity: trilaciclib/carboplatin/etoposide.  Per outside MD, awaiting to hear back from patient's Rheumatologist before starting immunotherapy Last chemo cycle started 12/16 at outside facility   Maudie FORBES Andreas, PharmD, BCPS Clinical Pharmacist   06/09/2024  10:56 AM

## 2024-06-09 NOTE — Telephone Encounter (Signed)
 Spoke to pt's husband, Tim-patient currently feeling weak.-Reviewed appointments.  Please have the patient follow-up with me-next week-MD labs; CBC CMP; LDH- No chemo-  Otherwise keep appointments as planned-in January  Thank you- GB

## 2024-06-10 ENCOUNTER — Emergency Department (HOSPITAL_COMMUNITY)

## 2024-06-10 ENCOUNTER — Other Ambulatory Visit: Payer: Self-pay

## 2024-06-10 ENCOUNTER — Emergency Department (HOSPITAL_COMMUNITY)
Admission: EM | Admit: 2024-06-10 | Discharge: 2024-06-17 | Disposition: E | Attending: Emergency Medicine | Admitting: Emergency Medicine

## 2024-06-10 ENCOUNTER — Encounter (HOSPITAL_COMMUNITY): Payer: Self-pay

## 2024-06-10 DIAGNOSIS — N179 Acute kidney failure, unspecified: Secondary | ICD-10-CM | POA: Diagnosis not present

## 2024-06-10 DIAGNOSIS — J9601 Acute respiratory failure with hypoxia: Secondary | ICD-10-CM | POA: Diagnosis not present

## 2024-06-10 DIAGNOSIS — R579 Shock, unspecified: Secondary | ICD-10-CM | POA: Diagnosis present

## 2024-06-10 DIAGNOSIS — Z85118 Personal history of other malignant neoplasm of bronchus and lung: Secondary | ICD-10-CM | POA: Insufficient documentation

## 2024-06-10 DIAGNOSIS — R9082 White matter disease, unspecified: Secondary | ICD-10-CM | POA: Diagnosis not present

## 2024-06-10 DIAGNOSIS — R140 Abdominal distension (gaseous): Secondary | ICD-10-CM | POA: Diagnosis not present

## 2024-06-10 LAB — URINALYSIS, ROUTINE W REFLEX MICROSCOPIC
Bilirubin Urine: NEGATIVE
Glucose, UA: NEGATIVE mg/dL
Hgb urine dipstick: NEGATIVE
Ketones, ur: NEGATIVE mg/dL
Nitrite: NEGATIVE
Protein, ur: NEGATIVE mg/dL
Specific Gravity, Urine: 1.016 (ref 1.005–1.030)
pH: 5 (ref 5.0–8.0)

## 2024-06-10 LAB — I-STAT ARTERIAL BLOOD GAS, ED
Acid-base deficit: 24 mmol/L — ABNORMAL HIGH (ref 0.0–2.0)
Bicarbonate: 6.7 mmol/L — ABNORMAL LOW (ref 20.0–28.0)
Calcium, Ion: 0.96 mmol/L — ABNORMAL LOW (ref 1.15–1.40)
HCT: 39 % (ref 36.0–46.0)
Hemoglobin: 13.3 g/dL (ref 12.0–15.0)
O2 Saturation: 100 %
Patient temperature: 95.6
Potassium: 5.5 mmol/L — ABNORMAL HIGH (ref 3.5–5.1)
Sodium: 133 mmol/L — ABNORMAL LOW (ref 135–145)
TCO2: 7 mmol/L — ABNORMAL LOW (ref 22–32)
pCO2 arterial: 25.6 mmHg — ABNORMAL LOW (ref 32–48)
pH, Arterial: 7.011 — CL (ref 7.35–7.45)
pO2, Arterial: 416 mmHg — ABNORMAL HIGH (ref 83–108)

## 2024-06-10 LAB — I-STAT CHEM 8, ED
BUN: 70 mg/dL — ABNORMAL HIGH (ref 8–23)
Calcium, Ion: 0.92 mmol/L — ABNORMAL LOW (ref 1.15–1.40)
Chloride: 106 mmol/L (ref 98–111)
Creatinine, Ser: 3.1 mg/dL — ABNORMAL HIGH (ref 0.44–1.00)
Glucose, Bld: 89 mg/dL (ref 70–99)
HCT: 23 % — ABNORMAL LOW (ref 36.0–46.0)
Hemoglobin: 7.8 g/dL — ABNORMAL LOW (ref 12.0–15.0)
Potassium: 5.5 mmol/L — ABNORMAL HIGH (ref 3.5–5.1)
Sodium: 134 mmol/L — ABNORMAL LOW (ref 135–145)
TCO2: 9 mmol/L — ABNORMAL LOW (ref 22–32)

## 2024-06-10 LAB — PROTIME-INR
INR: 1.8 — ABNORMAL HIGH (ref 0.8–1.2)
Prothrombin Time: 21.5 s — ABNORMAL HIGH (ref 11.4–15.2)

## 2024-06-10 LAB — CBC WITH DIFFERENTIAL/PLATELET
Abs Immature Granulocytes: 0.03 K/uL (ref 0.00–0.07)
Basophils Absolute: 0 K/uL (ref 0.0–0.1)
Basophils Relative: 1 %
Eosinophils Absolute: 0 K/uL (ref 0.0–0.5)
Eosinophils Relative: 0 %
HCT: 28 % — ABNORMAL LOW (ref 36.0–46.0)
Hemoglobin: 8.3 g/dL — ABNORMAL LOW (ref 12.0–15.0)
Immature Granulocytes: 1 %
Lymphocytes Relative: 45 %
Lymphs Abs: 1.6 K/uL (ref 0.7–4.0)
MCH: 32.2 pg (ref 26.0–34.0)
MCHC: 29.6 g/dL — ABNORMAL LOW (ref 30.0–36.0)
MCV: 108.5 fL — ABNORMAL HIGH (ref 80.0–100.0)
Monocytes Absolute: 0.2 K/uL (ref 0.1–1.0)
Monocytes Relative: 5 %
Neutro Abs: 1.7 K/uL (ref 1.7–7.7)
Neutrophils Relative %: 48 %
Platelets: 144 K/uL — ABNORMAL LOW (ref 150–400)
RBC: 2.58 MIL/uL — ABNORMAL LOW (ref 3.87–5.11)
RDW: 14.1 % (ref 11.5–15.5)
Smear Review: NORMAL
WBC: 3.6 K/uL — ABNORMAL LOW (ref 4.0–10.5)
nRBC: 0 % (ref 0.0–0.2)

## 2024-06-10 LAB — LIPASE, BLOOD: Lipase: <10 U/L — ABNORMAL LOW (ref 11–51)

## 2024-06-10 LAB — TROPONIN T, HIGH SENSITIVITY: Troponin T High Sensitivity: 216 ng/L (ref 0–19)

## 2024-06-10 LAB — COMPREHENSIVE METABOLIC PANEL WITH GFR
ALT: 2194 U/L — ABNORMAL HIGH (ref 0–44)
AST: 2561 U/L — ABNORMAL HIGH (ref 15–41)
Albumin: 2.6 g/dL — ABNORMAL LOW (ref 3.5–5.0)
Alkaline Phosphatase: 205 U/L — ABNORMAL HIGH (ref 38–126)
BUN: 58 mg/dL — ABNORMAL HIGH (ref 8–23)
CO2: <7 mmol/L — ABNORMAL LOW (ref 22–32)
Calcium: 7.8 mg/dL — ABNORMAL LOW (ref 8.9–10.3)
Chloride: 98 mmol/L (ref 98–111)
Creatinine, Ser: 3.3 mg/dL — ABNORMAL HIGH (ref 0.44–1.00)
GFR, Estimated: 14 mL/min — ABNORMAL LOW
Glucose, Bld: 97 mg/dL (ref 70–99)
Potassium: 5.8 mmol/L — ABNORMAL HIGH (ref 3.5–5.1)
Sodium: 137 mmol/L (ref 135–145)
Total Bilirubin: 1.3 mg/dL — ABNORMAL HIGH (ref 0.0–1.2)
Total Protein: 4.2 g/dL — ABNORMAL LOW (ref 6.5–8.1)

## 2024-06-10 LAB — I-STAT CG4 LACTIC ACID, ED: Lactic Acid, Venous: 13 mmol/L (ref 0.5–1.9)

## 2024-06-10 MED ORDER — GLYCOPYRROLATE 0.2 MG/ML IJ SOLN: 0.2000 mg | INTRAMUSCULAR | Status: DC | PRN

## 2024-06-10 MED ORDER — SODIUM CHLORIDE 0.9 % IV SOLN
2.0000 g | Freq: Once | INTRAVENOUS | Status: AC
  Administered 2024-06-10: 2 g via INTRAVENOUS
  Filled 2024-06-10: qty 10

## 2024-06-10 MED ORDER — IOHEXOL 350 MG/ML SOLN
75.0000 mL | Freq: Once | INTRAVENOUS | Status: AC | PRN
  Administered 2024-06-10: 75 mL via INTRAVENOUS

## 2024-06-10 MED ORDER — POLYVINYL ALCOHOL 1.4 % OP SOLN: 1.0000 [drp] | Freq: Four times a day (QID) | OPHTHALMIC | Status: DC | PRN

## 2024-06-10 MED ORDER — GLYCOPYRROLATE 1 MG PO TABS: 1.0000 mg | ORAL_TABLET | ORAL | Status: DC | PRN

## 2024-06-10 MED ORDER — LACTATED RINGERS IV SOLN: INTRAVENOUS | Status: DC

## 2024-06-10 MED ORDER — MORPHINE 100MG IN NS 100ML (1MG/ML) PREMIX INFUSION
0.0000 mg/h | INTRAVENOUS | Status: DC
  Administered 2024-06-10: 5 mg/h via INTRAVENOUS
  Filled 2024-06-10: qty 100

## 2024-06-10 MED ORDER — VANCOMYCIN HCL IN DEXTROSE 1-5 GM/200ML-% IV SOLN
1000.0000 mg | Freq: Once | INTRAVENOUS | Status: AC
  Administered 2024-06-10: 1000 mg via INTRAVENOUS
  Filled 2024-06-10: qty 200

## 2024-06-10 MED ORDER — NOREPINEPHRINE 4 MG/250ML-% IV SOLN
0.0000 ug/min | INTRAVENOUS | Status: DC
  Administered 2024-06-10: 20 ug/min via INTRAVENOUS
  Filled 2024-06-10: qty 250

## 2024-06-10 MED ORDER — ACETAMINOPHEN 325 MG PO TABS: 650.0000 mg | ORAL_TABLET | Freq: Four times a day (QID) | ORAL | Status: DC | PRN

## 2024-06-10 MED ORDER — SODIUM CHLORIDE 0.9 % IV SOLN: INTRAVENOUS | Status: DC

## 2024-06-10 MED ORDER — LACTATED RINGERS IV BOLUS
2000.0000 mL | Freq: Once | INTRAVENOUS | Status: AC
  Administered 2024-06-10: 2000 mL via INTRAVENOUS

## 2024-06-10 MED ORDER — LACTATED RINGERS IV BOLUS
1000.0000 mL | Freq: Once | INTRAVENOUS | Status: AC
  Administered 2024-06-10: 1000 mL via INTRAVENOUS

## 2024-06-10 MED ORDER — ACETAMINOPHEN 650 MG RE SUPP: 650.0000 mg | Freq: Four times a day (QID) | RECTAL | Status: DC | PRN

## 2024-06-10 MED ORDER — METRONIDAZOLE 500 MG/100ML IV SOLN
500.0000 mg | Freq: Once | INTRAVENOUS | Status: AC
  Administered 2024-06-10: 500 mg via INTRAVENOUS
  Filled 2024-06-10: qty 100

## 2024-06-10 MED ORDER — MORPHINE BOLUS VIA INFUSION: 5.0000 mg | INTRAVENOUS | Status: DC | PRN

## 2024-06-15 ENCOUNTER — Encounter: Payer: Self-pay | Admitting: Internal Medicine

## 2024-06-15 LAB — CULTURE, BLOOD (ROUTINE X 2)
Culture: NO GROWTH
Culture: NO GROWTH
Special Requests: ADEQUATE
Special Requests: ADEQUATE

## 2024-06-15 NOTE — Progress Notes (Signed)
 I spoke to patient husband expressed my condolences on the patient's passing.  Appreciated the phone call- GB

## 2024-06-16 ENCOUNTER — Other Ambulatory Visit: Payer: Self-pay | Admitting: Family Medicine

## 2024-06-17 NOTE — ED Provider Notes (Addendum)
 Patient came in unresponsive.  Intubated central line in place started on norepinephrine .  pH 7.011.  Lactic acid 13.  AKI with a creatinine of 3.1.  Hemoglobin down to 8.3.  Liver enzymes elevated likely due to shock.  No obvious electrolyte abnormality otherwise.  Urinalysis somewhat equivocal for infection.  She is somewhat hypothermic temp Foley has been placed.  Multiple fluid boluses.  Blood cultures collected.  CT scan of head chest abdomen pelvis in process.  Patient is currently now DNR.  Family is okay with maintaining intubation and medical management at this time but would not want any further aggressive care.  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  Will talk with ICU team for admission but likely will have small threshold to make comfort care.  2:50 PM family member has arrived.  We will go ahead with full comfort care at this time.  After discussing with family members they wanted her to be extubated and made full comfort care.  I talked with radiology on the phone.  There is right mainstem bronchus intubation but at this point we will extubate.  She has progression of her hepatic metastasis.  She has shock liver.  Is possible small renal infarcts in both kidneys likely from profound hypotension.  There is also shock bowel.  At this point I think comfort care is very reasonable family is comfortable with this decision.  It was in her living will that she did not want to be put on life support as well.  Ultimately patient to be extubated started on morphine .  Patient was pronounced deceased at 1440 by nursing staff.  Patient will not be an ME case.  Will send a certificate to her primary team, Dr. Angela Bacigalupo and make them aware of the passing.  Family was at the bedside.  Chaplain was available.  This chart was dictated using voice recognition software.  Despite best efforts to proofread,  errors can occur which can change the documentation meaning.    Ruthe Cornet, DO 07/06/2024 1412    Ruthe Cornet, DO 2024/07/06 1450    Ruthe Cornet, DO 2024/07/06 1510

## 2024-06-17 NOTE — Progress Notes (Addendum)
" °   06/11/24 0929  Spiritual Encounters  Type of Visit Initial  Care provided to: Family  Reason for visit End-of-life  OnCall Visit Yes   Chaplain responded to page from nurse for EOL. Chaplain visited Patient, who had been transitioned to comfort care, and Patient's daughter Rick) and later Patient's husband (Tim). Chaplain provided spiritual care and emotional support to Patient's daughter and husband. Upon request, Chaplain prayed with the Patient and her loved ones. Upon request, Chaplain will return to bedside when other family members arrive.   1115: Received call from nurse that family members had arrived. Chaplain returned to bedside of Patient and her family. Patient was extubated. Chaplain asked if she could say a prayer with the family and the family welcomed this invitation. Chaplain prayed with patient and her family as we surrounded the Patient's bedside. Chaplain was paged to go elsewhere but remains available upon request.  Chaplain Therisa Samuel  "

## 2024-06-17 NOTE — Progress Notes (Signed)
 Patient extubated to comfort per written order with RT, RN and family at bedside.

## 2024-06-17 NOTE — ED Provider Notes (Signed)
 " [place] [facility] Provider Note   CSN: [id number] Arrival date & time: 06-20-2024  9377     History Chief Complaint  Patient presents with   Unresponsive    HPI April Best is a 74 y.o. female presenting for unresponsiveness. EMS was called out for agonal respirations.  Last checked on by the family in the evening.  Nursing home facility states that she became gradually unresponsive throughout the night needing oxygen before ultimately dropping into agonal respirations this morning and EMS was called to bedside.  Patient was intubated in the field by EMS with immediate stabilization of respiratory status but blood pressure remains severely hypotensive on arrival. EMS reports that they called family who reported full scope of therapy.  Patient's recorded medical, surgical, social, medication list and allergies were reviewed in the Snapshot window as part of the initial history.   Review of Systems   Review of Systems  Unable to perform ROS: Patient unresponsive    Physical Exam Updated Vital Signs BP (!) 47/25   Pulse (!) 44   Temp (!) 96.8 F (36 C)   Resp (!) 8   Ht 4' 11 (1.499 m)   SpO2 (!) 40%   BMI 23.03 kg/m  Physical Exam Constitutional:      General: She is in acute distress.     Appearance: She is ill-appearing. She is not toxic-appearing.  HENT:     Head: Normocephalic and atraumatic.  Eyes:     Extraocular Movements: Extraocular movements intact.     Pupils: Pupils are equal, round, and reactive to light.  Cardiovascular:     Rate and Rhythm: Normal rate.  Pulmonary:     Effort: No respiratory distress.     Comments: Intubated.  Abdominal:     General: Abdomen is flat. There is distension.  Musculoskeletal:        General: No swelling, deformity or signs of injury.     Cervical back: Normal range of motion. No rigidity.  Skin:    General: Skin is warm.     Coloration: Skin is pale.  Neurological:      Mental Status: She is alert.     Comments: GCS 3  Psychiatric:        Mood and Affect: Mood normal.      ED Course/ Medical Decision Making/ A&P    Procedures .Critical Care  Performed by: Jerral Meth, MD Authorized by: Jerral Meth, MD   Critical care provider statement:    Critical care time (minutes):  95   Critical care was necessary to treat or prevent imminent or life-threatening deterioration of the following conditions:  Circulatory failure, respiratory failure and cardiac failure   Critical care was time spent personally by me on the following activities:  Development of treatment plan with patient or surrogate, discussions with consultants, evaluation of patient's response to treatment, examination of patient, ordering and review of laboratory studies, ordering and review of radiographic studies, ordering and performing treatments and interventions, pulse oximetry, re-evaluation of patient's condition and review of old charts   Care discussed with: admitting provider   Central Line  Performed by: Jerral Meth, MD Authorized by: Jerral Meth, MD   Consent:    Consent obtained:  Emergent situation   Risks, benefits, and alternatives were discussed: yes   Universal protocol:    Patient identity confirmed:  Verbally with patient and arm band Pre-procedure details:    Indication(s): central venous access, hemodynamic monitoring  and insufficient peripheral access     Hand hygiene: Hand hygiene performed prior to insertion     Sterile barrier technique: All elements of maximal sterile technique followed     Skin preparation:  Alcohol    Skin preparation agent: Skin preparation agent completely dried prior to procedure   Sedation:    Sedation type:  None Anesthesia:    Anesthesia method:  None Procedure details:    Location:  L internal jugular   Patient position:  Supine   Procedural supplies:  Triple lumen   Catheter size:  13 Fr   Landmarks  identified: yes     Ultrasound guidance: yes     Ultrasound guidance timing: real time     Sterile ultrasound techniques: Sterile gel and sterile probe covers were used     Number of attempts:  1   Successful placement: yes   Post-procedure details:    Post-procedure:  Dressing applied   Assessment:  Blood return through all ports   Procedure completion:  Tolerated    Medications Ordered in ED Medications  lactated ringers  bolus 1,000 mL (0 mLs Intravenous Stopped 2023/11/29 0749)  aztreonam  (AZACTAM ) 2 g in sodium chloride  0.9 % 100 mL IVPB (0 g Intravenous Stopped 2023/11/29 0810)  metroNIDAZOLE  (FLAGYL ) IVPB 500 mg (0 mg Intravenous Stopped 2023/11/29 0835)  vancomycin  (VANCOCIN ) IVPB 1000 mg/200 mL premix (0 mg Intravenous Stopped 2023/11/29 0842)  lactated ringers  bolus 2,000 mL (0 mLs Intravenous Stopped Jun 21, 2024 1101)  iohexol  (OMNIPAQUE ) 350 MG/ML injection 75 mL (75 mLs Intravenous Contrast Given Jun 21, 2024 0811)    Medical Decision Making:   This is a patient arriving in near arrest condition.  I was called immediately to bedside for resuscitation. Intubated in the field with stabilization of her respiratory function but grossly hypotensive on arrival.  She is currently GCS 3 T without sedation. Very poor prognosis based on this presentation. Family is coming back to bedside but EMS has confirmed full scope of therapy.  Will attempt to improve patient's perfusion with vasopressors.  She had no response to peripheral vasopressors likely due to expected acidemia.  Given this condition, emergent placement of left IJ central line for direct infusion of vasopressors at the central vascular system.  This led to immediate stabilization of blood pressure after administration of norepinephrine . At this point, I was able to discuss patient's care with the family who are in the consult room. While she is immediately stabilized, her lactic acid resulted at 13 indicating a very poor prognosis.  Family  based on this prognosis elected to go DNR at this time.  They want to continue with current therapies to allow the rest of the family time to come and see the patient before she passes but do not want any further pain inducing interventions. Ultimately, patient's etiology is most consistent with developing septic shock, uncertain etiology given lack of a prodrome in an immunocompromised chemotherapy patient but bacteremia expected given rapid nature of deterioration. She was broadly treated with vancomycin  and cefepime while being stabilized. Blood culture sent to lab. Family coming to bedside currently aware of patient's critical condition and discussing progressing to comfort care status at time of handoff to oncoming team.  They were updated on the plan of care with a warm handoff at bedside.  Plan for ICU consultation following CT.   Clinical Impression:  1. Shock (HCC)   2. Acute respiratory failure with hypoxia (HCC)   3. AKI (acute kidney injury)      Expired  Final Clinical Impression(s) / ED Diagnoses Final diagnoses:  Shock (HCC)  Acute respiratory failure with hypoxia (HCC)  AKI (acute kidney injury)    Rx / DC Orders ED Discharge Orders     None         Jerral Meth, MD 06/11/2024 2321  "

## 2024-06-17 NOTE — ED Notes (Signed)
 RT at bedside. Pt extubated at this time.

## 2024-06-17 NOTE — ED Triage Notes (Signed)
 Pt bibgcems from [facility], apneic and unresponsive upon ems arrival with agonal breathing. 7.0 ett placed. Epi running at 6 mcg/min, 100 mcg fent, 5mg  versed , 500ml fluid given with ems.  Bp 52 palpated  99% bvm  Cbg 111

## 2024-06-17 NOTE — ED Notes (Signed)
5 family members at bedside

## 2024-06-17 NOTE — ED Notes (Signed)
 1440- Pt was pronounced deceased by this RN and Camie PEAK. Both RN's listened for a full minute.

## 2024-06-17 NOTE — Progress Notes (Signed)
 Elink following for sepsis protocol.

## 2024-06-17 NOTE — Progress Notes (Signed)
 Patient transported from ED Trauma B to CT and back to Trauma B with RT and RN at bedside. No complications noted.

## 2024-06-17 DEATH — deceased

## 2024-06-21 ENCOUNTER — Inpatient Hospital Stay

## 2024-06-21 ENCOUNTER — Inpatient Hospital Stay: Admitting: Internal Medicine

## 2024-06-22 ENCOUNTER — Inpatient Hospital Stay

## 2024-06-22 ENCOUNTER — Inpatient Hospital Stay: Admitting: Internal Medicine

## 2024-06-23 ENCOUNTER — Inpatient Hospital Stay

## 2024-06-24 ENCOUNTER — Inpatient Hospital Stay

## 2024-06-25 ENCOUNTER — Inpatient Hospital Stay

## 2024-07-30 ENCOUNTER — Other Ambulatory Visit

## 2024-08-06 ENCOUNTER — Ambulatory Visit: Admitting: Internal Medicine

## 2024-08-06 ENCOUNTER — Encounter

## 2024-08-23 ENCOUNTER — Encounter (INDEPENDENT_AMBULATORY_CARE_PROVIDER_SITE_OTHER)

## 2024-08-23 ENCOUNTER — Ambulatory Visit (INDEPENDENT_AMBULATORY_CARE_PROVIDER_SITE_OTHER): Admitting: Vascular Surgery

## 2024-10-13 ENCOUNTER — Ambulatory Visit

## 2025-01-24 ENCOUNTER — Encounter: Admitting: Family Medicine
